# Patient Record
Sex: Male | Born: 1950 | Race: White | Hispanic: No | Marital: Married | Smoking: Never smoker
Health system: Southern US, Community
[De-identification: ages and names within clinical notes are randomized; demographics above are authoritative.]

## PROBLEM LIST (undated history)

## (undated) DIAGNOSIS — F329 Major depressive disorder, single episode, unspecified: Secondary | ICD-10-CM

## (undated) DIAGNOSIS — J449 Chronic obstructive pulmonary disease, unspecified: Secondary | ICD-10-CM

## (undated) DIAGNOSIS — I255 Ischemic cardiomyopathy: Secondary | ICD-10-CM

## (undated) DIAGNOSIS — R739 Hyperglycemia, unspecified: Secondary | ICD-10-CM

## (undated) DIAGNOSIS — I219 Acute myocardial infarction, unspecified: Secondary | ICD-10-CM

## (undated) DIAGNOSIS — T8859XA Other complications of anesthesia, initial encounter: Secondary | ICD-10-CM

## (undated) DIAGNOSIS — T4145XA Adverse effect of unspecified anesthetic, initial encounter: Secondary | ICD-10-CM

## (undated) DIAGNOSIS — E669 Obesity, unspecified: Secondary | ICD-10-CM

## (undated) DIAGNOSIS — I209 Angina pectoris, unspecified: Secondary | ICD-10-CM

## (undated) DIAGNOSIS — M1990 Unspecified osteoarthritis, unspecified site: Secondary | ICD-10-CM

## (undated) DIAGNOSIS — I4510 Unspecified right bundle-branch block: Secondary | ICD-10-CM

## (undated) DIAGNOSIS — F32A Depression, unspecified: Secondary | ICD-10-CM

## (undated) DIAGNOSIS — Z8679 Personal history of other diseases of the circulatory system: Secondary | ICD-10-CM

## (undated) DIAGNOSIS — R001 Bradycardia, unspecified: Secondary | ICD-10-CM

## (undated) DIAGNOSIS — G4733 Obstructive sleep apnea (adult) (pediatric): Secondary | ICD-10-CM

## (undated) DIAGNOSIS — Z7902 Long term (current) use of antithrombotics/antiplatelets: Secondary | ICD-10-CM

## (undated) DIAGNOSIS — I10 Essential (primary) hypertension: Secondary | ICD-10-CM

## (undated) DIAGNOSIS — G4730 Sleep apnea, unspecified: Secondary | ICD-10-CM

## (undated) DIAGNOSIS — D649 Anemia, unspecified: Secondary | ICD-10-CM

## (undated) DIAGNOSIS — I2510 Atherosclerotic heart disease of native coronary artery without angina pectoris: Secondary | ICD-10-CM

## (undated) DIAGNOSIS — C4491 Basal cell carcinoma of skin, unspecified: Secondary | ICD-10-CM

## (undated) DIAGNOSIS — E876 Hypokalemia: Secondary | ICD-10-CM

## (undated) DIAGNOSIS — Z87442 Personal history of urinary calculi: Secondary | ICD-10-CM

## (undated) DIAGNOSIS — E785 Hyperlipidemia, unspecified: Secondary | ICD-10-CM

## (undated) DIAGNOSIS — I509 Heart failure, unspecified: Secondary | ICD-10-CM

## (undated) DIAGNOSIS — H409 Unspecified glaucoma: Secondary | ICD-10-CM

## (undated) DIAGNOSIS — N400 Enlarged prostate without lower urinary tract symptoms: Secondary | ICD-10-CM

## (undated) HISTORY — DX: Major depressive disorder, single episode, unspecified: F32.9

## (undated) HISTORY — PX: CORONARY ANGIOPLASTY: SHX604

## (undated) HISTORY — DX: Depression, unspecified: F32.A

## (undated) HISTORY — DX: Basal cell carcinoma of skin, unspecified: C44.91

## (undated) HISTORY — PX: JOINT REPLACEMENT: SHX530

## (undated) HISTORY — PX: LAPAROSCOPIC GASTRIC RESTRICTIVE DUODENAL PROCEDURE (DUODENAL SWITCH): SHX6667

## (undated) HISTORY — DX: Sleep apnea, unspecified: G47.30

## (undated) HISTORY — PX: CARDIAC CATHETERIZATION: SHX172

## (undated) HISTORY — DX: Unspecified glaucoma: H40.9

## (undated) HISTORY — PX: OTHER SURGICAL HISTORY: SHX169

## (undated) HISTORY — DX: Essential (primary) hypertension: I10

## (undated) HISTORY — DX: Acute myocardial infarction, unspecified: I21.9

---
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
## 2006-07-09 DIAGNOSIS — I10 Essential (primary) hypertension: Secondary | ICD-10-CM | POA: Insufficient documentation

## 2006-07-09 DIAGNOSIS — R0902 Hypoxemia: Secondary | ICD-10-CM | POA: Insufficient documentation

## 2008-11-17 ENCOUNTER — Ambulatory Visit: Payer: Self-pay | Admitting: Specialist

## 2008-12-02 ENCOUNTER — Ambulatory Visit: Payer: Self-pay | Admitting: Specialist

## 2010-07-25 ENCOUNTER — Ambulatory Visit: Payer: Self-pay | Admitting: Family Medicine

## 2012-02-04 HISTORY — PX: CORONARY STENT PLACEMENT: SHX1402

## 2012-02-26 ENCOUNTER — Emergency Department: Payer: Self-pay | Admitting: Emergency Medicine

## 2012-02-26 DIAGNOSIS — I272 Pulmonary hypertension, unspecified: Secondary | ICD-10-CM

## 2012-02-26 DIAGNOSIS — I213 ST elevation (STEMI) myocardial infarction of unspecified site: Secondary | ICD-10-CM

## 2012-02-26 DIAGNOSIS — I10 Essential (primary) hypertension: Secondary | ICD-10-CM | POA: Insufficient documentation

## 2012-02-26 DIAGNOSIS — J449 Chronic obstructive pulmonary disease, unspecified: Secondary | ICD-10-CM | POA: Insufficient documentation

## 2012-02-26 DIAGNOSIS — Z8679 Personal history of other diseases of the circulatory system: Secondary | ICD-10-CM

## 2012-02-26 DIAGNOSIS — I2510 Atherosclerotic heart disease of native coronary artery without angina pectoris: Secondary | ICD-10-CM | POA: Insufficient documentation

## 2012-02-26 HISTORY — DX: ST elevation (STEMI) myocardial infarction of unspecified site: I21.3

## 2012-02-26 HISTORY — DX: Personal history of other diseases of the circulatory system: Z86.79

## 2012-02-26 HISTORY — DX: Pulmonary hypertension, unspecified: I27.20

## 2012-02-26 LAB — COMPREHENSIVE METABOLIC PANEL
Albumin: 3.5 g/dL (ref 3.4–5.0)
Anion Gap: 8 (ref 7–16)
BUN: 19 mg/dL — ABNORMAL HIGH (ref 7–18)
Bilirubin,Total: 0.3 mg/dL (ref 0.2–1.0)
Calcium, Total: 8.9 mg/dL (ref 8.5–10.1)
Chloride: 94 mmol/L — ABNORMAL LOW (ref 98–107)
Co2: 37 mmol/L — ABNORMAL HIGH (ref 21–32)
Creatinine: 0.68 mg/dL (ref 0.60–1.30)
EGFR (African American): >60
EGFR (Non-African Amer.): >60
Glucose: 139 mg/dL — ABNORMAL HIGH (ref 65–99)
Osmolality: 282 (ref 275–301)
Potassium: 2.8 mmol/L — ABNORMAL LOW (ref 3.5–5.1)
SGPT (ALT): 27 U/L (ref 12–78)
Sodium: 139 mmol/L (ref 136–145)
Total Protein: 8.3 g/dL — ABNORMAL HIGH (ref 6.4–8.2)

## 2012-02-26 LAB — TROPONIN I: Troponin-I: <0.02 ng/mL

## 2012-02-26 LAB — CBC
HCT: 44.9 % (ref 40.0–52.0)
HGB: 14.7 g/dL (ref 13.0–18.0)
MCH: 28.1 pg (ref 26.0–34.0)
MCHC: 32.7 g/dL (ref 32.0–36.0)
MCV: 86 fL (ref 80–100)
RDW: 14 % (ref 11.5–14.5)
WBC: 11.7 10*3/uL — ABNORMAL HIGH (ref 3.8–10.6)

## 2012-03-14 DIAGNOSIS — I4901 Ventricular fibrillation: Secondary | ICD-10-CM | POA: Insufficient documentation

## 2012-03-14 DIAGNOSIS — M1990 Unspecified osteoarthritis, unspecified site: Secondary | ICD-10-CM | POA: Insufficient documentation

## 2012-03-14 DIAGNOSIS — H409 Unspecified glaucoma: Secondary | ICD-10-CM | POA: Insufficient documentation

## 2012-04-04 ENCOUNTER — Ambulatory Visit: Payer: Self-pay

## 2012-06-04 ENCOUNTER — Emergency Department: Payer: Self-pay | Admitting: Emergency Medicine

## 2012-06-04 LAB — COMPREHENSIVE METABOLIC PANEL
Alkaline Phosphatase: 69 U/L (ref 50–136)
Anion Gap: 4 — ABNORMAL LOW (ref 7–16)
BUN: 16 mg/dL (ref 7–18)
Bilirubin,Total: 0.3 mg/dL (ref 0.2–1.0)
Calcium, Total: 8.5 mg/dL (ref 8.5–10.1)
Chloride: 105 mmol/L (ref 98–107)
Creatinine: 0.62 mg/dL (ref 0.60–1.30)
EGFR (African American): >60
Osmolality: 281 (ref 275–301)
Potassium: 4 mmol/L (ref 3.5–5.1)
SGPT (ALT): 20 U/L (ref 12–78)
Sodium: 140 mmol/L (ref 136–145)
Total Protein: 7 g/dL (ref 6.4–8.2)

## 2012-06-04 LAB — CBC
MCH: 28.8 pg (ref 26.0–34.0)
MCHC: 33.1 g/dL (ref 32.0–36.0)
Platelet: 206 10*3/uL (ref 150–440)
RBC: 4.52 10*6/uL (ref 4.40–5.90)
RDW: 14.4 % (ref 11.5–14.5)
WBC: 7.8 10*3/uL (ref 3.8–10.6)

## 2012-06-04 LAB — URINALYSIS, COMPLETE
Glucose,UR: NEGATIVE mg/dL (ref 0–75)
Leukocyte Esterase: NEGATIVE
Nitrite: NEGATIVE
Ph: 5 (ref 4.5–8.0)
Protein: 30
RBC,UR: 1 /HPF (ref 0–5)
WBC UR: 2 /HPF (ref 0–5)

## 2012-07-31 ENCOUNTER — Other Ambulatory Visit: Payer: Self-pay | Admitting: Pain Medicine

## 2012-07-31 ENCOUNTER — Ambulatory Visit: Payer: Self-pay | Admitting: Pain Medicine

## 2012-07-31 LAB — BASIC METABOLIC PANEL
Anion Gap: 5 — ABNORMAL LOW (ref 7–16)
Calcium, Total: 8.8 mg/dL (ref 8.5–10.1)
Creatinine: 0.46 mg/dL — ABNORMAL LOW (ref 0.60–1.30)
EGFR (Non-African Amer.): >60
Osmolality: 274 (ref 275–301)
Sodium: 136 mmol/L (ref 136–145)

## 2012-07-31 LAB — SEDIMENTATION RATE: Erythrocyte Sed Rate: 15 mm/hr (ref 0–20)

## 2012-08-15 ENCOUNTER — Ambulatory Visit: Payer: Self-pay | Admitting: Pain Medicine

## 2012-09-02 ENCOUNTER — Ambulatory Visit: Payer: Self-pay | Admitting: Pain Medicine

## 2012-11-13 ENCOUNTER — Ambulatory Visit: Payer: Self-pay | Admitting: Internal Medicine

## 2013-01-21 ENCOUNTER — Ambulatory Visit: Payer: Self-pay | Admitting: Specialist

## 2013-01-21 LAB — CBC WITH DIFFERENTIAL/PLATELET
Basophil #: 0.1 x10 3/mm 3
Basophil %: 0.9 %
Eosinophil #: 0.2 x10 3/mm 3
Eosinophil %: 1.9 %
HCT: 41.3 %
HGB: 14.1 g/dL
Lymphocyte %: 20.9 %
Lymphs Abs: 2 x10 3/mm 3
MCH: 28.9 pg
MCHC: 34 g/dL
MCV: 85 fL
Monocyte #: 0.7 "x10 3/mm "
Monocyte %: 7.1 %
Neutrophil #: 6.7 x10 3/mm 3 — ABNORMAL HIGH
Neutrophil %: 69.2 %
Platelet: 254 x10 3/mm 3
RBC: 4.87 x10 6/mm 3
RDW: 14.4 %
WBC: 9.7 x10 3/mm 3

## 2013-01-21 LAB — PROTIME-INR: INR: 1

## 2013-01-21 LAB — COMPREHENSIVE METABOLIC PANEL
BUN: 9 mg/dL (ref 7–18)
Bilirubin,Total: 0.4 mg/dL (ref 0.2–1.0)
Chloride: 101 mmol/L (ref 98–107)
Creatinine: 0.49 mg/dL — ABNORMAL LOW (ref 0.60–1.30)
Glucose: 98 mg/dL (ref 65–99)
Potassium: 3.9 mmol/L (ref 3.5–5.1)

## 2013-01-21 LAB — IRON AND TIBC
Iron Bind.Cap.(Total): 314 ug/dL (ref 250–450)
Unbound Iron-Bind.Cap.: 255 ug/dL

## 2013-01-21 LAB — PHOSPHORUS: Phosphorus: 3.5 mg/dL

## 2013-01-21 LAB — TSH: Thyroid Stimulating Horm: 2.16 u[IU]/mL

## 2013-01-21 LAB — HEMOGLOBIN A1C: Hemoglobin A1C: 5.7 %

## 2013-01-21 LAB — FOLATE: Folic Acid: 7.8 ng/mL

## 2013-01-21 LAB — AMYLASE: Amylase: 35 U/L (ref 25–115)

## 2013-01-21 LAB — BILIRUBIN, DIRECT: Bilirubin, Direct: 0.1 mg/dL (ref 0.00–0.20)

## 2013-01-21 LAB — APTT: Activated PTT: 28.5 secs (ref 23.6–35.9)

## 2013-02-06 ENCOUNTER — Ambulatory Visit: Payer: Self-pay | Admitting: Specialist

## 2013-03-05 ENCOUNTER — Ambulatory Visit: Payer: Self-pay | Admitting: Specialist

## 2013-08-21 HISTORY — PX: LAPAROSCOPIC GASTRIC SLEEVE RESECTION: SHX5895

## 2013-09-10 ENCOUNTER — Ambulatory Visit: Payer: Self-pay | Admitting: Specialist

## 2013-10-03 ENCOUNTER — Ambulatory Visit: Payer: Self-pay | Admitting: Specialist

## 2013-11-25 ENCOUNTER — Ambulatory Visit: Payer: Self-pay | Admitting: Gastroenterology

## 2014-07-08 DIAGNOSIS — E669 Obesity, unspecified: Secondary | ICD-10-CM | POA: Insufficient documentation

## 2014-07-08 DIAGNOSIS — Z9884 Bariatric surgery status: Secondary | ICD-10-CM | POA: Insufficient documentation

## 2014-08-31 ENCOUNTER — Encounter
Admit: 2014-08-31 | Disposition: A | Discharge status: Home / Self Care | Payer: Self-pay | Attending: Family Medicine | Admitting: Family Medicine

## 2014-09-04 ENCOUNTER — Encounter
Admit: 2014-09-04 | Disposition: A | Discharge status: Home / Self Care | Payer: Self-pay | Attending: Family Medicine | Admitting: Family Medicine

## 2014-11-30 DIAGNOSIS — I252 Old myocardial infarction: Secondary | ICD-10-CM | POA: Insufficient documentation

## 2014-11-30 DIAGNOSIS — M546 Pain in thoracic spine: Secondary | ICD-10-CM | POA: Insufficient documentation

## 2014-11-30 DIAGNOSIS — Z8659 Personal history of other mental and behavioral disorders: Secondary | ICD-10-CM | POA: Insufficient documentation

## 2014-11-30 DIAGNOSIS — J449 Chronic obstructive pulmonary disease, unspecified: Secondary | ICD-10-CM | POA: Insufficient documentation

## 2014-11-30 DIAGNOSIS — E668 Other obesity: Secondary | ICD-10-CM | POA: Insufficient documentation

## 2014-11-30 DIAGNOSIS — M179 Osteoarthritis of knee, unspecified: Secondary | ICD-10-CM | POA: Insufficient documentation

## 2014-11-30 DIAGNOSIS — M545 Low back pain, unspecified: Secondary | ICD-10-CM | POA: Insufficient documentation

## 2014-11-30 DIAGNOSIS — E669 Obesity, unspecified: Secondary | ICD-10-CM | POA: Insufficient documentation

## 2014-11-30 DIAGNOSIS — E785 Hyperlipidemia, unspecified: Secondary | ICD-10-CM | POA: Insufficient documentation

## 2014-11-30 DIAGNOSIS — J42 Unspecified chronic bronchitis: Secondary | ICD-10-CM | POA: Insufficient documentation

## 2014-11-30 DIAGNOSIS — M1710 Unilateral primary osteoarthritis, unspecified knee: Secondary | ICD-10-CM | POA: Insufficient documentation

## 2014-11-30 DIAGNOSIS — B356 Tinea cruris: Secondary | ICD-10-CM | POA: Insufficient documentation

## 2014-12-01 ENCOUNTER — Encounter: Payer: Self-pay | Admitting: Family Medicine

## 2014-12-01 ENCOUNTER — Ambulatory Visit (INDEPENDENT_AMBULATORY_CARE_PROVIDER_SITE_OTHER): Payer: Medicare Other | Admitting: Family Medicine

## 2014-12-01 VITALS — BP 178/74 | HR 64 | Temp 98.2°F | Resp 16 | Ht 67.0 in | Wt 310.0 lb

## 2014-12-01 DIAGNOSIS — I2510 Atherosclerotic heart disease of native coronary artery without angina pectoris: Secondary | ICD-10-CM | POA: Diagnosis not present

## 2014-12-01 DIAGNOSIS — J449 Chronic obstructive pulmonary disease, unspecified: Secondary | ICD-10-CM | POA: Diagnosis not present

## 2014-12-01 DIAGNOSIS — I252 Old myocardial infarction: Secondary | ICD-10-CM

## 2014-12-01 DIAGNOSIS — G4733 Obstructive sleep apnea (adult) (pediatric): Secondary | ICD-10-CM

## 2014-12-01 DIAGNOSIS — I4901 Ventricular fibrillation: Secondary | ICD-10-CM | POA: Diagnosis not present

## 2014-12-01 DIAGNOSIS — M1711 Unilateral primary osteoarthritis, right knee: Secondary | ICD-10-CM

## 2014-12-01 DIAGNOSIS — Z9989 Dependence on other enabling machines and devices: Secondary | ICD-10-CM

## 2014-12-01 DIAGNOSIS — Z9884 Bariatric surgery status: Secondary | ICD-10-CM | POA: Diagnosis not present

## 2014-12-01 NOTE — Progress Notes (Signed)
Subjective:    Patient ID: William Armstrong, male    DOB: 09-Jun-1950, 64 y.o.   MRN: [id number]  HPI Pt is here for a face to face for mobility examination and paper work to filled out regarding his wheel chair. He needs a new wheel chair because the one he has is 66 years old and is too big for him since he has lost 150lbs. Unable to walk very much due to severe osteoarthritis caused by long term severe obesity. Unadvisable to have orthopedic surgery due to COPD and severe obesity with hypoxia. Breathing is improved with weight loss. Has a history of MI September 2013 with stents by cardiologist (Dr. Laurance Flatten in [place]). Recent follow up with cardiologist showed improvement and he discontinued the Metoprolol. Ambulation further hindered by redundant loose skin from significant weight loss. Patient Active Problem List   Diagnosis Date Noted  . Chronic bronchitis 11/30/2014  . COPD, moderate 11/30/2014  . H/O: depression 11/30/2014  . Old myocardial infarction 11/30/2014  . HLD (hyperlipidemia) 11/30/2014  . Low back pain 11/30/2014  . Extreme obesity 11/30/2014  . Arthritis of knee, degenerative 11/30/2014  . Back pain, thoracic 11/30/2014  . Dermatophytosis of groin 11/30/2014  . Adiposity 07/08/2014  . Bariatric surgery status 07/08/2014  . Glaucoma 03/14/2012  . Arthritis, degenerative 03/14/2012  . Ventricular fibrillation 03/14/2012  . Arteriosclerosis of coronary artery 02/26/2012  . CAFL (chronic airflow limitation) 02/26/2012  . BP (high blood pressure) 02/26/2012  . Essential (primary) hypertension 07/09/2006  . Open-angle glaucoma 07/09/2006  . Hypoxemia 07/09/2006  . Obstructive apnea 07/09/2006   Past Surgical History  Procedure Laterality Date  . Tonsillectomy and adenoidectomy  1960  . Laparoscopic gastric sleeve resection  08/21/2013  . Coronary stent placement  02/2012   History  Substance Use Topics  . Smoking status: Never Smoker   . Smokeless tobacco: Not on file   . Alcohol Use: 0.0 oz/week    0 Standard drinks or equivalent per week     Comment: OCCASIONALLY   Family History  Problem Relation Age of Onset  . Heart attack Mother   . Brain cancer Father   . Heart attack Sister   . Congenital heart disease Sister   . Leukemia Paternal Grandmother   . COPD Brother   . Heart disease Brother    Current Outpatient Prescriptions on File Prior to Visit  Medication Sig Dispense Refill  . Aspirin 81 MG EC tablet 1 tablet daily.    . bimatoprost (LUMIGAN) 0.01 % SOLN Apply to eye.    . Cholecalciferol (VITAMIN D) 2000 UNITS tablet Take 1 tablet by mouth daily.    . clotrimazole-betamethasone (LOTRISONE) cream 1 application 2 (two) times daily.    . dorzolamide (TRUSOPT) 2 % ophthalmic solution Apply 1 drop to eye 3 (three) times daily.    . rosuvastatin (CRESTOR) 10 MG tablet Take 1 tablet by mouth daily.    . sertraline (ZOLOFT) 100 MG tablet Take 1 tablet by mouth daily.     No current facility-administered medications on file prior to visit.   Allergies  Allergen Reactions  . Atorvastatin Other (See Comments)  . Pregabalin Other (See Comments)   Review of Systems  Constitutional: Negative.        Morbid obesity and poor activity level.  HENT: Negative.   Eyes: Negative.        Continues eye drops for glaucoma and well controlled.  Respiratory: Positive for shortness of breath. Negative for cough and  wheezing.        Occurs with any attempt to walk and controlled hypoxia with oxygen at 2LPM during the day and BiPAP with oxygen bleed in at 2LPM nightly.  Cardiovascular: Negative.   Gastrointestinal: Negative.   Genitourinary: Negative.   Musculoskeletal: Positive for arthralgias.       Persistent right knee pain due to severe degenerative disease from severe morbid obesity.  Neurological: Negative.   Hematological: Negative.   Psychiatric/Behavioral: Negative.       BP 178/74 mmHg  Pulse 64  Temp(Src) 98.2 F (36.8 C) (Oral)  Resp  16  Wt 310 lb (140.615 kg) Body mass index is 48.54 kg/(m^2).  Objective:   Physical Exam  Constitutional:  Severe morbid obesity.  HENT:  Head: Normocephalic and atraumatic.  Right Ear: External ear normal.  Left Ear: External ear normal.  Nose: Nose normal.  Mouth/Throat: Oropharynx is clear and moist.  Eyes: Conjunctivae and EOM are normal. Pupils are equal, round, and reactive to light.  Neck: Normal range of motion. Neck supple.  Cardiovascular: Normal rate, regular rhythm and normal heart sounds.   Pulmonary/Chest: Breath sounds normal.  On oxygen at 2 LPM by nasal cannula day and night  Abdominal: Soft. Bowel sounds are normal.  Morbidly obese  Musculoskeletal: He exhibits tenderness.  Left knee has some tenderness with crepitus. Difficult to fully evaluate due to obesity. Decreased full flexion. Sharper and more intense pain with weight bearing.  Neurological: He is alert.  Skin: Skin is warm and dry.  Psychiatric: He has a normal mood and affect. His behavior is normal. Thought content normal.      Assessment & Plan:  1. Primary osteoarthritis of right knee Unable to walk much more than trips to the bathroom and getting into his vehicle. Continues to use wheelchair for all ambulation but difficult due to weight loss making chair too big now. Needs repair or replacement. Will send report and forms to [employer]. Completed Disability [employer] application form in March 2016. Mobility related activity limitations include inability to walk through the house to go to the bathroom without motorized chair. Morbid obesity makes it impossible to use a manual wheelchair. Cannot use scooter in the home and has had a motorized chair for years. He is mentally and physically able and motivated to use powerized wheelchair.  2. COPD, moderate Diagnosed in 2003 with acute hypoxia and pulse oximetry 80%. Still on 2 LPM during the day and 2 LPM bleed-in through BiPAP at night. Pulse  oximetry 97% with oxygen initially - dropped to 95% at rest without oxygen - could only walk a very short distance due to obesity and pulse elevation to 171 with pulse oximetry drop to 91% without oxygen - rebounded back to 94% after walking and oxygen. Recommend continuation of oxygen at 2 LPM and BiPAP with 2 LPM bleed-in at night. Recheck in 3 months.  3. OSA on CPAP Energy level improved with BiPAP with 2 LPM oxygen bleed-in each night. Not having a great deal of daytime sleepiness now.  4. Old myocardial infarction Had STEMI 02-26-12. Had 2 stents placed in the RCA foe 99% occlusion. No recent chest pain with weight loss and oxygen use.  5. Ventricular fibrillation Occurred  02-26-12 at [facility] during cardiac cath for stents. Required defibrillation 3 times and was treated with Amiodarone during the hospitalization only. No recurrences and followed by Dr. Laurance Flatten (cardiologist).  6. Arteriosclerosis of coronary artery No recurrence of angina. Dyspnea due to severe obesity  and COPD with chronic hypoxia. History of CHF that has improved with weight loss and treatment of OSA. Follow up with cardiologist (Dr. Laurance Flatten) routinely as planned.  7. Bariatric surgery status Had gastric sleeve surgery 08-21-13. Maximum weight was 460 lbs at time of MI on 02-26-12. Has lost 140 lbs and goal is <250 lbs. Orthopedist will not operate on the left knee (which severely limits mobility) until weight loss goal is achieved.

## 2014-12-04 ENCOUNTER — Telehealth: Payer: Self-pay | Admitting: Family Medicine

## 2014-12-10 NOTE — Telephone Encounter (Signed)
Pt's wife Lorre Nick called back about the forms for the power wheelchair. Thanks TNP

## 2014-12-10 NOTE — Telephone Encounter (Signed)
Pt's wife Lorre Nick would like a call back because she would like an update on the forms they need filled out to get a new power wheelchair. Lorre Nick wanted to know if they were ready or when they might be ready. Thanks TNP

## 2014-12-11 ENCOUNTER — Telehealth: Payer: Self-pay | Admitting: Family Medicine

## 2014-12-11 NOTE — Telephone Encounter (Signed)
Left patient a voicemail informing him that the forms have been faxed back to [employer].

## 2014-12-11 NOTE — Telephone Encounter (Signed)
Pt's wife Lorre Nick called because she was advised that we had faxed the forms for the wheelchair to [employer] Fax# [phone] but she spoke with that office and was advised that they didn't receive the fax. Chrys Racer found the forms that were faxed on 12/10/14 and is going to fax them again. Lorre Nick was advised and request that she be able to pick up a copy so she can take them to [employer] just in case they do not get the fax. Thanks TNP Thanks TNP

## 2014-12-11 NOTE — Telephone Encounter (Signed)
Advise patient (or wife) that forms and therapist report was faxed to [employer] regarding power chair on 12-10-14.

## 2014-12-15 ENCOUNTER — Telehealth: Payer: Self-pay | Admitting: Family Medicine

## 2014-12-15 NOTE — Telephone Encounter (Signed)
Contacted Brooke with [employer] and she stated that she received the paper work for patient's wheel chair but one sheet was missing a signature. Jerene Pitch will fax that page back to be signed by Simona Huh.

## 2014-12-15 NOTE — Telephone Encounter (Signed)
Brooke with [employer] is requesting a call back to discuss info about pt power wheel chair.  BR#[phone]/FT

## 2014-12-20 ENCOUNTER — Encounter: Payer: Self-pay | Admitting: Family Medicine

## 2014-12-22 ENCOUNTER — Telehealth: Payer: Self-pay | Admitting: Family Medicine

## 2014-12-22 NOTE — Telephone Encounter (Signed)
Brooke calling in regards to the order for his power wheel chair from [employer].  Could some one please call her aback at [phone].   Thanks, Con Memos

## 2014-12-24 ENCOUNTER — Encounter: Payer: Self-pay | Admitting: Family Medicine

## 2014-12-24 ENCOUNTER — Ambulatory Visit (INDEPENDENT_AMBULATORY_CARE_PROVIDER_SITE_OTHER): Payer: Medicare Other | Admitting: Family Medicine

## 2014-12-24 VITALS — BP 136/70 | HR 108 | Temp 98.0°F | Resp 16 | Ht 67.5 in | Wt 305.0 lb

## 2014-12-24 DIAGNOSIS — I2510 Atherosclerotic heart disease of native coronary artery without angina pectoris: Secondary | ICD-10-CM

## 2014-12-24 DIAGNOSIS — J449 Chronic obstructive pulmonary disease, unspecified: Secondary | ICD-10-CM | POA: Diagnosis not present

## 2014-12-24 DIAGNOSIS — G4733 Obstructive sleep apnea (adult) (pediatric): Secondary | ICD-10-CM | POA: Diagnosis not present

## 2014-12-24 NOTE — Progress Notes (Addendum)
Subjective:    Patient ID: William Armstrong, male    DOB: Jul 07, 1950, 64 y.o.   MRN: [id number] Chief Complaint  Patient presents with  . Follow-up    Oxigen , currently on 2 - 3 L of O2    HPI  This 64 year old male with extreme obesity, COPD with history of OSA and CAD with episode of V.fib during stent placement is concerned about last pulse oximetry reading being so good he may not qualify to continue oxygen therapy 2-3 LPM with bleed-in on CPAP. Wants to recheck oxygen levels with and with out oxygen supplement at rest and with walking. Very difficult to walk with his extreme obesity and severe osteoarthritis in the right knee. Physical activities have been hindered for years and requiring motorized wheel chair usage. Endurance is very poor.  No past medical history on file. Patient Active Problem List   Diagnosis Date Noted  . Chronic bronchitis 11/30/2014  . COPD, moderate 11/30/2014  . H/O: depression 11/30/2014  . Old myocardial infarction 11/30/2014  . HLD (hyperlipidemia) 11/30/2014  . Low back pain 11/30/2014  . Extreme obesity 11/30/2014  . Arthritis of knee, degenerative 11/30/2014  . Back pain, thoracic 11/30/2014  . Dermatophytosis of groin 11/30/2014  . Adiposity 07/08/2014  . Bariatric surgery status 07/08/2014  . Glaucoma 03/14/2012  . Arthritis, degenerative 03/14/2012  . Ventricular fibrillation 03/14/2012  . Arteriosclerosis of coronary artery 02/26/2012  . CAFL (chronic airflow limitation) 02/26/2012  . BP (high blood pressure) 02/26/2012  . Essential (primary) hypertension 07/09/2006  . Open-angle glaucoma 07/09/2006  . Hypoxemia 07/09/2006  . Obstructive apnea 07/09/2006   Past Surgical History  Procedure Laterality Date  . Tonsillectomy and adenoidectomy  1960  . Laparoscopic gastric sleeve resection  08/21/2013  . Coronary stent placement  02/2012   History  Substance Use Topics  . Smoking status: Never Smoker   . Smokeless tobacco: Not on file   . Alcohol Use: 0.0 oz/week    0 Standard drinks or equivalent per week     Comment: OCCASIONALLY   Family History  Problem Relation Age of Onset  . Heart attack Mother   . Brain cancer Father   . Heart attack Sister   . Congenital heart disease Sister   . Leukemia Paternal Grandmother   . COPD Brother   . Heart disease Brother    Current Outpatient Prescriptions on File Prior to Visit  Medication Sig Dispense Refill  . Aspirin 81 MG EC tablet 1 tablet daily.    . bimatoprost (LUMIGAN) 0.01 % SOLN Apply to eye.    . Cholecalciferol (VITAMIN D) 2000 UNITS tablet Take 1 tablet by mouth daily.    . clotrimazole-betamethasone (LOTRISONE) cream 1 application 2 (two) times daily.    . dorzolamide (TRUSOPT) 2 % ophthalmic solution Apply 1 drop to eye 3 (three) times daily.    . rosuvastatin (CRESTOR) 10 MG tablet Take 1 tablet by mouth daily.    . sertraline (ZOLOFT) 100 MG tablet Take 1 tablet by mouth daily.     No current facility-administered medications on file prior to visit.   Allergies  Allergen Reactions  . Atorvastatin Other (See Comments)  . Pregabalin Other (See Comments)    Review of Systems  Constitutional: Negative.   HENT: Negative.   Eyes: Negative.   Respiratory: Positive for shortness of breath.   Cardiovascular: Negative.   Gastrointestinal: Negative.   Musculoskeletal: Positive for arthralgias.       Severe pain  in the right knee with trying to bear weight and flexing.     BP 136/70 mmHg  Pulse 66  Temp(Src) 98 F (36.7 C) (Oral)  Resp 16  Ht 5' 7.5" (1.715 m)  Wt 305 lb (138.347 kg)  BMI 47.04 kg/m2  SpO2 95%  Objective:   Physical Exam  Constitutional: He is oriented to person, place, and time. He appears well-developed and well-nourished. No distress.  HENT:  Head: Normocephalic and atraumatic.  Right Ear: Hearing normal.  Left Ear: Hearing normal.  Nose: Nose normal.  Eyes: Conjunctivae and lids are normal. Right eye exhibits no  discharge. Left eye exhibits no discharge. No scleral icterus.  Pulmonary/Chest: Effort normal. No respiratory distress.  No distress using oxygen by nasal cannula continuously.  Neurological: He is alert and oriented to person, place, and time.  Skin: Skin is intact. No lesion and no rash noted.  Psychiatric: He has a normal mood and affect. His speech is normal and behavior is normal. Thought content normal.      Assessment & Plan:  1. COPD, moderate Dyspnea extreme with exertion. Resting on  2 LPM by nasal cannula pulse oximetry 95% with pulse at 50-60 BPM. Walking very short distance without oxygen pulse oximetry drops to 88% with heart rate elevating to 100 BPM. Walking with oxygen at 2LPM pulse oximetry 95% with pulse 99 BPM. At rest pulse oximetry will return to 97% with pulse 53 BPM on 2LPM of oxygen. Recommend he continue present oxygen therapy and proceed with further weight loss.  2. Obstructive apnea States he was changed from BiPAP with O2 bleed-in to CPAP with O2 bleed-in at night. Still using oxygen by nasal cannula 2-3 LPM 24 hours a day. Extreme shortness of breath when he tries to walk without oxygen. Suspect due to extreme obesity, COPD and poor endurance since he has CAD and osteoarthritis in the left knee limiting his physical abilities further. Recommend he continue the oxygen for hypoxia during sleep and with exertion.   ADDENDUM:  Resting pulse oximetry on 02-16-15 without oxygen was 94%. As stated above pulse oximetry was 95 % on 2 LPM oxygen by nasal cannula with pulse rate of 50-60 BPM at rest. Walking wiithout oxygen, it dropped to 88% with pulse of 100 BPM. Walking with oxygen at 2 LPM, it was 95% with pulse of 99 BPM.

## 2014-12-25 NOTE — Telephone Encounter (Signed)
Is this something that was discussed with you already? Wanted to make sure before calling. Thank you-aa

## 2014-12-25 NOTE — Telephone Encounter (Signed)
LMTCB on voicemail from [employer] in regards to what they need.-aa

## 2014-12-28 ENCOUNTER — Other Ambulatory Visit: Payer: Self-pay | Admitting: Family Medicine

## 2014-12-28 MED ORDER — SERTRALINE HCL 100 MG PO TABS: 100.0000 mg | ORAL_TABLET | Freq: Every day | ORAL | Status: DC

## 2014-12-28 NOTE — Telephone Encounter (Signed)
See below-aa 

## 2014-12-28 NOTE — Telephone Encounter (Signed)
Talked to [facility] with [employer]. William Armstrong mentioned that they need a portion of the Therapist evaluation to be signed and date for the same date as the rest of the paper work that was filled and signed for his power wheel chair. Awaiting for paperwork to be faxed to the [facility].

## 2014-12-29 NOTE — Telephone Encounter (Signed)
Dennis receive paperwork to sign today. Will fax back to [employer] once he signs the paperwork.

## 2015-01-01 ENCOUNTER — Telehealth: Payer: Self-pay

## 2015-01-01 NOTE — Telephone Encounter (Signed)
Brook from [employer] called regarding William Armstrong paperwork for the power wheel chair, per [place] paper work have not been receive. Brook's phone number [phone] Fax# [phone] Crissie Reese the information to re-fax sign forms.  Thanks,   -Triva Hueber

## 2015-01-06 DIAGNOSIS — J9611 Chronic respiratory failure with hypoxia: Secondary | ICD-10-CM | POA: Insufficient documentation

## 2015-02-15 ENCOUNTER — Telehealth: Payer: Self-pay | Admitting: Family Medicine

## 2015-02-15 NOTE — Telephone Encounter (Signed)
Pt's wife called back stating the [employer] called her and that [employer] have received the forms they were looking for.CC

## 2015-02-15 NOTE — Telephone Encounter (Signed)
Returned call to patient. Received a letter from [employer] that they need patient's resting O2 without oxygen on. Patient will come by the office on Tuesday to get a pulse oximetry.

## 2015-02-15 NOTE — Telephone Encounter (Signed)
Pt states he is returning call.  CB#[phone]/MW

## 2015-02-15 NOTE — Telephone Encounter (Signed)
Pt's wife called wanting to know the update on the forms for a power wheel chair from [employer]. CB# [phone]. CC

## 2015-02-22 ENCOUNTER — Telehealth: Payer: Self-pay | Admitting: Family Medicine

## 2015-02-22 NOTE — Telephone Encounter (Signed)
Pt wife called to see if the form has been completed and ready to pick up.  CB#[phone]/MW

## 2015-02-22 NOTE — Telephone Encounter (Deleted)
Pt's wife Lorre Nick) calling wanting a up date on the [facility] forms for pt's oxygen. Wife states this needs to be done ASAP for his oxygen.  CC

## 2015-02-22 NOTE — Telephone Encounter (Signed)
Pt's wife Lorre Nick calling to see if the [facility] care form is completed for pt's oxygen. Wife states it needs to be done ASAP for pt to continue to get his oxygen.

## 2015-02-22 NOTE — Telephone Encounter (Signed)
Patient's wife Lorre Nick advised form has been faxed back to [facility].

## 2015-03-08 ENCOUNTER — Ambulatory Visit (INDEPENDENT_AMBULATORY_CARE_PROVIDER_SITE_OTHER): Payer: Medicare Other | Admitting: Family Medicine

## 2015-03-08 ENCOUNTER — Other Ambulatory Visit: Payer: Self-pay

## 2015-03-08 ENCOUNTER — Encounter: Payer: Self-pay | Admitting: Family Medicine

## 2015-03-08 VITALS — BP 138/72 | HR 52 | Temp 98.6°F | Resp 16

## 2015-03-08 DIAGNOSIS — G4733 Obstructive sleep apnea (adult) (pediatric): Secondary | ICD-10-CM | POA: Diagnosis not present

## 2015-03-08 DIAGNOSIS — J449 Chronic obstructive pulmonary disease, unspecified: Secondary | ICD-10-CM

## 2015-03-08 DIAGNOSIS — M1711 Unilateral primary osteoarthritis, right knee: Secondary | ICD-10-CM | POA: Diagnosis not present

## 2015-03-08 DIAGNOSIS — Z23 Encounter for immunization: Secondary | ICD-10-CM

## 2015-03-08 DIAGNOSIS — I2510 Atherosclerotic heart disease of native coronary artery without angina pectoris: Secondary | ICD-10-CM | POA: Diagnosis not present

## 2015-03-08 NOTE — Progress Notes (Signed)
Patient ID: William Armstrong, male   DOB: 1950/11/02, 64 y.o.   MRN: [id number]    Subjective:  HPI  Patient is requesting documentation to recertify for in home oxygen. Feeling well. Has lost 150 lbs since gastric sleeve with resection. Breathing well without congestion or wheeze. Will go without oxygen at home occasionally. Has started weaning off during the day and continues to use with CPAP each night. Still having to use power chair because of arthritic knee pain limiting ambulation.  Prior to Admission medications   Medication Sig Start Date End Date Taking? Authorizing Provider  Aspirin 81 MG EC tablet 1 tablet daily. 07/09/06  Yes Historical Provider, MD  bimatoprost (LUMIGAN) 0.01 % SOLN Apply to eye.   Yes Historical Provider, MD  Cholecalciferol (VITAMIN D) 2000 UNITS tablet Take 1 tablet by mouth daily.   Yes Historical Provider, MD  clotrimazole-betamethasone (LOTRISONE) cream 1 application 2 (two) times daily. 03/31/14  Yes Historical Provider, MD  dorzolamide (TRUSOPT) 2 % ophthalmic solution Apply 1 drop to eye 3 (three) times daily.   Yes Historical Provider, MD  rosuvastatin (CRESTOR) 10 MG tablet Take 1 tablet by mouth daily.   Yes Historical Provider, MD  sertraline (ZOLOFT) 100 MG tablet Take 1 tablet (100 mg total) by mouth daily. 12/28/14  Yes Dennis E Chrismon, PA  sildenafil (REVATIO) 20 MG tablet 5 tabs PO QD PRN for ED 01/08/15  Yes Historical Provider, MD    Patient Active Problem List   Diagnosis Date Noted  . Chronic respiratory failure with hypoxia ([place]) 01/06/2015  . Chronic bronchitis ([place]) 11/30/2014  . COPD, moderate ([place]) 11/30/2014  . H/O: depression 11/30/2014  . Old myocardial infarction 11/30/2014  . HLD (hyperlipidemia) 11/30/2014  . Low back pain 11/30/2014  . Extreme obesity ([place]) 11/30/2014  . Arthritis of knee, degenerative 11/30/2014  . Back pain, thoracic 11/30/2014  . Dermatophytosis of groin 11/30/2014  . Adiposity 07/08/2014  . Bariatric  surgery status 07/08/2014  . Morbid obesity ([place]) 08/21/2013  . Glaucoma 03/14/2012  . Arthritis, degenerative 03/14/2012  . Ventricular fibrillation ([place]) 03/14/2012  . Arteriosclerosis of coronary artery 02/26/2012  . CAFL (chronic airflow limitation) ([place]) 02/26/2012  . BP (high blood pressure) 02/26/2012  . Chronic obstructive pulmonary disease ([place]) 02/26/2012  . Essential (primary) hypertension 07/09/2006  . Open-angle glaucoma 07/09/2006  . Hypoxemia 07/09/2006  . Obstructive apnea 07/09/2006    Past Surgical History  Procedure Laterality Date  . Tonsillectomy and adenoidectomy  1960  . Laparoscopic gastric sleeve resection  08/21/2013  . Coronary stent placement  02/2012   Family History  Problem Relation Age of Onset  . Heart attack Mother   . Brain cancer Father   . Heart attack Sister   . Congenital heart disease Sister   . Leukemia Paternal Grandmother   . COPD Brother   . Heart disease Brother      Social History   Social History  . Marital Status: Married    Spouse Name: N/A  . Number of Children: N/A  . Years of Education: N/A   Occupational History  . Not on file.   Social History Main Topics  . Smoking status: Never Smoker   . Smokeless tobacco: Not on file  . Alcohol Use: 0.0 oz/week    0 Standard drinks or equivalent per week     Comment: OCCASIONALLY  . Drug Use: No  . Sexual Activity: Not on file   Other Topics Concern  . Not on file  Social History Narrative    Allergies  Allergen Reactions  . Atorvastatin Other (See Comments)  . Pregabalin Other (See Comments)    Review of Systems  Constitutional: Negative.   HENT: Negative.   Eyes: Negative.   Respiratory: Negative.        Less shortness of breath since losing 150 lbs after bariatric surgery. Less need for oxygen at home now.  Cardiovascular: Negative.   Gastrointestinal: Negative.   Genitourinary: Negative.   Musculoskeletal: Positive for joint pain.       Right knee    Skin: Negative.   Neurological: Negative.   Endo/Heme/Allergies: Negative.   Psychiatric/Behavioral: Negative.      Objective:  BP 138/72 mmHg  Pulse 52  Temp(Src) 98.6 F (37 C) (Oral)  Resp 16  SpO2 96%  Physical Exam  Constitutional: He is oriented to person, place, and time and well-developed, well-nourished, and in no distress.  With morbid obesity.  HENT:  Head: Normocephalic and atraumatic.  Eyes: Conjunctivae are normal.  Cardiovascular: Normal rate and regular rhythm.   Pulmonary/Chest: Effort normal and breath sounds normal.  Musculoskeletal: He exhibits tenderness.  Pain in right knee with any weight bearing. Trying to lose enough weight to have knee surgery. Some crepitus with test of ROM.  Neurological: He is alert and oriented to person, place, and time.  Psychiatric: Memory, affect and judgment normal.    Lab Results  Component Value Date   WBC 9.7 01/21/2013   HGB 14.1 01/21/2013   HCT 41.3 01/21/2013   PLT 254 01/21/2013   GLUCOSE 98 01/21/2013   INR 1.0 01/21/2013   HGBA1C 5.7 01/21/2013    CMP     Component Value Date/Time   NA 135* 01/21/2013 0925   K 3.9 01/21/2013 0925   CL 101 01/21/2013 0925   CO2 33* 01/21/2013 0925   GLUCOSE 98 01/21/2013 0925   BUN 9 01/21/2013 0925   CREATININE 0.49* 01/21/2013 0925   CALCIUM 9.1 01/21/2013 0925   PROT 7.6 01/21/2013 0925   ALBUMIN 3.5 01/21/2013 0925   AST 17 01/21/2013 0925   ALT 20 01/21/2013 0925   ALKPHOS 75 01/21/2013 0925   BILITOT 0.4 01/21/2013 0925   GFRNONAA >60 01/21/2013 0925   GFRAA >60 01/21/2013 0925    Assessment and Plan :  1. COPD, moderate (HCC) Improved breathing since losing 150 lbs after bariatric surgery. Has reached a plateau and surgeon is considering a duodenal switch procedure soon. Pulse oximetry 94% at rest without oxygen, 97% at rest with oxygen, 91% during short walk without oxygen and 96% during short walk with oxygen. May continue to wean back on oxygen use  during the day to only as needed. Recheck progress in 2 months. 2. Obstructive apnea Still on CPAP at bedtime with oxygen bleed-in. Sleeping well and having a little more energy. Needs to lose more weight. Will work toward CPAP without oxygen in the next 6-8 weeks.  3. Primary osteoarthritis of right knee Still unable to ambulate more than a few feet. Surgeon ready to do joint surgery when he loses more weight.  4. Need for influenza vaccination - Flu vaccine HIGH DOSE PF  5. Morbid obesity due to excess calories Had bariatric surgery in 2013 and has lost 150 lbs. Plateau of loss for a long time now and surgeon is considering additional duodenal switch procedure. Cardiologist will get echocardiogram (Dr. Laurance Flatten in [facility]) to follow up history of MI with ventricular fibrillation March 18, 2015.  Simona Huh Chrismon PAC  [facility] 03/08/2015 10:43 AM

## 2015-03-09 ENCOUNTER — Other Ambulatory Visit: Payer: Self-pay | Admitting: Specialist

## 2015-03-09 DIAGNOSIS — E7800 Pure hypercholesterolemia, unspecified: Secondary | ICD-10-CM

## 2015-04-27 ENCOUNTER — Other Ambulatory Visit: Payer: Self-pay | Admitting: Family Medicine

## 2015-04-27 MED ORDER — AZITHROMYCIN 250 MG PO TABS: ORAL_TABLET | ORAL | Status: DC

## 2015-05-10 ENCOUNTER — Encounter: Payer: Self-pay | Admitting: Family Medicine

## 2015-05-10 ENCOUNTER — Ambulatory Visit (INDEPENDENT_AMBULATORY_CARE_PROVIDER_SITE_OTHER): Payer: Medicare Other | Admitting: Family Medicine

## 2015-05-10 VITALS — BP 158/76 | HR 60 | Temp 98.0°F | Resp 14 | Wt 320.4 lb

## 2015-05-10 DIAGNOSIS — I2510 Atherosclerotic heart disease of native coronary artery without angina pectoris: Secondary | ICD-10-CM

## 2015-05-10 DIAGNOSIS — G4733 Obstructive sleep apnea (adult) (pediatric): Secondary | ICD-10-CM | POA: Diagnosis not present

## 2015-05-10 DIAGNOSIS — J449 Chronic obstructive pulmonary disease, unspecified: Secondary | ICD-10-CM | POA: Diagnosis not present

## 2015-05-10 NOTE — Progress Notes (Signed)
Patient ID: William Armstrong, male   DOB: [birth year], 64 y.o.   MRN: [id number]   . Chief Complaint  Patient presents with  . COPD  . Apnea  . Follow-up    Subjective:  HPI  Breathing better and no longer using the oxygen during the day. Will continue to use the concentrator with CPAP at night. Sleeping well and energy level good. Still having pain in the right knee due to arthritis and rarely walks any. Dr. Rudene Christians planning to replace joint after he loses weight down close to 250. Will see cardiologist today to follow up history of MI. Denies dyspnea or chest pains. Bariatric surgeon has rechecked progress 2 months ago and did follow up labs. Planning a modification surgery to help him lose more weight next year.   Prior to Admission medications   Medication Sig Start Date End Date Taking? Authorizing Provider  Aspirin 81 MG EC tablet 1 tablet daily. 07/09/06  Yes Historical Provider, MD  bimatoprost (LUMIGAN) 0.01 % SOLN Apply to eye.   Yes Historical Provider, MD  Cholecalciferol (VITAMIN D) 2000 UNITS tablet Take 1 tablet by mouth daily.   Yes Historical Provider, MD  clotrimazole-betamethasone (LOTRISONE) cream 1 application 2 (two) times daily. 03/31/14  Yes Historical Provider, MD  dorzolamide (TRUSOPT) 2 % ophthalmic solution Apply 1 drop to eye 3 (three) times daily.   Yes Historical Provider, MD  rosuvastatin (CRESTOR) 10 MG tablet Take 1 tablet by mouth daily.   Yes Historical Provider, MD  sertraline (ZOLOFT) 100 MG tablet Take 1 tablet (100 mg total) by mouth daily. 12/28/14  Yes Dennis E Chrismon, PA  sildenafil (REVATIO) 20 MG tablet 5 tabs PO QD PRN for ED 01/08/15  Yes Historical Provider, MD   Family History  Problem Relation Age of Onset  . Heart attack Mother   . Brain cancer Father   . Heart attack Sister   . Congenital heart disease Sister   . Leukemia Paternal Grandmother   . COPD Brother   . Heart disease Brother    Past Surgical History  Procedure Laterality Date  .  Tonsillectomy and adenoidectomy  1960  . Laparoscopic gastric sleeve resection  08/21/2013  . Coronary stent placement  02/2012    Patient Active Problem List   Diagnosis Date Noted  . Chronic respiratory failure with hypoxia ([place]) 01/06/2015  . Chronic bronchitis ([place]) 11/30/2014  . COPD, moderate ([place]) 11/30/2014  . H/O: depression 11/30/2014  . Old myocardial infarction 11/30/2014  . HLD (hyperlipidemia) 11/30/2014  . Low back pain 11/30/2014  . Extreme obesity ([place]) 11/30/2014  . Arthritis of knee, degenerative 11/30/2014  . Back pain, thoracic 11/30/2014  . Dermatophytosis of groin 11/30/2014  . Adiposity 07/08/2014  . Bariatric surgery status 07/08/2014  . Morbid obesity ([place]) 08/21/2013  . Glaucoma 03/14/2012  . Arthritis, degenerative 03/14/2012  . Ventricular fibrillation ([place]) 03/14/2012  . Arteriosclerosis of coronary artery 02/26/2012  . CAFL (chronic airflow limitation) ([place]) 02/26/2012  . BP (high blood pressure) 02/26/2012  . Chronic obstructive pulmonary disease ([place]) 02/26/2012  . Essential (primary) hypertension 07/09/2006  . Open-angle glaucoma 07/09/2006  . Hypoxemia 07/09/2006  . Obstructive apnea 07/09/2006    History reviewed. No pertinent past medical history.  Social History   Social History  . Marital Status: Married    Spouse Name: N/A  . Number of Children: N/A  . Years of Education: N/A   Occupational History  . Not on file.   Social History Main Topics  .  Smoking status: Never Smoker   . Smokeless tobacco: Not on file  . Alcohol Use: 0.0 oz/week    0 Standard drinks or equivalent per week     Comment: OCCASIONALLY  . Drug Use: No  . Sexual Activity: Not on file   Other Topics Concern  . Not on file   Social History Narrative    Allergies  Allergen Reactions  . Atorvastatin Other (See Comments)  . Pregabalin Other (See Comments)    Review of Systems  Constitutional: Negative.   HENT: Negative.   Eyes: Negative.     Respiratory: Negative.   Cardiovascular: Negative.   Gastrointestinal: Negative.   Genitourinary: Negative.   Musculoskeletal: Negative.   Skin: Negative.   Neurological: Negative.   Endo/Heme/Allergies: Negative.   Psychiatric/Behavioral: Negative.     Immunization History  Administered Date(s) Administered  . Influenza, High Dose Seasonal PF 03/08/2015   Objective:  BP 158/76 mmHg  Pulse 60  Temp(Src) 98 F (36.7 C) (Oral)  Resp 14  Wt 320 lb 6.4 oz (145.332 kg)  SpO2 96% Wt Readings from Last 3 Encounters:  05/10/15 320 lb 6.4 oz (145.332 kg)  12/24/14 305 lb (138.347 kg)  12/01/14 310 lb (140.615 kg)    Physical Exam  Constitutional: He is oriented to person, place, and time and well-developed, well-nourished, and in no distress.  HENT:  Head: Normocephalic.  Eyes: Conjunctivae and EOM are normal.  Neck: Neck supple.  Cardiovascular: Normal rate, regular rhythm and normal heart sounds.   Pulmonary/Chest: Effort normal and breath sounds normal.  Abdominal: Bowel sounds are normal.  Musculoskeletal:  Pain and crepitus with popping in the right knee to try to walk.   Neurological: He is alert and oriented to person, place, and time.    Lab Results  Component Value Date   WBC 9.7 01/21/2013   HGB 14.1 01/21/2013   HCT 41.3 01/21/2013   PLT 254 01/21/2013   GLUCOSE 98 01/21/2013   INR 1.0 01/21/2013   HGBA1C 5.7 01/21/2013    CMP     Component Value Date/Time   NA 135* 01/21/2013 0925   K 3.9 01/21/2013 0925   CL 101 01/21/2013 0925   CO2 33* 01/21/2013 0925   GLUCOSE 98 01/21/2013 0925   BUN 9 01/21/2013 0925   CREATININE 0.49* 01/21/2013 0925   CALCIUM 9.1 01/21/2013 0925   PROT 7.6 01/21/2013 0925   ALBUMIN 3.5 01/21/2013 0925   AST 17 01/21/2013 0925   ALT 20 01/21/2013 0925   ALKPHOS 75 01/21/2013 0925   BILITOT 0.4 01/21/2013 0925   GFRNONAA >60 01/21/2013 0925   GFRAA >60 01/21/2013 0925    Assessment and Plan :  1. COPD, moderate  ([place]) Much improved without use of oxygen now. Pulse oximetry 96% today. Can't walk much due to severe osteoarthritis in the right knee. Still working on further weight loss before considering joint replacement. Has lost over 150 lbs and this has helped his breathing immensely. Recheck prn.  2. Obstructive apnea Sleeping better since losing weight. Still using CPAP with oxygen bleed-in from concentrator at night. Recheck prn.   [facility] 05/10/2015 10:40 AM

## 2015-05-17 ENCOUNTER — Other Ambulatory Visit: Payer: Self-pay | Admitting: Specialist

## 2015-05-17 DIAGNOSIS — E7800 Pure hypercholesterolemia, unspecified: Secondary | ICD-10-CM

## 2015-05-18 ENCOUNTER — Ambulatory Visit
Admission: RE | Admit: 2015-05-18 | Discharge: 2015-05-18 | Disposition: A | Discharge status: Home / Self Care | Payer: Medicare Other | Source: Ambulatory Visit | Attending: Specialist | Admitting: Specialist

## 2015-05-18 DIAGNOSIS — E7800 Pure hypercholesterolemia, unspecified: Secondary | ICD-10-CM | POA: Diagnosis not present

## 2015-05-18 DIAGNOSIS — K7689 Other specified diseases of liver: Secondary | ICD-10-CM | POA: Diagnosis not present

## 2015-06-22 ENCOUNTER — Other Ambulatory Visit: Payer: Self-pay | Admitting: Specialist

## 2015-06-22 ENCOUNTER — Ambulatory Visit
Admission: RE | Admit: 2015-06-22 | Discharge: 2015-06-22 | Disposition: A | Discharge status: Home / Self Care | Payer: Medicare Other | Source: Ambulatory Visit | Attending: Specialist | Admitting: Specialist

## 2015-06-22 DIAGNOSIS — Z9884 Bariatric surgery status: Secondary | ICD-10-CM | POA: Insufficient documentation

## 2015-06-22 DIAGNOSIS — R918 Other nonspecific abnormal finding of lung field: Secondary | ICD-10-CM | POA: Insufficient documentation

## 2015-06-23 ENCOUNTER — Encounter: Payer: Self-pay | Admitting: Family Medicine

## 2015-06-24 ENCOUNTER — Other Ambulatory Visit: Payer: Self-pay | Admitting: Specialist

## 2015-06-24 DIAGNOSIS — R918 Other nonspecific abnormal finding of lung field: Secondary | ICD-10-CM

## 2015-07-01 ENCOUNTER — Ambulatory Visit
Admission: RE | Admit: 2015-07-01 | Discharge: 2015-07-01 | Disposition: A | Discharge status: Home / Self Care | Payer: Medicare Other | Source: Ambulatory Visit | Attending: Specialist | Admitting: Specialist

## 2015-07-01 DIAGNOSIS — K449 Diaphragmatic hernia without obstruction or gangrene: Secondary | ICD-10-CM | POA: Insufficient documentation

## 2015-07-01 DIAGNOSIS — R918 Other nonspecific abnormal finding of lung field: Secondary | ICD-10-CM

## 2015-07-01 DIAGNOSIS — I2510 Atherosclerotic heart disease of native coronary artery without angina pectoris: Secondary | ICD-10-CM | POA: Diagnosis not present

## 2015-07-01 DIAGNOSIS — R938 Abnormal findings on diagnostic imaging of other specified body structures: Secondary | ICD-10-CM | POA: Insufficient documentation

## 2015-07-01 DIAGNOSIS — J986 Disorders of diaphragm: Secondary | ICD-10-CM | POA: Diagnosis not present

## 2015-07-01 HISTORY — DX: Essential (primary) hypertension: I10

## 2015-07-01 HISTORY — DX: Heart failure, unspecified: I50.9

## 2015-07-01 LAB — POCT I-STAT CREATININE: CREATININE: 0.5 mg/dL — AB (ref 0.61–1.24)

## 2015-07-01 MED ORDER — IOHEXOL 350 MG/ML SOLN
75.0000 mL | Freq: Once | INTRAVENOUS | Status: AC | PRN
  Administered 2015-07-01: 75 mL via INTRAVENOUS

## 2015-07-28 ENCOUNTER — Telehealth: Payer: Self-pay

## 2015-07-28 NOTE — Telephone Encounter (Signed)
Refill request received from [facility] requesting Sertraline 100 mg.  

## 2015-07-29 MED ORDER — SERTRALINE HCL 100 MG PO TABS: 100.0000 mg | ORAL_TABLET | Freq: Every day | ORAL | Status: DC

## 2015-10-12 ENCOUNTER — Telehealth: Payer: Self-pay | Admitting: Family Medicine

## 2015-10-12 NOTE — Telephone Encounter (Signed)
Please review

## 2015-10-12 NOTE — Telephone Encounter (Signed)
Pt's wife called saying her husband is having surgery in the next two weeks.  A surgery regarding his gastric bypass.  He is taking Asprin prescribed by you.  They need a letter from you saying when he needs to discontinue and start back the Asprin.  Pt's contact number is [phone].     Thanks, [employer]

## 2015-10-12 NOTE — Telephone Encounter (Signed)
Has been taking ASA since he had coronary stent surgery Sept. 2013. Wife will contact surgeon (Dr. Gaynelle Arabian) to get them to send [place] information about what the need in a letter and a fax number to send it.

## 2016-01-25 ENCOUNTER — Other Ambulatory Visit: Payer: Self-pay

## 2016-01-25 MED ORDER — SERTRALINE HCL 100 MG PO TABS: 100.0000 mg | ORAL_TABLET | Freq: Every day | ORAL | 0 refills | Status: DC

## 2016-01-25 NOTE — Telephone Encounter (Signed)
Refill request received from [facility] requesting Sertraline 100 mg.

## 2016-03-23 ENCOUNTER — Other Ambulatory Visit: Payer: Self-pay

## 2016-03-23 MED ORDER — SERTRALINE HCL 100 MG PO TABS: 100.0000 mg | ORAL_TABLET | Freq: Every day | ORAL | 3 refills | Status: DC

## 2016-03-23 NOTE — Telephone Encounter (Signed)
Refill request received from [facility] requesting Sertraline 100 mg.

## 2016-08-21 ENCOUNTER — Other Ambulatory Visit: Payer: Self-pay | Admitting: Family Medicine

## 2016-08-21 ENCOUNTER — Telehealth: Payer: Self-pay | Admitting: Family Medicine

## 2016-08-21 DIAGNOSIS — Z8659 Personal history of other mental and behavioral disorders: Secondary | ICD-10-CM

## 2016-08-21 MED ORDER — SERTRALINE HCL 100 MG PO TABS: 100.0000 mg | ORAL_TABLET | Freq: Every day | ORAL | 3 refills | Status: DC

## 2016-08-21 NOTE — Telephone Encounter (Signed)
See note from 10-12-15.

## 2016-08-21 NOTE — Telephone Encounter (Signed)
Refill of Sertraline sent to his pharmacy. Due for for follow up appointment and medicare annual wellness screening with health nurse advisor in the office.

## 2016-08-21 NOTE — Telephone Encounter (Signed)
Estill Bamberg with [facility] is requesting a refill on pt's sertraline (ZOLOFT) 100 MG tablet Last Rx: 03/23/16 with 3 refills Last OV: 05/10/15 Please advise. Thanks TNP

## 2016-08-21 NOTE — Telephone Encounter (Signed)
Spoke with pt and he stated he has his CPE while he was out of town. Pt is scheduled for AWV with [facility] on 09/05/16 @ 1 pm and F/U with Simona Huh @ 130 pm. Thanks TNP

## 2016-09-04 ENCOUNTER — Ambulatory Visit (INDEPENDENT_AMBULATORY_CARE_PROVIDER_SITE_OTHER): Payer: Medicare Other | Admitting: Family Medicine

## 2016-09-04 ENCOUNTER — Other Ambulatory Visit: Payer: Self-pay | Admitting: Family Medicine

## 2016-09-04 ENCOUNTER — Ambulatory Visit
Admission: RE | Admit: 2016-09-04 | Discharge: 2016-09-04 | Disposition: A | Discharge status: Home / Self Care | Payer: Medicare Other | Source: Ambulatory Visit | Attending: Bariatrics | Admitting: Bariatrics

## 2016-09-04 ENCOUNTER — Encounter: Payer: Self-pay | Admitting: Family Medicine

## 2016-09-04 ENCOUNTER — Telehealth: Payer: Self-pay | Admitting: Family Medicine

## 2016-09-04 ENCOUNTER — Other Ambulatory Visit: Payer: Self-pay | Admitting: Bariatrics

## 2016-09-04 VITALS — BP 152/70 | HR 68 | Temp 97.7°F | Resp 16 | Wt 257.0 lb

## 2016-09-04 DIAGNOSIS — N281 Cyst of kidney, acquired: Secondary | ICD-10-CM | POA: Insufficient documentation

## 2016-09-04 DIAGNOSIS — K5730 Diverticulosis of large intestine without perforation or abscess without bleeding: Secondary | ICD-10-CM | POA: Diagnosis not present

## 2016-09-04 DIAGNOSIS — I708 Atherosclerosis of other arteries: Secondary | ICD-10-CM | POA: Insufficient documentation

## 2016-09-04 DIAGNOSIS — R300 Dysuria: Secondary | ICD-10-CM | POA: Diagnosis not present

## 2016-09-04 DIAGNOSIS — T8189XA Other complications of procedures, not elsewhere classified, initial encounter: Secondary | ICD-10-CM | POA: Insufficient documentation

## 2016-09-04 DIAGNOSIS — L7634 Postprocedural seroma of skin and subcutaneous tissue following other procedure: Secondary | ICD-10-CM

## 2016-09-04 DIAGNOSIS — Z87442 Personal history of urinary calculi: Secondary | ICD-10-CM | POA: Diagnosis not present

## 2016-09-04 DIAGNOSIS — Z9889 Other specified postprocedural states: Secondary | ICD-10-CM | POA: Insufficient documentation

## 2016-09-04 DIAGNOSIS — N200 Calculus of kidney: Secondary | ICD-10-CM | POA: Diagnosis present

## 2016-09-04 DIAGNOSIS — I700 Atherosclerosis of aorta: Secondary | ICD-10-CM | POA: Diagnosis not present

## 2016-09-04 DIAGNOSIS — R109 Unspecified abdominal pain: Secondary | ICD-10-CM

## 2016-09-04 DIAGNOSIS — Z9884 Bariatric surgery status: Secondary | ICD-10-CM | POA: Diagnosis not present

## 2016-09-04 DIAGNOSIS — N132 Hydronephrosis with renal and ureteral calculous obstruction: Secondary | ICD-10-CM | POA: Diagnosis not present

## 2016-09-04 LAB — POCT URINALYSIS DIPSTICK
BILIRUBIN UA: NEGATIVE
Glucose, UA: NEGATIVE
Ketones, UA: NEGATIVE
Leukocytes, UA: NEGATIVE
NITRITE UA: NEGATIVE
PH UA: 6 (ref 5.0–8.0)
Spec Grav, UA: 1.025 (ref 1.030–1.035)
Urobilinogen, UA: 0.2 (ref ?–2.0)

## 2016-09-04 MED ORDER — PHENAZOPYRIDINE HCL 200 MG PO TABS: 200.0000 mg | ORAL_TABLET | Freq: Three times a day (TID) | ORAL | 0 refills | Status: DC | PRN

## 2016-09-04 MED ORDER — CIPROFLOXACIN HCL 500 MG PO TABS: 500.0000 mg | ORAL_TABLET | Freq: Two times a day (BID) | ORAL | 0 refills | Status: DC

## 2016-09-04 NOTE — Telephone Encounter (Signed)
fyi and there is another message about this-aa

## 2016-09-04 NOTE — Telephone Encounter (Signed)
Crystal with [facility] called stating that Dr. Duke Salvia had already ordered CT and results showed a kidney stone on pt. Crystal can be reached at [phone] which is a call center (Crystal leaves at 4:00 pm today) when they answer at the call back center just ask them to send a message back asking Crystal to call you. Thanks CC

## 2016-09-04 NOTE — Telephone Encounter (Signed)
Fyi and there is another message about this-aa

## 2016-09-04 NOTE — Telephone Encounter (Signed)
Pt's wife Lorre Nick called requesting appointment for ultrasound be cancelled.She states that CT was ordered by surgeon and a kidney stone was found.I will cancel ultrasound.Just let me know if you want this rescheduled.Call back # is [phone]

## 2016-09-04 NOTE — Telephone Encounter (Signed)
Cancel abdominal ultrasound.

## 2016-09-04 NOTE — Telephone Encounter (Signed)
Reviewed CT scan report.With 5 mm stone in the UVJ on the right causing hydroureteronephrosis, he needs urology referral. Stones in the right kidney may be too large to pass without assistance. (Tried to contact patient, but no answer).

## 2016-09-04 NOTE — Progress Notes (Signed)
Patient: William Armstrong Male    DOB: Mar 07, 1951   66 y.o.   MRN: [id number] Visit Date: 09/04/2016  Today's Provider: Vernie Murders, PA   Chief Complaint  Patient presents with  . Urinary Tract Infection   Subjective:    Urinary Tract Infection   This is a new problem. The current episode started yesterday. The problem occurs every urination. The problem has been unchanged. The quality of the pain is described as burning. The pain is at a severity of 7/10. Maximum temperature: temperature was 99.5 the last 2 days. There is no history of pyelonephritis. Associated symptoms include flank pain, frequency, hesitancy and urgency. Pertinent negatives include no chills, discharge, hematuria, nausea, sweats or vomiting. Associated symptoms comments: Abdominal pain, brown urine 5 days ago. He has tried nothing for the symptoms. His past medical history is significant for catheterization (on 08/10/2016 for surgery) and kidney stones (35 years ago).   Patient Active Problem List   Diagnosis Date Noted  . Chronic respiratory failure with hypoxia ([place]) 01/06/2015  . Chronic bronchitis ([place]) 11/30/2014  . COPD, moderate ([place]) 11/30/2014  . H/O: depression 11/30/2014  . Old myocardial infarction 11/30/2014  . HLD (hyperlipidemia) 11/30/2014  . Low back pain 11/30/2014  . Extreme obesity ([place]) 11/30/2014  . Arthritis of knee, degenerative 11/30/2014  . Back pain, thoracic 11/30/2014  . Dermatophytosis of groin 11/30/2014  . Adiposity 07/08/2014  . Bariatric surgery status 07/08/2014  . Morbid obesity ([place]) 08/21/2013  . Glaucoma 03/14/2012  . Arthritis, degenerative 03/14/2012  . Ventricular fibrillation ([place]) 03/14/2012  . Arteriosclerosis of coronary artery 02/26/2012  . CAFL (chronic airflow limitation) ([place]) 02/26/2012  . BP (high blood pressure) 02/26/2012  . Chronic obstructive pulmonary disease ([place]) 02/26/2012  . Essential (primary) hypertension 07/09/2006  . Open-angle  glaucoma 07/09/2006  . Hypoxemia 07/09/2006  . Obstructive apnea 07/09/2006   Past Surgical History:  Procedure Laterality Date  . CORONARY STENT PLACEMENT  02/2012  . LAPAROSCOPIC GASTRIC SLEEVE RESECTION  08/21/2013  . TONSILLECTOMY AND ADENOIDECTOMY  1960   Family History  Problem Relation Age of Onset  . Heart attack Mother   . Brain cancer Father   . Heart attack Sister   . Congenital heart disease Sister   . Leukemia Paternal Grandmother   . COPD Brother   . Heart disease Brother     Allergies  Allergen Reactions  . Atorvastatin Other (See Comments)  . Pregabalin Other (See Comments)    Current Outpatient Prescriptions:  .  Aspirin 81 MG EC tablet, 1 tablet daily., Disp: , Rfl:  .  Cholecalciferol (VITAMIN D) 2000 UNITS tablet, Take 1 tablet by mouth daily., Disp: , Rfl:  .  dorzolamide (TRUSOPT) 2 % ophthalmic solution, Apply 1 drop to eye 3 (three) times daily., Disp: , Rfl:  .  econazole nitrate 1 % cream, Apply 1 application topically daily as needed., Disp: , Rfl:  .  latanoprost (XALATAN) 0.005 % ophthalmic solution, Place 1 drop into both eyes at bedtime., Disp: , Rfl:  .  lisinopril (PRINIVIL,ZESTRIL) 40 MG tablet, Take 1 tablet by mouth daily., Disp: , Rfl:  .  nystatin cream (MYCOSTATIN), Apply 1 application topically 2 (two) times daily., Disp: , Rfl:  .  rosuvastatin (CRESTOR) 10 MG tablet, Take 1 tablet by mouth daily., Disp: , Rfl:  .  sertraline (ZOLOFT) 100 MG tablet, Take 1 tablet (100 mg total) by mouth daily., Disp: 90 tablet, Rfl: 3 .  sildenafil (REVATIO)  20 MG tablet, 5 tabs PO QD PRN for ED, Disp: , Rfl:   Review of Systems  Constitutional: Negative for chills.  Gastrointestinal: Positive for abdominal pain. Negative for nausea and vomiting.  Genitourinary: Positive for flank pain, frequency, hesitancy and urgency. Negative for hematuria.    Social History  Substance Use Topics  . Smoking status: Never Smoker  . Smokeless tobacco: Never Used    . Alcohol use No   Objective:   BP (!) 152/70 (BP Location: Right Arm, Patient Position: Sitting, Cuff Size: Large)   Pulse 68   Temp 97.7 F (36.5 C) (Oral)   Resp 16   Wt 257 lb (116.6 kg)   BMI 39.66 kg/m  Vitals:   09/04/16 0839  BP: (!) 152/70  Pulse: 68  Resp: 16  Temp: 97.7 F (36.5 C)  TempSrc: Oral  Weight: 257 lb (116.6 kg)   Physical Exam  Constitutional: He is oriented to person, place, and time. He appears well-developed and well-nourished. No distress.  HENT:  Head: Normocephalic and atraumatic.  Right Ear: Hearing normal.  Left Ear: Hearing normal.  Nose: Nose normal.  Eyes: Conjunctivae and lids are normal. Right eye exhibits no discharge. Left eye exhibits no discharge. No scleral icterus.  Cardiovascular: Normal rate and regular rhythm.   Pulmonary/Chest: Effort normal and breath sounds normal. No respiratory distress.  Abdominal: Soft. Bowel sounds are normal.  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 Slight soreness and bladder pressure to palpation. Minimal soreness to posterior CVA percussion.  Musculoskeletal: Normal range of motion.  Neurological: He is alert and oriented to person, place, and time.  Skin: Skin is intact. No lesion and no rash noted.  Psychiatric: He has a normal mood and affect. His speech is normal and behavior is normal. Thought content normal.      Assessment & Plan:     1. Dysuria Onset the past 2-3 days worse this morning with some frequency increasing. No gross hematuria but large amount of calcium oxalate crystals and large amount of microscopic hematuria. Will start Cipro and Pyridium and get a abdominal ultrasound with urine C&S. Increase fluid intake and decrease calcium supplement. Recheck pending reports. - POCT urinalysis dipstick - phenazopyridine (PYRIDIUM) 200 MG tablet; Take 1 tablet (200 mg total) by  mouth 3 (three) times daily as needed for pain.  Dispense: 10 tablet; Refill: 0 - ciprofloxacin (CIPRO) 500 MG tablet; Take 1 tablet (500 mg total) by mouth 2 (two) times daily.  Dispense: 20 tablet; Refill: 0 - Urine culture  2. History of kidney stones Last stone was 35 years ago. Pain this time is not as severe.  3. Right flank discomfort Onset over the past 2-3 days. Had a urinary catheter during surgery to remove redundant abdominal skin on 08-10-16. No gross hematuria. Will get abdominal ultrasound to rule out a stone. - US Abdomen Complete     Patient seen and examined by Vernie Murders, PA, and note scribed by Renaldo Fiddler, CMA.  Vernie Murders, PA  [facility]

## 2016-09-05 ENCOUNTER — Ambulatory Visit: Payer: Medicare Other

## 2016-09-05 ENCOUNTER — Ambulatory Visit: Payer: Medicare Other | Admitting: Family Medicine

## 2016-09-05 NOTE — Telephone Encounter (Signed)
Please call patient. I was on the phone yesterday. Thank you-aa

## 2016-09-05 NOTE — Telephone Encounter (Signed)
Patient advised as below. Patient wants to hold off on the Urology referral for now.  Patient states he has an appointment with his surgeon Dr. Duke Salvia tomorrow morning, and wants to see what he says and recommends. Patient will call back if Dr. Duke Salvia recommends a urology referral.

## 2016-09-05 NOTE — Telephone Encounter (Signed)
Agree with this plan.

## 2016-09-06 ENCOUNTER — Ambulatory Visit: Payer: Medicare Other

## 2016-09-06 ENCOUNTER — Telehealth: Payer: Self-pay | Admitting: Family Medicine

## 2016-09-06 DIAGNOSIS — N200 Calculus of kidney: Secondary | ICD-10-CM

## 2016-09-06 LAB — URINE CULTURE: ORGANISM ID, BACTERIA: NO GROWTH

## 2016-09-06 NOTE — Telephone Encounter (Signed)
Order placed. Please schedule. Thanks!

## 2016-09-06 NOTE — Telephone Encounter (Signed)
Pt's wife Lorre Nick stopped by office requesting referral to urologist for kidney stones shown on CT ordered by Dr Duke Salvia.Lorre Nick made aware that you are not in office today

## 2016-09-07 ENCOUNTER — Other Ambulatory Visit: Payer: Self-pay | Admitting: Family Medicine

## 2016-09-07 DIAGNOSIS — R300 Dysuria: Secondary | ICD-10-CM

## 2016-09-07 MED ORDER — PHENAZOPYRIDINE HCL 200 MG PO TABS: 200.0000 mg | ORAL_TABLET | Freq: Three times a day (TID) | ORAL | 0 refills | Status: DC | PRN

## 2016-09-07 NOTE — Telephone Encounter (Signed)
[facility] faxed refill request for the following medications: phenazopyridine (PYRIDIUM) 200 MG tablet Last Rx: 09/04/16 for 10 tablets Last OV: 09/04/16 Please advise. Thanks TNP

## 2016-09-07 NOTE — Telephone Encounter (Signed)
Pt also sent refill request via the [facility] Website  for the same medication phenazopyridine (PYRIDIUM) 200 MG tablet to be sent to [facility]. Please advise. Thanks TNP

## 2016-09-07 NOTE — Telephone Encounter (Signed)
Patient has one pill left.

## 2016-09-11 NOTE — Progress Notes (Signed)
09/12/2016 2:21 PM   [name] 02/19/1951 [id number]  Referring provider: [name], [address] [place], [place] [address]  Chief Complaint  Patient presents with  . New Patient (Initial Visit)    kidney stone referred by Dr. Jeananne Rama office    HPI: Patient is a 66 year old Caucasian male who is referred by Dr.Tyner for nephrolithiasis.  Patient states the onset of the pain was two weeks ago.   He described the pain as a stinging and burning in the penis.  It lasted for several hours.  The pain was located in the penis and did not radiate.    The pain was a 10/10.   Pyridium made the pain better.   Nothing made the pain worse.  He did not have gross hematuria, fevers, chills, nausea or vomiting.  He was seen by his PCP and given an antibiotic.  His urine culture came back negative.    The CT scan was then ordered.    CT Renal stone study performed on 09/04/2016 noted 5 mm right ureteral vesicle junction obstructing stone with moderate-to-marked right hydroureteronephrosis. Bilateral nonobstructing renal calculi and renal cysts are noted. I have independently reviewed the films.    Today, he states he has not had any pain for the last third days.  He has not passed a fragment.  UA today was positive for uric acid crystals.  He has passed some small specks.    He does have a prior history of stones.  He spontaneously passed a stone 40 years ago.      PMH: Past Medical History:  Diagnosis Date  . BCC (basal cell carcinoma of skin)   . CHF (congestive heart failure) ([place])   . Depression   . Glaucoma   . Heart attack   . HTN (hypertension)   . Hypertension   . Kidney stone   . Sleep apnea     Surgical History: Past Surgical History:  Procedure Laterality Date  . CORONARY STENT PLACEMENT  02/2012  . LAPAROSCOPIC GASTRIC RESTRICTIVE DUODENAL PROCEDURE (DUODENAL SWITCH)    . LAPAROSCOPIC GASTRIC SLEEVE RESECTION  08/21/2013  . skin removal surgery     removal of extra skin approx 20 lbs  . TONSILLECTOMY AND ADENOIDECTOMY  1960    Home Medications:  Allergies as of 09/12/2016      Reactions   Atorvastatin Other (See Comments)   Pregabalin Other (See Comments)      Medication List       Accurate as of 09/12/16  2:21 PM. Always use your most recent med list.          Aspirin 81 MG EC tablet 1 tablet daily.   ciprofloxacin 500 MG tablet Commonly known as:  CIPRO Take 1 tablet (500 mg total) by mouth 2 (two) times daily.   CRESTOR 10 MG tablet Generic drug:  rosuvastatin Take 1 tablet by mouth daily.   dorzolamide 2 % ophthalmic solution Commonly known as:  TRUSOPT Apply 1 drop to eye 3 (three) times daily.   econazole nitrate 1 % cream Apply 1 application topically daily as needed.   latanoprost 0.005 % ophthalmic solution Commonly known as:  XALATAN Place 1 drop into both eyes at bedtime.   lisinopril 40 MG tablet Commonly known as:  PRINIVIL,ZESTRIL Take 1 tablet by mouth daily.   nystatin cream Commonly known as:  MYCOSTATIN Apply 1 application topically 2 (two) times daily.   phenazopyridine 200 MG tablet Commonly known  as:  PYRIDIUM Take 1 tablet (200 mg total) by mouth 3 (three) times daily as needed for pain.   sertraline 100 MG tablet Commonly known as:  ZOLOFT Take 1 tablet (100 mg total) by mouth daily.   sildenafil 20 MG tablet Commonly known as:  REVATIO 5 tabs PO QD PRN for ED   Vitamin D 2000 units tablet Take 1 tablet by mouth daily.       Allergies:  Allergies  Allergen Reactions  . Atorvastatin Other (See Comments)  . Pregabalin Other (See Comments)    Family History: Family History  Problem Relation Age of Onset  . Heart attack Mother   . Brain cancer Father   . Heart attack Sister   . Congenital heart disease Sister   . Leukemia Paternal Grandmother   . COPD Brother   . Heart disease Brother   . Prostate cancer Neg Hx   . Kidney cancer Neg Hx   . Bladder Cancer Neg  Hx     Social History:  reports that he has never smoked. He has never used smokeless tobacco. He reports that he does not drink alcohol or use drugs.  ROS: UROLOGY Frequent Urination?: No Hard to postpone urination?: No Burning/pain with urination?: No Get up at night to urinate?: No Leakage of urine?: No Urine stream starts and stops?: No Trouble starting stream?: No Do you have to strain to urinate?: No Blood in urine?: No Urinary tract infection?: No Sexually transmitted disease?: No Injury to kidneys or bladder?: No Painful intercourse?: No Weak stream?: No Erection problems?: Yes Penile pain?: No  Gastrointestinal Nausea?: No Vomiting?: No Indigestion/heartburn?: No Diarrhea?: No Constipation?: No  Constitutional Fever: No Night sweats?: No Weight loss?: No Fatigue?: No  Skin Skin rash/lesions?: No Itching?: No  Eyes Blurred vision?: No Double vision?: No  Ears/Nose/Throat Sore throat?: No Sinus problems?: No  Hematologic/Lymphatic Swollen glands?: No Easy bruising?: No  Cardiovascular Leg swelling?: No Chest pain?: No  Respiratory Cough?: No Shortness of breath?: No  Endocrine Excessive thirst?: No  Musculoskeletal Back pain?: No Joint pain?: Yes  Neurological Headaches?: No Dizziness?: No  Psychologic Depression?: Yes Anxiety?: No  Physical Exam: BP (!) 148/75   Pulse 71   Ht 5' 7" (1.702 m)   Wt 250 lb 8 oz (113.6 kg)   BMI 39.23 kg/m   Constitutional: Well nourished. Alert and oriented, No acute distress. HEENT: [place] AT, moist mucus membranes. Trachea midline, no masses. Cardiovascular: No clubbing, cyanosis, or edema. Respiratory: Normal respiratory effort, no increased work of breathing. GI: Abdomen is soft, non tender, non distended, no abdominal masses. Liver and spleen not palpable.  No hernias appreciated.  Stool sample for occult testing is not indicated.   GU: No CVA tenderness.  No bladder fullness or masses.     Skin: No rashes, bruises or suspicious lesions. Lymph: No cervical or inguinal adenopathy. Neurologic: Grossly intact, no focal deficits, moving all 4 extremities. Psychiatric: Normal mood and affect.  Laboratory Data: Lab Results  Component Value Date   WBC 9.7 01/21/2013   HGB 14.1 01/21/2013   HCT 41.3 01/21/2013   MCV 85 01/21/2013   PLT 254 01/21/2013    Lab Results  Component Value Date   CREATININE 0.50 (L) 07/01/2015    Lab Results  Component Value Date   HGBA1C 5.7 01/21/2013    Lab Results  Component Value Date   TSH 2.16 01/21/2013    Lab Results  Component Value Date   AST 17  01/21/2013   Lab Results  Component Value Date   ALT 20 01/21/2013     Urinalysis 0-5 WBC's.  Uric acid crystals were present.  See EPIC.    Pertinent Imaging: CLINICAL DATA:  67 year old male with right flank pain and 1 day history of hematuria. Subcutaneous skin removal 08/10/2016 secondary to weight loss. Pain over incision site. Seroma. Initial encounter.  EXAM: CT ABDOMEN AND PELVIS WITHOUT CONTRAST  TECHNIQUE: Multidetector CT imaging of the abdomen and pelvis was performed following the standard protocol without IV contrast.  COMPARISON:  None.  FINDINGS: Lower chest: Basilar subsegmental atelectasis/ scarring. Coronary artery calcifications. Heart size top-normal.  Hepatobiliary: Left lobe liver 1.8 cm cyst. Taking into account limitation by non contrast imaging, no worrisome hepatic lesion. No calcified gallstone.  Pancreas: Taking into account limitation by non contrast imaging, no pancreatic mass. Slight haziness pancreatic head region may be related to third spacing of fluid.  Spleen: Taking into account limitation by non contrast imaging, no mass or enlargement.  Adrenals/Urinary Tract: 5 mm right ureteral vesicle junction obstructing stone with moderate-to-marked right hydroureteronephrosis. Four nonobstructing right renal  calculi measuring up to 6.5 mm. Posterior right upper pole 2 cm cyst. Right lateral inferior 1.8 cm cyst.  Small nonobstructing left renal calculi. No left hydronephrosis. Left upper pole 1 cm and 6 mm cyst.  No worrisome adrenal mass.  Decompressed urinary bladder.  Stomach/Bowel: Post gastric bypass surgery. Colonic diverticula. Evaluation of bowel limited by lack of distention, third spacing of fluid and lack of contrast. No obvious primary bowel abnormality.  Vascular/Lymphatic: Atherosclerotic changes aorta are without aneurysm. Atherosclerotic changes iliac arteries with dilated distal common iliac arteries measuring 2.1 cm on the left and 1.9 cm on the right.  Scattered normal size lymph nodes.  Reproductive: Dilated right seminal vesicle with peripheral calcification possibly related to prior inflammation.  Other: Post surgery of the subcutaneous region for removal the excess skin secondary to weight loss with prominent fluid collections bilaterally greater on the right which may represent seromas although infection not excluded in the proper clinical setting.  Third spacing of fluid.  Musculoskeletal: Degenerative changes lower thoracic and lumbar spine.  Marked right hip degenerative changes with subchondral cystic changes, sclerosis and complete loss of joint space.  IMPRESSION: 5 mm right ureteral vesicle junction obstructing stone with moderate-to-marked right hydroureteronephrosis. Bilateral nonobstructing renal calculi and renal cysts are noted.  Post surgery of the subcutaneous region for removal the excess skin secondary to weight loss with prominent fluid collections bilaterally greater on the right which may represent seromas although infection not excluded in the proper clinical setting.  Third spacing of fluid.  Post gastric bypass surgery. Colonic diverticula. Evaluation of bowel limited by lack of distention, third spacing of  fluid and lack of contrast. No obvious primary bowel abnormality.  Aortic atherosclerosis.  Atherosclerotic changes iliac arteries with dilated distal common iliac arteries measuring 2.1 cm on the left and 1.9 cm on the right.  Dilated right seminal vesicle with peripheral calcification possibly related to prior inflammation.  Marked right hip degenerative changes with subchondral cystic changes, sclerosis and complete loss of joint space.   Electronically Signed   By: Genia Del M.D.   On: 09/04/2016 15:23   Assessment & Plan:    Patient will undergo right ureteroscopy the with laser lithotripsy with ureteral stent placement for a 5 mm obstructing right UVJ stone and right renal stones, largest measuring 10 mm in the upper pole.    1.  Right ureteral stone  -  explained to the patient that AUA Guidelines for patients with uncomplicated ureteral stones ?10 mm should be offered observation, and those with distal stones of similar size should be offered MET with ?-blockers  - if after 4 to 6 weeks observation with or without MET is not successful we would pursue URS or ESWL, if the patient/clinician decide to intervene sooner based on a shared decision making approach, the clinicians should offer definitive stone treatment - patient would like definitive therapy with URS - tamsulosin prescription sent to pharmacy and patient given strainer  - URS would be the first lined therapy if stone(s) do not pass, but ESWL is the procedure with the least morbidity and lowest complication rate, but URS has a greater stone-free rate in a single procedure  - skin to stone distance < 15 cm and stone density < 1500 HU  - schedule right ureteroscopy with laser lithotripsy and ureteral stent placement  - explained to the patient how the procedure is performed and the risks involved  - informed patient that they will have a stent placed during the procedure and will remain in place after the  procedure for a short time.   - stent may be removed in the office with a cystoscope or patient may be instructed to remove the stent themselves by the string  - described "stent pain" as feelings of needing to urinate/overactive bladder and a warm, tingling sensation to intense pain in the affected flank  - residual stones within the kidney or ureter may be present after the procedure and may need to have these addressed at a different encounter  - injury to the ureter is the most common intra-operative risk, it may result in an open procedure to correct the defect  - infection and bleeding are also risks  - explained the risks of general anesthesia, such as: MI, CVA, paralysis, coma and/or death.  - we will obtain cardiac clearance prior to procedure  - advised to contact our office or seek treatment in the ED if becomes febrile or pain/ vomiting are difficult control in order to arrange for emergent/urgent intervention   2. Bilateral renal stones  - largest right renal stone measuring 10 mm in the upper pole  - explained to the patient that an attempt may be made to address his right renal stones, but it is possible that all the stone burden could not be removed during one procedure and he will need staged procedures in the future  - offered 24 hour urine work up once acute phase has resolved  3. Right hydronephrosis  - obtain RUS to ensure the hydronephrosis has resolved once stone has passed/removed   4. Microscopic hematuria  - UA today is negative for hematuria  - continue to monitor the patient's UA after the treatment/passage of the stone to ensure the hematuria has resolved  - if hematuria persists, we will pursue a hematuria workup with CT Urogram and cystoscopy if appropriate.  Return for right URS/LL/ureteral stent placement.  These notes generated with voice recognition software. I apologize for typographical errors.  Zara Council, [facility] [address], [place], [place] [address] [phone]

## 2016-09-12 ENCOUNTER — Other Ambulatory Visit: Payer: Self-pay | Admitting: Radiology

## 2016-09-12 ENCOUNTER — Encounter: Payer: Self-pay | Admitting: Urology

## 2016-09-12 ENCOUNTER — Ambulatory Visit (INDEPENDENT_AMBULATORY_CARE_PROVIDER_SITE_OTHER): Payer: Medicare Other | Admitting: Urology

## 2016-09-12 VITALS — BP 148/75 | HR 71 | Ht 67.0 in | Wt 250.5 lb

## 2016-09-12 DIAGNOSIS — N132 Hydronephrosis with renal and ureteral calculous obstruction: Secondary | ICD-10-CM

## 2016-09-12 DIAGNOSIS — N200 Calculus of kidney: Secondary | ICD-10-CM | POA: Diagnosis not present

## 2016-09-12 DIAGNOSIS — N201 Calculus of ureter: Secondary | ICD-10-CM

## 2016-09-12 DIAGNOSIS — R3129 Other microscopic hematuria: Secondary | ICD-10-CM

## 2016-09-12 DIAGNOSIS — N135 Crossing vessel and stricture of ureter without hydronephrosis: Secondary | ICD-10-CM

## 2016-09-12 LAB — URINALYSIS, COMPLETE
BILIRUBIN UA: NEGATIVE
Glucose, UA: NEGATIVE
KETONES UA: NEGATIVE
Leukocytes, UA: NEGATIVE
NITRITE UA: NEGATIVE
PH UA: 5 (ref 5.0–7.5)
Protein, UA: NEGATIVE
RBC UA: NEGATIVE
SPEC GRAV UA: 1.025 (ref 1.005–1.030)
Urobilinogen, Ur: 0.2 mg/dL (ref 0.2–1.0)

## 2016-09-12 LAB — MICROSCOPIC EXAMINATION
Bacteria, UA: NONE SEEN
RBC MICROSCOPIC, UA: NONE SEEN /HPF (ref 0–?)

## 2016-09-12 MED ORDER — TAMSULOSIN HCL 0.4 MG PO CAPS: 0.4000 mg | ORAL_CAPSULE | Freq: Every day | ORAL | 0 refills | Status: DC

## 2016-09-13 ENCOUNTER — Other Ambulatory Visit: Payer: Self-pay | Admitting: Radiology

## 2016-09-13 DIAGNOSIS — N200 Calculus of kidney: Secondary | ICD-10-CM

## 2016-09-13 DIAGNOSIS — N201 Calculus of ureter: Secondary | ICD-10-CM

## 2016-09-13 LAB — BASIC METABOLIC PANEL
BUN/Creatinine Ratio: 35 — ABNORMAL HIGH (ref 10–24)
BUN: 19 mg/dL (ref 8–27)
CO2: 27 mmol/L (ref 18–29)
CREATININE: 0.55 mg/dL — AB (ref 0.76–1.27)
Calcium: 9.2 mg/dL (ref 8.6–10.2)
Chloride: 100 mmol/L (ref 96–106)
GFR calc Af Amer: 124 mL/min/{1.73_m2} (ref >59)
GFR calc non Af Amer: 107 mL/min/{1.73_m2} (ref >59)
GLUCOSE: 97 mg/dL (ref 65–99)
Potassium: 5.2 mmol/L (ref 3.5–5.2)
SODIUM: 143 mmol/L (ref 134–144)

## 2016-09-13 LAB — CBC WITH DIFFERENTIAL/PLATELET
BASOS ABS: 0.1 10*3/uL (ref 0.0–0.2)
Basos: 1 %
EOS (ABSOLUTE): 0.3 10*3/uL (ref 0.0–0.4)
Eos: 3 %
HEMOGLOBIN: 12.2 g/dL — AB (ref 13.0–17.7)
Hematocrit: 37.5 % (ref 37.5–51.0)
IMMATURE GRANS (ABS): 0 10*3/uL (ref 0.0–0.1)
Immature Granulocytes: 0 %
LYMPHS: 23 %
Lymphocytes Absolute: 1.9 10*3/uL (ref 0.7–3.1)
MCH: 29.2 pg (ref 26.6–33.0)
MCHC: 32.5 g/dL (ref 31.5–35.7)
MCV: 90 fL (ref 79–97)
MONOCYTES: 7 %
Monocytes Absolute: 0.6 10*3/uL (ref 0.1–0.9)
NEUTROS PCT: 66 %
Neutrophils Absolute: 5.4 10*3/uL (ref 1.4–7.0)
Platelets: 312 10*3/uL (ref 150–379)
RBC: 4.18 x10E6/uL (ref 4.14–5.80)
RDW: 13.6 % (ref 12.3–15.4)
WBC: 8.2 10*3/uL (ref 3.4–10.8)

## 2016-09-14 ENCOUNTER — Ambulatory Visit
Admission: RE | Admit: 2016-09-14 | Discharge: 2016-09-14 | Disposition: A | Discharge status: Home / Self Care | Payer: Medicare Other | Source: Ambulatory Visit | Attending: Urology | Admitting: Urology

## 2016-09-14 DIAGNOSIS — N281 Cyst of kidney, acquired: Secondary | ICD-10-CM | POA: Insufficient documentation

## 2016-09-14 DIAGNOSIS — N1330 Unspecified hydronephrosis: Secondary | ICD-10-CM | POA: Diagnosis not present

## 2016-09-14 DIAGNOSIS — N200 Calculus of kidney: Secondary | ICD-10-CM | POA: Diagnosis present

## 2016-09-14 DIAGNOSIS — N201 Calculus of ureter: Secondary | ICD-10-CM

## 2016-09-14 LAB — CULTURE, URINE COMPREHENSIVE

## 2016-09-15 ENCOUNTER — Telehealth: Payer: Self-pay

## 2016-09-15 ENCOUNTER — Encounter
Admission: RE | Admit: 2016-09-15 | Discharge: 2016-09-15 | Disposition: A | Discharge status: Home / Self Care | Payer: Medicare Other | Source: Ambulatory Visit | Attending: Urology | Admitting: Urology

## 2016-09-15 DIAGNOSIS — I509 Heart failure, unspecified: Secondary | ICD-10-CM | POA: Diagnosis not present

## 2016-09-15 DIAGNOSIS — R001 Bradycardia, unspecified: Secondary | ICD-10-CM | POA: Diagnosis not present

## 2016-09-15 DIAGNOSIS — I498 Other specified cardiac arrhythmias: Secondary | ICD-10-CM | POA: Diagnosis not present

## 2016-09-15 DIAGNOSIS — J9611 Chronic respiratory failure with hypoxia: Secondary | ICD-10-CM | POA: Insufficient documentation

## 2016-09-15 DIAGNOSIS — I4901 Ventricular fibrillation: Secondary | ICD-10-CM | POA: Diagnosis not present

## 2016-09-15 DIAGNOSIS — Z01810 Encounter for preprocedural cardiovascular examination: Secondary | ICD-10-CM | POA: Insufficient documentation

## 2016-09-15 DIAGNOSIS — M1990 Unspecified osteoarthritis, unspecified site: Secondary | ICD-10-CM | POA: Insufficient documentation

## 2016-09-15 DIAGNOSIS — I110 Hypertensive heart disease with heart failure: Secondary | ICD-10-CM | POA: Insufficient documentation

## 2016-09-15 DIAGNOSIS — I4510 Unspecified right bundle-branch block: Secondary | ICD-10-CM | POA: Insufficient documentation

## 2016-09-15 DIAGNOSIS — G4733 Obstructive sleep apnea (adult) (pediatric): Secondary | ICD-10-CM | POA: Insufficient documentation

## 2016-09-15 DIAGNOSIS — H409 Unspecified glaucoma: Secondary | ICD-10-CM | POA: Insufficient documentation

## 2016-09-15 DIAGNOSIS — J449 Chronic obstructive pulmonary disease, unspecified: Secondary | ICD-10-CM | POA: Diagnosis not present

## 2016-09-15 DIAGNOSIS — Z01812 Encounter for preprocedural laboratory examination: Secondary | ICD-10-CM | POA: Diagnosis not present

## 2016-09-15 DIAGNOSIS — N200 Calculus of kidney: Secondary | ICD-10-CM

## 2016-09-15 DIAGNOSIS — I252 Old myocardial infarction: Secondary | ICD-10-CM | POA: Insufficient documentation

## 2016-09-15 DIAGNOSIS — Z9884 Bariatric surgery status: Secondary | ICD-10-CM | POA: Insufficient documentation

## 2016-09-15 HISTORY — DX: Personal history of urinary calculi: Z87.442

## 2016-09-15 NOTE — Patient Instructions (Signed)
Your procedure is scheduled on: Monday 09/25/16 Report to [facility]. 2ND FLOOR MEDICAL MALL ENTRANCE. To find out your arrival time please call [phone] between 1PM - 3PM on Friday 09/22/16.  Remember: Instructions that are not followed completely may result in serious medical risk, up to and including death, or upon the discretion of your surgeon and anesthesiologist your surgery may need to be rescheduled.    __X__ 1. Do not eat food or drink liquids after midnight. No gum chewing or hard candies.     __X__ 2. No Alcohol for 24 hours before or after surgery.   ____ 3. Bring all medications with you on the day of surgery if instructed.    __X__ 4. Notify your doctor if there is any change in your medical condition     (cold, fever, infections).             ___X__5. No smoking within 24 hours of your surgery.     Do not wear jewelry, make-up, hairpins, clips or nail polish.  Do not wear lotions, powders, or perfumes.   Do not shave 48 hours prior to surgery. Men may shave face and neck.  Do not bring valuables to the hospital.    [facility] is not responsible for any belongings or valuables.               Contacts, dentures or bridgework may not be worn into surgery.  Leave your suitcase in the car. After surgery it may be brought to your room.  For patients admitted to the hospital, discharge time is determined by your                treatment team.   Patients discharged the day of surgery will not be allowed to drive home.   Please read over the following fact sheets that you were given:   Pain Booklet and MRSA Information   __X__ Take these medicines the morning of surgery with A SIP OF WATER:    1. LISINOPRIL  2. ROSUVASTATIN  3. SERTRALINE  4. TAMSULOSIN  5.  6.  ____ Fleet Enema (as directed)   ____ Use CHG Soap as directed  ____ Use inhalers on the day of surgery  ____ Stop metformin 2 days prior to surgery    ____ Take 1/2 of usual insulin dose the night  before surgery and none on the morning of surgery.   __X__ Stop Coumadin/Plavix/aspirin on AS INSTRUCTED  __X__ Stop Anti-inflammatories such as Advil, Aleve, Ibuprofen, Motrin, Naproxen, Naprosyn, Goodies,powder, or aspirin products.  OK to take Tylenol.   __X__ Stop supplements until after surgery.  ZINC   __X__ Bring C-Pap to the hospital.

## 2016-09-15 NOTE — Telephone Encounter (Signed)
LMOM- need to repeat RUS in 21mo. Orders placed.

## 2016-09-15 NOTE — Telephone Encounter (Signed)
LMOM. Notified pt that surgery has been cancelled d/t stone has passed but that [facility] would like for him to have repeat RUS in 1 month.

## 2016-09-15 NOTE — Telephone Encounter (Signed)
-----   Message from Nori Riis, PA-C sent at 09/14/2016  9:25 PM EDT ----- Please let Mr. Freimark no that his right kidney is still a little swollen and we should repeat the renal ultrasound in 1 month to ensure complete resolution of the swelling.

## 2016-09-18 ENCOUNTER — Other Ambulatory Visit: Payer: Medicare Other

## 2016-09-18 ENCOUNTER — Other Ambulatory Visit: Payer: Self-pay | Admitting: Urology

## 2016-09-19 ENCOUNTER — Telehealth: Payer: Self-pay

## 2016-09-19 NOTE — Telephone Encounter (Signed)
Spoke with pt wife in reference to stone that was passed and prevention. Wife voiced understanding.

## 2016-09-19 NOTE — Telephone Encounter (Signed)
-----   Message from Nori Riis, PA-C sent at 09/18/2016  8:24 PM EDT ----- Please let the patient know that he passed a calcium oxalate stone. This is the most common type of stone people form.  For general prevention, he needs to drink 2.5 L of water daily, reduce animal protein intake (meatless Monday) and reduce salt in the diet.

## 2016-09-25 ENCOUNTER — Encounter: Admission: RE | Payer: Self-pay | Source: Ambulatory Visit

## 2016-09-25 ENCOUNTER — Ambulatory Visit: Admission: RE | Admit: 2016-09-25 | Payer: Medicare Other | Source: Ambulatory Visit | Admitting: Urology

## 2016-09-25 SURGERY — URETEROSCOPY, WITH LITHOTRIPSY USING HOLMIUM LASER
Anesthesia: Choice | Laterality: Right

## 2016-10-10 ENCOUNTER — Ambulatory Visit (INDEPENDENT_AMBULATORY_CARE_PROVIDER_SITE_OTHER): Payer: Medicare Other | Admitting: Family Medicine

## 2016-10-10 ENCOUNTER — Ambulatory Visit: Payer: Medicare Other

## 2016-10-10 ENCOUNTER — Encounter: Payer: Self-pay | Admitting: Family Medicine

## 2016-10-10 VITALS — BP 138/58 | HR 60 | Temp 99.1°F | Wt 252.4 lb

## 2016-10-10 VITALS — BP 138/58 | HR 60 | Temp 99.1°F | Ht 67.0 in | Wt 252.4 lb

## 2016-10-10 DIAGNOSIS — Z0000 Encounter for general adult medical examination without abnormal findings: Secondary | ICD-10-CM

## 2016-10-10 DIAGNOSIS — M1711 Unilateral primary osteoarthritis, right knee: Secondary | ICD-10-CM

## 2016-10-10 DIAGNOSIS — E785 Hyperlipidemia, unspecified: Secondary | ICD-10-CM | POA: Diagnosis not present

## 2016-10-10 DIAGNOSIS — Z9884 Bariatric surgery status: Secondary | ICD-10-CM

## 2016-10-10 DIAGNOSIS — I10 Essential (primary) hypertension: Secondary | ICD-10-CM

## 2016-10-10 DIAGNOSIS — H4010X0 Unspecified open-angle glaucoma, stage unspecified: Secondary | ICD-10-CM | POA: Diagnosis not present

## 2016-10-10 NOTE — Addendum Note (Signed)
Addended by: Vernie Murders E on: 10/10/2016 05:34 PM   Modules accepted: Level of Service

## 2016-10-10 NOTE — Progress Notes (Signed)
Patient: William Armstrong, Male    DOB: 01/08/1951, 66 y.o.   MRN: [id number] Visit Date: 10/10/2016  Today's Provider: Vernie Murders, PA   Chief Complaint  Patient presents with  . Annual Exam   Subjective:    Annual physical exam William Armstrong is a 66 y.o. male who presents today for health maintenance and complete physical. He feels well. He reports exercising light walking. He reports he is sleeping well.  -----------------------------------------------------------------   Review of Systems  Constitutional: Negative.   HENT: Negative.   Eyes: Negative.   Respiratory: Negative.   Cardiovascular: Negative.   Gastrointestinal: Negative.   Endocrine: Negative.   Genitourinary: Negative.   Musculoskeletal: Positive for arthralgias.  Skin: Negative.   Allergic/Immunologic: Negative.   Neurological: Negative.   Hematological: Negative.   Psychiatric/Behavioral: Negative.     Social History      He  reports that he has never smoked. He has never used smokeless tobacco. He reports that he does not drink alcohol or use drugs.       Social History   Social History  . Marital status: Married    Spouse name: N/A  . Number of children: N/A  . Years of education: N/A   Social History Main Topics  . Smoking status: Never Smoker  . Smokeless tobacco: Never Used  . Alcohol use No  . Drug use: No  . Sexual activity: Not Asked   Other Topics Concern  . None   Social History Narrative  . None    Past Medical History:  Diagnosis Date  . BCC (basal cell carcinoma of skin)   . CHF (congestive heart failure) ([place])   . Depression   . Glaucoma   . Heart attack ([place])   . History of kidney stones   . HTN (hypertension)   . Hypertension   . Kidney stone   . Sleep apnea      Patient Active Problem List   Diagnosis Date Noted  . Chronic respiratory failure with hypoxia ([place]) 01/06/2015  . Chronic bronchitis ([place]) 11/30/2014  . COPD, moderate ([place]) 11/30/2014    . H/O: depression 11/30/2014  . Old myocardial infarction 11/30/2014  . HLD (hyperlipidemia) 11/30/2014  . Low back pain 11/30/2014  . Extreme obesity 11/30/2014  . Arthritis of knee, degenerative 11/30/2014  . Back pain, thoracic 11/30/2014  . Dermatophytosis of groin 11/30/2014  . Adiposity 07/08/2014  . Bariatric surgery status 07/08/2014  . Morbid obesity ([place]) 08/21/2013  . Glaucoma 03/14/2012  . Arthritis, degenerative 03/14/2012  . Ventricular fibrillation ([place]) 03/14/2012  . Arteriosclerosis of coronary artery 02/26/2012  . CAFL (chronic airflow limitation) ([place]) 02/26/2012  . BP (high blood pressure) 02/26/2012  . Chronic obstructive pulmonary disease ([place]) 02/26/2012  . Essential (primary) hypertension 07/09/2006  . Open-angle glaucoma 07/09/2006  . Hypoxemia 07/09/2006  . Obstructive apnea 07/09/2006    Past Surgical History:  Procedure Laterality Date  . CORONARY STENT PLACEMENT  02/2012  . LAPAROSCOPIC GASTRIC RESTRICTIVE DUODENAL PROCEDURE (DUODENAL SWITCH)    . LAPAROSCOPIC GASTRIC SLEEVE RESECTION  08/21/2013  . skin removal surgery     removal of extra skin approx 20 lbs  . TONSILLECTOMY AND ADENOIDECTOMY  1960    Family History        Family Status  Relation Status  . Mother Deceased at age 51  . Father Deceased at age 26  . Sister Deceased  . Paternal Grandmother Deceased  . Brother Deceased at age  58  . Daughter Alive  . Neg Hx         His family history includes Brain cancer in his father; COPD in his brother; Congenital heart disease in his sister; Heart attack in his mother and sister; Heart disease in his brother; Leukemia in his paternal grandmother.     Allergies  Allergen Reactions  . Atorvastatin Other (See Comments)    Muscle aches.  . Pregabalin Other (See Comments)    soreness     Current Outpatient Prescriptions:  .  Aspirin 81 MG EC tablet, Take 81 mg by mouth daily. , Disp: , Rfl:  .  CALCIUM PO, Take 700 mg by mouth daily.,  Disp: , Rfl:  .  dorzolamide (TRUSOPT) 2 % ophthalmic solution, Place 1 drop into both eyes 3 (three) times daily. , Disp: , Rfl:  .  econazole nitrate 1 % cream, Apply 1 application topically 2 (two) times daily as needed (for rash). , Disp: , Rfl:  .  latanoprost (XALATAN) 0.005 % ophthalmic solution, Place 1 drop into both eyes at bedtime., Disp: , Rfl:  .  lisinopril (PRINIVIL,ZESTRIL) 40 MG tablet, Take 40 mg by mouth daily. , Disp: , Rfl:  .  Multiple Vitamin (MULTIVITAMIN WITH MINERALS) TABS tablet, Take 1 tablet by mouth daily., Disp: , Rfl:  .  neomycin-bacitracin-polymyxin (NEOSPORIN) ointment, Apply 1 application topically 3 (three) times daily as needed for wound care. apply to eye, Disp: , Rfl:  .  nystatin cream (MYCOSTATIN), Apply 1 application topically 2 (two) times daily as needed (for rash). , Disp: , Rfl:  .  rosuvastatin (CRESTOR) 10 MG tablet, Take 10 mg by mouth daily. , Disp: , Rfl:  .  sertraline (ZOLOFT) 100 MG tablet, Take 1 tablet (100 mg total) by mouth daily., Disp: 90 tablet, Rfl: 3 .  sildenafil (REVATIO) 20 MG tablet, Take 100 mg by mouth daily as needed (for erectile dysfunction)., Disp: , Rfl:  .  tamsulosin (FLOMAX) 0.4 MG CAPS capsule, Take 1 capsule (0.4 mg total) by mouth daily., Disp: 30 capsule, Rfl: 0 .  zinc gluconate 50 MG tablet, Take 50 mg by mouth daily., Disp: , Rfl:    Patient Care Team: Chrismon, Vickki Muff, PA as PCP - General (Physician Assistant) Calton Dach, MD as Referring Physician (Optometry) Madelyn Brunner, MD as Consulting Physician (Internal Medicine) Bonner Puna, MD as Referring Physician (Specialist)      Objective:   Vitals: BP (!) 138/58 (BP Location: Right Arm, Patient Position: Sitting, Cuff Size: Large)   Pulse 60   Temp 99.1 F (37.3 C) (Oral)   Wt 252 lb 6.4 oz (114.5 kg)   BMI 39.53 kg/m    Wt Readings from Last 3 Encounters:  10/10/16 252 lb 6.4 oz (114.5 kg)  10/10/16 252 lb 6.4 oz (114.5 kg)    09/15/16 250 lb 8 oz (113.6 kg)    Vitals:   10/10/16 1332  BP: (!) 138/58  Pulse: 60  Temp: 99.1 F (37.3 C)  TempSrc: Oral  Weight: 252 lb 6.4 oz (114.5 kg)     Physical Exam  Constitutional: He is oriented to person, place, and time. He appears well-developed and well-nourished.  HENT:  Head: Normocephalic and atraumatic.  Right Ear: External ear normal.  Left Ear: External ear normal.  Nose: Nose normal.  Mouth/Throat: Oropharynx is clear and moist.  Eyes: Conjunctivae and EOM are normal. Pupils are equal, round, and reactive to light. Right eye exhibits no  discharge.  Neck: Normal range of motion. Neck supple. No tracheal deviation present. No thyromegaly present.  Cardiovascular: Normal rate, regular rhythm, normal heart sounds and intact distal pulses.   No murmur heard. Pulmonary/Chest: Effort normal and breath sounds normal. No respiratory distress. He has no wheezes. He has no rales. He exhibits no tenderness.  Abdominal: Soft. Bowel sounds are normal. He exhibits no distension and no mass. There is no tenderness. There is no rebound and no guarding.  Genitourinary:  Genitourinary Comments: DRE accomplished by urologist and PSA by his internist.   Musculoskeletal: Normal range of motion. He exhibits no edema or tenderness.  Some crepitus of the right knee. No swelling today and good pulses throughout.  Lymphadenopathy:    He has no cervical adenopathy.  Neurological: He is alert and oriented to person, place, and time. He has normal reflexes. No cranial nerve deficit. He exhibits normal muscle tone. Coordination normal.  Skin: Skin is warm and dry. No rash noted. No erythema.  Well healed scar across abdomen to each flank from removal of redundant skin since losing 250 lbs after bariatric surgery.   Psychiatric: He has a normal mood and affect. His behavior is normal. Judgment and thought content normal.   Depression Screen PHQ 2/9 Scores 10/10/2016 10/10/2016  PHQ - 2  Score 0 0  PHQ- 9 Score 0 -    Assessment & Plan:     Routine Health Maintenance and Physical Exam  Exercise Activities and Dietary recommendations Goals    . Exercise 3x per week (30 min per time)          Recommend increasing structured exercise to 3 days a week for 30 minutes.       Immunization History  Administered Date(s) Administered  . Influenza Split 03/22/2012  . Influenza, High Dose Seasonal PF 03/08/2015  . Influenza,inj,Quad PF,36+ Mos 01/21/2013, 03/14/2013  . Influenza-Unspecified 01/21/2013, 03/14/2013, 03/08/2015, 02/11/2016  . Pneumococcal Polysaccharide-23 07/08/2014, 11/05/2015  . Tdap 08/17/2014    Health Maintenance  Topic Date Due  . PNA vac Low Risk Adult (2 of 2 - PCV13) 11/04/2016  . INFLUENZA VACCINE  01/03/2017  . COLONOSCOPY  11/26/2023  . TETANUS/TDAP  08/16/2024  . Hepatitis C Screening  Completed     Discussed health benefits of physical activity, and encouraged him to engage in regular exercise appropriate for his age and condition.    -------------------------------------------------------------------- 1. Essential (primary) hypertension Tolerating Lisinopril 20 mg qd with good BP control. Has had follow up and Medicare Wellness Screening by his internist (Dr. Lisette Grinder) on 07-17-16. Labs done on 07-06-16 showed normal liver function, electrolytes, CBC and kidney function. Continue present medications and follow up as needed.  2. Primary osteoarthritis of right knee Much improved right knee pain since losing weight. Still has to use a cane for support. Follow up with orthopedist as needed.  3. Open-angle glaucoma of left eye, unspecified glaucoma stage, unspecified open-angle glaucoma type Still taking eye drops regularly (Dorzolamide and Latanoprost) and followed by ophthalmologist regularly.  4. Hyperlipidemia, unspecified hyperlipidemia type Still tolerating Crestor 10 mg qd and labs by Dr. Gilford Rile (internist) on 07-06-16 showed HDL  was 31, LDL 37 and triglycerides 98. Continue present dosage and follow up prn.  5. Bariatric surgery status Had gastrectomy sleeve procedure on 08-21-13 and duodenal switch on 11-04-15. States he has lost a total of 250 lbs and feels much better and energetic. No longer using motorized wheelchair often and breathing much better (still uses CPAP with  O2 bleed-in at night). In the process to see if he can stop the CPAP (getting another sleep study).    Vernie Murders, PA  [facility]

## 2016-10-10 NOTE — Progress Notes (Addendum)
Subjective:   William Armstrong is a 66 y.o. male who presents for an Initial Medicare Annual Wellness Visit.  Review of Systems  N/A  Cardiac Risk Factors include: advanced age (>104men, >71 women);dyslipidemia;hypertension;male gender;obesity (BMI >30kg/m2)    Objective:    Today's Vitals   10/10/16 1304 10/10/16 1310  BP: (!) 138/58   Pulse: 60   Temp: 99.1 F (37.3 C)   TempSrc: Oral   Weight: 252 lb 6.4 oz (114.5 kg)   Height: 5\' 7"  (1.702 m)   PainSc: 0-No pain 0-No pain   Body mass index is 39.53 kg/m.  Current Medications (verified) Outpatient Encounter Prescriptions as of 10/10/2016  Medication Sig  . Aspirin 81 MG EC tablet Take 81 mg by mouth daily.   . dorzolamide (TRUSOPT) 2 % ophthalmic solution Place 1 drop into both eyes 3 (three) times daily.   Marland Kitchen econazole nitrate 1 % cream Apply 1 application topically 2 (two) times daily as needed (for rash).   Marland Kitchen latanoprost (XALATAN) 0.005 % ophthalmic solution Place 1 drop into both eyes at bedtime.  Marland Kitchen lisinopril (PRINIVIL,ZESTRIL) 40 MG tablet Take 40 mg by mouth daily.   . Multiple Vitamin (MULTIVITAMIN WITH MINERALS) TABS tablet Take 1 tablet by mouth daily.  Marland Kitchen neomycin-bacitracin-polymyxin (NEOSPORIN) ointment Apply 1 application topically 3 (three) times daily as needed for wound care. apply to eye  . nystatin cream (MYCOSTATIN) Apply 1 application topically 2 (two) times daily as needed (for rash).   . rosuvastatin (CRESTOR) 10 MG tablet Take 10 mg by mouth daily.   . sertraline (ZOLOFT) 100 MG tablet Take 1 tablet (100 mg total) by mouth daily.  . sildenafil (REVATIO) 20 MG tablet Take 100 mg by mouth daily as needed (for erectile dysfunction).  . zinc gluconate 50 MG tablet Take 50 mg by mouth daily.  Marland Kitchen CALCIUM PO Take 700 mg by mouth daily.  . tamsulosin (FLOMAX) 0.4 MG CAPS capsule Take 1 capsule (0.4 mg total) by mouth daily. (Patient not taking: Reported on 10/10/2016)  . [DISCONTINUED] ciprofloxacin (CIPRO) 500  MG tablet Take 1 tablet (500 mg total) by mouth 2 (two) times daily. (Patient not taking: Reported on 09/12/2016)  . [DISCONTINUED] phenazopyridine (PYRIDIUM) 200 MG tablet Take 1 tablet (200 mg total) by mouth 3 (three) times daily as needed for pain. (Patient not taking: Reported on 09/15/2016)   No facility-administered encounter medications on file as of 10/10/2016.     Allergies (verified) Atorvastatin and Pregabalin   History: Past Medical History:  Diagnosis Date  . BCC (basal cell carcinoma of skin)   . CHF (congestive heart failure) ([place])   . Depression   . Glaucoma   . Heart attack ([place])   . History of kidney stones   . HTN (hypertension)   . Hypertension   . Kidney stone   . Sleep apnea    Past Surgical History:  Procedure Laterality Date  . CORONARY STENT PLACEMENT  02/2012  . LAPAROSCOPIC GASTRIC RESTRICTIVE DUODENAL PROCEDURE (DUODENAL SWITCH)    . LAPAROSCOPIC GASTRIC SLEEVE RESECTION  08/21/2013  . skin removal surgery     removal of extra skin approx 20 lbs  . TONSILLECTOMY AND ADENOIDECTOMY  1960   Family History  Problem Relation Age of Onset  . Heart attack Mother   . Brain cancer Father   . Heart attack Sister   . Congenital heart disease Sister   . Leukemia Paternal Grandmother   . COPD Brother   . Heart disease  Brother   . Prostate cancer Neg Hx   . Kidney cancer Neg Hx   . Bladder Cancer Neg Hx    Social History   Occupational History  . Not on file.   Social History Main Topics  . Smoking status: Never Smoker  . Smokeless tobacco: Never Used  . Alcohol use No  . Drug use: No  . Sexual activity: Not on file   Tobacco Counseling Counseling given: Not Answered   Activities of Daily Living In your present state of health, do you have any difficulty performing the following activities: 10/10/2016 09/15/2016  Hearing? N N  Vision? N N  Difficulty concentrating or making decisions? N N  Walking or climbing stairs? Y N  Dressing or bathing? N  N  Doing errands, shopping? N N  Preparing Food and eating ? N -  Using the Toilet? N -  In the past six months, have you accidently leaked urine? N -  Do you have problems with loss of bowel control? N -  Managing your Medications? N -  Managing your Finances? N -  Housekeeping or managing your Housekeeping? N -  Some recent data might be hidden    Immunizations and Health Maintenance Immunization History  Administered Date(s) Administered  . Influenza Split 03/22/2012  . Influenza, High Dose Seasonal PF 03/08/2015  . Influenza,inj,Quad PF,36+ Mos 01/21/2013, 03/14/2013  . Influenza-Unspecified 01/21/2013, 03/14/2013, 03/08/2015, 02/11/2016  . Pneumococcal Polysaccharide-23 07/08/2014, 11/05/2015  . Tdap 08/17/2014   Health Maintenance Due  Topic Date Due  . Hepatitis C Screening  1951/03/30    Patient Care Team: Zunaira Lamy, Vickki Muff, PA as PCP - General (Physician Assistant) Calton Dach, MD as Referring Physician (Optometry) Madelyn Brunner, MD as Consulting Physician (Internal Medicine) Bonner Puna, MD as Referring Physician (Specialist)  Indicate any recent Medical Services you may have received from other than [facility] providers in the past year (date may be approximate).    Assessment:   This is a routine wellness examination for William Armstrong.   Hearing/Vision screen Vision Screening Comments: Pt sees Dr Druscilla Brownie for vision checks three times yearly.   Dietary issues and exercise activities discussed: Current Exercise Habits: Home exercise routine, Type of exercise: walking, Time (Minutes): 15, Frequency (Times/Week): 5, Weekly Exercise (Minutes/Week): 75, Intensity: Mild  Goals    . Exercise 3x per week (30 min per time)          Recommend increasing structured exercise to 3 days a week for 30 minutes.      Depression Screen PHQ 2/9 Scores 10/10/2016 10/10/2016  PHQ - 2 Score 0 0  PHQ- 9 Score 0 -    Fall Risk Fall Risk  10/10/2016  Falls in the past year?  No    Cognitive Function:     6CIT Screen 10/10/2016  What Year? 0 points  What month? 0 points  What time? 0 points  Count back from 20 0 points  Months in reverse 0 points  Repeat phrase 8 points  Total Score 8    Screening Tests Health Maintenance  Topic Date Due  . Hepatitis C Screening  [birth year]  . PNA vac Low Risk Adult (2 of 2 - PCV13) 11/04/2016  . INFLUENZA VACCINE  01/03/2017  . COLONOSCOPY  11/26/2023  . TETANUS/TDAP  08/16/2024        Plan:  I have personally reviewed and addressed the Medicare Annual Wellness questionnaire and have noted the following in the patient's chart:  A.  Medical and social history B. Use of alcohol, tobacco or illicit drugs  C. Current medications and supplements D. Functional ability and status E.  Nutritional status F.  Physical activity G. Advance directives H. List of other physicians I.  Hospitalizations, surgeries, and ER visits in previous 12 months J.  [place] such as hearing and vision if needed, cognitive and depression L. Referrals and appointments - none  In addition, I have reviewed and discussed with patient certain preventive protocols, quality metrics, and best practice recommendations. A written personalized care plan for preventive services as well as general preventive health recommendations were provided to patient.  See attached scanned questionnaire for additional information.   Signed,  Fabio Neighbors, LPN Nurse Health Advisor   MD Recommendations: None.   Reviewed the Nurse Health Advisor's note and was available for consultation. Agree with documentation and plan. After office visit, patient remembered he had a physical with Dr. Jenny Reichmann B. Walker (internist). Review of his records indicate he did the Medicare Wellness Exam in Feb. 2018.

## 2016-10-10 NOTE — Patient Instructions (Signed)
Mr. William Armstrong , Thank you for taking time to come for your Medicare Wellness Visit. I appreciate your ongoing commitment to your health goals. Please review the following plan we discussed and let me know if I can assist you in the future.   Screening recommendations/referrals: Colonoscopy: completed 11/25/13, due 11/2023 Recommended yearly ophthalmology/optometry visit for glaucoma screening and checkup Recommended yearly dental visit for hygiene and checkup  Vaccinations: Influenza vaccine: due 02/2017 Pneumococcal vaccine: completed series Tdap vaccine: completed 08/17/2014 Shingles vaccine: declined    Advanced directives: Please bring a copy of your POA (Power of [place]) and/or Living Will to your next appointment.   Conditions/risks identified: Recommend increasing exercise to 3 days a weeks for 30 minutes.   Next appointment: None, need to schedule 1 year AWV.  Preventive Care 68 Years and Older, Male Preventive care refers to lifestyle choices and visits with your health care provider that can promote health and wellness. What does preventive care include?  A yearly physical exam. This is also called an annual well check.  Dental exams once or twice a year.  Routine eye exams. Ask your health care provider how often you should have your eyes checked.  Personal lifestyle choices, including:  Daily care of your teeth and gums.  Regular physical activity.  Eating a healthy diet.  Avoiding tobacco and drug use.  Limiting alcohol use.  Practicing safe sex.  Taking low doses of aspirin every day.  Taking vitamin and mineral supplements as recommended by your health care provider. What happens during an annual well check? The services and screenings done by your health care provider during your annual well check will depend on your age, overall health, lifestyle risk factors, and family history of disease. Counseling  Your health care provider may ask you questions  about your:  Alcohol use.  Tobacco use.  Drug use.  Emotional well-being.  Home and relationship well-being.  Sexual activity.  Eating habits.  History of falls.  Memory and ability to understand (cognition).  Work and work Statistician. Screening  You may have the following tests or measurements:  Height, weight, and BMI.  Blood pressure.  Lipid and cholesterol levels. These may be checked every 5 years, or more frequently if you are over 64 years old.  Skin check.  Lung cancer screening. You may have this screening every year starting at age 57 if you have a 30-pack-year history of smoking and currently smoke or have quit within the past 15 years.  Fecal occult blood test (FOBT) of the stool. You may have this test every year starting at age 65.  Flexible sigmoidoscopy or colonoscopy. You may have a sigmoidoscopy every 5 years or a colonoscopy every 10 years starting at age 44.  Prostate cancer screening. Recommendations will vary depending on your family history and other risks.  Hepatitis C blood test.  Hepatitis B blood test.  Sexually transmitted disease (STD) testing.  Diabetes screening. This is done by checking your blood sugar (glucose) after you have not eaten for a while (fasting). You may have this done every 1-3 years.  Abdominal aortic aneurysm (AAA) screening. You may need this if you are a current or former smoker.  Osteoporosis. You may be screened starting at age 79 if you are at high risk. Talk with your health care provider about your test results, treatment options, and if necessary, the need for more tests. Vaccines  Your health care provider may recommend certain vaccines, such as:  Influenza vaccine. This  is recommended every year.  Tetanus, diphtheria, and acellular pertussis (Tdap, Td) vaccine. You may need a Td booster every 10 years.  Zoster vaccine. You may need this after age 23.  Pneumococcal 13-valent conjugate (PCV13)  vaccine. One dose is recommended after age 79.  Pneumococcal polysaccharide (PPSV23) vaccine. One dose is recommended after age 48. Talk to your health care provider about which screenings and vaccines you need and how often you need them. This information is not intended to replace advice given to you by your health care provider. Make sure you discuss any questions you have with your health care provider. Document Released: 06/18/2015 Document Revised: 02/09/2016 Document Reviewed: 03/23/2015 Elsevier Interactive Patient Education  2017 [employer] Prevention in the Home Falls can cause injuries. They can happen to people of all ages. There are many things you can do to make your home safe and to help prevent falls. What can I do on the outside of my home?  Regularly fix the edges of walkways and driveways and fix any cracks.  Remove anything that might make you trip as you walk through a door, such as a raised step or threshold.  Trim any bushes or trees on the path to your home.  Use bright outdoor lighting.  Clear any walking paths of anything that might make someone trip, such as rocks or tools.  Regularly check to see if handrails are loose or broken. Make sure that both sides of any steps have handrails.  Any raised decks and porches should have guardrails on the edges.  Have any leaves, snow, or ice cleared regularly.  Use sand or salt on walking paths during winter.  Clean up any spills in your garage right away. This includes oil or grease spills. What can I do in the bathroom?  Use night lights.  Install grab bars by the toilet and in the tub and shower. Do not use towel bars as grab bars.  Use non-skid mats or decals in the tub or shower.  If you need to sit down in the shower, use a plastic, non-slip stool.  Keep the floor dry. Clean up any water that spills on the floor as soon as it happens.  Remove soap buildup in the tub or shower  regularly.  Attach bath mats securely with double-sided non-slip rug tape.  Do not have throw rugs and other things on the floor that can make you trip. What can I do in the bedroom?  Use night lights.  Make sure that you have a light by your bed that is easy to reach.  Do not use any sheets or blankets that are too big for your bed. They should not hang down onto the floor.  Have a firm chair that has side arms. You can use this for support while you get dressed.  Do not have throw rugs and other things on the floor that can make you trip. What can I do in the kitchen?  Clean up any spills right away.  Avoid walking on wet floors.  Keep items that you use a lot in easy-to-reach places.  If you need to reach something above you, use a strong step stool that has a grab bar.  Keep electrical cords out of the way.  Do not use floor polish or wax that makes floors slippery. If you must use wax, use non-skid floor wax.  Do not have throw rugs and other things on the floor that can make you  trip. What can I do with my stairs?  Do not leave any items on the stairs.  Make sure that there are handrails on both sides of the stairs and use them. Fix handrails that are broken or loose. Make sure that handrails are as long as the stairways.  Check any carpeting to make sure that it is firmly attached to the stairs. Fix any carpet that is loose or worn.  Avoid having throw rugs at the top or bottom of the stairs. If you do have throw rugs, attach them to the floor with carpet tape.  Make sure that you have a light switch at the top of the stairs and the bottom of the stairs. If you do not have them, ask someone to add them for you. What else can I do to help prevent falls?  Wear shoes that:  Do not have high heels.  Have rubber bottoms.  Are comfortable and fit you well.  Are closed at the toe. Do not wear sandals.  If you use a stepladder:  Make sure that it is fully  opened. Do not climb a closed stepladder.  Make sure that both sides of the stepladder are locked into place.  Ask someone to hold it for you, if possible.  Clearly mark and make sure that you can see:  Any grab bars or handrails.  First and last steps.  Where the edge of each step is.  Use tools that help you move around (mobility aids) if they are needed. These include:  Canes.  Walkers.  Scooters.  Crutches.  Turn on the lights when you go into a dark area. Replace any light bulbs as soon as they burn out.  Set up your furniture so you have a clear path. Avoid moving your furniture around.  If any of your floors are uneven, fix them.  If there are any pets around you, be aware of where they are.  Review your medicines with your doctor. Some medicines can make you feel dizzy. This can increase your chance of falling. Ask your doctor what other things that you can do to help prevent falls. This information is not intended to replace advice given to you by your health care provider. Make sure you discuss any questions you have with your health care provider. Document Released: 03/18/2009 Document Revised: 10/28/2015 Document Reviewed: 06/26/2014 Elsevier Interactive Patient Education  2017 [employer].

## 2016-10-16 ENCOUNTER — Ambulatory Visit
Admission: RE | Admit: 2016-10-16 | Discharge: 2016-10-16 | Disposition: A | Discharge status: Home / Self Care | Payer: Medicare Other | Source: Ambulatory Visit | Attending: Urology | Admitting: Urology

## 2016-10-16 DIAGNOSIS — N200 Calculus of kidney: Secondary | ICD-10-CM | POA: Diagnosis not present

## 2016-10-16 DIAGNOSIS — Q6102 Congenital multiple renal cysts: Secondary | ICD-10-CM | POA: Insufficient documentation

## 2016-10-16 DIAGNOSIS — N1330 Unspecified hydronephrosis: Secondary | ICD-10-CM | POA: Diagnosis not present

## 2016-10-17 ENCOUNTER — Telehealth: Payer: Self-pay

## 2016-10-17 DIAGNOSIS — N1330 Unspecified hydronephrosis: Secondary | ICD-10-CM

## 2016-10-17 NOTE — Telephone Encounter (Signed)
Spoke with pt wife in reference to RUS results and needing another one in 39mo. Wife also stated that she is about to have back surgery therefore pt will wait to address other stones at this time. Wife voiced understanding of whole conversation.

## 2016-10-17 NOTE — Telephone Encounter (Signed)
-----   Message from Nori Riis, PA-C sent at 10/17/2016  1:26 PM EDT ----- Please let patient know that he still has mild kidney swelling, but this may be what his kidneys look like normally.  I suggest repeating the RUS in 6 months.  Is he wanting to address his other stones at this time?

## 2016-11-18 ENCOUNTER — Inpatient Hospital Stay: Payer: Medicare Other | Admitting: Registered Nurse

## 2016-11-18 ENCOUNTER — Inpatient Hospital Stay: Payer: Medicare Other

## 2016-11-18 ENCOUNTER — Inpatient Hospital Stay
Admission: EM | Admit: 2016-11-18 | Discharge: 2016-11-19 | DRG: 690 | Disposition: A | Discharge status: Home / Self Care | Payer: Medicare Other | Attending: Internal Medicine | Admitting: Internal Medicine

## 2016-11-18 ENCOUNTER — Emergency Department: Payer: Medicare Other

## 2016-11-18 ENCOUNTER — Encounter
Admission: EM | Disposition: A | Discharge status: Home / Self Care | Payer: Self-pay | Source: Home / Self Care | Attending: Internal Medicine

## 2016-11-18 ENCOUNTER — Encounter: Payer: Self-pay | Admitting: Emergency Medicine

## 2016-11-18 DIAGNOSIS — Z8249 Family history of ischemic heart disease and other diseases of the circulatory system: Secondary | ICD-10-CM | POA: Diagnosis not present

## 2016-11-18 DIAGNOSIS — Z7982 Long term (current) use of aspirin: Secondary | ICD-10-CM

## 2016-11-18 DIAGNOSIS — E785 Hyperlipidemia, unspecified: Secondary | ICD-10-CM | POA: Diagnosis present

## 2016-11-18 DIAGNOSIS — Z9884 Bariatric surgery status: Secondary | ICD-10-CM | POA: Diagnosis not present

## 2016-11-18 DIAGNOSIS — R109 Unspecified abdominal pain: Secondary | ICD-10-CM

## 2016-11-18 DIAGNOSIS — N132 Hydronephrosis with renal and ureteral calculous obstruction: Secondary | ICD-10-CM

## 2016-11-18 DIAGNOSIS — I252 Old myocardial infarction: Secondary | ICD-10-CM

## 2016-11-18 DIAGNOSIS — N12 Tubulo-interstitial nephritis, not specified as acute or chronic: Secondary | ICD-10-CM

## 2016-11-18 DIAGNOSIS — Z806 Family history of leukemia: Secondary | ICD-10-CM | POA: Diagnosis not present

## 2016-11-18 DIAGNOSIS — G4730 Sleep apnea, unspecified: Secondary | ICD-10-CM | POA: Diagnosis present

## 2016-11-18 DIAGNOSIS — I5022 Chronic systolic (congestive) heart failure: Secondary | ICD-10-CM | POA: Diagnosis present

## 2016-11-18 DIAGNOSIS — Z85828 Personal history of other malignant neoplasm of skin: Secondary | ICD-10-CM

## 2016-11-18 DIAGNOSIS — R319 Hematuria, unspecified: Secondary | ICD-10-CM | POA: Diagnosis present

## 2016-11-18 DIAGNOSIS — I110 Hypertensive heart disease with heart failure: Secondary | ICD-10-CM | POA: Diagnosis present

## 2016-11-18 DIAGNOSIS — H4010X0 Unspecified open-angle glaucoma, stage unspecified: Secondary | ICD-10-CM | POA: Diagnosis present

## 2016-11-18 DIAGNOSIS — Z808 Family history of malignant neoplasm of other organs or systems: Secondary | ICD-10-CM | POA: Diagnosis not present

## 2016-11-18 DIAGNOSIS — R1032 Left lower quadrant pain: Secondary | ICD-10-CM | POA: Diagnosis present

## 2016-11-18 DIAGNOSIS — N136 Pyonephrosis: Principal | ICD-10-CM | POA: Diagnosis present

## 2016-11-18 DIAGNOSIS — Z87442 Personal history of urinary calculi: Secondary | ICD-10-CM

## 2016-11-18 DIAGNOSIS — Z825 Family history of asthma and other chronic lower respiratory diseases: Secondary | ICD-10-CM | POA: Diagnosis not present

## 2016-11-18 DIAGNOSIS — Z888 Allergy status to other drugs, medicaments and biological substances status: Secondary | ICD-10-CM

## 2016-11-18 DIAGNOSIS — N400 Enlarged prostate without lower urinary tract symptoms: Secondary | ICD-10-CM | POA: Diagnosis present

## 2016-11-18 DIAGNOSIS — J449 Chronic obstructive pulmonary disease, unspecified: Secondary | ICD-10-CM | POA: Diagnosis present

## 2016-11-18 DIAGNOSIS — Z955 Presence of coronary angioplasty implant and graft: Secondary | ICD-10-CM

## 2016-11-18 DIAGNOSIS — IMO0001 Reserved for inherently not codable concepts without codable children: Secondary | ICD-10-CM

## 2016-11-18 DIAGNOSIS — F329 Major depressive disorder, single episode, unspecified: Secondary | ICD-10-CM | POA: Diagnosis present

## 2016-11-18 DIAGNOSIS — H409 Unspecified glaucoma: Secondary | ICD-10-CM | POA: Diagnosis present

## 2016-11-18 DIAGNOSIS — Z0000 Encounter for general adult medical examination without abnormal findings: Secondary | ICD-10-CM

## 2016-11-18 HISTORY — PX: CYSTOSCOPY WITH STENT PLACEMENT: SHX5790

## 2016-11-18 HISTORY — PX: CYSTOSCOPY W/ RETROGRADES: SHX1426

## 2016-11-18 LAB — CBC
HEMATOCRIT: 38.8 % — AB (ref 40.0–52.0)
HEMOGLOBIN: 12.8 g/dL — AB (ref 13.0–18.0)
MCH: 28.3 pg (ref 26.0–34.0)
MCHC: 33 g/dL (ref 32.0–36.0)
MCV: 85.9 fL (ref 80.0–100.0)
Platelets: 214 10*3/uL (ref 150–440)
RBC: 4.51 MIL/uL (ref 4.40–5.90)
RDW: 16.3 % — ABNORMAL HIGH (ref 11.5–14.5)
WBC: 11.1 10*3/uL — ABNORMAL HIGH (ref 3.8–10.6)

## 2016-11-18 LAB — URINALYSIS, COMPLETE (UACMP) WITH MICROSCOPIC
BILIRUBIN URINE: NEGATIVE
Glucose, UA: NEGATIVE mg/dL
KETONES UR: NEGATIVE mg/dL
LEUKOCYTES UA: NEGATIVE
NITRITE: POSITIVE — AB
PROTEIN: 30 mg/dL — AB
SPECIFIC GRAVITY, URINE: 1.018 (ref 1.005–1.030)
pH: 5 (ref 5.0–8.0)

## 2016-11-18 LAB — COMPREHENSIVE METABOLIC PANEL
ALBUMIN: 4 g/dL (ref 3.5–5.0)
ALT: 29 U/L (ref 17–63)
ANION GAP: 6 (ref 5–15)
AST: 36 U/L (ref 15–41)
Alkaline Phosphatase: 75 U/L (ref 38–126)
BUN: 17 mg/dL (ref 6–20)
CO2: 26 mmol/L (ref 22–32)
Calcium: 8.6 mg/dL — ABNORMAL LOW (ref 8.9–10.3)
Chloride: 105 mmol/L (ref 101–111)
Creatinine, Ser: 0.66 mg/dL (ref 0.61–1.24)
GFR calc Af Amer: >60 mL/min (ref >60)
GFR calc non Af Amer: >60 mL/min (ref >60)
GLUCOSE: 138 mg/dL — AB (ref 65–99)
POTASSIUM: 3.9 mmol/L (ref 3.5–5.1)
SODIUM: 137 mmol/L (ref 135–145)
Total Bilirubin: 0.6 mg/dL (ref 0.3–1.2)
Total Protein: 6.7 g/dL (ref 6.5–8.1)

## 2016-11-18 LAB — LACTIC ACID, PLASMA: Lactic Acid, Venous: 1 mmol/L (ref 0.5–1.9)

## 2016-11-18 SURGERY — CYSTOSCOPY, WITH STENT INSERTION
Anesthesia: General | Laterality: Left | Wound class: Clean Contaminated

## 2016-11-18 MED ORDER — CEFAZOLIN SODIUM 1 G IJ SOLR
INTRAMUSCULAR | Status: AC
  Filled 2016-11-18: qty 20

## 2016-11-18 MED ORDER — OXYCODONE HCL 5 MG PO TABS: 5.0000 mg | ORAL_TABLET | Freq: Four times a day (QID) | ORAL | Status: DC | PRN

## 2016-11-18 MED ORDER — FENTANYL CITRATE (PF) 100 MCG/2ML IJ SOLN
INTRAMUSCULAR | Status: DC | PRN
  Administered 2016-11-18: 50 ug via INTRAVENOUS

## 2016-11-18 MED ORDER — SODIUM CHLORIDE 0.9 % IV SOLN: INTRAVENOUS | Status: DC

## 2016-11-18 MED ORDER — LIDOCAINE HCL (PF) 2 % IJ SOLN
INTRAMUSCULAR | Status: AC
  Filled 2016-11-18: qty 2

## 2016-11-18 MED ORDER — SODIUM CHLORIDE 0.9 % IJ SOLN
INTRAMUSCULAR | Status: AC
  Filled 2016-11-18: qty 10

## 2016-11-18 MED ORDER — CEFAZOLIN SODIUM 1 G IJ SOLR
INTRAMUSCULAR | Status: DC | PRN
  Administered 2016-11-18: 2 g via INTRAMUSCULAR

## 2016-11-18 MED ORDER — ONDANSETRON HCL 4 MG/2ML IJ SOLN
INTRAMUSCULAR | Status: AC
  Filled 2016-11-18: qty 2

## 2016-11-18 MED ORDER — ACETAMINOPHEN 650 MG RE SUPP: 650.0000 mg | Freq: Four times a day (QID) | RECTAL | Status: DC | PRN

## 2016-11-18 MED ORDER — PROPOFOL 10 MG/ML IV BOLUS
INTRAVENOUS | Status: DC | PRN
  Administered 2016-11-18: 180 mg via INTRAVENOUS

## 2016-11-18 MED ORDER — ONDANSETRON HCL 4 MG/2ML IJ SOLN
4.0000 mg | Freq: Once | INTRAMUSCULAR | Status: AC
  Administered 2016-11-18: 4 mg via INTRAVENOUS
  Filled 2016-11-18: qty 2

## 2016-11-18 MED ORDER — HYDROMORPHONE BOLUS VIA INFUSION: 0.5000 mg | INTRAVENOUS | Status: DC | PRN

## 2016-11-18 MED ORDER — PHENYLEPHRINE HCL 10 MG/ML IJ SOLN
INTRAMUSCULAR | Status: AC
  Filled 2016-11-18: qty 1

## 2016-11-18 MED ORDER — PHENYLEPHRINE HCL 10 MG/ML IJ SOLN
INTRAMUSCULAR | Status: AC
Start: 2016-11-18 — End: 2016-11-18
  Administered 2016-11-18: 0.1 mg via INTRAVENOUS
  Filled 2016-11-18: qty 1

## 2016-11-18 MED ORDER — FENTANYL CITRATE (PF) 100 MCG/2ML IJ SOLN
INTRAMUSCULAR | Status: AC
Start: 2016-11-18 — End: 2016-11-18
  Filled 2016-11-18: qty 2

## 2016-11-18 MED ORDER — IOTHALAMATE MEGLUMINE 17.2 % UR SOLN
URETHRAL | Status: DC | PRN
Start: 2016-11-18 — End: 2016-11-18
  Administered 2016-11-18: 10 mL via URETHRAL

## 2016-11-18 MED ORDER — ATROPINE SULFATE 0.4 MG/ML IV SOSY
PREFILLED_SYRINGE | INTRAVENOUS | Status: AC
  Filled 2016-11-18: qty 2.5

## 2016-11-18 MED ORDER — DEXTROSE 5 % IV SOLN: 1.0000 g | Freq: Once | INTRAVENOUS | Status: DC

## 2016-11-18 MED ORDER — ONDANSETRON HCL 4 MG PO TABS: 4.0000 mg | ORAL_TABLET | Freq: Four times a day (QID) | ORAL | Status: DC | PRN

## 2016-11-18 MED ORDER — MIDAZOLAM HCL 2 MG/2ML IJ SOLN
INTRAMUSCULAR | Status: DC | PRN
  Administered 2016-11-18: 2 mg via INTRAVENOUS

## 2016-11-18 MED ORDER — LIDOCAINE HCL (CARDIAC) 20 MG/ML IV SOLN
INTRAVENOUS | Status: DC | PRN
  Administered 2016-11-18: 100 mg via INTRAVENOUS

## 2016-11-18 MED ORDER — MORPHINE SULFATE (PF) 4 MG/ML IV SOLN
4.0000 mg | INTRAVENOUS | Status: DC | PRN
  Administered 2016-11-18: 4 mg via INTRAVENOUS
  Filled 2016-11-18: qty 1

## 2016-11-18 MED ORDER — SUCCINYLCHOLINE CHLORIDE 20 MG/ML IJ SOLN
INTRAMUSCULAR | Status: AC
  Filled 2016-11-18: qty 1

## 2016-11-18 MED ORDER — ONDANSETRON HCL 4 MG/2ML IJ SOLN: 4.0000 mg | Freq: Four times a day (QID) | INTRAMUSCULAR | Status: DC | PRN

## 2016-11-18 MED ORDER — FENTANYL CITRATE (PF) 100 MCG/2ML IJ SOLN: 25.0000 ug | INTRAMUSCULAR | Status: DC | PRN

## 2016-11-18 MED ORDER — PIPERACILLIN-TAZOBACTAM 3.375 G IVPB
3.3750 g | Freq: Three times a day (TID) | INTRAVENOUS | Status: DC
  Administered 2016-11-19: 3.375 g via INTRAVENOUS
  Filled 2016-11-18 (×3): qty 50

## 2016-11-18 MED ORDER — ROSUVASTATIN CALCIUM 10 MG PO TABS
10.0000 mg | ORAL_TABLET | Freq: Every evening | ORAL | Status: DC
  Filled 2016-11-18: qty 1

## 2016-11-18 MED ORDER — EPHEDRINE SULFATE 50 MG/ML IJ SOLN
INTRAMUSCULAR | Status: DC | PRN
  Administered 2016-11-18 (×2): 5 mg via INTRAVENOUS
  Administered 2016-11-18: 10 mg via INTRAVENOUS

## 2016-11-18 MED ORDER — ONDANSETRON HCL 4 MG/2ML IJ SOLN
INTRAMUSCULAR | Status: DC | PRN
  Administered 2016-11-18: 5 mg via INTRAVENOUS

## 2016-11-18 MED ORDER — MIDAZOLAM HCL 2 MG/2ML IJ SOLN
INTRAMUSCULAR | Status: AC
  Filled 2016-11-18: qty 2

## 2016-11-18 MED ORDER — LACTATED RINGERS IV SOLN
INTRAVENOUS | Status: DC | PRN
  Administered 2016-11-18: 21:00:00 via INTRAVENOUS

## 2016-11-18 MED ORDER — SODIUM CHLORIDE 0.9 % IV SOLN
INTRAVENOUS | Status: DC
  Administered 2016-11-18: via INTRAVENOUS

## 2016-11-18 MED ORDER — HYDROMORPHONE HCL 1 MG/ML IJ SOLN: 0.5000 mg | INTRAMUSCULAR | Status: DC | PRN

## 2016-11-18 MED ORDER — PROPOFOL 10 MG/ML IV BOLUS
INTRAVENOUS | Status: AC
  Filled 2016-11-18: qty 20

## 2016-11-18 MED ORDER — PHENYLEPHRINE HCL 10 MG/ML IJ SOLN
0.1000 mg | Freq: Once | INTRAMUSCULAR | Status: AC
  Administered 2016-11-18: 0.1 mg via INTRAVENOUS

## 2016-11-18 MED ORDER — ONDANSETRON HCL 4 MG/2ML IJ SOLN
4.0000 mg | Freq: Once | INTRAMUSCULAR | Status: AC
  Administered 2016-11-18: 4 mg via INTRAVENOUS

## 2016-11-18 MED ORDER — PIPERACILLIN-TAZOBACTAM 3.375 G IVPB 30 MIN
3.3750 g | Freq: Once | INTRAVENOUS | Status: AC
Start: 2016-11-18 — End: 2016-11-18
  Administered 2016-11-18: 3.375 g via INTRAVENOUS
  Filled 2016-11-18: qty 50

## 2016-11-18 MED ORDER — SODIUM CHLORIDE 0.9 % IV BOLUS (SEPSIS)
1000.0000 mL | Freq: Once | INTRAVENOUS | Status: AC
  Administered 2016-11-18: 1000 mL via INTRAVENOUS

## 2016-11-18 MED ORDER — ATROPINE SULFATE 0.4 MG/ML IJ SOLN
INTRAMUSCULAR | Status: DC | PRN
  Administered 2016-11-18: 0.2 mg via INTRAVENOUS

## 2016-11-18 MED ORDER — TRAMADOL HCL 50 MG PO TABS: 100.0000 mg | ORAL_TABLET | Freq: Four times a day (QID) | ORAL | Status: DC | PRN

## 2016-11-18 MED ORDER — ONDANSETRON HCL 4 MG/2ML IJ SOLN: 4.0000 mg | Freq: Once | INTRAMUSCULAR | Status: DC | PRN

## 2016-11-18 MED ORDER — GLYCOPYRROLATE 0.2 MG/ML IJ SOLN
INTRAMUSCULAR | Status: DC | PRN
  Administered 2016-11-18: 0.2 mg via INTRAVENOUS

## 2016-11-18 MED ORDER — PHENYLEPHRINE HCL 10 MG/ML IJ SOLN
INTRAMUSCULAR | Status: DC | PRN
  Administered 2016-11-18 (×2): 100 ug via INTRAVENOUS

## 2016-11-18 MED ORDER — ACETAMINOPHEN 10 MG/ML IV SOLN
1000.0000 mg | Freq: Once | INTRAVENOUS | Status: AC
  Administered 2016-11-18: 1000 mg via INTRAVENOUS
  Filled 2016-11-18: qty 100

## 2016-11-18 MED ORDER — ACETAMINOPHEN 325 MG PO TABS: 650.0000 mg | ORAL_TABLET | Freq: Four times a day (QID) | ORAL | Status: DC | PRN

## 2016-11-18 SURGICAL SUPPLY — 17 items
BAG DRAIN CYSTO-URO LG1000N (MISCELLANEOUS) ×3 IMPLANT
CANISTER SUCT LVC 12 LTR MEDI- (MISCELLANEOUS) ×3 IMPLANT
DRAPE XRAY CASSETTE 23X24 (DRAPES) IMPLANT
GOWN STRL REUS W/ TWL LRG LVL4 (GOWN DISPOSABLE) ×1 IMPLANT
GOWN STRL REUS W/ TWL XL LVL3 (GOWN DISPOSABLE) ×1 IMPLANT
GOWN STRL REUS W/TWL LRG LVL4 (GOWN DISPOSABLE) ×2
GOWN STRL REUS W/TWL XL LVL3 (GOWN DISPOSABLE) ×2
KIT RM TURNOVER CYSTO AR (KITS) ×3 IMPLANT
NS IRRIG 500ML POUR BTL (IV SOLUTION) ×3 IMPLANT
PACK CYSTO AR (MISCELLANEOUS) ×3 IMPLANT
SENSORWIRE 0.038 NOT ANGLED (WIRE) ×9
SET CYSTO W/LG BORE CLAMP LF (SET/KITS/TRAYS/PACK) ×3 IMPLANT
SOL .9 NS 3000ML IRR  AL (IV SOLUTION) ×2
SOL .9 NS 3000ML IRR UROMATIC (IV SOLUTION) ×1 IMPLANT
STENT URET 6FRX26 CONTOUR (STENTS) ×3 IMPLANT
WATER STERILE IRR 1000ML POUR (IV SOLUTION) ×3 IMPLANT
WIRE SENSOR 0.038 NOT ANGLED (WIRE) ×3 IMPLANT

## 2016-11-18 NOTE — Anesthesia Postprocedure Evaluation (Signed)
Anesthesia Post Note  Patient: William Armstrong  Procedure(s) Performed: Procedure(s) (LRB): CYSTOSCOPY WITH STENT PLACEMENT (Left) CYSTOSCOPY WITH RETROGRADE PYELOGRAM (Left)  Patient location during evaluation: PACU Anesthesia Type: General Level of consciousness: awake and alert Pain management: pain level controlled Vital Signs Assessment: post-procedure vital signs reviewed and stable Respiratory status: spontaneous breathing and respiratory function stable Cardiovascular status: stable Anesthetic complications: no     Last Vitals:  Vitals:   11/18/16 2000 11/18/16 2133  BP: (!) 107/48 (!) 107/41  Pulse: 61 91  Resp: (!) 21 (!) 24  Temp:  36.3 C    Last Pain:  Vitals:   11/18/16 1640  TempSrc:   PainSc: 3                  Malik Ruffino K

## 2016-11-18 NOTE — Progress Notes (Signed)
Pharmacy Antibiotic Note  William Armstrong is a 66 y.o. male admitted on 11/18/2016 with Hematuria and flank pain.   Pharmacy has been consulted for Zosyn dosing.  Plan: Will start patient on Zosyn 3.375 IV EI every 8 hours.   Height: 5\' 7"  (170.2 cm) Weight: 250 lb (113.4 kg) IBW/kg (Calculated) : 66.1  Temp (24hrs), Avg:98.1 F (36.7 C), Min:98.1 F (36.7 C), Max:98.1 F (36.7 C)   Recent Labs Lab 11/18/16 1441 11/18/16 1523 11/18/16 1749  WBC 11.1*  --   --   CREATININE  --  0.66  --   LATICACIDVEN  --   --  1.0    Estimated Creatinine Clearance: 106.3 mL/min (by C-G formula based on SCr of 0.66 mg/dL).    Allergies  Allergen Reactions  . Atorvastatin Other (See Comments)    Muscle aches.  . Pregabalin Other (See Comments)    soreness    Antimicrobials this admission: 6/16 Zosyn  >>  Dose adjustments this admission:   Microbiology results: 6/16 UCx: sent     Thank you for allowing pharmacy to be a part of this patient's care.  Pernell Dupre, PharmD, BCPS Clinical Pharmacist 11/18/2016 7:38 PM

## 2016-11-18 NOTE — Transfer of Care (Signed)
Immediate Anesthesia Transfer of Care Note  Patient: William Armstrong  Procedure(s) Performed: Procedure(s): CYSTOSCOPY WITH STENT PLACEMENT (Left) CYSTOSCOPY WITH RETROGRADE PYELOGRAM (Left)  Patient Location: PACU  Anesthesia Type:General  Level of Consciousness: awake, alert  and oriented  Airway & Oxygen Therapy: Patient Spontanous Breathing and Patient connected to face mask oxygen  Post-op Assessment: Report given to RN and Post -op Vital signs reviewed and stable  Post vital signs: Reviewed and stable  Last Vitals:  Vitals:   11/18/16 2000 11/18/16 2133  BP: (!) 107/48 (!) 107/41  Pulse: 61 91  Resp: (!) 21 (!) 24  Temp:  36.3 C    Last Pain:  Vitals:   11/18/16 1640  TempSrc:   PainSc: 3          Complications: No apparent anesthesia complications

## 2016-11-18 NOTE — Discharge Instructions (Signed)

## 2016-11-18 NOTE — Anesthesia Post-op Follow-up Note (Cosign Needed)
Anesthesia QCDR form completed.        

## 2016-11-18 NOTE — Brief Op Note (Signed)
11/18/2016  9:17 PM  PATIENT:  William Armstrong  66 y.o. male  PRE-OPERATIVE DIAGNOSIS:  Left distal stone with possible ureteral rupture and infection   POST-OPERATIVE DIAGNOSIS:  Left distal stone with obstruction   PROCEDURE:  Procedure(s): CYSTOSCOPY WITH STENT PLACEMENT (Left) CYSTOSCOPY WITH RETROGRADE PYELOGRAM (Left)  SURGEON:  Surgeon(s) and Role:    Irine Seal, MD - Primary  PHYSICIAN ASSISTANT:   ASSISTANTS: none   ANESTHESIA:   general  EBL:  No intake/output data recorded.  BLOOD ADMINISTERED:none  DRAINS: 6 x 26 left JJ stent   LOCAL MEDICATIONS USED:  NONE  SPECIMEN:  No Specimen  DISPOSITION OF SPECIMEN:  N/A  COUNTS:  YES  TOURNIQUET:  * No tourniquets in log *  DICTATION: .Other Dictation: Dictation Number [id number]  PLAN OF CARE: Admit for overnight observation  PATIENT DISPOSITION:  PACU - hemodynamically stable.   Delay start of Pharmacological VTE agent (>24hrs) due to surgical blood loss or risk of bleeding: not applicable

## 2016-11-18 NOTE — H&P (Signed)
[facility] NAME: William Armstrong    MR#:  [id number]  DATE OF BIRTH:  03-15-1951  DATE OF ADMISSION:  11/18/2016  PRIMARY CARE PHYSICIAN: Chrismon, Vickki Muff, PA   REQUESTING/REFERRING PHYSICIAN: Quentin Cornwall  CHIEF COMPLAINT:  Flank pain and hematuria  HISTORY OF PRESENT ILLNESS:  William Armstrong  is a 66 y.o. male with a known history of COPD, hypertension, hyponatremia, congestive heart failure is presenting to the ED with a chief complaint of left lower abdominal pain radiating to the left flank and  having dark colored urine. Patient noticed bloody urine today. CT abdomen has revealed obstructive uropathy with left ureteral stone 13 mm size ED physician has discussed with on-call urologist is going to see the patient today as soon as possible  PAST MEDICAL HISTORY:   Past Medical History:  Diagnosis Date  . BCC (basal cell carcinoma of skin)   . CHF (congestive heart failure) ([place])   . Depression   . Glaucoma   . Heart attack ([place])   . History of kidney stones   . HTN (hypertension)   . Hypertension   . Kidney stone   . Sleep apnea     PAST SURGICAL HISTOIRY:   Past Surgical History:  Procedure Laterality Date  . CORONARY STENT PLACEMENT  02/2012  . LAPAROSCOPIC GASTRIC RESTRICTIVE DUODENAL PROCEDURE (DUODENAL SWITCH)    . LAPAROSCOPIC GASTRIC SLEEVE RESECTION  08/21/2013  . skin removal surgery     removal of extra skin approx 20 lbs  . TONSILLECTOMY AND ADENOIDECTOMY  1960    SOCIAL HISTORY:   Social History  Substance Use Topics  . Smoking status: Never Smoker  . Smokeless tobacco: Never Used  . Alcohol use No    FAMILY HISTORY:   Family History  Problem Relation Age of Onset  . Heart attack Mother   . Brain cancer Father   . Heart attack Sister   . Congenital heart disease Sister   . Leukemia Paternal Grandmother   . COPD Brother   . Heart disease Brother   . Prostate cancer Neg Hx   . Kidney  cancer Neg Hx   . Bladder Cancer Neg Hx     DRUG ALLERGIES:   Allergies  Allergen Reactions  . Atorvastatin Other (See Comments)    Muscle aches.  . Pregabalin Other (See Comments)    soreness    REVIEW OF SYSTEMS:  CONSTITUTIONAL: No fever, fatigue or weakness.  EYES: No blurred or double vision.  EARS, NOSE, AND THROAT: No tinnitus or ear pain.  RESPIRATORY: No cough, shortness of breath, wheezing or hemoptysis.  CARDIOVASCULAR: No chest pain, orthopnea, edema.  GASTROINTESTINAL: Reporting left lower quadrant abdominal pain and left-sided back pain No nausea, vomiting, diarrhea GENITOURINARY: No dysuria, reporting hematuria.  ENDOCRINE: No polyuria, nocturia,  HEMATOLOGY: No anemia, easy bruising or bleeding SKIN: No rash or lesion. MUSCULOSKELETAL: No joint pain or arthritis.   NEUROLOGIC: No tingling, numbness, weakness.  PSYCHIATRY: No anxiety or depression.   MEDICATIONS AT HOME:   Prior to Admission medications   Medication Sig Start Date End Date Taking? Authorizing Provider  Aspirin 81 MG EC tablet Take 81 mg by mouth daily.  07/09/06  Yes [provider]  CALCIUM PO Take 1 tablet by mouth daily.    Yes [provider]  dorzolamide (TRUSOPT) 2 % ophthalmic solution Place 1 drop into both eyes 3 (three) times daily.    Yes [provider]  econazole nitrate 1 % cream Apply 1 application topically 2 (two) times daily as needed (for rash).    Yes [provider]  latanoprost (XALATAN) 0.005 % ophthalmic solution Place 1 drop into both eyes at bedtime. 05/11/16  Yes [provider]  lisinopril (PRINIVIL,ZESTRIL) 40 MG tablet Take 40 mg by mouth daily.  08/19/16  Yes [provider]  Multiple Vitamin (MULTIVITAMIN WITH MINERALS) TABS tablet Take 1 tablet by mouth daily.   Yes [provider]  neomycin-bacitracin-polymyxin (NEOSPORIN) ointment Apply 1 application topically 3 (three) times daily as needed for wound  care. apply to eye   Yes [provider]  nystatin cream (MYCOSTATIN) Apply 1 application topically 2 (two) times daily as needed (for rash).  04/06/15  Yes [provider]  rosuvastatin (CRESTOR) 10 MG tablet Take 10 mg by mouth daily.    Yes [provider]  sertraline (ZOLOFT) 100 MG tablet Take 1 tablet (100 mg total) by mouth daily. 08/21/16  Yes Chrismon, Vickki Muff, PA  sildenafil (REVATIO) 20 MG tablet Take 100 mg by mouth daily as needed (for erectile dysfunction).   Yes [provider]  tamsulosin (FLOMAX) 0.4 MG CAPS capsule Take 1 capsule (0.4 mg total) by mouth daily. 09/12/16  Yes McGowan, Larene Beach A, PA-C  zinc gluconate 50 MG tablet Take 50 mg by mouth daily.   Yes [provider]      VITAL SIGNS:  Blood pressure (!) 128/44, pulse 60, temperature 98.1 F (36.7 C), temperature source Oral, resp. rate (!) 21, height 5\' 7"  (1.702 m), weight 113.4 kg (250 lb), SpO2 91 %.  PHYSICAL EXAMINATION:  GENERAL:  66 y.o.-year-old patient lying in the bed with no acute distress.  EYES: Pupils equal, round, reactive to light and accommodation. No scleral icterus. Extraocular muscles intact.  HEENT: Head atraumatic, normocephalic. Oropharynx and nasopharynx clear.  NECK:  Supple, no jugular venous distention. No thyroid enlargement, no tenderness.  LUNGS: Normal breath sounds bilaterally, no wheezing, rales,rhonchi or crepitation. No use of accessory muscles of respiration.  CARDIOVASCULAR: S1, S2 normal. No murmurs, rubs, or gallops.  ABDOMEN: Soft, Left lower quadrant is tender, no rebound tenderness nondistended. Bowel sounds present. Left flank is tender  EXTREMITIES: No pedal edema, cyanosis, or clubbing.  NEUROLOGIC: Cranial nerves II through XII are intact. Muscle strength 5/5 in all extremities. Sensation intact. Gait not checked.  PSYCHIATRIC: The patient is alert and oriented x 3.  SKIN: No obvious rash, lesion, or ulcer.   LABORATORY  PANEL:   CBC  Recent Labs Lab 11/18/16 1441  WBC 11.1*  HGB 12.8*  HCT 38.8*  PLT 214   ------------------------------------------------------------------------------------------------------------------  Chemistries   Recent Labs Lab 11/18/16 1523  NA 137  K 3.9  CL 105  CO2 26  GLUCOSE 138*  BUN 17  CREATININE 0.66  CALCIUM 8.6*  AST 36  ALT 29  ALKPHOS 75  BILITOT 0.6   ------------------------------------------------------------------------------------------------------------------  Cardiac Enzymes No results for input(s): TROPONINI in the last 168 hours. ------------------------------------------------------------------------------------------------------------------  RADIOLOGY:  Dg Chest Portable 1 View  Result Date: 11/18/2016 CLINICAL DATA:  Preop, kidney stones history of CHF and coronary disease with stent placement EXAM: PORTABLE CHEST 1 VIEW COMPARISON:  07/01/2015, 06/22/2015 FINDINGS: Stable elevation of right diaphragm. Bibasilar atelectasis or scarring similar to prior radiograph. Heart size within normal limits. Enlarged central pulmonary artery's. Aortic atherosclerosis. No pneumothorax. IMPRESSION: 1. Stable elevation of right diaphragm with bibasilar atelectasis or scar 2. Slightly enlarged appearing hilar vessels raises possibility  of pulmonary artery hypertension. No overt pulmonary edema Electronically Signed   By: Donavan Foil M.D.   On: 11/18/2016 18:31   Ct Renal Stone Study  Result Date: 11/18/2016 CLINICAL DATA:  Left flank pain.  Hematuria. EXAM: CT ABDOMEN AND PELVIS WITHOUT CONTRAST TECHNIQUE: Multidetector CT imaging of the abdomen and pelvis was performed following the standard protocol without IV contrast. COMPARISON:  Ultrasound 10/16/2016 FINDINGS: Lower chest: Advanced calcific atherosclerotic disease of the coronary arteries. Mildly enlarged heart. Hepatobiliary: 2.2 cm left hepatic cyst. A second 1 cm hypoattenuated lesion in the  dome of the liver, too small to be actually characterize by CT. Pancreas: Somewhat atrophic. Spleen: Normal in size without focal abnormality. Adrenals/Urinary Tract: Normal adrenal glands. Bilateral nephrolithiasis, with renal stones in the lower pole of the right kidney measuring 9.6 mm and lower pole of the left kidney measuring 9 mm. No evidence of right hydronephrosis. Moderate left hydronephrosis and perinephric fat stranding. Moderate left hydroureter. Fat stranding along the dilated ureter noted as well. Obstructive 13 mm stone in the distal left ureter. Bulbous dilation of the left ureter just upstream to the level of obstruction with small amount of periureteral free fluid. Stomach/Bowel: No evidence of bowel wall thickening, distention, or inflammatory changes. 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 Vascular/Lymphatic: Aortic atherosclerosis. No enlarged abdominal or pelvic lymph nodes. Reproductive: Prostate is unremarkable. Other: Longitudinal fluid collection along the superficial fascia of the right rectus abdominus measures approximately 14 cm in craniocaudal dimension. Fat stranding in the right flank and right anterior abdominal wall may be postsurgical. Musculoskeletal: Multilevel osteoarthritic changes and posterior facet arthropathy of the lumbosacral spine. IMPRESSION: Left obstructive uropathy caused by 13 mm distal left ureteral stone. Bulbous dilation of the left ureter just upstream to the level of the obstruction with a small amount of periureteral free fluid. These may represent postobstructive inflammatory changes versus micro rupture. Bilateral nephrolithiasis. Longitudinal fluid collection within the right paramedian anterior abdominal wall, measuring 14 cm in craniocaudal dimension. Please correlate to clinical exam findings. 1 cm hypoattenuated lesion within the dome of the liver, incompletely characterized. Calcific atherosclerotic disease of the aorta. Electronically Signed    By: Fidela Salisbury M.D.   On: 11/18/2016 16:17    EKG:   Orders placed or performed during the hospital encounter of 11/18/16  . ED EKG  . ED EKG  . EKG 12-Lead  . EKG 12-Lead  . EKG 12-Lead  . EKG 12-Lead    IMPRESSION AND PLAN:   Jevante Hollibaugh  is a 66 y.o. male with a known history of COPD, hypertension, hyponatremia, congestive heart failure is presenting to the ED with a chief complaint of left lower abdominal pain radiating to the left flank and  having dark colored urine. Patient noticed bloody urine today. CT abdomen has revealed obstructive uropathy with left ureteral stone 13 mm size   #Flank pain with hematuria secondary to obstructive uropathy from left ureteral stone Admit to MedSurg unit Nothing by mouth him IV fluids, IV Zosyn Urine culture and sensitivity ordered in the ED ED physician has discussed with on-call urologist dr.Wrenn, who is going to see the patient as soon as possible today and did discuss with the family regarding the surgical options Pain management as needed monitor hemoglobin and hematocrit  #COPD Currently no exacerbation, provide breathing treatments as needed  #Essential hypertension Resume home medication lisinopril 40 mg by mouth once daily and titrate as needed  #Hyperlipidemia Resume home medication statin -Crestor  All the records  are reviewed and case discussed with ED provider. Management plans discussed with the patient, family and they are in agreement.  CODE STATUS: fc ,Wife healthcare power of attorney  TOTAL TIME TAKING CARE OF THIS PATIENT: 45 minutes.   Note: This dictation was prepared with Dragon dictation along with smaller phrase technology. Any transcriptional errors that result from this process are unintentional.  Nicholes Mango M.D on 11/18/2016 at 7:19 PM  Between 7am to 6pm - Pager - [phone]  After 6pm go to [email or web address] - password [email or web address] [facility]  Office   [phone]  CC: Primary care physician; [name], PA

## 2016-11-18 NOTE — Op Note (Signed)
NAMEMarland Kitchen  William Armstrong, William Armstrong NO.:  [id number]  MEDICAL RECORD NO.:  [id number]  LOCATION:  ED17A                        FACILITY:  [facility]  PHYSICIAN:  Marshall Cork. Jeffie Pollock, M.D.    DATE OF BIRTH:  [birth year]  DATE OF PROCEDURE:  11/18/2016 DATE OF DISCHARGE:                              OPERATIVE REPORT   Patient of Dr. Ulice Bold Mody's.  PROCEDURE PERFORMED: 1. Cystoscopy with left retrograde pyelogram and interpretation. 2. Insertion of left double-J stent.  SURGEON:  Marshall Cork. Jeffie Pollock, M.D.  PREOPERATIVE DIAGNOSIS:  A 13 mm left distal ureteral stone with possible infection, possible ureteral perforation.  POSTOPERATIVE DIAGNOSIS:  Left distal ureteral stone with obstruction. No perforation seen.  SURGEON:  Marshall Cork. Jeffie Pollock, M.D.  ANESTHESIA:  General.  SPECIMEN:  None.  DRAINS:  A 6-French x 26 cm left double-J stent.  BLOOD LOSS:  Minimal.  COMPLICATIONS:  None.  INDICATIONS:  [name] is a 67 year old white male with a history of stones, who presented to the emergency room with gross hematuria and clots and left flank pain.  He had had dark urine for approximately 2 days.  He had a CT scan which revealed a 13 mm left distal ureteral stone with obstruction.  There was some periureteral fluid, which raised the question of a distal ureteral perforation/rupture.  He had bilateral renal calculi.  Additionally, his urine had too numerous count red cells, was nitrite positive, and many bacteria.  It was felt that urgent stenting was indicated because of the possibility of infection and the perforation.  FINDINGS AND PROCEDURE:  He was given 2 g of Ancef in the operating room.  General anesthetic was induced.  He was placed in lithotomy position.  His perineum and genitalia were prepped with Betadine solution, was draped in usual sterile fashion.  Cystoscopy was performed using a 23-French scope and a 30-degree lens. Examination revealed a normal urethra with the  exception of very mild bulbar stricture.  External sphincter was intact.  Prostatic urethra was approximately 3 cm in length with bilobar hyperplasia and a small middle lobe.  Examination of bladder revealed a smooth wall.  There was a very deep orange urine with some tan debris that was aspirated from the bladder.  Bladder was irrigated out until clear.  No mucosal lesions were identified.  The right ureteral orifice was unremarkable.  The left ureteral orifice was oozing a reddish orange fluid.  The left ureteral orifice was cannulated with 5-French open-end catheter and contrast was instilled.  This revealed a filling defect in the distal ureter consistent with a stone.  Proximal to this, there was some tortuosity with proximal dilation.  A guidewire was then passed up the open-end catheter to the kidney.  The tortuosity straightened out with the wire.  The open-end catheter was inserted over the wire to the kidney.  The wire was removed.  There was a brisk hydronephrotic drip.  The wire was then reinserted and the open-end catheter was removed.  A 6- French 26 cm Contour double-J stent was inserted without difficulty to the kidney under fluoroscopic guidance.  The wire was removed leaving good coil in the kidney and  a good coil in the bladder.  With the stent in place, there was brisk efflux of very orange somewhat turbid urine. The bladder was then partially drained.  The scope was removed.  The patient was taken down from lithotomy position.  His anesthetic was reversed.  He was moved to the recovery room in stable condition.  There were no complications.     Marshall Cork. Jeffie Pollock, M.D.     JJW/MEDQ  D:  11/18/2016  T:  11/18/2016  Job:  [id number]

## 2016-11-18 NOTE — ED Notes (Signed)
Patient to the OR.

## 2016-11-18 NOTE — Anesthesia Procedure Notes (Signed)
Procedure Name: LMA Insertion Date/Time: 11/18/2016 8:55 PM Performed by: Hedda Slade Pre-anesthesia Checklist: Patient identified, Patient being monitored, Timeout performed, Emergency Drugs available and Suction available Patient Re-evaluated:Patient Re-evaluated prior to inductionOxygen Delivery Method: Circle system utilized Preoxygenation: Pre-oxygenation with 100% oxygen Intubation Type: IV induction Ventilation: Mask ventilation without difficulty LMA: LMA inserted LMA Size: 5.0 Tube type: Oral Number of attempts: 1 Placement Confirmation: positive ETCO2 and breath sounds checked- equal and bilateral Tube secured with: Tape Dental Injury: Teeth and Oropharynx as per pre-operative assessment

## 2016-11-18 NOTE — Anesthesia Preprocedure Evaluation (Signed)
Anesthesia Evaluation  Patient identified by MRN, date of birth, ID band Patient awake    Reviewed: Allergy & Precautions, NPO status , Patient's Chart, lab work & pertinent test results  Airway Mallampati: II       Dental   Pulmonary sleep apnea and Continuous Positive Airway Pressure Ventilation , COPD,           Cardiovascular hypertension, Pt. on medications + CAD, + Past MI, + Cardiac Stents and +CHF (hx)       Neuro/Psych Depression    GI/Hepatic negative GI ROS, Neg liver ROS,   Endo/Other  negative endocrine ROS  Renal/GU Renal disease (stones)     Musculoskeletal   Abdominal   Peds  Hematology negative hematology ROS (+)   Anesthesia Other Findings   Reproductive/Obstetrics                             Anesthesia Physical Anesthesia Plan  ASA: III and emergent  Anesthesia Plan: General   Post-op Pain Management:    Induction: Intravenous  PONV Risk Score and Plan: 2 and Ondansetron and Dexamethasone  Airway Management Planned: LMA  Additional Equipment:   Intra-op Plan:   Post-operative Plan:   Informed Consent: I have reviewed the patients History and Physical, chart, labs and discussed the procedure including the risks, benefits and alternatives for the proposed anesthesia with the patient or authorized representative who has indicated his/her understanding and acceptance.     Plan Discussed with:   Anesthesia Plan Comments:         Anesthesia Quick Evaluation

## 2016-11-18 NOTE — Consult Note (Signed)
Subjective: CC: left flank pain and dark urine  Hx: I was asked to see Mr. Christiana by Dr. Merlyn Lot.   The patient had the onset of dark urine about 2 days ago.  He had the onset of flank and LLQ  pain today and began to pass clots.  He has had pain medicine and is comfortable now but it was 9/10 prior to the med.  He has had no urgency or frequency.  He has had some nausea today.  He has had no fever or chills  A CT shows a 26mm left distal stone with proximal obstruction and renal stones.   There is a question of periureteral fluid adjacent to the distal ureter.   He had an [place] in May that showed bilateral renal stones and he has been seen by Natasha Mead at [facility].  He passed 2 small stones in May and had stones 35 years ago.  He has had no  GU surgery and he is voiding well.   The UA was nit+ with TNTC RBC and many bacteria.   His lactate is 1.  WBC count is 11.1K.   Cr is normal.  ROS:  Review of Systems  Constitutional: Negative for chills and fever.  Respiratory: Negative for shortness of breath.   Cardiovascular: Negative for chest pain and leg swelling.  Gastrointestinal: Positive for abdominal pain and nausea.  Genitourinary: Positive for flank pain and hematuria.  All other systems reviewed and are negative.   Allergies  Allergen Reactions  . Atorvastatin Other (See Comments)    Muscle aches.  . Pregabalin Other (See Comments)    soreness    Past Medical History:  Diagnosis Date  . BCC (basal cell carcinoma of skin)   . CHF (congestive heart failure) ([place])   . Depression   . Glaucoma   . Heart attack ([place])   . History of kidney stones   . HTN (hypertension)   . Hypertension   . Kidney stone   . Sleep apnea     Past Surgical History:  Procedure Laterality Date  . CORONARY STENT PLACEMENT  02/2012  . LAPAROSCOPIC GASTRIC RESTRICTIVE DUODENAL PROCEDURE (DUODENAL SWITCH)    . LAPAROSCOPIC GASTRIC SLEEVE RESECTION  08/21/2013  . skin removal surgery     removal  of extra skin approx 20 lbs  . TONSILLECTOMY AND ADENOIDECTOMY  1960    Social History   Social History  . Marital status: Married    Spouse name: N/A  . Number of children: N/A  . Years of education: N/A   Occupational History  . Not on file.   Social History Main Topics  . Smoking status: Never Smoker  . Smokeless tobacco: Never Used  . Alcohol use No  . Drug use: No  . Sexual activity: Not on file   Other Topics Concern  . Not on file   Social History Narrative  . No narrative on file    Family History  Problem Relation Age of Onset  . Heart attack Mother   . Brain cancer Father   . Heart attack Sister   . Congenital heart disease Sister   . Leukemia Paternal Grandmother   . COPD Brother   . Heart disease Brother   . Prostate cancer Neg Hx   . Kidney cancer Neg Hx   . Bladder Cancer Neg Hx     Anti-infectives: Anti-infectives    Start     Dose/Rate Route Frequency Ordered Stop   11/18/16 2200  piperacillin-tazobactam (ZOSYN) IVPB 3.375 g     3.375 g 12.5 mL/hr over 240 Minutes Intravenous Every 8 hours 11/18/16 1918     11/18/16 1800  piperacillin-tazobactam (ZOSYN) IVPB 3.375 g     3.375 g 100 mL/hr over 30 Minutes Intravenous  Once 11/18/16 1749 11/18/16 1846   11/18/16 1730  cefTRIAXone (ROCEPHIN) 1 g in dextrose 5 % 50 mL IVPB  Status:  Discontinued     1 g 100 mL/hr over 30 Minutes Intravenous  Once 11/18/16 1723 11/18/16 1749      Current Facility-Administered Medications  Medication Dose Route Frequency Provider Last Rate Last Dose  . 0.9 %  sodium chloride infusion   Intravenous Continuous Merlyn Lot, MD      . morphine 4 MG/ML injection 4 mg  4 mg Intravenous Q3H PRN Merlyn Lot, MD   4 mg at 11/18/16 1610  . piperacillin-tazobactam (ZOSYN) IVPB 3.375 g  3.375 g Intravenous Q8H Gouru, Aruna, MD      . rosuvastatin (CRESTOR) tablet 10 mg  10 mg Oral Daily Gouru, Aruna, MD       Current Outpatient Prescriptions  Medication Sig  Dispense Refill  . Aspirin 81 MG EC tablet Take 81 mg by mouth daily.     Marland Kitchen CALCIUM PO Take 1 tablet by mouth daily.     . dorzolamide (TRUSOPT) 2 % ophthalmic solution Place 1 drop into both eyes 3 (three) times daily.     Marland Kitchen econazole nitrate 1 % cream Apply 1 application topically 2 (two) times daily as needed (for rash).     Marland Kitchen latanoprost (XALATAN) 0.005 % ophthalmic solution Place 1 drop into both eyes at bedtime.    Marland Kitchen lisinopril (PRINIVIL,ZESTRIL) 40 MG tablet Take 40 mg by mouth daily.     . Multiple Vitamin (MULTIVITAMIN WITH MINERALS) TABS tablet Take 1 tablet by mouth daily.    Marland Kitchen neomycin-bacitracin-polymyxin (NEOSPORIN) ointment Apply 1 application topically 3 (three) times daily as needed for wound care. apply to eye    . nystatin cream (MYCOSTATIN) Apply 1 application topically 2 (two) times daily as needed (for rash).     . rosuvastatin (CRESTOR) 10 MG tablet Take 10 mg by mouth daily.     . sertraline (ZOLOFT) 100 MG tablet Take 1 tablet (100 mg total) by mouth daily. 90 tablet 3  . sildenafil (REVATIO) 20 MG tablet Take 100 mg by mouth daily as needed (for erectile dysfunction).    . tamsulosin (FLOMAX) 0.4 MG CAPS capsule Take 1 capsule (0.4 mg total) by mouth daily. 30 capsule 0  . zinc gluconate 50 MG tablet Take 50 mg by mouth daily.     Past medical, surgical, family and social history reviewed.     Objective: Vital signs in last 24 hours: Temp:  [98.1 F (36.7 C)] 98.1 F (36.7 C) (06/16 1435) Pulse Rate:  [55-78] 60 (06/16 1900) Resp:  [16-23] 21 (06/16 1900) BP: (128-156)/(44-69) 128/44 (06/16 1900) SpO2:  [90 %-96 %] 91 % (06/16 1900) Weight:  [113.4 kg (250 lb)] 113.4 kg (250 lb) (06/16 1436)  Intake/Output from previous day: No intake/output data recorded. Intake/Output this shift: No intake/output data recorded.   Physical Exam  Constitutional: He is oriented to person, place, and time and well-developed, well-nourished, and in no distress.  HENT:   Head: Normocephalic and atraumatic.  Neck: Normal range of motion. Neck supple. No thyromegaly present.  Cardiovascular: Normal rate and regular rhythm.   Murmur heard. Pulmonary/Chest: Effort normal and breath  sounds normal. No respiratory distress.  Abdominal: Soft. Bowel sounds are normal. There is no tenderness.  Obese with a transverse paniculectomy scar.   Musculoskeletal: Normal range of motion. He exhibits no edema or tenderness.  Lymphadenopathy:    He has no cervical adenopathy.       Right: No supraclavicular adenopathy present.       Left: No supraclavicular adenopathy present.  Neurological: He is alert and oriented to person, place, and time.  Skin: Skin is warm and dry.  Psychiatric: Mood and affect normal.  Vitals reviewed.   Lab Results:   Recent Labs  11/18/16 1441  WBC 11.1*  HGB 12.8*  HCT 38.8*  PLT 214   BMET  Recent Labs  11/18/16 1523  NA 137  K 3.9  CL 105  CO2 26  GLUCOSE 138*  BUN 17  CREATININE 0.66  CALCIUM 8.6*   PT/INR No results for input(s): LABPROT, INR in the last 72 hours. ABG No results for input(s): PHART, HCO3 in the last 72 hours.  Invalid input(s): PCO2, PO2  Studies/Results: Dg Chest Portable 1 View  Result Date: 11/18/2016 CLINICAL DATA:  Preop, kidney stones history of CHF and coronary disease with stent placement EXAM: PORTABLE CHEST 1 VIEW COMPARISON:  07/01/2015, 06/22/2015 FINDINGS: Stable elevation of right diaphragm. Bibasilar atelectasis or scarring similar to prior radiograph. Heart size within normal limits. Enlarged central pulmonary artery's. Aortic atherosclerosis. No pneumothorax. IMPRESSION: 1. Stable elevation of right diaphragm with bibasilar atelectasis or scar 2. Slightly enlarged appearing hilar vessels raises possibility of pulmonary artery hypertension. No overt pulmonary edema Electronically Signed   By: Donavan Foil M.D.   On: 11/18/2016 18:31   Ct Renal Stone Study  Result Date:  11/18/2016 CLINICAL DATA:  Left flank pain.  Hematuria. EXAM: CT ABDOMEN AND PELVIS WITHOUT CONTRAST TECHNIQUE: Multidetector CT imaging of the abdomen and pelvis was performed following the standard protocol without IV contrast. COMPARISON:  Ultrasound 10/16/2016 FINDINGS: Lower chest: Advanced calcific atherosclerotic disease of the coronary arteries. Mildly enlarged heart. Hepatobiliary: 2.2 cm left hepatic cyst. A second 1 cm hypoattenuated lesion in the dome of the liver, too small to be actually characterize by CT. Pancreas: Somewhat atrophic. Spleen: Normal in size without focal abnormality. Adrenals/Urinary Tract: Normal adrenal glands. Bilateral nephrolithiasis, with renal stones in the lower pole of the right kidney measuring 9.6 mm and lower pole of the left kidney measuring 9 mm. No evidence of right hydronephrosis. Moderate left hydronephrosis and perinephric fat stranding. Moderate left hydroureter. Fat stranding along the dilated ureter noted as well. Obstructive 13 mm stone in the distal left ureter. Bulbous dilation of the left ureter just upstream to the level of obstruction with small amount of periureteral free fluid. Stomach/Bowel: No evidence of bowel wall thickening, distention, or inflammatory changes. 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 Vascular/Lymphatic: Aortic atherosclerosis. No enlarged abdominal or pelvic lymph nodes. Reproductive: Prostate is unremarkable. Other: Longitudinal fluid collection along the superficial fascia of the right rectus abdominus measures approximately 14 cm in craniocaudal dimension. Fat stranding in the right flank and right anterior abdominal wall may be postsurgical. Musculoskeletal: Multilevel osteoarthritic changes and posterior facet arthropathy of the lumbosacral spine. IMPRESSION: Left obstructive uropathy caused by 13 mm distal left ureteral stone. Bulbous dilation of the left ureter just upstream to the level of the obstruction with a small amount  of periureteral free fluid. These may represent postobstructive inflammatory changes versus micro rupture. Bilateral nephrolithiasis. Longitudinal fluid collection within the right paramedian anterior abdominal  wall, measuring 14 cm in craniocaudal dimension. Please correlate to clinical exam findings. 1 cm hypoattenuated lesion within the dome of the liver, incompletely characterized. Calcific atherosclerotic disease of the aorta. Electronically Signed   By: Fidela Salisbury M.D.   On: 11/18/2016 16:17   I have reviewed the CT films and report and labs and discussed the case with Dr. Quentin Cornwall.    Assessment: 37mm Left distal ureteral stone with periureteral fluid and nit+ UA with bacteriuria but no UTI/Sepsis symptoms.    I am going to get him set up for cystoscopy with left ureteral stenting this evening and he will need subsequent URS or ESL.   I reviewed the risks of the procedure including bleeding, infection, ureteral injury, need for a stent, secondary procedures, thrombotic events and anesthetic complications.   He will be admitted to the Hospitalist service for IV Abx.    CC: Dr. Merlyn Lot.    Luian Schumpert J 11/18/2016 [phone]

## 2016-11-18 NOTE — ED Triage Notes (Signed)
Pt presents to the ER with complaints of lf flank pain, history of kidney stones reports in May had an US showed 2 kidney stones about 13 and 11, reports today passing blood cots took Pyridium and tamsulosin prior to arrival

## 2016-11-18 NOTE — ED Provider Notes (Signed)
[facility] Emergency Department Provider Note    First MD Initiated Contact with Patient 11/18/16 1502     (approximate)  I have reviewed the triage vital signs and the nursing notes.   HISTORY  Chief Complaint Flank Pain    HPI William Armstrong is a 66 y.o. male history kidney stones presents with acute onset left flank pain radiating down to his left groin. Says he has a history of kidney stones but has never had pain is bad.   Did feel nauseated but no vomiting. No shortness of breath. No recent fevers but did notice hematuria and passing blood clots.  Has never had surgery or any urological procedures. Really rates the pain as 10 out of 10 in severity. Patient presenting from home.   Past Medical History:  Diagnosis Date  . BCC (basal cell carcinoma of skin)   . CHF (congestive heart failure) ([place])   . Depression   . Glaucoma   . Heart attack ([place])   . History of kidney stones   . HTN (hypertension)   . Hypertension   . Kidney stone   . Sleep apnea    Family History  Problem Relation Age of Onset  . Heart attack Mother   . Brain cancer Father   . Heart attack Sister   . Congenital heart disease Sister   . Leukemia Paternal Grandmother   . COPD Brother   . Heart disease Brother   . Prostate cancer Neg Hx   . Kidney cancer Neg Hx   . Bladder Cancer Neg Hx    Past Surgical History:  Procedure Laterality Date  . CORONARY STENT PLACEMENT  02/2012  . LAPAROSCOPIC GASTRIC RESTRICTIVE DUODENAL PROCEDURE (DUODENAL SWITCH)    . LAPAROSCOPIC GASTRIC SLEEVE RESECTION  08/21/2013  . skin removal surgery     removal of extra skin approx 20 lbs  . TONSILLECTOMY AND ADENOIDECTOMY  1960   Patient Active Problem List   Diagnosis Date Noted  . Chronic respiratory failure with hypoxia ([place]) 01/06/2015  . Chronic bronchitis ([place]) 11/30/2014  . COPD, moderate ([place]) 11/30/2014  . H/O: depression 11/30/2014  . Old myocardial infarction 11/30/2014  .  HLD (hyperlipidemia) 11/30/2014  . Low back pain 11/30/2014  . Extreme obesity 11/30/2014  . Arthritis of knee, degenerative 11/30/2014  . Back pain, thoracic 11/30/2014  . Dermatophytosis of groin 11/30/2014  . Adiposity 07/08/2014  . Bariatric surgery status 07/08/2014  . Morbid obesity ([place]) 08/21/2013  . Glaucoma 03/14/2012  . Arthritis, degenerative 03/14/2012  . Ventricular fibrillation ([place]) 03/14/2012  . Arteriosclerosis of coronary artery 02/26/2012  . CAFL (chronic airflow limitation) ([place]) 02/26/2012  . BP (high blood pressure) 02/26/2012  . Chronic obstructive pulmonary disease ([place]) 02/26/2012  . Essential (primary) hypertension 07/09/2006  . Open-angle glaucoma 07/09/2006  . Hypoxemia 07/09/2006  . Obstructive apnea 07/09/2006      Prior to Admission medications   Medication Sig Start Date End Date Taking? Authorizing Provider  Aspirin 81 MG EC tablet Take 81 mg by mouth daily.  07/09/06   [provider]  CALCIUM PO Take 700 mg by mouth daily.    [provider]  dorzolamide (TRUSOPT) 2 % ophthalmic solution Place 1 drop into both eyes 3 (three) times daily.     [provider]  econazole nitrate 1 % cream Apply 1 application topically 2 (two) times daily as needed (for rash).     [provider]  latanoprost (XALATAN) 0.005 % ophthalmic  solution Place 1 drop into both eyes at bedtime. 05/11/16   [provider]  lisinopril (PRINIVIL,ZESTRIL) 40 MG tablet Take 40 mg by mouth daily.  08/19/16   [provider]  Multiple Vitamin (MULTIVITAMIN WITH MINERALS) TABS tablet Take 1 tablet by mouth daily.    [provider]  neomycin-bacitracin-polymyxin (NEOSPORIN) ointment Apply 1 application topically 3 (three) times daily as needed for wound care. apply to eye    [provider]  nystatin cream (MYCOSTATIN) Apply 1 application topically 2 (two) times daily as needed (for rash).  04/06/15   [provider]  rosuvastatin (CRESTOR) 10 MG tablet Take 10 mg by mouth daily.     [provider]  sertraline (ZOLOFT) 100 MG tablet Take 1 tablet (100 mg total) by mouth daily. 08/21/16   Chrismon, Vickki Muff, PA  sildenafil (REVATIO) 20 MG tablet Take 100 mg by mouth daily as needed (for erectile dysfunction).    [provider]  tamsulosin (FLOMAX) 0.4 MG CAPS capsule Take 1 capsule (0.4 mg total) by mouth daily. 09/12/16   Zara Council A, PA-C  zinc gluconate 50 MG tablet Take 50 mg by mouth daily.    [provider]    Allergies Atorvastatin and Pregabalin    Social History Social History  Substance Use Topics  . Smoking status: Never Smoker  . Smokeless tobacco: Never Used  . Alcohol use No    Review of Systems Patient denies headaches, rhinorrhea, blurry vision, numbness, shortness of breath, chest pain, edema, cough, abdominal pain, nausea, vomiting, diarrhea, dysuria, fevers, rashes or hallucinations unless otherwise stated above in HPI. ____________________________________________   PHYSICAL EXAM:  VITAL SIGNS: Vitals:   11/18/16 1435  BP: (!) 146/54  Pulse: 78  Resp: 20  Temp: 98.1 F (36.7 C)    Constitutional: Alert and oriented.uncomfortable but in no acute distress. Eyes: Conjunctivae are normal.  Head: Atraumatic. Nose: No congestion/rhinnorhea. Mouth/Throat: Mucous membranes are moist.   Neck: No stridor. Painless ROM.  Cardiovascular: Normal rate, regular rhythm. Grossly normal heart sounds.  Good peripheral circulation. Respiratory: Normal respiratory effort.  No retractions. Lungs CTAB. Gastrointestinal: Soft and nontender. No erythema , nontendner fluctuant mass in midline above previous surgical scar No distention. No abdominal bruits. + left CVA tenderness. Genitourinary:  Musculoskeletal: No lower extremity tenderness nor edema.  No joint effusions. Neurologic:  Normal speech and language. No gross focal neurologic  deficits are appreciated. No facial droop Skin:  Skin is warm, dry and intact. No rash noted. Psychiatric: Mood and affect are normal. Speech and behavior are normal.  ____________________________________________   LABS (all labs ordered are listed, but only abnormal results are displayed)  Results for orders placed or performed during the hospital encounter of 11/18/16 (from the past 24 hour(s))  CBC     Status: Abnormal   Collection Time: 11/18/16  2:41 PM  Result Value Ref Range   WBC 11.1 (H) 3.8 - 10.6 K/uL   RBC 4.51 4.40 - 5.90 MIL/uL   Hemoglobin 12.8 (L) 13.0 - 18.0 g/dL   HCT 38.8 (L) 40.0 - 52.0 %   MCV 85.9 80.0 - 100.0 fL   MCH 28.3 26.0 - 34.0 pg   MCHC 33.0 32.0 - 36.0 g/dL   RDW 16.3 (H) 11.5 - 14.5 %   Platelets 214 150 - 440 K/uL  Urinalysis, Complete w Microscopic     Status: Abnormal   Collection Time: 11/18/16  3:23 PM  Result Value Ref Range  Color, Urine AMBER (A) YELLOW   APPearance TURBID (A) CLEAR   Specific Gravity, Urine 1.018 1.005 - 1.030   pH 5.0 5.0 - 8.0   Glucose, UA NEGATIVE NEGATIVE mg/dL   Hgb urine dipstick LARGE (A) NEGATIVE   Bilirubin Urine NEGATIVE NEGATIVE   Ketones, ur NEGATIVE NEGATIVE mg/dL   Protein, ur 30 (A) NEGATIVE mg/dL   Nitrite POSITIVE (A) NEGATIVE   Leukocytes, UA NEGATIVE NEGATIVE   RBC / HPF TOO NUMEROUS TO COUNT 0 - 5 RBC/hpf   WBC, UA 0-5 0 - 5 WBC/hpf   Bacteria, UA MANY (A) NONE SEEN   Squamous Epithelial / LPF 0-5 (A) NONE SEEN  Comprehensive metabolic panel     Status: Abnormal   Collection Time: 11/18/16  3:23 PM  Result Value Ref Range   Sodium 137 135 - 145 mmol/L   Potassium 3.9 3.5 - 5.1 mmol/L   Chloride 105 101 - 111 mmol/L   CO2 26 22 - 32 mmol/L   Glucose, Bld 138 (H) 65 - 99 mg/dL   BUN 17 6 - 20 mg/dL   Creatinine, Ser 0.66 0.61 - 1.24 mg/dL   Calcium 8.6 (L) 8.9 - 10.3 mg/dL   Total Protein 6.7 6.5 - 8.1 g/dL   Albumin 4.0 3.5 - 5.0 g/dL   AST 36 15 - 41 U/L   ALT 29 17 - 63 U/L    Alkaline Phosphatase 75 38 - 126 U/L   Total Bilirubin 0.6 0.3 - 1.2 mg/dL   GFR calc non Af Amer >60 >60 mL/min   GFR calc Af Amer >60 >60 mL/min   Anion gap 6 5 - 15  Lactic acid, plasma     Status: None   Collection Time: 11/18/16  5:49 PM  Result Value Ref Range   Lactic Acid, Venous 1.0 0.5 - 1.9 mmol/L   ____________________________________________  EKG My review and personal interpretation at Time: 17:41   Indication: flank pain  Rate: 70  Rhythm: sinus Axis: normal Other: no STEMI, RBBB, otherwise normal intervals ____________________________________________  RADIOLOGY  I personally reviewed all radiographic images ordered to evaluate for the above acute complaints and reviewed radiology reports and findings.  These findings were personally discussed with the patient.  Please see medical record for radiology report.  ____________________________________________   PROCEDURES  Procedure(s) performed:  Procedures    Critical Care performed: no ____________________________________________   INITIAL IMPRESSION / ASSESSMENT AND PLAN / ED COURSE  Pertinent labs & imaging results that were available during my care of the patient were reviewed by me and considered in my medical decision making (see chart for details).  DDX: stone, pyelo, spasm, strain, abscess  William Armstrong is a 66 y.o. who presents to the ED with Hx of chf, and stones p/w acute left flank pain. No fevers, no systemic symptoms. + urinary symptoms. Denies trauma or injury. Afebrile in ED. Exam as above. Flank TTP, otherwise abdominal exam is benign. No peritoneal signs. Possible kidney stone, cystitis, or pyelonephritis. Clinical picture is not consistent with appendicitis, diverticulitis, pancreatitis, cholecystitis, bowel perforation, aortic dissection, splenic injury or acute abdominal process at this time.  The patient will be placed on continuous pulse oximetry and telemetry for monitoring.  Laboratory  evaluation will be sent to evaluate for the above complaints.     Clinical Course as of Nov 18 1829  Sat Nov 18, 2016  1734 Discussed results of scan with patient. He is a known fluid conduction along the anterior abdominal  wall that has required drainage in the past. Does not seem to be having any acute pain from that region at this time. Spoke with urology regarding the patient's presentation and my concern for pyelonephritis with obstruction. Dr. Jeffie Pollock has currently agreed to evaluate patient and agrees with admission to hospital for IV antibiotics.  Have discussed with the patient and available family all diagnostics and treatments performed thus far and all questions were answered to the best of my ability. The patient demonstrates understanding and agreement with plan.   [PR]    Clinical Course User Index [PR] Merlyn Lot, MD     ____________________________________________   FINAL CLINICAL IMPRESSION(S) / ED DIAGNOSES  Final diagnoses:  Ureteral stone with hydronephrosis  Flank pain, acute  Pyelonephritis      NEW MEDICATIONS STARTED DURING THIS VISIT:  New Prescriptions   No medications on file     Note:  This document was prepared using Dragon voice recognition software and may include unintentional dictation errors.    Merlyn Lot, MD 11/18/16 [phone]

## 2016-11-19 DIAGNOSIS — N12 Tubulo-interstitial nephritis, not specified as acute or chronic: Secondary | ICD-10-CM

## 2016-11-19 DIAGNOSIS — N132 Hydronephrosis with renal and ureteral calculous obstruction: Secondary | ICD-10-CM | POA: Diagnosis not present

## 2016-11-19 DIAGNOSIS — N136 Pyonephrosis: Secondary | ICD-10-CM | POA: Diagnosis not present

## 2016-11-19 DIAGNOSIS — R1032 Left lower quadrant pain: Secondary | ICD-10-CM

## 2016-11-19 LAB — CBC
HEMATOCRIT: 33 % — AB (ref 40.0–52.0)
Hemoglobin: 11 g/dL — ABNORMAL LOW (ref 13.0–18.0)
MCH: 28.8 pg (ref 26.0–34.0)
MCHC: 33.4 g/dL (ref 32.0–36.0)
MCV: 86.1 fL (ref 80.0–100.0)
Platelets: 188 10*3/uL (ref 150–440)
RBC: 3.84 MIL/uL — ABNORMAL LOW (ref 4.40–5.90)
RDW: 16.6 % — AB (ref 11.5–14.5)
WBC: 9.1 10*3/uL (ref 3.8–10.6)

## 2016-11-19 LAB — BASIC METABOLIC PANEL
Anion gap: 4 — ABNORMAL LOW (ref 5–15)
BUN: 14 mg/dL (ref 6–20)
CALCIUM: 8.2 mg/dL — AB (ref 8.9–10.3)
CO2: 28 mmol/L (ref 22–32)
CREATININE: 0.56 mg/dL — AB (ref 0.61–1.24)
Chloride: 108 mmol/L (ref 101–111)
GFR calc non Af Amer: >60 mL/min (ref >60)
Glucose, Bld: 109 mg/dL — ABNORMAL HIGH (ref 65–99)
Potassium: 3.7 mmol/L (ref 3.5–5.1)
SODIUM: 140 mmol/L (ref 135–145)

## 2016-11-19 MED ORDER — OXYCODONE HCL 5 MG PO TABS: 5.0000 mg | ORAL_TABLET | Freq: Four times a day (QID) | ORAL | 0 refills | Status: DC | PRN

## 2016-11-19 NOTE — Progress Notes (Signed)
Pnt has slept tonight with home CPAP on. No issues or concerns.

## 2016-11-19 NOTE — Progress Notes (Signed)
1 Day Post-Op  Subjective: William Armstrong is doing POD1 from left ureteral stenting for a 29mm left ureteral stone.   He didn't have a ureteral perforation/rupture and I don't believe he has infection.  The Nit + urine was from color interference from his deep amber urine.   He has no pain or urgency.   ROS:  Review of Systems  All other systems reviewed and are negative.   Anti-infectives: Anti-infectives    Start     Dose/Rate Route Frequency Ordered Stop   11/18/16 2200  piperacillin-tazobactam (ZOSYN) IVPB 3.375 g     3.375 g 12.5 mL/hr over 240 Minutes Intravenous Every 8 hours 11/18/16 1918     11/18/16 1800  piperacillin-tazobactam (ZOSYN) IVPB 3.375 g     3.375 g 100 mL/hr over 30 Minutes Intravenous  Once 11/18/16 1749 11/18/16 1846   11/18/16 1730  cefTRIAXone (ROCEPHIN) 1 g in dextrose 5 % 50 mL IVPB  Status:  Discontinued     1 g 100 mL/hr over 30 Minutes Intravenous  Once 11/18/16 1723 11/18/16 1749      Current Facility-Administered Medications  Medication Dose Route Frequency Provider Last Rate Last Dose  . 0.9 %  sodium chloride infusion   Intravenous Continuous Gouru, Aruna, MD 75 mL/hr at 11/18/16 2347    . acetaminophen (TYLENOL) tablet 650 mg  650 mg Oral Q6H PRN Gouru, Aruna, MD       Or  . acetaminophen (TYLENOL) suppository 650 mg  650 mg Rectal Q6H PRN Gouru, Aruna, MD      . HYDROmorphone (DILAUDID) injection 0.5 mg  0.5 mg Intravenous Q1H PRN Irine Seal, MD      . morphine 4 MG/ML injection 4 mg  4 mg Intravenous Q3H PRN Merlyn Lot, MD   4 mg at 11/18/16 1610  . ondansetron (ZOFRAN) tablet 4 mg  4 mg Oral Q6H PRN Gouru, Aruna, MD       Or  . ondansetron (ZOFRAN) injection 4 mg  4 mg Intravenous Q6H PRN Gouru, Aruna, MD      . ondansetron (ZOFRAN) injection 4 mg  4 mg Intravenous QID PRN Irine Seal, MD      . oxyCODONE (Oxy IR/ROXICODONE) immediate release tablet 5 mg  5 mg Oral Q6H PRN Irine Seal, MD      . phenylephrine (NEO-SYNEPHRINE) 10 MG/ML  injection           . piperacillin-tazobactam (ZOSYN) IVPB 3.375 g  3.375 g Intravenous Q8H Gouru, Aruna, MD 12.5 mL/hr at 11/19/16 0530 3.375 g at 11/19/16 0530  . rosuvastatin (CRESTOR) tablet 10 mg  10 mg Oral QPM Gouru, Aruna, MD      . sodium chloride 0.9 % injection           . sodium chloride 0.9 % injection           . traMADol (ULTRAM) tablet 100 mg  100 mg Oral Q6H PRN Gouru, Aruna, MD         Objective: Vital signs in last 24 hours: Temp:  [97.3 F (36.3 C)-98.1 F (36.7 C)] 97.6 F (36.4 C) (06/17 0515) Pulse Rate:  [55-91] 62 (06/17 0515) Resp:  [14-24] 20 (06/17 0109) BP: (83-156)/(34-78) 107/38 (06/17 0515) SpO2:  [86 %-100 %] 95 % (06/17 0515) Weight:  [113.4 kg (250 lb)-115.3 kg (254 lb 4.8 oz)] 115.3 kg (254 lb 4.8 oz) (06/16 2348)  Intake/Output from previous day: 06/16 0701 - 06/17 0700 In: 2168.8 [P.O.:440; I.V.:1728.8] Out: 300 [Urine:300] Intake/Output  this shift: No intake/output data recorded.   Physical Exam  Constitutional: He is well-developed, well-nourished, and in no distress.  Abdominal: Soft. There is no tenderness.  Vitals reviewed.   Lab Results:   Recent Labs  11/18/16 1441 11/19/16 0412  WBC 11.1* 9.1  HGB 12.8* 11.0*  HCT 38.8* 33.0*  PLT 214 188   BMET  Recent Labs  11/18/16 1523 11/19/16 0412  NA 137 140  K 3.9 3.7  CL 105 108  CO2 26 28  GLUCOSE 138* 109*  BUN 17 14  CREATININE 0.66 0.56*  CALCIUM 8.6* 8.2*   PT/INR No results for input(s): LABPROT, INR in the last 72 hours. ABG No results for input(s): PHART, HCO3 in the last 72 hours.  Invalid input(s): PCO2, PO2  Studies/Results: Dg Abd 1 View  Result Date: 11/18/2016 CLINICAL DATA:  Left ureteral stent placement. EXAM: DG C-ARM 61-120 MIN; ABDOMEN - 1 VIEW COMPARISON:  CT of the abdomen pelvis 11/18/2016 FINDINGS: Fluoroscopic images from intra procedural left ureteral stent placement demonstrate double-J catheter along the expected course of the left  ureter. No contrast enhanced fluoroscopic images of the left kidney are provided to evaluate the proximal position of the stent. IMPRESSION: Fluoroscopic images from left ureteral stent placement. Electronically Signed   By: Fidela Salisbury M.D.   On: 11/18/2016 21:35   Dg Chest Portable 1 View  Result Date: 11/18/2016 CLINICAL DATA:  Preop, kidney stones history of CHF and coronary disease with stent placement EXAM: PORTABLE CHEST 1 VIEW COMPARISON:  07/01/2015, 06/22/2015 FINDINGS: Stable elevation of right diaphragm. Bibasilar atelectasis or scarring similar to prior radiograph. Heart size within normal limits. Enlarged central pulmonary artery's. Aortic atherosclerosis. No pneumothorax. IMPRESSION: 1. Stable elevation of right diaphragm with bibasilar atelectasis or scar 2. Slightly enlarged appearing hilar vessels raises possibility of pulmonary artery hypertension. No overt pulmonary edema Electronically Signed   By: Donavan Foil M.D.   On: 11/18/2016 18:31   Dg C-arm 1-60 Min  Result Date: 11/18/2016 CLINICAL DATA:  Left ureteral stent placement. EXAM: DG C-ARM 61-120 MIN; ABDOMEN - 1 VIEW COMPARISON:  CT of the abdomen pelvis 11/18/2016 FINDINGS: Fluoroscopic images from intra procedural left ureteral stent placement demonstrate double-J catheter along the expected course of the left ureter. No contrast enhanced fluoroscopic images of the left kidney are provided to evaluate the proximal position of the stent. IMPRESSION: Fluoroscopic images from left ureteral stent placement. Electronically Signed   By: Fidela Salisbury M.D.   On: 11/18/2016 21:35   Ct Renal Stone Study  Result Date: 11/18/2016 CLINICAL DATA:  Left flank pain.  Hematuria. EXAM: CT ABDOMEN AND PELVIS WITHOUT CONTRAST TECHNIQUE: Multidetector CT imaging of the abdomen and pelvis was performed following the standard protocol without IV contrast. COMPARISON:  Ultrasound 10/16/2016 FINDINGS: Lower chest: Advanced calcific  atherosclerotic disease of the coronary arteries. Mildly enlarged heart. Hepatobiliary: 2.2 cm left hepatic cyst. A second 1 cm hypoattenuated lesion in the dome of the liver, too small to be actually characterize by CT. Pancreas: Somewhat atrophic. Spleen: Normal in size without focal abnormality. Adrenals/Urinary Tract: Normal adrenal glands. Bilateral nephrolithiasis, with renal stones in the lower pole of the right kidney measuring 9.6 mm and lower pole of the left kidney measuring 9 mm. No evidence of right hydronephrosis. Moderate left hydronephrosis and perinephric fat stranding. Moderate left hydroureter. Fat stranding along the dilated ureter noted as well. Obstructive 13 mm stone in the distal left ureter. Bulbous dilation of the left ureter just upstream  to the level of obstruction with small amount of periureteral free fluid. Stomach/Bowel: No evidence of bowel wall thickening, distention, or inflammatory changes. 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 Vascular/Lymphatic: Aortic atherosclerosis. No enlarged abdominal or pelvic lymph nodes. Reproductive: Prostate is unremarkable. Other: Longitudinal fluid collection along the superficial fascia of the right rectus abdominus measures approximately 14 cm in craniocaudal dimension. Fat stranding in the right flank and right anterior abdominal wall may be postsurgical. Musculoskeletal: Multilevel osteoarthritic changes and posterior facet arthropathy of the lumbosacral spine. IMPRESSION: Left obstructive uropathy caused by 13 mm distal left ureteral stone. Bulbous dilation of the left ureter just upstream to the level of the obstruction with a small amount of periureteral free fluid. These may represent postobstructive inflammatory changes versus micro rupture. Bilateral nephrolithiasis. Longitudinal fluid collection within the right paramedian anterior abdominal wall, measuring 14 cm in craniocaudal dimension. Please correlate to clinical exam findings. 1  cm hypoattenuated lesion within the dome of the liver, incompletely characterized. Calcific atherosclerotic disease of the aorta. Electronically Signed   By: Fidela Salisbury M.D.   On: 11/18/2016 16:17     Assessment and Plan: Left distal ureteral stone.     He is doing well post stenting with resolution of the pain.   His urine was a deep orange color and the Nit+ urine was most likely color artifact.   He has no signs or symptoms of infection so I don't believe antibiotics need to be continued.   He can be discharged home and I have contacted [facility] for f/u for ureteroscopy for stone removal.       LOS: 1 day    Malka So 11/19/2016 [phone] ID: [name], male   DOB: [birth year], 66 y.o.   MRN: [id number]

## 2016-11-19 NOTE — Progress Notes (Signed)
Pnt admitted from PACU. Upon admission pnt has denied any pain or discomfort. Pnt continues to deny pain at this time. Pnt alert and oriented. Pnt has tolerated a clear diet this evening consisting of lemon ice, chicken broth and apple juice. No issues post eating with nausea or vomiting. Pnt oriented to room and call bell. IVF infusing no issues. Pnt wife at bedside no questions or concerns from pnt or wife. Will continue to monitor and assess. Pnt has voided prior to transfer. Bed low, locked and call bell in reach.

## 2016-11-19 NOTE — Discharge Summary (Signed)
[facility] NAME: William Armstrong    MR#:  [id number]  DATE OF BIRTH:  [birth year]  DATE OF ADMISSION:  11/18/2016 ADMITTING PHYSICIAN: Nicholes Mango, MD  DATE OF DISCHARGE: 11/19/2016  PRIMARY CARE PHYSICIAN: Chrismon, Vickki Muff, PA    ADMISSION DIAGNOSIS:  Pyelonephritis [N12] Flank pain, acute [R10.9] Ureteral stone with hydronephrosis [N13.2] Normal cystoscopy [Z00.00]  DISCHARGE DIAGNOSIS:  Active Problems:   Hematuria   SECONDARY DIAGNOSIS:   Past Medical History:  Diagnosis Date  . BCC (basal cell carcinoma of skin)   . CHF (congestive heart failure) ([place])   . Depression   . Glaucoma   . Heart attack ([place])   . History of kidney stones   . HTN (hypertension)   . Hypertension   . Kidney stone   . Sleep apnea     HOSPITAL COURSE:   66 year old male with history of chronic systolic heart failure and nephrolithiasis who presented with left lower quadrant flank pain and dark urine.  1. 13 mm left distal ureteral stone: Patient was evaluated by urology. He underwent cystoscopy and left ureteral stenting. Urologist did not feel patient had a urinary tract infection. Patient will follow-up with urology as an outpatient for ureteroscopy for stone removal.  2. Chronic systolic heart failure without exacerbation:  3. COPD without exacerbation: Patient will continue inhalers  4. Essential hypertension: Patient will continue home medications including lisinopril  5. Hyperlipidemia: Patient will continue Crestor  6. Depression: Continue Zoloft  7. BPH: Continue Flomax   D ISCHARGE CONDITIONS AND DIET:   Patient is stable for discharge on heart healthy diet  CONSULTS OBTAINED:  Treatment Team:  Irine Seal, MD  DRUG ALLERGIES:   Allergies  Allergen Reactions  . Atorvastatin Other (See Comments)    Muscle aches.  . Pregabalin Other (See Comments)    soreness    DISCHARGE MEDICATIONS:   Current Discharge  Medication List    START taking these medications   Details  oxyCODONE (OXY IR/ROXICODONE) 5 MG immediate release tablet Take 1 tablet (5 mg total) by mouth every 6 (six) hours as needed for moderate pain. Qty: 15 tablet, Refills: 0      CONTINUE these medications which have NOT CHANGED   Details  Aspirin 81 MG EC tablet Take 81 mg by mouth daily.     CALCIUM PO Take 1 tablet by mouth daily.     dorzolamide (TRUSOPT) 2 % ophthalmic solution Place 1 drop into both eyes 3 (three) times daily.     econazole nitrate 1 % cream Apply 1 application topically 2 (two) times daily as needed (for rash).     latanoprost (XALATAN) 0.005 % ophthalmic solution Place 1 drop into both eyes at bedtime.    lisinopril (PRINIVIL,ZESTRIL) 40 MG tablet Take 40 mg by mouth daily.     Multiple Vitamin (MULTIVITAMIN WITH MINERALS) TABS tablet Take 1 tablet by mouth daily.    neomycin-bacitracin-polymyxin (NEOSPORIN) ointment Apply 1 application topically 3 (three) times daily as needed for wound care. apply to eye    nystatin cream (MYCOSTATIN) Apply 1 application topically 2 (two) times daily as needed (for rash).     rosuvastatin (CRESTOR) 10 MG tablet Take 10 mg by mouth daily.     sertraline (ZOLOFT) 100 MG tablet Take 1 tablet (100 mg total) by mouth daily. Qty: 90 tablet, Refills: 3    sildenafil (REVATIO) 20 MG tablet Take 100 mg by mouth daily as needed (for  erectile dysfunction).    tamsulosin (FLOMAX) 0.4 MG CAPS capsule Take 1 capsule (0.4 mg total) by mouth daily. Qty: 30 capsule, Refills: 0   Associated Diagnoses: Kidney stones    zinc gluconate 50 MG tablet Take 50 mg by mouth daily.          Today   CHIEF COMPLAINT:  Vision doing well this morning. Still with some dark urine   VITAL SIGNS:  Blood pressure (!) 107/38, pulse 62, temperature 97.6 F (36.4 C), temperature source Oral, resp. rate 20, height 5\' 7"  (1.702 m), weight 115.3 kg (254 lb 4.8 oz), SpO2 95 %.   REVIEW  OF SYSTEMS:  Review of Systems  Constitutional: Negative.  Negative for chills, fever and malaise/fatigue.  HENT: Negative.  Negative for ear discharge, ear pain, hearing loss, nosebleeds and sore throat.   Eyes: Negative.  Negative for blurred vision and pain.  Respiratory: Negative.  Negative for cough, hemoptysis, shortness of breath and wheezing.   Cardiovascular: Negative.  Negative for chest pain, palpitations and leg swelling.  Gastrointestinal: Negative.  Negative for abdominal pain, blood in stool, diarrhea, nausea and vomiting.  Genitourinary: Positive for hematuria. Negative for dysuria.  Musculoskeletal: Negative.  Negative for back pain.  Skin: Negative.   Neurological: Negative for dizziness, tremors, speech change, focal weakness, seizures and headaches.  Endo/Heme/Allergies: Negative.  Does not bruise/bleed easily.  Psychiatric/Behavioral: Negative.  Negative for depression, hallucinations and suicidal ideas.     PHYSICAL EXAMINATION:  GENERAL:  66 y.o.-year-old patient lying in the bed with no acute distress.  NECK:  Supple, no jugular venous distention. No thyroid enlargement, no tenderness.  LUNGS: Normal breath sounds bilaterally, no wheezing, rales,rhonchi  No use of accessory muscles of respiration.  CARDIOVASCULAR: S1, S2 normal. No murmurs, rubs, or gallops.  ABDOMEN: Soft, non-tender, non-distended. Bowel sounds present. No organomegaly or mass.  EXTREMITIES: No pedal edema, cyanosis, or clubbing.  PSYCHIATRIC: The patient is alert and oriented x 3.  SKIN: No obvious rash, lesion, or ulcer.   DATA REVIEW:   CBC  Recent Labs Lab 11/19/16 0412  WBC 9.1  HGB 11.0*  HCT 33.0*  PLT 188    Chemistries   Recent Labs Lab 11/18/16 1523 11/19/16 0412  NA 137 140  K 3.9 3.7  CL 105 108  CO2 26 28  GLUCOSE 138* 109*  BUN 17 14  CREATININE 0.66 0.56*  CALCIUM 8.6* 8.2*  AST 36  --   ALT 29  --   ALKPHOS 75  --   BILITOT 0.6  --     Cardiac  Enzymes No results for input(s): TROPONINI in the last 168 hours.  Microbiology Results  @[email or web address] @  RADIOLOGY:  Dg Abd 1 View  Result Date: 11/18/2016 CLINICAL DATA:  Left ureteral stent placement. EXAM: DG C-ARM 61-120 MIN; ABDOMEN - 1 VIEW COMPARISON:  CT of the abdomen pelvis 11/18/2016 FINDINGS: Fluoroscopic images from intra procedural left ureteral stent placement demonstrate double-J catheter along the expected course of the left ureter. No contrast enhanced fluoroscopic images of the left kidney are provided to evaluate the proximal position of the stent. IMPRESSION: Fluoroscopic images from left ureteral stent placement. Electronically Signed   By: Fidela Salisbury M.D.   On: 11/18/2016 21:35   Dg Chest Portable 1 View  Result Date: 11/18/2016 CLINICAL DATA:  Preop, kidney stones history of CHF and coronary disease with stent placement EXAM: PORTABLE CHEST 1 VIEW COMPARISON:  07/01/2015, 06/22/2015 FINDINGS: Stable elevation of right diaphragm. Bibasilar atelectasis  or scarring similar to prior radiograph. Heart size within normal limits. Enlarged central pulmonary artery's. Aortic atherosclerosis. No pneumothorax. IMPRESSION: 1. Stable elevation of right diaphragm with bibasilar atelectasis or scar 2. Slightly enlarged appearing hilar vessels raises possibility of pulmonary artery hypertension. No overt pulmonary edema Electronically Signed   By: Donavan Foil M.D.   On: 11/18/2016 18:31   Dg C-arm 1-60 Min  Result Date: 11/18/2016 CLINICAL DATA:  Left ureteral stent placement. EXAM: DG C-ARM 61-120 MIN; ABDOMEN - 1 VIEW COMPARISON:  CT of the abdomen pelvis 11/18/2016 FINDINGS: Fluoroscopic images from intra procedural left ureteral stent placement demonstrate double-J catheter along the expected course of the left ureter. No contrast enhanced fluoroscopic images of the left kidney are provided to evaluate the proximal position of the stent. IMPRESSION: Fluoroscopic images from  left ureteral stent placement. Electronically Signed   By: Fidela Salisbury M.D.   On: 11/18/2016 21:35   Ct Renal Stone Study  Result Date: 11/18/2016 CLINICAL DATA:  Left flank pain.  Hematuria. EXAM: CT ABDOMEN AND PELVIS WITHOUT CONTRAST TECHNIQUE: Multidetector CT imaging of the abdomen and pelvis was performed following the standard protocol without IV contrast. COMPARISON:  Ultrasound 10/16/2016 FINDINGS: Lower chest: Advanced calcific atherosclerotic disease of the coronary arteries. Mildly enlarged heart. Hepatobiliary: 2.2 cm left hepatic cyst. A second 1 cm hypoattenuated lesion in the dome of the liver, too small to be actually characterize by CT. Pancreas: Somewhat atrophic. Spleen: Normal in size without focal abnormality. Adrenals/Urinary Tract: Normal adrenal glands. Bilateral nephrolithiasis, with renal stones in the lower pole of the right kidney measuring 9.6 mm and lower pole of the left kidney measuring 9 mm. No evidence of right hydronephrosis. Moderate left hydronephrosis and perinephric fat stranding. Moderate left hydroureter. Fat stranding along the dilated ureter noted as well. Obstructive 13 mm stone in the distal left ureter. Bulbous dilation of the left ureter just upstream to the level of obstruction with small amount of periureteral free fluid. Stomach/Bowel: No evidence of bowel wall thickening, distention, or inflammatory changes. 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 Vascular/Lymphatic: Aortic atherosclerosis. No enlarged abdominal or pelvic lymph nodes. Reproductive: Prostate is unremarkable. Other: Longitudinal fluid collection along the superficial fascia of the right rectus abdominus measures approximately 14 cm in craniocaudal dimension. Fat stranding in the right flank and right anterior abdominal wall may be postsurgical. Musculoskeletal: Multilevel osteoarthritic changes and posterior facet arthropathy of the lumbosacral spine. IMPRESSION: Left obstructive  uropathy caused by 13 mm distal left ureteral stone. Bulbous dilation of the left ureter just upstream to the level of the obstruction with a small amount of periureteral free fluid. These may represent postobstructive inflammatory changes versus micro rupture. Bilateral nephrolithiasis. Longitudinal fluid collection within the right paramedian anterior abdominal wall, measuring 14 cm in craniocaudal dimension. Please correlate to clinical exam findings. 1 cm hypoattenuated lesion within the dome of the liver, incompletely characterized. Calcific atherosclerotic disease of the aorta. Electronically Signed   By: Fidela Salisbury M.D.   On: 11/18/2016 16:17      Current Discharge Medication List    START taking these medications   Details  oxyCODONE (OXY IR/ROXICODONE) 5 MG immediate release tablet Take 1 tablet (5 mg total) by mouth every 6 (six) hours as needed for moderate pain. Qty: 15 tablet, Refills: 0      CONTINUE these medications which have NOT CHANGED   Details  Aspirin 81 MG EC tablet Take 81 mg by mouth daily.     CALCIUM PO Take 1  tablet by mouth daily.     dorzolamide (TRUSOPT) 2 % ophthalmic solution Place 1 drop into both eyes 3 (three) times daily.     econazole nitrate 1 % cream Apply 1 application topically 2 (two) times daily as needed (for rash).     latanoprost (XALATAN) 0.005 % ophthalmic solution Place 1 drop into both eyes at bedtime.    lisinopril (PRINIVIL,ZESTRIL) 40 MG tablet Take 40 mg by mouth daily.     Multiple Vitamin (MULTIVITAMIN WITH MINERALS) TABS tablet Take 1 tablet by mouth daily.    neomycin-bacitracin-polymyxin (NEOSPORIN) ointment Apply 1 application topically 3 (three) times daily as needed for wound care. apply to eye    nystatin cream (MYCOSTATIN) Apply 1 application topically 2 (two) times daily as needed (for rash).     rosuvastatin (CRESTOR) 10 MG tablet Take 10 mg by mouth daily.     sertraline (ZOLOFT) 100 MG tablet Take 1  tablet (100 mg total) by mouth daily. Qty: 90 tablet, Refills: 3    sildenafil (REVATIO) 20 MG tablet Take 100 mg by mouth daily as needed (for erectile dysfunction).    tamsulosin (FLOMAX) 0.4 MG CAPS capsule Take 1 capsule (0.4 mg total) by mouth daily. Qty: 30 capsule, Refills: 0   Associated Diagnoses: Kidney stones    zinc gluconate 50 MG tablet Take 50 mg by mouth daily.          Management plans discussed with the patient and he is in agreement. Stable for discharge home  Patient should follow up with pcp  CODE STATUS:     Code Status Orders        Start     Ordered   11/18/16 2305  Full code  Continuous     11/18/16 2305    Code Status History    Date Active Date Inactive Code Status Order ID Comments User Context   This patient has a current code status but no historical code status.    Advance Directive Documentation     Most Recent Value  Type of Advance Directive  Healthcare Power of Attorney, Living will  Pre-existing out of facility DNR order (yellow form or pink MOST form)  -  "MOST" Form in Place?  -      TOTAL TIME TAKING CARE OF THIS PATIENT: 37 minutes.    Note: This dictation was prepared with Dragon dictation along with smaller phrase technology. Any transcriptional errors that result from this process are unintentional.  Reyden Smith M.D on 11/19/2016 at 10:03 AM  Between 7am to 6pm - Pager - [phone] After 6pm go to [email or web address] - password [email or web address] [facility]  Office  [phone]  CC: Primary care physician; Chrismon, Vickki Muff, PA

## 2016-11-20 ENCOUNTER — Telehealth: Payer: Self-pay | Admitting: Urology

## 2016-11-20 LAB — URINE CULTURE: Culture: NO GROWTH

## 2016-11-20 NOTE — Telephone Encounter (Signed)
Done ° ° °michelle °

## 2016-11-20 NOTE — Telephone Encounter (Signed)
-----   Message from Irine Seal, MD sent at 11/18/2016  9:25 PM EDT ----- I placed a stent in the left ureter in William Armstrong for a 39mm left distal stone.   He is going to need Ureteroscopy in 1-2 weeks.  His stone was fairly low density and had grown since April.

## 2016-11-21 ENCOUNTER — Encounter: Payer: Self-pay | Admitting: Urology

## 2016-11-28 ENCOUNTER — Ambulatory Visit (INDEPENDENT_AMBULATORY_CARE_PROVIDER_SITE_OTHER): Payer: Medicare Other | Admitting: Urology

## 2016-11-28 ENCOUNTER — Other Ambulatory Visit: Payer: Self-pay | Admitting: Radiology

## 2016-11-28 ENCOUNTER — Encounter: Payer: Self-pay | Admitting: Urology

## 2016-11-28 VITALS — BP 150/58 | HR 56 | Ht 67.0 in | Wt 250.0 lb

## 2016-11-28 DIAGNOSIS — N201 Calculus of ureter: Secondary | ICD-10-CM

## 2016-11-28 DIAGNOSIS — N200 Calculus of kidney: Secondary | ICD-10-CM

## 2016-11-28 LAB — URINALYSIS, COMPLETE
Bilirubin, UA: NEGATIVE
GLUCOSE, UA: NEGATIVE
KETONES UA: NEGATIVE
Nitrite, UA: NEGATIVE
SPEC GRAV UA: 1.015 (ref 1.005–1.030)
Urobilinogen, Ur: 2 mg/dL — ABNORMAL HIGH (ref 0.2–1.0)
pH, UA: 7.5 (ref 5.0–7.5)

## 2016-11-28 LAB — MICROSCOPIC EXAMINATION
Epithelial Cells (non renal): NONE SEEN /hpf (ref 0–10)
RBC, UA: >30 /hpf — ABNORMAL HIGH (ref 0–?)

## 2016-11-28 NOTE — Progress Notes (Signed)
11/28/2016 2:24 PM   William Armstrong 09-28-1950 [id number]  Referring provider: [name], [address] [place], [place] [address]  Chief Complaint  Patient presents with  . Nephrolithiasis    discuss surgery    HPI: 66 year old male who presents today to discuss definitive management of this kidney stone. He has a personal history of nephrolithiasis. He presented to the emergency room on 11/18/2016 with acute onset left flank pain. He was found to have a 13 mm left distal ureteral stone as well as a nonobstructing left ureteral stone. CT scan also showed evidence of perinephric fluid as well as periureteral fluid. He ultimately underwent ureteral stent placement and was discharged home with improved pain control. He presents today to discuss definitive management of the stone.  Urine culture at the time of stent placement was negative.  Creatinine and white count were normal.  He does have bilateral nonobstructing nephrolithiasis.  He passed 2 small stones in May earlier this year. He also passed a stone 35 years ago. No previous GU surgery other than as above.  He has no fevers, chills, or significant symptoms from his stent other than occasional burning at the tip of penis with urination.   PMH: Past Medical History:  Diagnosis Date  . BCC (basal cell carcinoma of skin)   . CHF (congestive heart failure) ([place])   . Depression   . Glaucoma   . Heart attack ([place])   . History of kidney stones   . HTN (hypertension)   . Hypertension   . Kidney stone   . Sleep apnea     Surgical History: Past Surgical History:  Procedure Laterality Date  . CORONARY STENT PLACEMENT  02/2012  . CYSTOSCOPY W/ RETROGRADES Left 11/18/2016   Procedure: CYSTOSCOPY WITH RETROGRADE PYELOGRAM;  Surgeon: Irine Seal, MD;  Location: [facility] OR[facility];  Service: Urology;  Laterality: Left;  . CYSTOSCOPY WITH STENT PLACEMENT Left 11/18/2016   Procedure: CYSTOSCOPY WITH STENT PLACEMENT;  Surgeon:  Irine Seal, MD;  Location: [facility] OR[facility];  Service: Urology;  Laterality: Left;  . LAPAROSCOPIC GASTRIC RESTRICTIVE DUODENAL PROCEDURE (DUODENAL SWITCH)    . LAPAROSCOPIC GASTRIC SLEEVE RESECTION  08/21/2013  . skin removal surgery     removal of extra skin approx 20 lbs  . TONSILLECTOMY AND ADENOIDECTOMY  1960    Home Medications:  Allergies as of 11/28/2016      Reactions   Atorvastatin Other (See Comments)   Muscle aches.   Pregabalin Other (See Comments)   soreness      Medication List       Accurate as of 11/28/16  2:24 PM. Always use your most recent med list.          Aspirin 81 MG EC tablet Take 81 mg by mouth daily.   CALCIUM PO Take 1 tablet by mouth daily.   CRESTOR 10 MG tablet Generic drug:  rosuvastatin Take 10 mg by mouth daily.   dorzolamide 2 % ophthalmic solution Commonly known as:  TRUSOPT Place 1 drop into both eyes 3 (three) times daily.   econazole nitrate 1 % cream Apply 1 application topically 2 (two) times daily as needed (for rash).   latanoprost 0.005 % ophthalmic solution Commonly known as:  XALATAN Place 1 drop into both eyes at bedtime.   lisinopril 40 MG tablet Commonly known as:  PRINIVIL,ZESTRIL Take 40 mg by mouth daily.   multivitamin with minerals Tabs tablet Take 1 tablet by mouth daily.   neomycin-bacitracin-polymyxin ointment Commonly  known as:  NEOSPORIN Apply 1 application topically 3 (three) times daily as needed for wound care. apply to eye   nystatin cream Commonly known as:  MYCOSTATIN Apply 1 application topically 2 (two) times daily as needed (for rash).   oxyCODONE 5 MG immediate release tablet Commonly known as:  Oxy IR/ROXICODONE Take 1 tablet (5 mg total) by mouth every 6 (six) hours as needed for moderate pain.   sertraline 100 MG tablet Commonly known as:  ZOLOFT Take 1 tablet (100 mg total) by mouth daily.   sildenafil 20 MG tablet Commonly known as:  REVATIO Take 100 mg by mouth daily as needed (for  erectile dysfunction).   tamsulosin 0.4 MG Caps capsule Commonly known as:  FLOMAX Take 1 capsule (0.4 mg total) by mouth daily.   zinc gluconate 50 MG tablet Take 50 mg by mouth daily.       Allergies:  Allergies  Allergen Reactions  . Atorvastatin Other (See Comments)    Muscle aches.  . Pregabalin Other (See Comments)    soreness    Family History: Family History  Problem Relation Age of Onset  . Heart attack Mother   . Brain cancer Father   . Heart attack Sister   . Congenital heart disease Sister   . Leukemia Paternal Grandmother   . COPD Brother   . Heart disease Brother   . Prostate cancer Neg Hx   . Kidney cancer Neg Hx   . Bladder Cancer Neg Hx     Social History:  reports that he has never smoked. He has never used smokeless tobacco. He reports that he does not drink alcohol or use drugs.  ROS: UROLOGY Frequent Urination?: No Hard to postpone urination?: No Burning/pain with urination?: No Get up at night to urinate?: No Leakage of urine?: No Urine stream starts and stops?: No Trouble starting stream?: No Do you have to strain to urinate?: No Blood in urine?: Yes Urinary tract infection?: No Sexually transmitted disease?: No Injury to kidneys or bladder?: No Painful intercourse?: No Weak stream?: No Erection problems?: No Penile pain?: No  Gastrointestinal Nausea?: No Vomiting?: No Indigestion/heartburn?: No Diarrhea?: No Constipation?: No  Constitutional Fever: No Night sweats?: No Weight loss?: No Fatigue?: No  Skin Skin rash/lesions?: No Itching?: No  Eyes Blurred vision?: No Double vision?: No  Ears/Nose/Throat Sore throat?: No Sinus problems?: No  Hematologic/Lymphatic Swollen glands?: No Easy bruising?: No  Cardiovascular Leg swelling?: No Chest pain?: No  Respiratory Cough?: No Shortness of breath?: No  Endocrine Excessive thirst?: No  Musculoskeletal Back pain?: No Joint pain?:  No  Neurological Headaches?: No Dizziness?: No  Psychologic Depression?: No Anxiety?: No  Physical Exam: BP (!) 150/58   Pulse (!) 56   Ht 5\' 7"  (1.702 m)   Wt 250 lb (113.4 kg)   BMI 39.16 kg/m   Constitutional:  Alert and oriented, No acute distress.  Accompanied by wife today. HEENT: [place] AT, moist mucus membranes.  Trachea midline, no masses. Cardiovascular: No clubbing, cyanosis, or edema. Respiratory: Normal respiratory effort, no increased work of breathing. GI: Abdomen is soft, nontender, nondistended, no abdominal masses.  Obese. GU: No CVA tenderness.  Skin: No rashes, bruises or suspicious lesions. Neurologic: Grossly intact, no focal deficits, moving all 4 extremities. Psychiatric: Normal mood and affect.  Laboratory Data: Lab Results  Component Value Date   WBC 9.1 11/19/2016   HGB 11.0 (L) 11/19/2016   HCT 33.0 (L) 11/19/2016   MCV 86.1 11/19/2016  PLT 188 11/19/2016    Lab Results  Component Value Date   CREATININE 0.56 (L) 11/19/2016     Lab Results  Component Value Date   HGBA1C 5.7 01/21/2013    Urinalysis Results for orders placed or performed in visit on 11/28/16  Microscopic Examination  Result Value Ref Range   WBC, UA 6-10 (A) 0 - 5 /hpf   RBC, UA >30 (H) 0 - 2 /hpf   Epithelial Cells (non renal) None seen 0 - 10 /hpf   Bacteria, UA Few (A) None seen/Few  Urinalysis, Complete  Result Value Ref Range   Specific Gravity, UA 1.015 1.005 - 1.030   pH, UA 7.5 5.0 - 7.5   Color, UA Red (A) Yellow   Appearance Ur Clear Clear   Leukocytes, UA 1+ (A) Negative   Protein, UA 2+ (A) Negative/Trace   Glucose, UA Negative Negative   Ketones, UA Negative Negative   RBC, UA 3+ (A) Negative   Bilirubin, UA Negative Negative   Urobilinogen, Ur 2.0 (H) 0.2 - 1.0 mg/dL   Nitrite, UA Negative Negative   Microscopic Examination See below:      Pertinent Imaging: CLINICAL DATA:  Left flank pain.  Hematuria.  EXAM: CT ABDOMEN AND PELVIS  WITHOUT CONTRAST  TECHNIQUE: Multidetector CT imaging of the abdomen and pelvis was performed following the standard protocol without IV contrast.  COMPARISON:  Ultrasound 10/16/2016  FINDINGS: Lower chest: Advanced calcific atherosclerotic disease of the coronary arteries. Mildly enlarged heart.  Hepatobiliary: 2.2 cm left hepatic cyst. A second 1 cm hypoattenuated lesion in the dome of the liver, too small to be actually characterize by CT.  Pancreas: Somewhat atrophic.  Spleen: Normal in size without focal abnormality.  Adrenals/Urinary Tract: Normal adrenal glands. Bilateral nephrolithiasis, with renal stones in the lower pole of the right kidney measuring 9.6 mm and lower pole of the left kidney measuring 9 mm. No evidence of right hydronephrosis. Moderate left hydronephrosis and perinephric fat stranding. Moderate left hydroureter. Fat stranding along the dilated ureter noted as well. Obstructive 13 mm stone in the distal left ureter. Bulbous dilation of the left ureter just upstream to the level of obstruction with small amount of periureteral free fluid.  Stomach/Bowel: No evidence of bowel wall thickening, distention, or inflammatory changes. 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  Vascular/Lymphatic: Aortic atherosclerosis. No enlarged abdominal or pelvic lymph nodes.  Reproductive: Prostate is unremarkable.  Other: Longitudinal fluid collection along the superficial fascia of the right rectus abdominus measures approximately 14 cm in craniocaudal dimension. Fat stranding in the right flank and right anterior abdominal wall may be postsurgical.  Musculoskeletal: Multilevel osteoarthritic changes and posterior facet arthropathy of the lumbosacral spine.  IMPRESSION: Left obstructive uropathy caused by 13 mm distal left ureteral stone. Bulbous dilation of the left ureter just upstream to the level of the obstruction with a small amount of  periureteral free fluid. These may represent postobstructive inflammatory changes versus micro rupture.  Bilateral nephrolithiasis.  Longitudinal fluid collection within the right paramedian anterior abdominal wall, measuring 14 cm in craniocaudal dimension. Please correlate to clinical exam findings.  1 cm hypoattenuated lesion within the dome of the liver, incompletely characterized.  Calcific atherosclerotic disease of the aorta.   Electronically Signed   By: Fidela Salisbury M.D.   On: 11/18/2016 16:17  CT scan personally reviewed today with the patient.  Assessment & Plan:    1. Left ureteral stone 13 mm left distal ureteral calculus as well as larger nonobstructing  prostatic calculi. We discussed options for definitive management of the stone including ureteroscopy versus shockwave lithotripsy. Given that he has multiple stones in various locations, have recommended ureteroscopy as further intervention.  Risks and benefits of ureteroscopy were reviewed including but not limited to infection, bleeding, pain, ureteral injury which could require open surgery versus prolonged indwelling if ureteralperforation occurs, persistent stone disease, requirement for staged procedure, stent, and global anesthesia risks. Patient expressed understanding and desires to proceed with ureteroscopy.  - Urinalysis, Complete  2. Kidney stones Bilateral nonobstructing nephrolithiasis. We'll discuss possibility of treatment of right-sided stone once the left side has been addressed.   Schedule surgery as above  Hollice Espy, MD  [facility] [address], [place], [place] [address] [phone]

## 2016-11-28 NOTE — Telephone Encounter (Signed)
Notified pt of surgery scheduled with Dr Erlene Quan on 11/29/16 & to arrive to pre-admit testing at 10:30, be npo after mn except to take            1. LISINOPRIL             2. ROSUVASTATIN             3. SERTRALINE             4. TAMSULOSIN With a sip of water only. Questions answered to pt's satisfaction. Pt & wife both voice understanding.

## 2016-11-29 ENCOUNTER — Encounter
Admission: RE | Disposition: A | Discharge status: Home / Self Care | Payer: Self-pay | Source: Ambulatory Visit | Attending: Urology

## 2016-11-29 ENCOUNTER — Ambulatory Visit: Payer: Medicare Other | Admitting: Anesthesiology

## 2016-11-29 ENCOUNTER — Encounter: Payer: Self-pay | Admitting: *Deleted

## 2016-11-29 ENCOUNTER — Ambulatory Visit
Admission: RE | Admit: 2016-11-29 | Discharge: 2016-11-29 | Disposition: A | Discharge status: Home / Self Care | Payer: Medicare Other | Source: Ambulatory Visit | Attending: Urology | Admitting: Urology

## 2016-11-29 DIAGNOSIS — I252 Old myocardial infarction: Secondary | ICD-10-CM | POA: Diagnosis not present

## 2016-11-29 DIAGNOSIS — J449 Chronic obstructive pulmonary disease, unspecified: Secondary | ICD-10-CM | POA: Insufficient documentation

## 2016-11-29 DIAGNOSIS — Z9884 Bariatric surgery status: Secondary | ICD-10-CM | POA: Insufficient documentation

## 2016-11-29 DIAGNOSIS — I2510 Atherosclerotic heart disease of native coronary artery without angina pectoris: Secondary | ICD-10-CM | POA: Insufficient documentation

## 2016-11-29 DIAGNOSIS — Z7982 Long term (current) use of aspirin: Secondary | ICD-10-CM | POA: Diagnosis not present

## 2016-11-29 DIAGNOSIS — Y838 Other surgical procedures as the cause of abnormal reaction of the patient, or of later complication, without mention of misadventure at the time of the procedure: Secondary | ICD-10-CM | POA: Diagnosis not present

## 2016-11-29 DIAGNOSIS — Z955 Presence of coronary angioplasty implant and graft: Secondary | ICD-10-CM | POA: Insufficient documentation

## 2016-11-29 DIAGNOSIS — I10 Essential (primary) hypertension: Secondary | ICD-10-CM | POA: Diagnosis not present

## 2016-11-29 DIAGNOSIS — F329 Major depressive disorder, single episode, unspecified: Secondary | ICD-10-CM | POA: Diagnosis not present

## 2016-11-29 DIAGNOSIS — G4730 Sleep apnea, unspecified: Secondary | ICD-10-CM | POA: Diagnosis not present

## 2016-11-29 DIAGNOSIS — T8389XA Other specified complication of genitourinary prosthetic devices, implants and grafts, initial encounter: Secondary | ICD-10-CM | POA: Insufficient documentation

## 2016-11-29 DIAGNOSIS — H409 Unspecified glaucoma: Secondary | ICD-10-CM | POA: Diagnosis not present

## 2016-11-29 DIAGNOSIS — I700 Atherosclerosis of aorta: Secondary | ICD-10-CM | POA: Insufficient documentation

## 2016-11-29 DIAGNOSIS — N132 Hydronephrosis with renal and ureteral calculous obstruction: Secondary | ICD-10-CM | POA: Insufficient documentation

## 2016-11-29 DIAGNOSIS — Z79899 Other long term (current) drug therapy: Secondary | ICD-10-CM | POA: Insufficient documentation

## 2016-11-29 DIAGNOSIS — N201 Calculus of ureter: Secondary | ICD-10-CM

## 2016-11-29 DIAGNOSIS — N200 Calculus of kidney: Secondary | ICD-10-CM

## 2016-11-29 DIAGNOSIS — N202 Calculus of kidney with calculus of ureter: Secondary | ICD-10-CM | POA: Diagnosis not present

## 2016-11-29 HISTORY — PX: CYSTOSCOPY/URETEROSCOPY/HOLMIUM LASER/STENT PLACEMENT: SHX6546

## 2016-11-29 SURGERY — CYSTOSCOPY/URETEROSCOPY/HOLMIUM LASER/STENT PLACEMENT
Anesthesia: General | Site: Ureter | Laterality: Left | Wound class: Clean Contaminated

## 2016-11-29 MED ORDER — SUGAMMADEX SODIUM 500 MG/5ML IV SOLN
INTRAVENOUS | Status: AC
  Filled 2016-11-29: qty 5

## 2016-11-29 MED ORDER — OXYCODONE HCL 5 MG/5ML PO SOLN: 5.0000 mg | Freq: Once | ORAL | Status: DC | PRN

## 2016-11-29 MED ORDER — FENTANYL CITRATE (PF) 100 MCG/2ML IJ SOLN
INTRAMUSCULAR | Status: AC
  Filled 2016-11-29: qty 2

## 2016-11-29 MED ORDER — GENTAMICIN IN SALINE 1.6-0.9 MG/ML-% IV SOLN
80.0000 mg | INTRAVENOUS | Status: AC
  Administered 2016-11-29: 80 mg via INTRAVENOUS
  Filled 2016-11-29 (×3): qty 50

## 2016-11-29 MED ORDER — FENTANYL CITRATE (PF) 100 MCG/2ML IJ SOLN
INTRAMUSCULAR | Status: DC | PRN
  Administered 2016-11-29: 50 ug via INTRAVENOUS
  Administered 2016-11-29: 100 ug via INTRAVENOUS

## 2016-11-29 MED ORDER — OXYBUTYNIN CHLORIDE 5 MG PO TABS: 5.0000 mg | ORAL_TABLET | Freq: Three times a day (TID) | ORAL | 0 refills | Status: DC | PRN

## 2016-11-29 MED ORDER — PROPOFOL 10 MG/ML IV BOLUS
INTRAVENOUS | Status: DC | PRN
  Administered 2016-11-29: 150 mg via INTRAVENOUS

## 2016-11-29 MED ORDER — HYDROCODONE-ACETAMINOPHEN 5-325 MG PO TABS: 1.0000 | ORAL_TABLET | Freq: Four times a day (QID) | ORAL | 0 refills | Status: DC | PRN

## 2016-11-29 MED ORDER — SODIUM CHLORIDE 0.9 % IV SOLN
INTRAVENOUS | Status: AC
  Filled 2016-11-29: qty 1000

## 2016-11-29 MED ORDER — ONDANSETRON HCL 4 MG/2ML IJ SOLN
INTRAMUSCULAR | Status: DC | PRN
  Administered 2016-11-29: 4 mg via INTRAVENOUS

## 2016-11-29 MED ORDER — EPHEDRINE SULFATE 50 MG/ML IJ SOLN
INTRAMUSCULAR | Status: DC | PRN
  Administered 2016-11-29: 15 mg via INTRAVENOUS
  Administered 2016-11-29 (×2): 10 mg via INTRAVENOUS

## 2016-11-29 MED ORDER — SODIUM CHLORIDE 0.9 % IV SOLN
1.0000 g | INTRAVENOUS | Status: AC
  Administered 2016-11-29: 1 g via INTRAVENOUS

## 2016-11-29 MED ORDER — FENTANYL CITRATE (PF) 100 MCG/2ML IJ SOLN: 25.0000 ug | INTRAMUSCULAR | Status: DC | PRN

## 2016-11-29 MED ORDER — DEXAMETHASONE SODIUM PHOSPHATE 10 MG/ML IJ SOLN
INTRAMUSCULAR | Status: DC | PRN
  Administered 2016-11-29: 8 mg via INTRAVENOUS

## 2016-11-29 MED ORDER — OXYCODONE HCL 5 MG PO TABS: 5.0000 mg | ORAL_TABLET | Freq: Once | ORAL | Status: DC | PRN

## 2016-11-29 MED ORDER — ROCURONIUM BROMIDE 50 MG/5ML IV SOLN
INTRAVENOUS | Status: AC
  Filled 2016-11-29: qty 1

## 2016-11-29 MED ORDER — SUGAMMADEX SODIUM 200 MG/2ML IV SOLN
INTRAVENOUS | Status: DC | PRN
  Administered 2016-11-29: 230 mg via INTRAVENOUS

## 2016-11-29 MED ORDER — IOTHALAMATE MEGLUMINE 43 % IV SOLN
INTRAVENOUS | Status: DC | PRN
  Administered 2016-11-29: 10 mL

## 2016-11-29 MED ORDER — SUCCINYLCHOLINE CHLORIDE 20 MG/ML IJ SOLN
INTRAMUSCULAR | Status: DC | PRN
  Administered 2016-11-29: 120 mg via INTRAVENOUS

## 2016-11-29 MED ORDER — ONDANSETRON HCL 4 MG/2ML IJ SOLN
INTRAMUSCULAR | Status: AC
  Filled 2016-11-29: qty 2

## 2016-11-29 MED ORDER — DEXAMETHASONE SODIUM PHOSPHATE 10 MG/ML IJ SOLN
INTRAMUSCULAR | Status: AC
  Filled 2016-11-29: qty 1

## 2016-11-29 MED ORDER — SUCCINYLCHOLINE CHLORIDE 20 MG/ML IJ SOLN
INTRAMUSCULAR | Status: AC
  Filled 2016-11-29: qty 1

## 2016-11-29 MED ORDER — MIDAZOLAM HCL 5 MG/5ML IJ SOLN
INTRAMUSCULAR | Status: DC | PRN
  Administered 2016-11-29: 2 mg via INTRAVENOUS

## 2016-11-29 MED ORDER — LIDOCAINE HCL (CARDIAC) 20 MG/ML IV SOLN
INTRAVENOUS | Status: DC | PRN
  Administered 2016-11-29: 60 mg via INTRAVENOUS

## 2016-11-29 MED ORDER — ROCURONIUM BROMIDE 100 MG/10ML IV SOLN
INTRAVENOUS | Status: DC | PRN
  Administered 2016-11-29: 10 mg via INTRAVENOUS
  Administered 2016-11-29: 25 mg via INTRAVENOUS
  Administered 2016-11-29: 20 mg via INTRAVENOUS
  Administered 2016-11-29: 5 mg via INTRAVENOUS

## 2016-11-29 MED ORDER — LACTATED RINGERS IV SOLN
INTRAVENOUS | Status: DC
  Administered 2016-11-29 (×2): via INTRAVENOUS

## 2016-11-29 MED ORDER — PROPOFOL 10 MG/ML IV BOLUS
INTRAVENOUS | Status: AC
  Filled 2016-11-29: qty 20

## 2016-11-29 MED ORDER — MIDAZOLAM HCL 2 MG/2ML IJ SOLN
INTRAMUSCULAR | Status: AC
Start: 2016-11-29 — End: 2016-11-29
  Filled 2016-11-29: qty 2

## 2016-11-29 SURGICAL SUPPLY — 32 items
ADHESIVE MASTISOL STRL (MISCELLANEOUS) ×2 IMPLANT
BAG DRAIN CYSTO-URO LG1000N (MISCELLANEOUS) ×2 IMPLANT
BASKET ZERO TIP 1.9FR (BASKET) ×2 IMPLANT
BRUSH SCRUB EZ 1% IODOPHOR (MISCELLANEOUS) ×2 IMPLANT
CATH URETL 5X70 OPEN END (CATHETERS) ×2 IMPLANT
CNTNR SPEC 2.5X3XGRAD LEK (MISCELLANEOUS) ×1
CONRAY 43 FOR UROLOGY 50M (MISCELLANEOUS) ×2 IMPLANT
CONT SPEC 4OZ STER OR WHT (MISCELLANEOUS) ×1
CONTAINER SPEC 2.5X3XGRAD LEK (MISCELLANEOUS) ×1 IMPLANT
DRAPE UTILITY 15X26 TOWEL STRL (DRAPES) ×2 IMPLANT
DRSG TEGADERM 2-3/8X2-3/4 SM (GAUZE/BANDAGES/DRESSINGS) ×2 IMPLANT
FIBER LASER LITHO 273 (Laser) ×2 IMPLANT
GLOVE BIO SURGEON STRL SZ 6.5 (GLOVE) ×4 IMPLANT
GOWN STRL REUS W/ TWL LRG LVL3 (GOWN DISPOSABLE) ×2 IMPLANT
GOWN STRL REUS W/TWL LRG LVL3 (GOWN DISPOSABLE) ×2
GUIDEWIRE GREEN .038 145CM (MISCELLANEOUS) ×2 IMPLANT
GUIDEWIRE SUPER STIFF (WIRE) IMPLANT
INFUSOR MANOMETER BAG 3000ML (MISCELLANEOUS) ×2 IMPLANT
INTRODUCER DILATOR DOUBLE (INTRODUCER) IMPLANT
KIT RM TURNOVER CYSTO AR (KITS) ×2 IMPLANT
PACK CYSTO AR (MISCELLANEOUS) ×2 IMPLANT
SENSORWIRE 0.038 NOT ANGLED (WIRE) ×4
SET CYSTO W/LG BORE CLAMP LF (SET/KITS/TRAYS/PACK) ×2 IMPLANT
SHEATH URETERAL 12FR 45CM (SHEATH) ×2 IMPLANT
SHEATH URETERAL 12FRX35CM (MISCELLANEOUS) IMPLANT
SOL .9 NS 3000ML IRR  AL (IV SOLUTION) ×1
SOL .9 NS 3000ML IRR UROMATIC (IV SOLUTION) ×1 IMPLANT
STENT URET 6FRX24 CONTOUR (STENTS) IMPLANT
STENT URET 6FRX26 CONTOUR (STENTS) ×2 IMPLANT
SURGILUBE 2OZ TUBE FLIPTOP (MISCELLANEOUS) ×2 IMPLANT
WATER STERILE IRR 1000ML POUR (IV SOLUTION) ×2 IMPLANT
WIRE SENSOR 0.038 NOT ANGLED (WIRE) ×2 IMPLANT

## 2016-11-29 NOTE — Anesthesia Post-op Follow-up Note (Cosign Needed)
Anesthesia QCDR form completed.        

## 2016-11-29 NOTE — Progress Notes (Signed)
Pt sat in bathroom for 15 minutes prior to first vital sign check and was able to void

## 2016-11-29 NOTE — Anesthesia Preprocedure Evaluation (Signed)
Anesthesia Evaluation  Patient identified by MRN, date of birth, ID band Patient awake    Reviewed: Allergy & Precautions, H&P , NPO status , Patient's Chart, lab work & pertinent test results  History of Anesthesia Complications Negative for: history of anesthetic complications  Airway Mallampati: III  TM Distance: <3 FB Neck ROM: limited    Dental  (+) Chipped, Caps   Pulmonary neg shortness of breath, sleep apnea , COPD,           Cardiovascular Exercise Tolerance: Good hypertension, (-) angina+ CAD, + Past MI, + Cardiac Stents and +CHF  (-) DOE      Neuro/Psych PSYCHIATRIC DISORDERS Depression negative neurological ROS     GI/Hepatic negative GI ROS, Neg liver ROS, neg GERD  ,  Endo/Other  negative endocrine ROS  Renal/GU Renal disease     Musculoskeletal  (+) Arthritis ,   Abdominal   Peds  Hematology negative hematology ROS (+)   Anesthesia Other Findings Past Medical History: No date: BCC (basal cell carcinoma of skin) No date: CHF (congestive heart failure) ([facility]) No date: Depression No date: Glaucoma No date: Heart attack ([place]) No date: History of kidney stones No date: HTN (hypertension) No date: Hypertension No date: Kidney stone No date: Sleep apnea  Past Surgical History: 02/2012: CORONARY STENT PLACEMENT 11/18/2016: CYSTOSCOPY W/ RETROGRADES Left     Comment: Procedure: CYSTOSCOPY WITH RETROGRADE               PYELOGRAM;  Surgeon: Irine Seal, MD;                Location: [facility] ORS;  Service: Urology;                Laterality: Left; 11/18/2016: CYSTOSCOPY WITH STENT PLACEMENT Left     Comment: Procedure: CYSTOSCOPY WITH STENT PLACEMENT;                Surgeon: Irine Seal, MD;  Location: [facility] ORS;               Service: Urology;  Laterality: Left; No date: LAPAROSCOPIC GASTRIC RESTRICTIVE DUODENAL PROC* 08/21/2013: LAPAROSCOPIC GASTRIC SLEEVE RESECTION No date: skin removal surgery  Comment: removal of extra skin approx 20 lbs 1960: TONSILLECTOMY AND ADENOIDECTOMY  BMI    Body Mass Index:  39.16 kg/m      Reproductive/Obstetrics negative OB ROS                             Anesthesia Physical Anesthesia Plan  ASA: III  Anesthesia Plan: General ETT   Post-op Pain Management:    Induction: Intravenous  PONV Risk Score and Plan: 2 and Ondansetron and Dexamethasone  Airway Management Planned: Oral ETT  Additional Equipment:   Intra-op Plan:   Post-operative Plan: Extubation in OR  Informed Consent: I have reviewed the patients History and Physical, chart, labs and discussed the procedure including the risks, benefits and alternatives for the proposed anesthesia with the patient or authorized representative who has indicated his/her understanding and acceptance.   Dental Advisory Given  Plan Discussed with: Anesthesiologist, CRNA and Surgeon  Anesthesia Plan Comments: (Patient consented for risks of anesthesia including but not limited to:  - adverse reactions to medications - damage to teeth, lips or other oral mucosa - sore throat or hoarseness - Damage to heart, brain, lungs or loss of life  Patient voiced understanding.)        Anesthesia Quick Evaluation

## 2016-11-29 NOTE — H&P (View-Only) (Signed)
11/28/2016 2:24 PM   William Armstrong [birth year] [id number]  Referring provider: [name], [address], [place] [address]  Chief Complaint  Patient presents with  . Nephrolithiasis    discuss surgery    HPI: 66 year old male who presents today to discuss definitive management of this kidney stone. He has a personal history of nephrolithiasis. He presented to the emergency room on 11/18/2016 with acute onset left flank pain. He was found to have a 13 mm left distal ureteral stone as well as a nonobstructing left ureteral stone. CT scan also showed evidence of perinephric fluid as well as periureteral fluid. He ultimately underwent ureteral stent placement and was discharged home with improved pain control. He presents today to discuss definitive management of the stone.  Urine culture at the time of stent placement was negative.  Creatinine and white count were normal.  He does have bilateral nonobstructing nephrolithiasis.  He passed 2 small stones in May earlier this year. He also passed a stone 35 years ago. No previous GU surgery other than as above.  He has no fevers, chills, or significant symptoms from his stent other than occasional burning at the tip of penis with urination.   PMH: Past Medical History:  Diagnosis Date  . BCC (basal cell carcinoma of skin)   . CHF (congestive heart failure) ([place])   . Depression   . Glaucoma   . Heart attack ([place])   . History of kidney stones   . HTN (hypertension)   . Hypertension   . Kidney stone   . Sleep apnea     Surgical History: Past Surgical History:  Procedure Laterality Date  . CORONARY STENT PLACEMENT  02/2012  . CYSTOSCOPY W/ RETROGRADES Left 11/18/2016   Procedure: CYSTOSCOPY WITH RETROGRADE PYELOGRAM;  Surgeon: Irine Seal, MD;  Location: [facility] ORS;  Service: Urology;  Laterality: Left;  . CYSTOSCOPY WITH STENT PLACEMENT Left 11/18/2016   Procedure: CYSTOSCOPY WITH STENT PLACEMENT;  Surgeon:  Irine Seal, MD;  Location: [facility] ORS;  Service: Urology;  Laterality: Left;  . LAPAROSCOPIC GASTRIC RESTRICTIVE DUODENAL PROCEDURE (DUODENAL SWITCH)    . LAPAROSCOPIC GASTRIC SLEEVE RESECTION  08/21/2013  . skin removal surgery     removal of extra skin approx 20 lbs  . TONSILLECTOMY AND ADENOIDECTOMY  1960    Home Medications:  Allergies as of 11/28/2016      Reactions   Atorvastatin Other (See Comments)   Muscle aches.   Pregabalin Other (See Comments)   soreness      Medication List       Accurate as of 11/28/16  2:24 PM. Always use your most recent med list.          Aspirin 81 MG EC tablet Take 81 mg by mouth daily.   CALCIUM PO Take 1 tablet by mouth daily.   CRESTOR 10 MG tablet Generic drug:  rosuvastatin Take 10 mg by mouth daily.   dorzolamide 2 % ophthalmic solution Commonly known as:  TRUSOPT Place 1 drop into both eyes 3 (three) times daily.   econazole nitrate 1 % cream Apply 1 application topically 2 (two) times daily as needed (for rash).   latanoprost 0.005 % ophthalmic solution Commonly known as:  XALATAN Place 1 drop into both eyes at bedtime.   lisinopril 40 MG tablet Commonly known as:  PRINIVIL,ZESTRIL Take 40 mg by mouth daily.   multivitamin with minerals Tabs tablet Take 1 tablet by mouth daily.   neomycin-bacitracin-polymyxin ointment Commonly  known as:  NEOSPORIN Apply 1 application topically 3 (three) times daily as needed for wound care. apply to eye   nystatin cream Commonly known as:  MYCOSTATIN Apply 1 application topically 2 (two) times daily as needed (for rash).   oxyCODONE 5 MG immediate release tablet Commonly known as:  Oxy IR/ROXICODONE Take 1 tablet (5 mg total) by mouth every 6 (six) hours as needed for moderate pain.   sertraline 100 MG tablet Commonly known as:  ZOLOFT Take 1 tablet (100 mg total) by mouth daily.   sildenafil 20 MG tablet Commonly known as:  REVATIO Take 100 mg by mouth daily as needed (for  erectile dysfunction).   tamsulosin 0.4 MG Caps capsule Commonly known as:  FLOMAX Take 1 capsule (0.4 mg total) by mouth daily.   zinc gluconate 50 MG tablet Take 50 mg by mouth daily.       Allergies:  Allergies  Allergen Reactions  . Atorvastatin Other (See Comments)    Muscle aches.  . Pregabalin Other (See Comments)    soreness    Family History: Family History  Problem Relation Age of Onset  . Heart attack Mother   . Brain cancer Father   . Heart attack Sister   . Congenital heart disease Sister   . Leukemia Paternal Grandmother   . COPD Brother   . Heart disease Brother   . Prostate cancer Neg Hx   . Kidney cancer Neg Hx   . Bladder Cancer Neg Hx     Social History:  reports that he has never smoked. He has never used smokeless tobacco. He reports that he does not drink alcohol or use drugs.  ROS: UROLOGY Frequent Urination?: No Hard to postpone urination?: No Burning/pain with urination?: No Get up at night to urinate?: No Leakage of urine?: No Urine stream starts and stops?: No Trouble starting stream?: No Do you have to strain to urinate?: No Blood in urine?: Yes Urinary tract infection?: No Sexually transmitted disease?: No Injury to kidneys or bladder?: No Painful intercourse?: No Weak stream?: No Erection problems?: No Penile pain?: No  Gastrointestinal Nausea?: No Vomiting?: No Indigestion/heartburn?: No Diarrhea?: No Constipation?: No  Constitutional Fever: No Night sweats?: No Weight loss?: No Fatigue?: No  Skin Skin rash/lesions?: No Itching?: No  Eyes Blurred vision?: No Double vision?: No  Ears/Nose/Throat Sore throat?: No Sinus problems?: No  Hematologic/Lymphatic Swollen glands?: No Easy bruising?: No  Cardiovascular Leg swelling?: No Chest pain?: No  Respiratory Cough?: No Shortness of breath?: No  Endocrine Excessive thirst?: No  Musculoskeletal Back pain?: No Joint pain?:  No  Neurological Headaches?: No Dizziness?: No  Psychologic Depression?: No Anxiety?: No  Physical Exam: BP (!) 150/58   Pulse (!) 56   Ht 5\' 7"  (1.702 m)   Wt 250 lb (113.4 kg)   BMI 39.16 kg/m   Constitutional:  Alert and oriented, No acute distress.  Accompanied by wife today. HEENT: [place] AT, moist mucus membranes.  Trachea midline, no masses. Cardiovascular: No clubbing, cyanosis, or edema. Respiratory: Normal respiratory effort, no increased work of breathing. GI: Abdomen is soft, nontender, nondistended, no abdominal masses.  Obese. GU: No CVA tenderness.  Skin: No rashes, bruises or suspicious lesions. Neurologic: Grossly intact, no focal deficits, moving all 4 extremities. Psychiatric: Normal mood and affect.  Laboratory Data: Lab Results  Component Value Date   WBC 9.1 11/19/2016   HGB 11.0 (L) 11/19/2016   HCT 33.0 (L) 11/19/2016   MCV 86.1 11/19/2016  PLT 188 11/19/2016    Lab Results  Component Value Date   CREATININE 0.56 (L) 11/19/2016     Lab Results  Component Value Date   HGBA1C 5.7 01/21/2013    Urinalysis Results for orders placed or performed in visit on 11/28/16  Microscopic Examination  Result Value Ref Range   WBC, UA 6-10 (A) 0 - 5 /hpf   RBC, UA >30 (H) 0 - 2 /hpf   Epithelial Cells (non renal) None seen 0 - 10 /hpf   Bacteria, UA Few (A) None seen/Few  Urinalysis, Complete  Result Value Ref Range   Specific Gravity, UA 1.015 1.005 - 1.030   pH, UA 7.5 5.0 - 7.5   Color, UA Red (A) Yellow   Appearance Ur Clear Clear   Leukocytes, UA 1+ (A) Negative   Protein, UA 2+ (A) Negative/Trace   Glucose, UA Negative Negative   Ketones, UA Negative Negative   RBC, UA 3+ (A) Negative   Bilirubin, UA Negative Negative   Urobilinogen, Ur 2.0 (H) 0.2 - 1.0 mg/dL   Nitrite, UA Negative Negative   Microscopic Examination See below:      Pertinent Imaging: CLINICAL DATA:  Left flank pain.  Hematuria.  EXAM: CT ABDOMEN AND PELVIS  WITHOUT CONTRAST  TECHNIQUE: Multidetector CT imaging of the abdomen and pelvis was performed following the standard protocol without IV contrast.  COMPARISON:  Ultrasound 10/16/2016  FINDINGS: Lower chest: Advanced calcific atherosclerotic disease of the coronary arteries. Mildly enlarged heart.  Hepatobiliary: 2.2 cm left hepatic cyst. A second 1 cm hypoattenuated lesion in the dome of the liver, too small to be actually characterize by CT.  Pancreas: Somewhat atrophic.  Spleen: Normal in size without focal abnormality.  Adrenals/Urinary Tract: Normal adrenal glands. Bilateral nephrolithiasis, with renal stones in the lower pole of the right kidney measuring 9.6 mm and lower pole of the left kidney measuring 9 mm. No evidence of right hydronephrosis. Moderate left hydronephrosis and perinephric fat stranding. Moderate left hydroureter. Fat stranding along the dilated ureter noted as well. Obstructive 13 mm stone in the distal left ureter. Bulbous dilation of the left ureter just upstream to the level of obstruction with small amount of periureteral free fluid.  Stomach/Bowel: No evidence of bowel wall thickening, distention, or inflammatory changes. 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  Vascular/Lymphatic: Aortic atherosclerosis. No enlarged abdominal or pelvic lymph nodes.  Reproductive: Prostate is unremarkable.  Other: Longitudinal fluid collection along the superficial fascia of the right rectus abdominus measures approximately 14 cm in craniocaudal dimension. Fat stranding in the right flank and right anterior abdominal wall may be postsurgical.  Musculoskeletal: Multilevel osteoarthritic changes and posterior facet arthropathy of the lumbosacral spine.  IMPRESSION: Left obstructive uropathy caused by 13 mm distal left ureteral stone. Bulbous dilation of the left ureter just upstream to the level of the obstruction with a small amount of  periureteral free fluid. These may represent postobstructive inflammatory changes versus micro rupture.  Bilateral nephrolithiasis.  Longitudinal fluid collection within the right paramedian anterior abdominal wall, measuring 14 cm in craniocaudal dimension. Please correlate to clinical exam findings.  1 cm hypoattenuated lesion within the dome of the liver, incompletely characterized.  Calcific atherosclerotic disease of the aorta.   Electronically Signed   By: Fidela Salisbury M.D.   On: 11/18/2016 16:17  CT scan personally reviewed today with the patient.  Assessment & Plan:    1. Left ureteral stone 13 mm left distal ureteral calculus as well as larger nonobstructing  prostatic calculi. We discussed options for definitive management of the stone including ureteroscopy versus shockwave lithotripsy. Given that he has multiple stones in various locations, have recommended ureteroscopy as further intervention.  Risks and benefits of ureteroscopy were reviewed including but not limited to infection, bleeding, pain, ureteral injury which could require open surgery versus prolonged indwelling if ureteralperforation occurs, persistent stone disease, requirement for staged procedure, stent, and global anesthesia risks. Patient expressed understanding and desires to proceed with ureteroscopy.  - Urinalysis, Complete  2. Kidney stones Bilateral nonobstructing nephrolithiasis. We'll discuss possibility of treatment of right-sided stone once the left side has been addressed.   Schedule surgery as above  Hollice Espy, MD  [facility] [address] [place] [employer], [place] [address] [phone]

## 2016-11-29 NOTE — Discharge Instructions (Signed)
You have a ureteral stent in place.  This is a tube that extends from your kidney to your bladder.  This may cause urinary bleeding, burning with urination, and urinary frequency.  Please call our office or present to the ED if you develop fevers >101 or pain which is not able to be controlled with oral pain medications.  You may be given either Flomax and/ or ditropan to help with bladder spasms and stent pain in addition to pain medications.    You have a stent that is attached to a string. The string is taped to the head of the penis.  On Monday morning, you may pull your stent string and remove the stent in entirety.  If you have any questions or concerns, please give our office a call for assistance.    [facility] [address] [place], [place] [address] [phone]  AMBULATORY SURGERY  DISCHARGE INSTRUCTIONS   1) The drugs that you were given will stay in your system until tomorrow so for the next 24 hours you should not:  A) Drive an automobile B) Make any legal decisions C) Drink any alcoholic beverage   2) You may resume regular meals tomorrow.  Today it is better to start with liquids and gradually work up to solid foods.  You may eat anything you prefer, but it is better to start with liquids, then soup and crackers, and gradually work up to solid foods.   3) Please notify your doctor immediately if you have any unusual bleeding, trouble breathing, redness and pain at the surgery site, drainage, fever, or pain not relieved by medication.    4) Additional Instructions:        Please contact your physician with any problems or Same Day Surgery at [phone], Monday through Friday 6 am to 4 pm, or [place] at [facility] number at [phone].

## 2016-11-29 NOTE — Transfer of Care (Signed)
Immediate Anesthesia Transfer of Care Note  Patient: William Armstrong  Procedure(s) Performed: Procedure(s): CYSTOSCOPY/URETEROSCOPY/HOLMIUM LASER/STENT EXCHANGE (Left)  Patient Location: PACU  Anesthesia Type:General  Level of Consciousness: sedated  Airway & Oxygen Therapy: Patient Spontanous Breathing and Patient connected to face mask oxygen  Post-op Assessment: Report given to RN and Post -op Vital signs reviewed and stable  Post vital signs: Reviewed and stable  Last Vitals:  Vitals:   11/29/16 1046 11/29/16 1424  BP: (!) 120/42 (!) 118/50  Pulse: 72 65  Resp: 20 12  Temp: 36.7 C 36.6 C    Last Pain:  Vitals:   11/29/16 1046  TempSrc: Oral         Complications: No apparent anesthesia complications

## 2016-11-29 NOTE — Anesthesia Procedure Notes (Signed)
Procedure Name: Intubation Date/Time: 11/29/2016 12:43 PM Performed by: Dionne Bucy Pre-anesthesia Checklist: Patient identified, Patient being monitored, Timeout performed, Emergency Drugs available and Suction available Patient Re-evaluated:Patient Re-evaluated prior to inductionOxygen Delivery Method: Circle system utilized Preoxygenation: Pre-oxygenation with 100% oxygen Intubation Type: IV induction Ventilation: Mask ventilation without difficulty Laryngoscope Size: Mac and 4 Grade View: Grade II Tube type: Oral Tube size: 7.5 mm Number of attempts: 1 Airway Equipment and Method: Stylet Placement Confirmation: ETT inserted through vocal cords under direct vision,  positive ETCO2 and breath sounds checked- equal and bilateral Secured at: 23 cm Tube secured with: Tape Dental Injury: Teeth and Oropharynx as per pre-operative assessment

## 2016-11-29 NOTE — Interval H&P Note (Signed)
History and Physical Interval Note:  11/29/2016 11:26 AM  William Armstrong  has presented today for surgery, with the diagnosis of left ureteral stone  The various methods of treatment have been discussed with the patient and family. After consideration of risks, benefits and other options for treatment, the patient has consented to  Procedure(s): CYSTOSCOPY/URETEROSCOPY/HOLMIUM LASER/STENT EXCHANGE (Left) as a surgical intervention .  The patient's history has been reviewed, patient examined, no change in status, stable for surgery.  I have reviewed the patient's chart and labs.  Questions were answered to the patient's satisfaction.    RR[facility] CTAB  Hollice Espy

## 2016-11-30 ENCOUNTER — Encounter: Payer: Self-pay | Admitting: Urology

## 2016-11-30 NOTE — Anesthesia Postprocedure Evaluation (Signed)
Anesthesia Post Note  Patient: BRANNEN KOPPEN  Procedure(s) Performed: Procedure(s) (LRB): CYSTOSCOPY/URETEROSCOPY/HOLMIUM LASER/STENT EXCHANGE (Left)  Patient location during evaluation: PACU Anesthesia Type: General Level of consciousness: awake and alert Pain management: pain level controlled Vital Signs Assessment: post-procedure vital signs reviewed and stable Respiratory status: spontaneous breathing, nonlabored ventilation, respiratory function stable and patient connected to nasal cannula oxygen Cardiovascular status: blood pressure returned to baseline and stable Postop Assessment: no signs of nausea or vomiting Anesthetic complications: no     Last Vitals:  Vitals:   11/29/16 1531 11/29/16 1550  BP: (!) 149/64 138/62  Pulse: 63 (!) 54  Resp:    Temp: 36.3 C     Last Pain:  Vitals:   11/30/16 0838  TempSrc:   PainSc: 3                  Precious Haws Beulah Matusek

## 2016-11-30 NOTE — Op Note (Signed)
Date of procedure: 11/29/16  Preoperative diagnosis:  1. Left ureteral stone 2. Left nonobstructing nephrolithiasis  Postoperative diagnosis:  1. Same as above   Procedure: 1. Left ureteroscopy 2. Laser lithotripsy 3. Basket extraction of Stone fragment 4. Left retrograde pyelogram 5. Left ureteral stent exchange  Surgeon: Hollice Espy, MD  Anesthesia: General  Complications: None  Intraoperative findings: Multiple left ureteral calculi including a dominant stone in the left distal ureter treated. Stent and fairly encrusted. Smaller nonobstructing calculi also treated.  EBL: Minimal  Specimens: Stone fragment  Drains: 6 x 26 French double-J ureteral stent with string left in place (left sided)  Indication: William Armstrong is a 66 y.o. patient with large obstructing left 13 mm ureteral calculus as well as nonobstructing calculi who underwent ureteral stent. He returns today for definitive management of his stone..  After reviewing the management options for treatment, he elected to proceed with the above surgical procedure(s). We have discussed the potential benefits and risks of the procedure, side effects of the proposed treatment, the likelihood of the patient achieving the goals of the procedure, and any potential problems that might occur during the procedure or recuperation. Informed consent has been obtained.  Description of procedure:  The patient was taken to the operating room and general anesthesia was induced.  The patient was placed in the dorsal lithotomy position, prepped and draped in the usual sterile fashion, and preoperative antibiotics were administered. A preoperative time-out was performed.   A 21 French cystoscope was advanced per urethra into the bladder. Attention was turned to the left ureteral orifice from which a ureteral stent was seen emanating. Of note, the stent did have significant encrustation along the distal coil. This was grasped and brought to  level of the urethral meatus. The stent was then cannulated using a sensor wire with initial some difficulty due to stent encrustation but ultimately successful to pass the stent up to level of the renal pelvis. The wire was then left in place the stent was removed. The wire was snapped in place as a safety wire. A 4.5 French semirigid ureteroscope was then brought in and advanced up to level of the distal ureter where a large dominant stone was encountered. A 270  laser fiber was then brought in and using the settings of 0.8 J and 10 Hz, this was fragmented into very small pieces. Each of these pieces were then extracted via 1.9 [place] to plus nitinol basket. A number of small fragments did a reflux up the ureter towards the proximal ureter. Along the length of the ureter, there was a lot of calcific stone burden and debris presumably related to calcification of his indwelling stent. All of this was removed as well using basket. Also length, was able to pass the scope all the way up to level of the proximal ureter and all ureteral stone debris was removed. A retrograde pyelogram was performed at this point in time to outline the upper tract collecting system. There was no extravasation noted, mild hydronephrosis appreciated and no ureteral perforation. A Super Stiff wire was then advanced through the scope to used as a working wire. An 8 French dual-lumen ureteroscope was then advanced over this wire into the renal pelvis. Some none shifting stone burden was identified within the renal pelvis, mid pole, and lower pole calyces. These are fragmented using testing settings of the laser fiber of 0.2 J and 40 Hz. To the amount of stone debris, the decision was made place an  access sheath. The Super Stiff wire was replaced and the 12/14 [place] Cook access sheath was advanced easily over the wire under fluoroscopic guidance to level of the proximal ureter. The inner cannula was removed and the scope was replaced back to  level the renal pelvis. This point time, extensive basketing was performed to remove the majority of any significant stone debris. Final retrograde pyelogram was performed again revealing no extravasation of contrast outlining the collecting system. This was then uses a roadmap to ensure that each and every calyx was visualized and no significant stone debris was residual. The scope was then backed down the length of the ureter removing the access sheath along the way. A few small stone fragments were extracted via basket until the ureter was cleared. No ureteral injury was appreciated and no significant ureteral trauma or edema. Finally, the safety wire was backloaded over a rigid cystoscope. A 6 x 26 French double-J ureteral stent was then advanced over the wire up to level of the renal pelvis. Wire was partially drawn until full coil was noted within the renal pelvis to the wire was then fully withdrawn and full coil was noted within the bladder. She was left affixed to the distal coil of the stent. An drained. The patient was then cleaned and dried, repositioned supine position, reversed from anesthesia. Prior to doing so, the stent string was affixed to the patient's glans using muscle Tegaderm.    Plan: Patient will remove his own stent on Monday. He'll follow-up in 4 weeks with a renal ultrasound prior.  Hollice Espy, M.D.

## 2016-12-06 ENCOUNTER — Encounter: Payer: Self-pay | Admitting: Urology

## 2016-12-08 LAB — STONE ANALYSIS
CA OXALATE, DIHYDRATE: 5 %
Ca Oxalate,Monohydr.: 90 %
Ca phos cry stone ql IR: 5 %
Stone Weight KSTONE: 13.9 mg

## 2016-12-12 ENCOUNTER — Ambulatory Visit: Payer: Medicare Other | Attending: Neurology

## 2016-12-12 DIAGNOSIS — Z9884 Bariatric surgery status: Secondary | ICD-10-CM | POA: Insufficient documentation

## 2016-12-12 DIAGNOSIS — G4733 Obstructive sleep apnea (adult) (pediatric): Secondary | ICD-10-CM | POA: Insufficient documentation

## 2016-12-21 ENCOUNTER — Encounter: Payer: Self-pay | Admitting: Urology

## 2016-12-22 ENCOUNTER — Emergency Department
Admission: EM | Admit: 2016-12-22 | Discharge: 2016-12-22 | Disposition: A | Discharge status: Home / Self Care | Payer: Medicare Other | Attending: Emergency Medicine | Admitting: Emergency Medicine

## 2016-12-22 ENCOUNTER — Encounter: Payer: Self-pay | Admitting: *Deleted

## 2016-12-22 ENCOUNTER — Emergency Department: Payer: Medicare Other

## 2016-12-22 DIAGNOSIS — Z7982 Long term (current) use of aspirin: Secondary | ICD-10-CM | POA: Diagnosis not present

## 2016-12-22 DIAGNOSIS — I10 Essential (primary) hypertension: Secondary | ICD-10-CM | POA: Insufficient documentation

## 2016-12-22 DIAGNOSIS — J449 Chronic obstructive pulmonary disease, unspecified: Secondary | ICD-10-CM | POA: Diagnosis not present

## 2016-12-22 DIAGNOSIS — I509 Heart failure, unspecified: Secondary | ICD-10-CM | POA: Diagnosis not present

## 2016-12-22 DIAGNOSIS — M79604 Pain in right leg: Secondary | ICD-10-CM | POA: Diagnosis present

## 2016-12-22 DIAGNOSIS — I110 Hypertensive heart disease with heart failure: Secondary | ICD-10-CM | POA: Diagnosis not present

## 2016-12-22 DIAGNOSIS — Z79899 Other long term (current) drug therapy: Secondary | ICD-10-CM | POA: Insufficient documentation

## 2016-12-22 DIAGNOSIS — M25561 Pain in right knee: Secondary | ICD-10-CM

## 2016-12-22 MED ORDER — KETOROLAC TROMETHAMINE 30 MG/ML IJ SOLN
30.0000 mg | Freq: Once | INTRAMUSCULAR | Status: AC
  Administered 2016-12-22: 30 mg via INTRAMUSCULAR
  Filled 2016-12-22: qty 1

## 2016-12-22 MED ORDER — KETOROLAC TROMETHAMINE 10 MG PO TABS: 10.0000 mg | ORAL_TABLET | Freq: Four times a day (QID) | ORAL | 0 refills | Status: AC | PRN

## 2016-12-22 NOTE — ED Notes (Signed)
ED Provider at bedside. 

## 2016-12-22 NOTE — Discharge Instructions (Signed)
Return to the emergency department if symptoms worsen.

## 2016-12-22 NOTE — ED Triage Notes (Signed)
Pt states hx of arthritis in his right knee, states on Wednesday he had the first injections in his knee of gelsyn-3 of his 3 series, states since the injection he has intense right leg pain, uses cane at baseline, awake and alert

## 2016-12-22 NOTE — ED Provider Notes (Signed)
[facility] Emergency Department Provider Note   ____________________________________________   I have reviewed the triage vital signs and the nursing notes.   HISTORY  Chief Complaint Leg Pain    HPI William Armstrong is a 66 y.o. male present's emergency department with right knee pain that began 3 days ago several hours after receiving initial hyal[facility]onic injection at a [facility]. Patient reports he history of right knee osteoarthritis and was starting a course of these injections as an option before total knee and replacement patient reported pain with injection that worsened as the day went on progressing to severe pain with weightbearing and knee flexion and extension. Patient denies noting increased swelling, redness, streaking of the skin, locking of the joint. Patient reported calling the patient information number at [facility] with no one returning their call he decided to come to the emergency department for evaluation. Patient denies fall or traumatic injury since receiving the injection. Patient denies fever, chills, headache, vision changes, chest pain, chest tightness, shortness of breath, abdominal pain, nausea and vomiting.  Past Medical History:  Diagnosis Date  . BCC (basal cell carcinoma of skin)   . CHF (congestive heart failure) ([place])   . Depression   . Glaucoma   . Heart attack ([place])   . History of kidney stones   . HTN (hypertension)   . Hypertension   . Kidney stone   . Sleep apnea     Patient Active Problem List   Diagnosis Date Noted  . Hematuria 11/18/2016  . Chronic respiratory failure with hypoxia ([place]) 01/06/2015  . Chronic bronchitis ([place]) 11/30/2014  . COPD, moderate ([place]) 11/30/2014  . H/O: depression 11/30/2014  . Old myocardial infarction 11/30/2014  . HLD (hyperlipidemia) 11/30/2014  . Low back pain 11/30/2014  . Extreme obesity 11/30/2014  . Arthritis of knee, degenerative 11/30/2014  . Back pain,  thoracic 11/30/2014  . Dermatophytosis of groin 11/30/2014  . Adiposity 07/08/2014  . Bariatric surgery status 07/08/2014  . Morbid obesity ([place]) 08/21/2013  . Glaucoma 03/14/2012  . Arthritis, degenerative 03/14/2012  . Ventricular fibrillation ([place]) 03/14/2012  . Arteriosclerosis of coronary artery 02/26/2012  . CAFL (chronic airflow limitation) ([place]) 02/26/2012  . BP (high blood pressure) 02/26/2012  . Chronic obstructive pulmonary disease ([place]) 02/26/2012  . Essential (primary) hypertension 07/09/2006  . Open-angle glaucoma 07/09/2006  . Hypoxemia 07/09/2006  . Obstructive apnea 07/09/2006    Past Surgical History:  Procedure Laterality Date  . CORONARY STENT PLACEMENT  02/2012  . CYSTOSCOPY W/ RETROGRADES Left 11/18/2016   Procedure: CYSTOSCOPY WITH RETROGRADE PYELOGRAM;  Surgeon: Irine Seal, MD;  Location: [facility] ORS;  Service: Urology;  Laterality: Left;  . CYSTOSCOPY WITH STENT PLACEMENT Left 11/18/2016   Procedure: CYSTOSCOPY WITH STENT PLACEMENT;  Surgeon: Irine Seal, MD;  Location: [facility] ORS;  Service: Urology;  Laterality: Left;  . CYSTOSCOPY/URETEROSCOPY/HOLMIUM LASER/STENT PLACEMENT Left 11/29/2016   Procedure: CYSTOSCOPY/URETEROSCOPY/HOLMIUM LASER/STENT EXCHANGE;  Surgeon: Hollice Espy, MD;  Location: [facility] ORS;  Service: Urology;  Laterality: Left;  . LAPAROSCOPIC GASTRIC RESTRICTIVE DUODENAL PROCEDURE (DUODENAL SWITCH)    . LAPAROSCOPIC GASTRIC SLEEVE RESECTION  08/21/2013  . skin removal surgery     removal of extra skin approx 20 lbs  . TONSILLECTOMY AND ADENOIDECTOMY  1960    Prior to Admission medications   Medication Sig Start Date End Date Taking? Authorizing Provider  Aspirin 81 MG EC tablet Take 81 mg by mouth daily.  07/09/06   [provider]  CALCIUM PO Take 1  tablet by mouth daily.     [provider]  dorzolamide (TRUSOPT) 2 % ophthalmic solution Place 1 drop into both eyes 3 (three) times daily.     [provider]  econazole  nitrate 1 % cream Apply 1 application topically 2 (two) times daily as needed (for rash).     [provider]  HYDROcodone-acetaminophen (NORCO/VICODIN) 5-325 MG tablet Take 1-2 tablets by mouth every 6 (six) hours as needed for moderate pain. 11/29/16   Hollice Espy, MD  ketorolac (TORADOL) 10 MG tablet Take 1 tablet (10 mg total) by mouth every 6 (six) hours as needed. 12/22/16 12/27/16  Sheridan Hew M, PA-C  latanoprost (XALATAN) 0.005 % ophthalmic solution Place 1 drop into both eyes at bedtime. 05/11/16   [provider]  lisinopril (PRINIVIL,ZESTRIL) 40 MG tablet Take 40 mg by mouth daily.  08/19/16   [provider]  Multiple Vitamin (MULTIVITAMIN WITH MINERALS) TABS tablet Take 1 tablet by mouth daily.    [provider]  neomycin-bacitracin-polymyxin (NEOSPORIN) ointment Apply 1 application topically 3 (three) times daily as needed for wound care. apply to eye    [provider]  nystatin cream (MYCOSTATIN) Apply 1 application topically 2 (two) times daily as needed (for rash).  04/06/15   [provider]  oxybutynin (DITROPAN) 5 MG tablet Take 1 tablet (5 mg total) by mouth every 8 (eight) hours as needed for bladder spasms. 11/29/16   Hollice Espy, MD  oxyCODONE (OXY IR/ROXICODONE) 5 MG immediate release tablet Take 1 tablet (5 mg total) by mouth every 6 (six) hours as needed for moderate pain. Patient not taking: Reported on 11/28/2016 11/19/16   Bettey Costa, MD  rosuvastatin (CRESTOR) 10 MG tablet Take 10 mg by mouth daily.     [provider]  sertraline (ZOLOFT) 100 MG tablet Take 1 tablet (100 mg total) by mouth daily. 08/21/16   Chrismon, Vickki Muff, PA  sildenafil (REVATIO) 20 MG tablet Take 100 mg by mouth daily as needed (for erectile dysfunction).    [provider]  tamsulosin (FLOMAX) 0.4 MG CAPS capsule Take 1 capsule (0.4 mg total) by mouth daily. 09/12/16   Zara Council A, PA-C  zinc gluconate 50 MG tablet  Take 50 mg by mouth daily.    [provider]    Allergies Atorvastatin and Pregabalin  Family History  Problem Relation Age of Onset  . Heart attack Mother   . Brain cancer Father   . Heart attack Sister   . Congenital heart disease Sister   . Leukemia Paternal Grandmother   . COPD Brother   . Heart disease Brother   . Prostate cancer Neg Hx   . Kidney cancer Neg Hx   . Bladder Cancer Neg Hx     Social History Social History  Substance Use Topics  . Smoking status: Never Smoker  . Smokeless tobacco: Never Used  . Alcohol use No    Review of Systems Constitutional: Negative for fever/chills Eyes: No visual changes. ENT:  Negative for sore throat and for difficulty swallowing Cardiovascular: Denies chest pain. Respiratory: Denies cough. Denies shortness of breath. Gastrointestinal: No abdominal pain.  No nausea, vomiting, diarrhea. Musculoskeletal: Right knee pain.  Skin: Negative for rash. Neurological: Negative for headaches.  Negative focal weakness or numbness. Negative for loss of consciousness. Able to ambulate with severe pain. ____________________________________________   PHYSICAL EXAM:  VITAL SIGNS: ED Triage Vitals  Enc Vitals Group     BP 12/22/16 1450 Marland Kitchen)  161/66     Pulse Rate 12/22/16 1450 (!) 52     Resp 12/22/16 1450 18     Temp 12/22/16 1450 98.4 F (36.9 C)     Temp Source 12/22/16 1450 Oral     SpO2 12/22/16 1450 97 %     Weight 12/22/16 1451 245 lb (111.1 kg)     Height 12/22/16 1451 5' 7.5" (1.715 m)     Head Circumference --      Peak Flow --      Pain Score 12/22/16 1454 10     Pain Loc --      Pain Edu? --      Excl. in [place]? --     Constitutional: Alert and oriented. Well appearing and in no acute distress.  Head: Normocephalic and atraumatic. Eyes: Conjunctivae are normal. PERRL. N Cardiovascular: Normal rate, regular rhythm. Normal distal pulses. Respiratory: Normal respiratory effort. No wheezes/rales/rhonchi. Lungs  CTAB Musculoskeletal: Right knee pain: Increases with flexion and extension and weight-bearing. No swelling or erythema noted. Small, pinpoint red dot noted anterior lateral joint line, likely injection site.  Point tenderness along the anterior lateral joint line. Otherwise, nontender with normal range of motion in all extremities. Neurologic: Normal speech and language.  Skin:  Skin is warm, dry and intact. No rash noted. Psychiatric: Mood and affect are normal.  ____________________________________________   LABS (all labs ordered are listed, but only abnormal results are displayed)  Labs Reviewed - No data to display ____________________________________________  EKG  none ____________________________________________  RADIOLOGY  DG right knee 4 view FINDINGS: Diffuse osteopenia. Mild degenerative change medial and patellofemoral compartments. No acute bony or joint abnormality identified. No evidence of fracture or dislocation. Peripheral vascular calcification.  IMPRESSION: 1. No acute bony abnormality. Diffuse osteopenia. Degenerative change medial and patellofemoral compartments.  2. Peripheral vascular disease . ____________________________________________   PROCEDURES  Procedure(s) performed: no    Critical Care performed: no ____________________________________________   INITIAL IMPRESSION / ASSESSMENT AND PLAN / ED COURSE  Pertinent labs & imaging results that were available during my care of the patient were reviewed by me and considered in my medical decision making (see chart for details).  Patient presented with atraumatic right knee pain 3 days. Patient history, physical exam findings and imaging are reassuring of no acute fracture, infection or strain/sprain injury pattern. Patient's knee pain symptoms are likely related to pain and inflammatory response secondary to recent Gelsyn injection he received 3 days ago. Patient responded well to Toradol 30 mg  IM given during course of care in the emergency department. Patient will be given a course of Toradol for continued pain management. Patient will be following up with the clinic he received the injection from as well as following up with his orthopedic provider on Monday for continued care. Patient was advised to return the emergency department for symptoms that change or worsen. Patient informed of clinical course, understand medical decision-making process, and agree with plan.       ____________________________________________   FINAL CLINICAL IMPRESSION(S) / ED DIAGNOSES  Final diagnoses:  Acute pain of right knee       NEW MEDICATIONS STARTED DURING THIS VISIT:  New Prescriptions   KETOROLAC (TORADOL) 10 MG TABLET    Take 1 tablet (10 mg total) by mouth every 6 (six) hours as needed.     Note:  This document was prepared using Dragon voice recognition software and may include unintentional dictation errors.    Hezekiah Veltre, Laroy Apple, PA-C 12/22/16  1734    Earleen Newport, MD 12/22/16 [phone]

## 2016-12-22 NOTE — ED Notes (Signed)
NAD noted at time of D/C. Pt denies questions or concerns. Pt taken to the lobby via wheelchair at this time. PA aware of patient's vitals, pt states that those vitals are normal for him due to stents.

## 2016-12-26 ENCOUNTER — Ambulatory Visit
Admission: RE | Admit: 2016-12-26 | Discharge: 2016-12-26 | Disposition: A | Discharge status: Home / Self Care | Payer: Medicare Other | Source: Ambulatory Visit | Attending: Urology | Admitting: Urology

## 2016-12-26 DIAGNOSIS — N281 Cyst of kidney, acquired: Secondary | ICD-10-CM | POA: Insufficient documentation

## 2016-12-26 DIAGNOSIS — N201 Calculus of ureter: Secondary | ICD-10-CM

## 2016-12-26 DIAGNOSIS — N200 Calculus of kidney: Secondary | ICD-10-CM | POA: Diagnosis not present

## 2016-12-27 NOTE — Progress Notes (Signed)
12/28/2016 2:02 PM   William Armstrong 29-May-1951 [id number]  Referring provider: [name], [place],  [id number]  Chief Complaint  Patient presents with  . Results    RUS    HPI: 66 yo WM who is s/p left URS who presents today for a one month follow up.  Patient underwent  left ureteroscopy, laser lithotripsy, basket extraction of stone fragment, left retrograde pyelogram and left ureteral stent exchange for a 13 mm left distal ureteral stone and smaller renal stone.    Since the procedure, he has not had any issues.  He pulled his own stent.  He denies gross hematuria, dysuria and suprapubic pain.  He has not had fevers, chills,nausea or vomiting.     RUS performed on 12/26/2016 noted no hydronephrosis, bilateral cysts and bilateral nephrolithiasis.     PMH: Past Medical History:  Diagnosis Date  . BCC (basal cell carcinoma of skin)   . CHF (congestive heart failure) ([place])   . Depression   . Glaucoma   . Heart attack ([place])   . History of kidney stones   . HTN (hypertension)   . Hypertension   . Kidney stone   . Sleep apnea     Surgical History: Past Surgical History:  Procedure Laterality Date  . CORONARY STENT PLACEMENT  02/2012  . CYSTOSCOPY W/ RETROGRADES Left 11/18/2016   Procedure: CYSTOSCOPY WITH RETROGRADE PYELOGRAM;  Surgeon: Irine Seal, MD;  Location: [facility] ORS;  Service: Urology;  Laterality: Left;  . CYSTOSCOPY WITH STENT PLACEMENT Left 11/18/2016   Procedure: CYSTOSCOPY WITH STENT PLACEMENT;  Surgeon: Irine Seal, MD;  Location: [facility] ORS;  Service: Urology;  Laterality: Left;  . CYSTOSCOPY/URETEROSCOPY/HOLMIUM LASER/STENT PLACEMENT Left 11/29/2016   Procedure: CYSTOSCOPY/URETEROSCOPY/HOLMIUM LASER/STENT EXCHANGE;  Surgeon: Hollice Espy, MD;  Location: [facility] ORS;  Service: Urology;  Laterality: Left;  . LAPAROSCOPIC GASTRIC RESTRICTIVE DUODENAL PROCEDURE (DUODENAL SWITCH)    . LAPAROSCOPIC GASTRIC SLEEVE RESECTION   08/21/2013  . skin removal surgery     removal of extra skin approx 20 lbs  . TONSILLECTOMY AND ADENOIDECTOMY  1960    Home Medications:  Allergies as of 12/28/2016      Reactions   Atorvastatin Other (See Comments)   Muscle aches.   Pregabalin Other (See Comments)   soreness      Medication List       Accurate as of 12/28/16  2:02 PM. Always use your most recent med list.          Aspirin 81 MG EC tablet Take 81 mg by mouth daily.   CALCIUM PO Take 1 tablet by mouth daily.   CRESTOR 10 MG tablet Generic drug:  rosuvastatin Take 10 mg by mouth daily.   dorzolamide 2 % ophthalmic solution Commonly known as:  TRUSOPT Place 1 drop into both eyes 3 (three) times daily.   econazole nitrate 1 % cream Apply 1 application topically 2 (two) times daily as needed (for rash).   HYDROcodone-acetaminophen 5-325 MG tablet Commonly known as:  NORCO/VICODIN Take 1-2 tablets by mouth every 6 (six) hours as needed for moderate pain.   latanoprost 0.005 % ophthalmic solution Commonly known as:  XALATAN Place 1 drop into both eyes at bedtime.   lisinopril 40 MG tablet Commonly known as:  PRINIVIL,ZESTRIL Take 40 mg by mouth daily.   multivitamin with minerals Tabs tablet Take 1 tablet by mouth daily.   neomycin-bacitracin-polymyxin ointment Commonly known as:  NEOSPORIN Apply 1 application topically 3 (three)  times daily as needed for wound care. apply to eye   nystatin cream Commonly known as:  MYCOSTATIN Apply 1 application topically 2 (two) times daily as needed (for rash).   oxybutynin 5 MG tablet Commonly known as:  DITROPAN Take 1 tablet (5 mg total) by mouth every 8 (eight) hours as needed for bladder spasms.   oxyCODONE 5 MG immediate release tablet Commonly known as:  Oxy IR/ROXICODONE Take 1 tablet (5 mg total) by mouth every 6 (six) hours as needed for moderate pain.   sertraline 100 MG tablet Commonly known as:  ZOLOFT Take 1 tablet (100 mg total) by  mouth daily.   sildenafil 20 MG tablet Commonly known as:  REVATIO Take 100 mg by mouth daily as needed (for erectile dysfunction).   tamsulosin 0.4 MG Caps capsule Commonly known as:  FLOMAX Take 1 capsule (0.4 mg total) by mouth daily.   traMADol 50 MG tablet Commonly known as:  ULTRAM Take by mouth.   zinc gluconate 50 MG tablet Take 50 mg by mouth daily.       Allergies:  Allergies  Allergen Reactions  . Atorvastatin Other (See Comments)    Muscle aches.  . Pregabalin Other (See Comments)    soreness    Family History: Family History  Problem Relation Age of Onset  . Heart attack Mother   . Brain cancer Father   . Heart attack Sister   . Congenital heart disease Sister   . Leukemia Paternal Grandmother   . COPD Brother   . Heart disease Brother   . Prostate cancer Neg Hx   . Kidney cancer Neg Hx   . Bladder Cancer Neg Hx     Social History:  reports that he has never smoked. He has never used smokeless tobacco. He reports that he does not drink alcohol or use drugs.  ROS: UROLOGY Frequent Urination?: No Hard to postpone urination?: No Burning/pain with urination?: No Get up at night to urinate?: No Leakage of urine?: No Urine stream starts and stops?: No Trouble starting stream?: No Do you have to strain to urinate?: No Blood in urine?: No Urinary tract infection?: No Sexually transmitted disease?: No Injury to kidneys or bladder?: No Painful intercourse?: No Weak stream?: No Erection problems?: No Penile pain?: No  Gastrointestinal Nausea?: No Vomiting?: No Indigestion/heartburn?: No Diarrhea?: No Constipation?: No  Constitutional Fever: No Night sweats?: No Weight loss?: Yes Fatigue?: No  Skin Skin rash/lesions?: No Itching?: No  Eyes Blurred vision?: No Double vision?: No  Ears/Nose/Throat Sore throat?: No Sinus problems?: No  Hematologic/Lymphatic Swollen glands?: No Easy bruising?: No  Cardiovascular Leg  swelling?: No Chest pain?: No  Respiratory Cough?: No Shortness of breath?: No  Endocrine Excessive thirst?: No  Musculoskeletal Back pain?: No Joint pain?: Yes  Neurological Headaches?: No Dizziness?: No  Psychologic Depression?: Yes Anxiety?: No  Physical Exam: BP (!) 162/85   Pulse 60   Ht 5' 7.5" (1.715 m)   Wt 245 lb (111.1 kg)   BMI 37.81 kg/m   Constitutional:  Alert and oriented, No acute distress.  Accompanied by wife today. HEENT: [place] AT, moist mucus membranes.  Trachea midline, no masses. Cardiovascular: No clubbing, cyanosis, or edema. Respiratory: Normal respiratory effort, no increased work of breathing. GI: Abdomen is soft, nontender, nondistended, no abdominal masses.  Obese. GU: No CVA tenderness.  Skin: No rashes, bruises or suspicious lesions. Neurologic: Grossly intact, no focal deficits, moving all 4 extremities. Psychiatric: Normal mood and affect.  Laboratory  Data: Lab Results  Component Value Date   WBC 9.1 11/19/2016   HGB 11.0 (L) 11/19/2016   HCT 33.0 (L) 11/19/2016   MCV 86.1 11/19/2016   PLT 188 11/19/2016    Lab Results  Component Value Date   CREATININE 0.56 (L) 11/19/2016     Results for orders placed or performed during the hospital encounter of 11/29/16  Stone analysis  Result Value Ref Range   Color Brown    Size Comment mm   Stone Weight KSTONE 13.9 mg   Nidus No Nidus visualized    Ca Oxalate,Dihydrate 5 %   Ca Oxalate,Monohydr. 90 %   Ca phos cry stone ql IR 5 %   Composition Comment    Photo Comment    Comment: Comment    PLEASE NOTE: Comment    Disclaimer - Kidney Stone Analysis: Comment    I have reviewed the labs  Pertinent Imaging: CLINICAL DATA:  History of lithotripsy for left-sided ureteral stone on November 29, 2016  EXAM: RENAL / URINARY TRACT ULTRASOUND COMPLETE  COMPARISON:  CT scan of the abdomen and pelvis of November 18, 2016.  FINDINGS: Right Kidney:  Length: 14 cm. Multiple kidney  stones are observed with the largest lying in the midpole measuring 10 mm in diameter. There is an upper pole cyst measuring 3.1 x 2.4 x 1.7 cm. There is a lower pole cyst measuring 2.3 x 1.5 x 2 cm. The renal cortical echotexture remains lower than that of the liver.  Left Kidney:  Length: 13.4 cm. There is a lower pole stone measuring 9 mm in greatest dimension. There is an upper pole cyst measuring 1.2 x 1.1 x 1.1 cm. There is no hydronephrosis. The renal cortical echotexture is similar to that on the right.  Bladder:  The urinary bladder is only partially distended.  IMPRESSION: There is no hydronephrosis. There bilateral kidney stones and simple appearing cysts.   Electronically Signed   By: David  Martinique M.D.   On: 12/26/2016 11:33  RUS personally reviewed today with the patient.  Assessment & Plan:    1. Left ureteral stone  - s/p left URS  - no hydronephrosis seen on RUS   2. Kidney stones  - patient would like to wait until after his hip surgery to address the right stones  - RTC in late September  - Advised to contact our office or seek treatment in the ED if becomes febrile or pain/ vomiting are difficult control in order to arrange for emergent/urgent intervention Zara Council, PA-C  [facility] [address] [place], [place] [address] [phone]

## 2016-12-28 ENCOUNTER — Ambulatory Visit (INDEPENDENT_AMBULATORY_CARE_PROVIDER_SITE_OTHER): Payer: Medicare Other | Admitting: Urology

## 2016-12-28 ENCOUNTER — Encounter: Payer: Self-pay | Admitting: Urology

## 2016-12-28 VITALS — BP 162/85 | HR 60 | Ht 67.5 in | Wt 245.0 lb

## 2016-12-28 DIAGNOSIS — N201 Calculus of ureter: Secondary | ICD-10-CM

## 2016-12-28 DIAGNOSIS — N200 Calculus of kidney: Secondary | ICD-10-CM

## 2017-01-10 ENCOUNTER — Encounter
Admission: RE | Admit: 2017-01-10 | Discharge: 2017-01-10 | Disposition: A | Discharge status: Home / Self Care | Payer: Medicare Other | Source: Ambulatory Visit | Attending: Orthopedic Surgery | Admitting: Orthopedic Surgery

## 2017-01-10 DIAGNOSIS — R0902 Hypoxemia: Secondary | ICD-10-CM | POA: Insufficient documentation

## 2017-01-10 DIAGNOSIS — J449 Chronic obstructive pulmonary disease, unspecified: Secondary | ICD-10-CM | POA: Insufficient documentation

## 2017-01-10 DIAGNOSIS — Z9884 Bariatric surgery status: Secondary | ICD-10-CM | POA: Insufficient documentation

## 2017-01-10 DIAGNOSIS — I4901 Ventricular fibrillation: Secondary | ICD-10-CM | POA: Insufficient documentation

## 2017-01-10 DIAGNOSIS — Z01812 Encounter for preprocedural laboratory examination: Secondary | ICD-10-CM | POA: Diagnosis present

## 2017-01-10 DIAGNOSIS — I2510 Atherosclerotic heart disease of native coronary artery without angina pectoris: Secondary | ICD-10-CM | POA: Insufficient documentation

## 2017-01-10 DIAGNOSIS — M545 Low back pain: Secondary | ICD-10-CM | POA: Diagnosis not present

## 2017-01-10 DIAGNOSIS — I10 Essential (primary) hypertension: Secondary | ICD-10-CM | POA: Insufficient documentation

## 2017-01-10 DIAGNOSIS — B356 Tinea cruris: Secondary | ICD-10-CM | POA: Insufficient documentation

## 2017-01-10 DIAGNOSIS — E668 Other obesity: Secondary | ICD-10-CM | POA: Insufficient documentation

## 2017-01-10 DIAGNOSIS — J42 Unspecified chronic bronchitis: Secondary | ICD-10-CM | POA: Diagnosis not present

## 2017-01-10 DIAGNOSIS — M546 Pain in thoracic spine: Secondary | ICD-10-CM | POA: Diagnosis not present

## 2017-01-10 DIAGNOSIS — H409 Unspecified glaucoma: Secondary | ICD-10-CM | POA: Insufficient documentation

## 2017-01-10 DIAGNOSIS — E785 Hyperlipidemia, unspecified: Secondary | ICD-10-CM | POA: Insufficient documentation

## 2017-01-10 DIAGNOSIS — M1990 Unspecified osteoarthritis, unspecified site: Secondary | ICD-10-CM | POA: Diagnosis not present

## 2017-01-10 DIAGNOSIS — Z8659 Personal history of other mental and behavioral disorders: Secondary | ICD-10-CM | POA: Diagnosis not present

## 2017-01-10 HISTORY — DX: Other complications of anesthesia, initial encounter: T88.59XA

## 2017-01-10 HISTORY — DX: Obesity, unspecified: E66.9

## 2017-01-10 HISTORY — DX: Atherosclerotic heart disease of native coronary artery without angina pectoris: I25.10

## 2017-01-10 HISTORY — DX: Hyperglycemia, unspecified: R73.9

## 2017-01-10 HISTORY — DX: Chronic obstructive pulmonary disease, unspecified: J44.9

## 2017-01-10 HISTORY — DX: Hypokalemia: E87.6

## 2017-01-10 HISTORY — DX: Adverse effect of unspecified anesthetic, initial encounter: T41.45XA

## 2017-01-10 HISTORY — DX: Anemia, unspecified: D64.9

## 2017-01-10 HISTORY — DX: Unspecified osteoarthritis, unspecified site: M19.90

## 2017-01-10 LAB — BASIC METABOLIC PANEL
ANION GAP: 9 (ref 5–15)
BUN: 17 mg/dL (ref 6–20)
CALCIUM: 9 mg/dL (ref 8.9–10.3)
CO2: 27 mmol/L (ref 22–32)
Chloride: 105 mmol/L (ref 101–111)
Creatinine, Ser: 0.44 mg/dL — ABNORMAL LOW (ref 0.61–1.24)
Glucose, Bld: 83 mg/dL (ref 65–99)
POTASSIUM: 3.4 mmol/L — AB (ref 3.5–5.1)
Sodium: 141 mmol/L (ref 135–145)

## 2017-01-10 LAB — PROTIME-INR
INR: 1.02
Prothrombin Time: 13.4 seconds (ref 11.4–15.2)

## 2017-01-10 LAB — URINALYSIS, ROUTINE W REFLEX MICROSCOPIC
Bacteria, UA: NONE SEEN
Bilirubin Urine: NEGATIVE
GLUCOSE, UA: NEGATIVE mg/dL
Ketones, ur: NEGATIVE mg/dL
LEUKOCYTES UA: NEGATIVE
Nitrite: NEGATIVE
PH: 5 (ref 5.0–8.0)
Protein, ur: NEGATIVE mg/dL
SPECIFIC GRAVITY, URINE: 1.018 (ref 1.005–1.030)
SQUAMOUS EPITHELIAL / LPF: NONE SEEN

## 2017-01-10 LAB — CBC
HEMATOCRIT: 36.1 % — AB (ref 40.0–52.0)
HEMOGLOBIN: 12 g/dL — AB (ref 13.0–18.0)
MCH: 28.5 pg (ref 26.0–34.0)
MCHC: 33.3 g/dL (ref 32.0–36.0)
MCV: 85.6 fL (ref 80.0–100.0)
Platelets: 282 10*3/uL (ref 150–440)
RBC: 4.22 MIL/uL — ABNORMAL LOW (ref 4.40–5.90)
RDW: 14.5 % (ref 11.5–14.5)
WBC: 8.2 10*3/uL (ref 3.8–10.6)

## 2017-01-10 LAB — SURGICAL PCR SCREEN
MRSA, PCR: NEGATIVE
STAPHYLOCOCCUS AUREUS: NEGATIVE

## 2017-01-10 LAB — APTT: APTT: 31 s (ref 24–36)

## 2017-01-10 LAB — SEDIMENTATION RATE: Sed Rate: 26 mm/hr — ABNORMAL HIGH (ref 0–20)

## 2017-01-10 NOTE — Patient Instructions (Signed)
  Your procedure is scheduled [id number] August 16 , 2018. Report to Same Day Surgery. To find out your arrival time please call [phone] between 1PM - 3PM on Wednesday January 17, 2017.  Remember: Instructions that are not followed completely may result in serious medical risk, up to and including death, or upon the discretion of your surgeon and anesthesiologist your surgery may need to be rescheduled.    _x___ 1. Do not eat food or drink liquids after midnight. No gum chewing or hard candies.     ____ 2. No Alcohol for 24 hours before or after surgery.   ____ 3. Bring all medications with you on the day of surgery if instructed.    __x__ 4. Notify your doctor if there is any change in your medical condition     (cold, fever, infections).    _____ 5. No smoking 24 hours prior to surgery.     Do not wear jewelry, make-up, hairpins, clips or nail polish.  Do not wear lotions, powders, or perfumes.   Do not shave 48 hours prior to surgery. Men may shave face and neck.  Do not bring valuables to the hospital.    [facility] is not responsible for any belongings or valuables.               Contacts, dentures or bridgework may not be worn into surgery.  Leave your suitcase in the car. After surgery it may be brought to your room.  For patients admitted to the hospital, discharge time is determined by your treatment team.   Patients discharged the day of surgery will not be allowed to drive home.    Please read over the following fact sheets that you were given:   [facility] Preparing for Surgery  _x___ Take these medicines the morning of surgery with A SIP OF WATER:    1. lisinopril (PRINIVIL,ZESTRIL)  2. rosuvastatin (CRESTOR)  3. sertraline (ZOLOFT)    ____ Fleet Enema (as directed)   _x___ Use CHG Soap as directed on instruction sheet  ____ Use inhalers on the day of surgery and bring to hospital day of surgery  ____ Stop metformin 2 days prior to surgery    ____  Take 1/2 of usual insulin dose the night before surgery and none on the morning of          surgery.   ____ Stop aspirin 7 days prior to surgery.  _x___ Stop Anti-inflammatories such as Advil, Aleve, Ibuprofen, Motrin, Naproxen, Naprosyn, Goodies powders or aspirin  products. OK to take Tylenol or oxycodone or tramadol.   _x___ Stop supplements: zinc gluconate  until after surgery.    ____ Bring C-Pap to the hospital.

## 2017-01-11 LAB — URINE CULTURE: CULTURE: NO GROWTH

## 2017-01-11 LAB — TYPE AND SCREEN
ABO/RH(D): O POS
ANTIBODY SCREEN: NEGATIVE

## 2017-01-17 MED ORDER — CEFAZOLIN SODIUM-DEXTROSE 2-4 GM/100ML-% IV SOLN
2.0000 g | Freq: Once | INTRAVENOUS | Status: AC
  Administered 2017-01-18: 2 g via INTRAVENOUS

## 2017-01-18 ENCOUNTER — Inpatient Hospital Stay: Payer: Medicare Other | Admitting: Certified Registered Nurse Anesthetist

## 2017-01-18 ENCOUNTER — Encounter: Payer: Self-pay | Admitting: *Deleted

## 2017-01-18 ENCOUNTER — Inpatient Hospital Stay
Admission: RE | Admit: 2017-01-18 | Discharge: 2017-01-20 | DRG: 470 | Disposition: A | Discharge status: Home / Self Care | Payer: Medicare Other | Source: Ambulatory Visit | Attending: Orthopedic Surgery | Admitting: Orthopedic Surgery

## 2017-01-18 ENCOUNTER — Inpatient Hospital Stay: Payer: Medicare Other

## 2017-01-18 ENCOUNTER — Encounter
Admission: RE | Disposition: A | Discharge status: Home / Self Care | Payer: Self-pay | Source: Ambulatory Visit | Attending: Orthopedic Surgery

## 2017-01-18 DIAGNOSIS — I509 Heart failure, unspecified: Secondary | ICD-10-CM | POA: Diagnosis present

## 2017-01-18 DIAGNOSIS — I2510 Atherosclerotic heart disease of native coronary artery without angina pectoris: Secondary | ICD-10-CM | POA: Diagnosis present

## 2017-01-18 DIAGNOSIS — I110 Hypertensive heart disease with heart failure: Secondary | ICD-10-CM | POA: Diagnosis present

## 2017-01-18 DIAGNOSIS — H409 Unspecified glaucoma: Secondary | ICD-10-CM | POA: Diagnosis present

## 2017-01-18 DIAGNOSIS — Z419 Encounter for procedure for purposes other than remedying health state, unspecified: Secondary | ICD-10-CM

## 2017-01-18 DIAGNOSIS — Z9884 Bariatric surgery status: Secondary | ICD-10-CM

## 2017-01-18 DIAGNOSIS — J449 Chronic obstructive pulmonary disease, unspecified: Secondary | ICD-10-CM | POA: Diagnosis present

## 2017-01-18 DIAGNOSIS — Z6837 Body mass index (BMI) 37.0-37.9, adult: Secondary | ICD-10-CM

## 2017-01-18 DIAGNOSIS — I252 Old myocardial infarction: Secondary | ICD-10-CM

## 2017-01-18 DIAGNOSIS — G4733 Obstructive sleep apnea (adult) (pediatric): Secondary | ICD-10-CM | POA: Diagnosis present

## 2017-01-18 DIAGNOSIS — G8918 Other acute postprocedural pain: Secondary | ICD-10-CM

## 2017-01-18 DIAGNOSIS — D62 Acute posthemorrhagic anemia: Secondary | ICD-10-CM | POA: Diagnosis not present

## 2017-01-18 DIAGNOSIS — Z85828 Personal history of other malignant neoplasm of skin: Secondary | ICD-10-CM | POA: Diagnosis not present

## 2017-01-18 DIAGNOSIS — M1611 Unilateral primary osteoarthritis, right hip: Secondary | ICD-10-CM | POA: Diagnosis present

## 2017-01-18 HISTORY — PX: TOTAL HIP ARTHROPLASTY: SHX124

## 2017-01-18 LAB — CBC
HCT: 34.5 % — ABNORMAL LOW (ref 40.0–52.0)
HEMOGLOBIN: 11.2 g/dL — AB (ref 13.0–18.0)
MCH: 27.9 pg (ref 26.0–34.0)
MCHC: 32.6 g/dL (ref 32.0–36.0)
MCV: 85.5 fL (ref 80.0–100.0)
Platelets: 209 10*3/uL (ref 150–440)
RBC: 4.04 MIL/uL — AB (ref 4.40–5.90)
RDW: 14.6 % — ABNORMAL HIGH (ref 11.5–14.5)
WBC: 13.3 10*3/uL — ABNORMAL HIGH (ref 3.8–10.6)

## 2017-01-18 LAB — CREATININE, SERUM: CREATININE: 0.45 mg/dL — AB (ref 0.61–1.24)

## 2017-01-18 LAB — ABO/RH: ABO/RH(D): O POS

## 2017-01-18 SURGERY — ARTHROPLASTY, HIP, TOTAL, ANTERIOR APPROACH
Anesthesia: Spinal | Laterality: Right | Wound class: Clean

## 2017-01-18 MED ORDER — PROPOFOL 10 MG/ML IV BOLUS
INTRAVENOUS | Status: DC | PRN
  Administered 2017-01-18: 30 mg via INTRAVENOUS

## 2017-01-18 MED ORDER — ADULT MULTIVITAMIN W/MINERALS CH
1.0000 | ORAL_TABLET | Freq: Every day | ORAL | Status: DC
  Administered 2017-01-19 – 2017-01-20 (×2): 1 via ORAL
  Filled 2017-01-18 (×2): qty 1

## 2017-01-18 MED ORDER — ACETAMINOPHEN 10 MG/ML IV SOLN
INTRAVENOUS | Status: AC
  Filled 2017-01-18: qty 100

## 2017-01-18 MED ORDER — BISACODYL 10 MG RE SUPP: 10.0000 mg | Freq: Every day | RECTAL | Status: DC | PRN

## 2017-01-18 MED ORDER — LACTATED RINGERS IV SOLN
INTRAVENOUS | Status: DC
  Administered 2017-01-18 (×2): via INTRAVENOUS

## 2017-01-18 MED ORDER — ACETAMINOPHEN 325 MG PO TABS: 650.0000 mg | ORAL_TABLET | Freq: Four times a day (QID) | ORAL | Status: DC | PRN

## 2017-01-18 MED ORDER — ONDANSETRON HCL 4 MG PO TABS: 4.0000 mg | ORAL_TABLET | Freq: Four times a day (QID) | ORAL | Status: DC | PRN

## 2017-01-18 MED ORDER — FENTANYL CITRATE (PF) 100 MCG/2ML IJ SOLN
INTRAMUSCULAR | Status: DC | PRN
  Administered 2017-01-18: 50 ug via INTRAVENOUS

## 2017-01-18 MED ORDER — ACETAMINOPHEN 10 MG/ML IV SOLN
INTRAVENOUS | Status: DC | PRN
  Administered 2017-01-18: 1000 mg via INTRAVENOUS

## 2017-01-18 MED ORDER — LISINOPRIL 20 MG PO TABS
40.0000 mg | ORAL_TABLET | Freq: Every day | ORAL | Status: DC
  Administered 2017-01-19 – 2017-01-20 (×2): 40 mg via ORAL
  Filled 2017-01-18 (×2): qty 2

## 2017-01-18 MED ORDER — BUPIVACAINE HCL (PF) 0.5 % IJ SOLN
INTRAMUSCULAR | Status: DC | PRN
  Administered 2017-01-18: 3 mL

## 2017-01-18 MED ORDER — DEXTROSE 5 % IV SOLN
500.0000 mg | Freq: Four times a day (QID) | INTRAVENOUS | Status: DC | PRN
  Filled 2017-01-18: qty 5

## 2017-01-18 MED ORDER — NEOMYCIN-POLYMYXIN B GU 40-200000 IR SOLN
Status: DC | PRN
  Administered 2017-01-18: 4 mL

## 2017-01-18 MED ORDER — OXYCODONE HCL 5 MG PO TABS
5.0000 mg | ORAL_TABLET | ORAL | Status: DC | PRN
  Administered 2017-01-18 – 2017-01-20 (×10): 10 mg via ORAL
  Filled 2017-01-18 (×10): qty 2

## 2017-01-18 MED ORDER — DORZOLAMIDE HCL 2 % OP SOLN
1.0000 [drp] | Freq: Three times a day (TID) | OPHTHALMIC | Status: DC
  Administered 2017-01-18 – 2017-01-20 (×4): 1 [drp] via OPHTHALMIC
  Filled 2017-01-18: qty 10

## 2017-01-18 MED ORDER — ROSUVASTATIN CALCIUM 10 MG PO TABS
10.0000 mg | ORAL_TABLET | ORAL | Status: DC
  Administered 2017-01-20: 10 mg via ORAL
  Filled 2017-01-18: qty 1

## 2017-01-18 MED ORDER — FAMOTIDINE 20 MG PO TABS
20.0000 mg | ORAL_TABLET | Freq: Once | ORAL | Status: AC
  Administered 2017-01-18: 20 mg via ORAL

## 2017-01-18 MED ORDER — TRANEXAMIC ACID 1000 MG/10ML IV SOLN
INTRAVENOUS | Status: AC | PRN
  Administered 2017-01-18: 1000 mg via INTRAVENOUS

## 2017-01-18 MED ORDER — EPHEDRINE SULFATE 50 MG/ML IJ SOLN
INTRAMUSCULAR | Status: DC | PRN
  Administered 2017-01-18 (×2): 5 mg via INTRAVENOUS

## 2017-01-18 MED ORDER — DIPHENHYDRAMINE HCL 12.5 MG/5ML PO ELIX: 12.5000 mg | ORAL_SOLUTION | ORAL | Status: DC | PRN

## 2017-01-18 MED ORDER — BUPIVACAINE HCL (PF) 0.5 % IJ SOLN
INTRAMUSCULAR | Status: AC
  Filled 2017-01-18: qty 10

## 2017-01-18 MED ORDER — MIDAZOLAM HCL 2 MG/2ML IJ SOLN
INTRAMUSCULAR | Status: AC
  Filled 2017-01-18: qty 2

## 2017-01-18 MED ORDER — DOCUSATE SODIUM 100 MG PO CAPS
100.0000 mg | ORAL_CAPSULE | Freq: Two times a day (BID) | ORAL | Status: DC
Start: 2017-01-18 — End: 2017-01-20
  Administered 2017-01-18 – 2017-01-20 (×4): 100 mg via ORAL
  Filled 2017-01-18 (×4): qty 1

## 2017-01-18 MED ORDER — ASPIRIN EC 81 MG PO TBEC
81.0000 mg | DELAYED_RELEASE_TABLET | Freq: Every day | ORAL | Status: DC
  Administered 2017-01-19 – 2017-01-20 (×2): 81 mg via ORAL
  Filled 2017-01-18 (×2): qty 1

## 2017-01-18 MED ORDER — MIDAZOLAM HCL 5 MG/5ML IJ SOLN
INTRAMUSCULAR | Status: DC | PRN
  Administered 2017-01-18 (×2): 1 mg via INTRAVENOUS

## 2017-01-18 MED ORDER — BUPIVACAINE-EPINEPHRINE 0.25% -1:200000 IJ SOLN
INTRAMUSCULAR | Status: DC | PRN
  Administered 2017-01-18: 30 mL

## 2017-01-18 MED ORDER — CALCIUM CARBONATE-VITAMIN D 500-200 MG-UNIT PO TABS
1.0000 | ORAL_TABLET | Freq: Every day | ORAL | Status: DC
  Administered 2017-01-19 – 2017-01-20 (×2): 1 via ORAL
  Filled 2017-01-18 (×2): qty 1

## 2017-01-18 MED ORDER — MENTHOL 3 MG MT LOZG
1.0000 | LOZENGE | OROMUCOSAL | Status: DC | PRN
  Filled 2017-01-18: qty 9

## 2017-01-18 MED ORDER — PHENOL 1.4 % MT LIQD
1.0000 | OROMUCOSAL | Status: DC | PRN
  Filled 2017-01-18: qty 177

## 2017-01-18 MED ORDER — LATANOPROST 0.005 % OP SOLN
1.0000 [drp] | Freq: Every day | OPHTHALMIC | Status: DC
  Administered 2017-01-18 – 2017-01-19 (×2): 1 [drp] via OPHTHALMIC
  Filled 2017-01-18 (×2): qty 2.5

## 2017-01-18 MED ORDER — ATROPINE SULFATE 0.4 MG/ML IJ SOLN
INTRAMUSCULAR | Status: DC | PRN
  Administered 2017-01-18: .1 mg via INTRAVENOUS

## 2017-01-18 MED ORDER — GLYCOPYRROLATE 0.2 MG/ML IJ SOLN
INTRAMUSCULAR | Status: DC | PRN
  Administered 2017-01-18: 0.2 mg via INTRAVENOUS

## 2017-01-18 MED ORDER — ONDANSETRON HCL 4 MG/2ML IJ SOLN: 4.0000 mg | Freq: Four times a day (QID) | INTRAMUSCULAR | Status: DC | PRN

## 2017-01-18 MED ORDER — PROPOFOL 500 MG/50ML IV EMUL
INTRAVENOUS | Status: DC | PRN
  Administered 2017-01-18: 65 ug/kg/min via INTRAVENOUS

## 2017-01-18 MED ORDER — ENOXAPARIN SODIUM 40 MG/0.4ML ~~LOC~~ SOLN
40.0000 mg | SUBCUTANEOUS | Status: DC
  Administered 2017-01-19 – 2017-01-20 (×2): 40 mg via SUBCUTANEOUS
  Filled 2017-01-18 (×2): qty 0.4

## 2017-01-18 MED ORDER — METHOCARBAMOL 500 MG PO TABS: 500.0000 mg | ORAL_TABLET | Freq: Four times a day (QID) | ORAL | Status: DC | PRN

## 2017-01-18 MED ORDER — FENTANYL CITRATE (PF) 100 MCG/2ML IJ SOLN
INTRAMUSCULAR | Status: AC
  Filled 2017-01-18: qty 2

## 2017-01-18 MED ORDER — PROPOFOL 500 MG/50ML IV EMUL
INTRAVENOUS | Status: AC
  Filled 2017-01-18: qty 50

## 2017-01-18 MED ORDER — SODIUM CHLORIDE 0.9 % IV SOLN
INTRAVENOUS | Status: DC | PRN
  Administered 2017-01-18: 25 ug/min via INTRAVENOUS

## 2017-01-18 MED ORDER — ACETAMINOPHEN 650 MG RE SUPP: 650.0000 mg | Freq: Four times a day (QID) | RECTAL | Status: DC | PRN

## 2017-01-18 MED ORDER — SODIUM CHLORIDE 0.9 % IV SOLN
INTRAVENOUS | Status: DC
  Administered 2017-01-18: 16:00:00 via INTRAVENOUS

## 2017-01-18 MED ORDER — SERTRALINE HCL 50 MG PO TABS
100.0000 mg | ORAL_TABLET | Freq: Every day | ORAL | Status: DC
  Administered 2017-01-19 – 2017-01-20 (×2): 100 mg via ORAL
  Filled 2017-01-18 (×2): qty 2

## 2017-01-18 MED ORDER — NYSTATIN 100000 UNIT/GM EX CREA
1.0000 "application " | TOPICAL_CREAM | Freq: Two times a day (BID) | CUTANEOUS | Status: DC | PRN
  Filled 2017-01-18: qty 15

## 2017-01-18 MED ORDER — LIDOCAINE HCL (CARDIAC) 20 MG/ML IV SOLN
INTRAVENOUS | Status: DC | PRN
  Administered 2017-01-18: 60 mg via INTRAVENOUS

## 2017-01-18 MED ORDER — MAGNESIUM CITRATE PO SOLN
1.0000 | Freq: Once | ORAL | Status: DC | PRN
  Filled 2017-01-18: qty 296

## 2017-01-18 MED ORDER — CEFAZOLIN SODIUM-DEXTROSE 2-4 GM/100ML-% IV SOLN
2.0000 g | Freq: Four times a day (QID) | INTRAVENOUS | Status: AC
  Administered 2017-01-18 – 2017-01-19 (×3): 2 g via INTRAVENOUS
  Filled 2017-01-18 (×3): qty 100

## 2017-01-18 MED ORDER — TAMSULOSIN HCL 0.4 MG PO CAPS
0.4000 mg | ORAL_CAPSULE | Freq: Every day | ORAL | Status: DC
  Administered 2017-01-18 – 2017-01-20 (×3): 0.4 mg via ORAL
  Filled 2017-01-18 (×3): qty 1

## 2017-01-18 MED ORDER — MAGNESIUM HYDROXIDE 400 MG/5ML PO SUSP: 30.0000 mL | Freq: Every day | ORAL | Status: DC | PRN

## 2017-01-18 MED ORDER — METOCLOPRAMIDE HCL 10 MG PO TABS: 5.0000 mg | ORAL_TABLET | Freq: Three times a day (TID) | ORAL | Status: DC | PRN

## 2017-01-18 MED ORDER — VITAMIN D 1000 UNITS PO TABS
2000.0000 [IU] | ORAL_TABLET | Freq: Every day | ORAL | Status: DC
  Administered 2017-01-19 – 2017-01-20 (×2): 2000 [IU] via ORAL
  Filled 2017-01-18 (×2): qty 2

## 2017-01-18 MED ORDER — METOCLOPRAMIDE HCL 5 MG/ML IJ SOLN: 5.0000 mg | Freq: Three times a day (TID) | INTRAMUSCULAR | Status: DC | PRN

## 2017-01-18 MED ORDER — MORPHINE SULFATE (PF) 2 MG/ML IV SOLN: 2.0000 mg | INTRAVENOUS | Status: DC | PRN

## 2017-01-18 MED ORDER — ONDANSETRON HCL 4 MG/2ML IJ SOLN
INTRAMUSCULAR | Status: AC
  Filled 2017-01-18: qty 2

## 2017-01-18 MED ORDER — LIDOCAINE HCL (PF) 2 % IJ SOLN
INTRAMUSCULAR | Status: AC
  Filled 2017-01-18: qty 2

## 2017-01-18 MED ORDER — PHENYLEPHRINE HCL 10 MG/ML IJ SOLN
INTRAMUSCULAR | Status: DC | PRN
  Administered 2017-01-18 (×2): 100 ug via INTRAVENOUS

## 2017-01-18 SURGICAL SUPPLY — 52 items
BLADE SAW SAG 18.5X105 (BLADE) ×3 IMPLANT
BNDG COHESIVE 6X5 TAN STRL LF (GAUZE/BANDAGES/DRESSINGS) ×9 IMPLANT
CANISTER SUCT 1200ML W/VALVE (MISCELLANEOUS) ×3 IMPLANT
CAPT HIP TOTAL HALF DEPUY (Capitated) ×3 IMPLANT
CAPT HIP TOTAL HALF MEDACTA (Capitated) ×2 IMPLANT
CATH FOL LEG HOLDER (MISCELLANEOUS) ×3 IMPLANT
CATH TRAY METER 16FR LF (MISCELLANEOUS) ×3 IMPLANT
CHLORAPREP W/TINT 26ML (MISCELLANEOUS) ×3 IMPLANT
DRAPE C-ARM XRAY 36X54 (DRAPES) ×3 IMPLANT
DRAPE INCISE IOBAN 66X60 STRL (DRAPES) IMPLANT
DRAPE POUCH INSTRU U-SHP 10X18 (DRAPES) ×3 IMPLANT
DRAPE SHEET LG 3/4 BI-LAMINATE (DRAPES) ×9 IMPLANT
DRAPE TABLE BACK 80X90 (DRAPES) ×3 IMPLANT
DRESSING SURGICEL FIBRLLR 1X2 (HEMOSTASIS) ×3 IMPLANT
DRSG OPSITE POSTOP 4X8 (GAUZE/BANDAGES/DRESSINGS) ×6 IMPLANT
DRSG SURGICEL FIBRILLAR 1X2 (HEMOSTASIS) ×9
ELECT BLADE 6.5 EXT (BLADE) ×6 IMPLANT
ELECT REM PT RETURN 9FT ADLT (ELECTROSURGICAL) ×3
ELECTRODE REM PT RTRN 9FT ADLT (ELECTROSURGICAL) ×1 IMPLANT
EVACUATOR 1/8 PVC DRAIN (DRAIN) IMPLANT
GLOVE BIOGEL PI IND STRL 9 (GLOVE) ×1 IMPLANT
GLOVE BIOGEL PI INDICATOR 9 (GLOVE) ×2
GLOVE SURG SYN 9.0  PF PI (GLOVE) ×4
GLOVE SURG SYN 9.0 PF PI (GLOVE) ×2 IMPLANT
GOWN SRG 2XL LVL 4 RGLN SLV (GOWNS) ×1 IMPLANT
GOWN STRL NON-REIN 2XL LVL4 (GOWNS) ×2
GOWN STRL REUS W/ TWL LRG LVL3 (GOWN DISPOSABLE) ×1 IMPLANT
GOWN STRL REUS W/TWL LRG LVL3 (GOWN DISPOSABLE) ×2
HOOD PEEL AWAY FLYTE STAYCOOL (MISCELLANEOUS) ×3 IMPLANT
KIT PREVENA INCISION MGT 13 (CANNISTER) ×3 IMPLANT
MAT BLUE FLOOR 46X72 FLO (MISCELLANEOUS) ×3 IMPLANT
NDL SAFETY 18GX1.5 (NEEDLE) ×3 IMPLANT
NEEDLE SPNL 18GX3.5 QUINCKE PK (NEEDLE) ×3 IMPLANT
NS IRRIG 1000ML POUR BTL (IV SOLUTION) ×3 IMPLANT
PACK HIP COMPR (MISCELLANEOUS) ×3 IMPLANT
PENCIL ELECTRO HAND CTR (MISCELLANEOUS) ×3 IMPLANT
SOL PREP PVP 2OZ (MISCELLANEOUS) ×3
SOLUTION PREP PVP 2OZ (MISCELLANEOUS) ×1 IMPLANT
SPONGE DRAIN TRACH 4X4 STRL 2S (GAUZE/BANDAGES/DRESSINGS) IMPLANT
STAPLER SKIN PROX 35W (STAPLE) ×3 IMPLANT
STRAP SAFETY BODY (MISCELLANEOUS) ×3 IMPLANT
SUT DVC 2 QUILL PDO  T11 36X36 (SUTURE) ×2
SUT DVC 2 QUILL PDO T11 36X36 (SUTURE) ×1 IMPLANT
SUT SILK 0 (SUTURE) ×2
SUT SILK 0 30XBRD TIE 6 (SUTURE) ×1 IMPLANT
SUT V-LOC 90 ABS DVC 3-0 CL (SUTURE) ×3 IMPLANT
SUT VIC AB 1 CT1 36 (SUTURE) ×3 IMPLANT
SYR 20CC LL (SYRINGE) ×3 IMPLANT
SYR 30ML LL (SYRINGE) ×3 IMPLANT
TAPE MICROFOAM 4IN (TAPE) ×3 IMPLANT
TOWEL OR 17X26 4PK STRL BLUE (TOWEL DISPOSABLE) ×3 IMPLANT
WND VAC CANISTER 500ML (MISCELLANEOUS) IMPLANT

## 2017-01-18 NOTE — OR Nursing (Signed)
Lab tech in for abo/rh draw @ [phone]

## 2017-01-18 NOTE — Anesthesia Procedure Notes (Signed)
Spinal  Patient location during procedure: OR Start time: 01/18/2017 10:38 AM End time: 01/18/2017 10:52 AM Staffing Anesthesiologist: Randa Lynn AMY Resident/CRNA: Johnna Acosta Performed: resident/CRNA and anesthesiologist  Preanesthetic Checklist Completed: patient identified, site marked, surgical consent, pre-op evaluation, timeout performed, IV checked, risks and benefits discussed and monitors and equipment checked Spinal Block Patient position: sitting Prep: ChloraPrep Patient monitoring: heart rate, continuous pulse ox, blood pressure and cardiac monitor Approach: midline Location: L4-5 Injection technique: single-shot Needle Needle type: Introducer and Pencil-Tip  Needle gauge: 24 G Needle length: 9 cm Additional Notes Negative paresthesia. Negative blood return. Positive free-flowing CSF. Expiration date of kit checked and confirmed. Patient tolerated procedure well, without complications.

## 2017-01-18 NOTE — Progress Notes (Signed)
Patient is admitted to room 146 from surgery via room bed. Family at bedside. Wound vac, foley, TEDs, foot pumps, and NSL in place from surgery. A&O x4. Oriented to room, call light, TV and bed controls. Started clear liquids. POC started.

## 2017-01-18 NOTE — Op Note (Signed)
01/18/2017  12:46 PM  PATIENT:  William Armstrong  66 y.o. male  PRE-OPERATIVE DIAGNOSIS:  PRIMARY OSTEOARTHRITIS OF RIGHT HIP  POST-OPERATIVE DIAGNOSIS:  PRIMARY OSTEOARTHRITIS OF RIGHT HIP  PROCEDURE:  Procedure(s): TOTAL HIP ARTHROPLASTY ANTERIOR APPROACH (Right)  SURGEON: Laurene Footman, MD  ASSISTANTS: None  ANESTHESIA:   spinal  EBL:  Total I/O In: 1000 [I.V.:1000] Out: 450 [Urine:150; Blood:300]  BLOOD ADMINISTERED:none  DRAINS: none   LOCAL MEDICATIONS USED:  MARCAINE     SPECIMEN:  Source of Specimen:  Right femoral head  DISPOSITION OF SPECIMEN:  PATHOLOGY  COUNTS:  YES  TOURNIQUET:  * No tourniquets in log *  IMPLANTS: Medacta Mpact 56 mm DM cup and liner, Depuy Actis 9 high collared stem with +5 femoral head 28 mm metal  DICTATION: .Dragon Dictation   The patient was brought to the operating room and after spinal anesthesia was obtained patient was placed on the operative table with the ipsilateral foot into the Medacta attachment, contralateral leg on a well-padded table. C-arm was brought in and preop template x-ray taken. After prepping and draping in usual sterile fashion appropriate patient identification and timeout procedures were completed. Anterior approach to the hip was obtained and centered over the greater trochanter and TFL muscle. The subcutaneous tissue was incised hemostasis being achieved by electrocautery. TFL fascia was incised and the muscle retracted laterally deep retractor placed. The lateral femoral circumflex vessels were identified and ligated. The anterior capsule was exposed and a capsulotomy performed. The neck was identified and a femoral neck cut carried out with a saw. The head was removed without difficulty and showed sclerotic femoral head and acetabulum. Reaming was carried out to 54 mm and a 56 mm cup trial gave appropriate tightness to the acetabular component a 56 DM cup was impacted into position. The leg was then externally  rotated and ischiofemoral and pubofemoral releases carried out. The femur was sequentially broached to a size 9 Actis, size 9 with M head trials were placed and the final components chosen. The 9 lateralized stem was inserted along with a +5 metal 28 mm head and 56 DM mm liner. The hip was reduced and was stable the wound was thoroughly irrigated. Fibrillar was placed along the posterior capsule and medial neck cut for postop hemostasis along with topical TXA. The deep fascia was closed using a heavy Quill after infiltration of 30 cc of quarter percent Sensorcaine with epinephrine. 3-0 v-loc subcuticular followed by skin staples and a incisional wound VAC  PLAN OF CARE: Admit to inpatient

## 2017-01-18 NOTE — Transfer of Care (Signed)
Immediate Anesthesia Transfer of Care Note  Patient: William Armstrong  Procedure(s) Performed: Procedure(s): TOTAL HIP ARTHROPLASTY ANTERIOR APPROACH (Right)  Patient Location: PACU  Anesthesia Type:Spinal  Level of Consciousness: awake and alert   Airway & Oxygen Therapy: Patient Spontanous Breathing and Patient connected to face mask oxygen  Post-op Assessment: Report given to RN and Post -op Vital signs reviewed and stable  Post vital signs: Reviewed and stable  Last Vitals:  Vitals:   01/18/17 0737  BP: (!) 178/87  Pulse: 61  Resp: 18  Temp: 36.7 C  SpO2: 97%    Last Pain:  Vitals:   01/18/17 0737  TempSrc: Oral  PainSc: 7          Complications: No apparent anesthesia complications

## 2017-01-18 NOTE — Progress Notes (Signed)
RN assumed care at 1600. Patient alert and oriented. Complaining of moderate pain. RN administered pain medication. Vitals stable at this time. Patient oriented to room and call bell system.   Deri Fuelling, RN

## 2017-01-18 NOTE — Anesthesia Procedure Notes (Signed)
Date/Time: 01/18/2017 10:51 AM Performed by: Johnna Acosta Pre-anesthesia Checklist: Emergency Drugs available, Patient identified, Suction available, Patient being monitored and Timeout performed Patient Re-evaluated:Patient Re-evaluated prior to induction Oxygen Delivery Method: Simple face mask Preoxygenation: Pre-oxygenation with 100% oxygen

## 2017-01-18 NOTE — Anesthesia Post-op Follow-up Note (Signed)
Anesthesia QCDR form completed.        

## 2017-01-18 NOTE — Anesthesia Preprocedure Evaluation (Signed)
Anesthesia Evaluation  Patient identified by MRN, date of birth, ID band Patient awake    Reviewed: Allergy & Precautions, NPO status , Patient's Chart, lab work & pertinent test results  History of Anesthesia Complications Negative for: history of anesthetic complications  Airway Mallampati: II  TM Distance: >3 FB Neck ROM: Full    Dental no notable dental hx.    Pulmonary Sleep apnea: had repeat study after weight loss and no longer needs CPAP. , COPD,    breath sounds clear to auscultation- rhonchi (-) wheezing      Cardiovascular Exercise Tolerance: Good hypertension, + CAD, + Past MI, + Cardiac Stents (2013 RCA) and +CHF (EF 40%)   Rhythm:Regular Rate:Normal - Systolic murmurs and - Diastolic murmurs NM perfusion 03/18/15:  Myocardial perfusion imaging is Abnormal- mild moderate infarction  inferior minimal peri-infarct ischemia at the base..  Summed severity score is normal.  Artifacts noted:see above.  Overall left ventricular systolic function was Abnormal- EF 47% with  regional wall motion abnormalities (see above).  Compared to the prior study from no prior study.  Wall motion correlates with recent echo and know prior IMI and PCI to  RCA  Echo 03/18/15: Mildly concentrically hypertrophied and midly dilated left ventricle with focal hypokinesis of the inferior and inferior lateral walls. Other walls contract normally. EF mildly reduced at 40%. Mild diastolic dysfunction is present. Moderate left atrial enlargement. Thickened aortic valve with trivial stenosis. Mild regurgitation of all 4 heart valves. Mild pulmonary hypertension, with estimated systolic pressure of 89-38BOFB. Mildly dilated ascending aorta.   Neuro/Psych PSYCHIATRIC DISORDERS Depression negative neurological ROS     GI/Hepatic negative GI ROS, Neg liver ROS,   Endo/Other  negative endocrine ROSneg diabetes  Renal/GU negative Renal  ROS     Musculoskeletal  (+) Arthritis ,   Abdominal (+) + obese,   Peds  Hematology  (+) anemia ,   Anesthesia Other Findings Past Medical History: No date: Anemia No date: Arthritis No date: BCC (basal cell carcinoma of skin) No date: CHF (congestive heart failure) (HCC) No date: Complication of anesthesia     Comment:  neck hurt for 2-3 days after general anesthesia No date: COPD (chronic obstructive pulmonary disease) (HCC) No date: Coronary artery disease No date: Depression No date: Glaucoma No date: Heart attack [facility])     Comment:  2013 No date: History of kidney stones No date: HTN (hypertension) No date: Hyperglycemia     Comment:  pt denies No date: Hypertension No date: Hypokalemia No date: Obesity     Comment:  morbid No date: Sleep apnea     Comment:  osa, CPAP has been dc'd after 249 lb weight loss   Reproductive/Obstetrics                             Anesthesia Physical Anesthesia Plan  ASA: III  Anesthesia Plan: Spinal   Post-op Pain Management:    Induction:   PONV Risk Score and Plan: 1 and Ondansetron and Propofol infusion  Airway Management Planned: Natural Airway  Additional Equipment:   Intra-op Plan:   Post-operative Plan:   Informed Consent: I have reviewed the patients History and Physical, chart, labs and discussed the procedure including the risks, benefits and alternatives for the proposed anesthesia with the patient or authorized representative who has indicated his/her understanding and acceptance.   Dental advisory given  Plan Discussed with: Anesthesiologist and CRNA  Anesthesia Plan Comments:  Lab Results  Component Value Date   WBC 8.2 01/10/2017   HGB 12.0 (L) 01/10/2017   HCT 36.1 (L) 01/10/2017   MCV 85.6 01/10/2017   PLT 282 01/10/2017    Anesthesia Quick Evaluation

## 2017-01-18 NOTE — H&P (Signed)
Reviewed paper H+P, will be scanned into chart. No changes noted.  

## 2017-01-19 LAB — CBC
HEMATOCRIT: 31.7 % — AB (ref 40.0–52.0)
HEMOGLOBIN: 10.7 g/dL — AB (ref 13.0–18.0)
MCH: 29 pg (ref 26.0–34.0)
MCHC: 33.7 g/dL (ref 32.0–36.0)
MCV: 86.1 fL (ref 80.0–100.0)
Platelets: 188 10*3/uL (ref 150–440)
RBC: 3.69 MIL/uL — ABNORMAL LOW (ref 4.40–5.90)
RDW: 14.4 % (ref 11.5–14.5)
WBC: 10.1 10*3/uL (ref 3.8–10.6)

## 2017-01-19 LAB — BASIC METABOLIC PANEL
Anion gap: 5 (ref 5–15)
BUN: 11 mg/dL (ref 6–20)
CHLORIDE: 103 mmol/L (ref 101–111)
CO2: 28 mmol/L (ref 22–32)
CREATININE: 0.46 mg/dL — AB (ref 0.61–1.24)
Calcium: 8.2 mg/dL — ABNORMAL LOW (ref 8.9–10.3)
GFR calc non Af Amer: >60 mL/min (ref >60)
Glucose, Bld: 131 mg/dL — ABNORMAL HIGH (ref 65–99)
POTASSIUM: 3.8 mmol/L (ref 3.5–5.1)
Sodium: 136 mmol/L (ref 135–145)

## 2017-01-19 MED ORDER — FE FUMARATE-B12-VIT C-FA-IFC PO CAPS
1.0000 | ORAL_CAPSULE | Freq: Two times a day (BID) | ORAL | Status: DC
  Administered 2017-01-19 – 2017-01-20 (×2): 1 via ORAL
  Filled 2017-01-19 (×4): qty 1

## 2017-01-19 NOTE — Evaluation (Signed)
Physical Therapy Evaluation Patient Details Name: William Armstrong MRN: [id number] DOB: May 27, 1951 Today's Date: 01/19/2017   History of Present Illness  66 y/o male s/p R total hip replacement (anterior approach).    Clinical Impression  Pt showed very good effort with PT exam and overall was able to do all expected POD1 activities.  He was able to participate with 13 minutes of supine exercises apart from the exam and showed good responsiveness with gait training showing increased confidence, speed and cadence with cuing.  Pt with expected post-op pain, but was not limited functionally with this.  Pt eager to work with PT and do what he can to go home.     Follow Up Recommendations Home health PT    Equipment Recommendations       Recommendations for Other Services       Precautions / Restrictions Precautions Precautions: Fall;Anterior Hip Restrictions RLE Weight Bearing: Weight bearing as tolerated      Mobility  Bed Mobility Overal bed mobility: Modified Independent             General bed mobility comments: Pt with heavy use of rails/UEs, but able to scoot and get to sitting EOB w/o direct assist  Transfers Overall transfer level: Modified independent Equipment used: Rolling walker (2 wheeled)             General transfer comment: Pt was able to rise to standing w/o phyiscal assist, cuing for positioning and sequencing  Ambulation/Gait Ambulation/Gait assistance: Min guard Ambulation Distance (Feet): 65 Feet Assistive device: Rolling walker (2 wheeled)       General Gait Details: Pt initially slow, hesitant with ambulation but reports actually feeling better after more prolonged walking and by the end was able to bear weight well though the R LE and was able to keep the walker moving with consistent speed.  Pt fatigued but vitals normal after the effort.   Stairs            Wheelchair Mobility    Modified Rankin (Stroke Patients Only)        Balance Overall balance assessment: Modified Independent                                           Pertinent Vitals/Pain Pain Assessment: 0-10 Pain Score: 5  Pain Location: R thigh/incision area    Home Living Family/patient expects to be discharged to:: Private residence Living Arrangements: Spouse/significant other Available Help at Discharge: Family   Home Access: Ramped entrance     Home Layout: One level Home Equipment: Environmental consultant - 2 wheels;Wheelchair - power;Cane - single point      Prior Function Level of Independence: Independent with assistive device(s)         Comments: Pt would use w/c for longer (out of home) distance, but managed with cane in the home w/o assist     Hand Dominance        Extremity/Trunk Assessment   Upper Extremity Assessment Upper Extremity Assessment: Overall WFL for tasks assessed    Lower Extremity Assessment Lower Extremity Assessment: Overall WFL for tasks assessed (except expected post-op weakness Armstrong R, grossly 3+/5)       Communication   Communication: No difficulties  Cognition Arousal/Alertness: Awake/alert Behavior During Therapy: WFL for tasks assessed/performed Overall Cognitive Status: Within Functional Limits for tasks assessed  General Comments      Exercises Total Joint Exercises Ankle Circles/Pumps: AROM;Strengthening;10 reps Quad Sets: Strengthening;10 reps Gluteal Sets: Strengthening;10 reps Heel Slides: Strengthening;AROM;5 reps Hip ABduction/ADduction: Strengthening;10 reps Straight Leg Raises: AROM;AAROM;5 reps (Pt able to do 2 reps w/o assist) Knee Flexion: Strengthening;10 reps   Assessment/Plan    PT Assessment Patient needs continued PT services  PT Problem List Decreased strength;Decreased range of motion;Decreased activity tolerance;Decreased balance;Decreased mobility;Decreased knowledge of use of DME;Decreased safety  awareness;Decreased knowledge of precautions;Pain       PT Treatment Interventions DME instruction;Gait training;Functional mobility training;Therapeutic exercise;Therapeutic activities;Balance training;Patient/family education    PT Goals (Current goals can be found in the Care Plan section)  Acute Rehab PT Goals Patient Stated Goal: go home PT Goal Formulation: With patient Time For Goal Achievement: 02/02/17 Potential to Achieve Goals: Good    Frequency BID   Barriers to discharge        Co-evaluation               AM-PAC PT "6 Clicks" Daily Activity  Outcome Measure Difficulty turning over in bed (including adjusting bedclothes, sheets and blankets)?: A Little Difficulty moving from lying Armstrong back to sitting Armstrong the side of the bed? : A Little Difficulty sitting down Armstrong and standing up from a chair with arms (e.g., wheelchair, bedside commode, etc,.)?: A Little Help needed moving to and from a bed to chair (including a wheelchair)?: A Little Help needed walking in hospital room?: A Little Help needed climbing 3-5 steps with a railing? : A Lot 6 Click Score: 17    End of Session Equipment Utilized During Treatment: Gait belt Activity Tolerance: Patient tolerated treatment well;Patient limited by fatigue Patient left: with call bell/phone within reach;with nursing/sitter in room;in chair Nurse Communication: Mobility status PT Visit Diagnosis: Muscle weakness (generalized) (M62.81);Difficulty in walking, not elsewhere classified (R26.2)    Time: 5183-4373 PT Time Calculation (min) (ACUTE ONLY): 39 min   Charges:   PT Evaluation $PT Eval Low Complexity: 1 Low PT Treatments $Gait Training: 8-22 mins $Therapeutic Exercise: 8-22 mins   PT G Codes:   PT G-Codes **NOT FOR INPATIENT CLASS** Functional Assessment Tool Used: AM-PAC 6 Clicks Basic Mobility Functional Limitation: Mobility: Walking and moving around Mobility: Walking and Moving Around Current Status  (H7897): At least 40 percent but less than 60 percent impaired, limited or restricted Mobility: Walking and Moving Around Goal Status [phone]): At least 1 percent but less than 20 percent impaired, limited or restricted    Kreg Shropshire, DPT 01/19/2017, 10:10 AM

## 2017-01-19 NOTE — Care Management Note (Signed)
Case Management Note  Patient Details  Name: William Armstrong MRN: [id number] Date of Birth: Aug 30, 1950  Subjective/Objective:   POD # 1 right THA. Met with patient and his wife at bedside. Offered choice of home health agencies and wife [facility]. Referral sent to [facility] for HHPT. He has a walker. Will need a BSC. Ordered from [facility]. Pharmacy: [facility]- [phone]. Called Lovenox 40 mg # 14 no refills.                   Action/Plan: [facility] for [facility]. [facility] for [place].   Expected Discharge Date:                  Expected Discharge Plan:  [place]  In-House Referral:     Discharge planning Services  CM Consult  Post Acute Care Choice:  Durable Medical Equipment, Home Health Choice offered to:  Patient, Spouse  DME Arranged:  Bedside commode DME Agency:  [place]:  PT [place]:  [facility]  Status of Service:  In process, will continue to follow  If discussed at Long Length of Stay Meetings, dates discussed:    Additional Comments:  Jolly Mango, RN 01/19/2017, 11:46 AM

## 2017-01-19 NOTE — Progress Notes (Signed)
   Subjective: 1 Day Post-Op Procedure(s) (LRB): TOTAL HIP ARTHROPLASTY ANTERIOR APPROACH (Right) Patient reports pain as mild.   Patient is well, and has had no acute complaints or problems Denies any CP, SOB, ABD pain. We will start therapy today.  Plan is to go Home after hospital stay.  Objective: Vital signs in last 24 hours: Temp:  [97.3 F (36.3 C)-100.5 F (38.1 C)] 99.5 F (37.5 C) (08/17 0753) Pulse Rate:  [39-100] 92 (08/17 0753) Resp:  [11-21] 18 (08/17 0753) BP: (90-174)/(48-71) 118/50 (08/17 0753) SpO2:  [93 %-100 %] 96 % (08/17 0753)  Intake/Output from previous day: 08/16 0701 - 08/17 0700 In: 3695 [P.O.:420; I.V.:3075; IV Piggyback:200] Out: 6283 [Urine:1350; Blood:300] Intake/Output this shift: No intake/output data recorded.   Recent Labs  01/18/17 1523 01/19/17 0412  HGB 11.2* 10.7*    Recent Labs  01/18/17 1523 01/19/17 0412  WBC 13.3* 10.1  RBC 4.04* 3.69*  HCT 34.5* 31.7*  PLT 209 188    Recent Labs  01/18/17 1523 01/19/17 0412  NA  --  136  K  --  3.8  CL  --  103  CO2  --  28  BUN  --  11  CREATININE 0.45* 0.46*  GLUCOSE  --  131*  CALCIUM  --  8.2*   No results for input(s): LABPT, INR in the last 72 hours.  EXAM General - Patient is Alert, Appropriate and Oriented Extremity - Neurovascular intact Sensation intact distally Intact pulses distally Dorsiflexion/Plantar flexion intact No cellulitis present Compartment soft Dressing - dressing C/D/I and no drainage, wound vac intact Motor Function - intact, moving foot and toes well on exam.   Past Medical History:  Diagnosis Date  . Anemia   . Arthritis   . BCC (basal cell carcinoma of skin)   . CHF (congestive heart failure) ([place])   . Complication of anesthesia    neck hurt for 2-3 days after general anesthesia  . COPD (chronic obstructive pulmonary disease) ([place])   . Coronary artery disease   . Depression   . Glaucoma   . Heart attack ([place])    2013  .  History of kidney stones   . HTN (hypertension)   . Hyperglycemia    pt denies  . Hypertension   . Hypokalemia   . Obesity    morbid  . Sleep apnea    osa, CPAP has been dc'd after 249 lb weight loss    Assessment/Plan:   1 Day Post-Op Procedure(s) (LRB): TOTAL HIP ARTHROPLASTY ANTERIOR APPROACH (Right) Active Problems:   Primary localized osteoarthritis of right hip  Estimated body mass index is 37.59 kg/m as calculated from the following:   Height as of this encounter: 5\' 7"  (1.702 m).   Weight as of this encounter: 240 lb (108.9 kg). Advance diet Up with therapy  Needs BM Acute post op blood loss anemia-start Fe supplement Recheck labs in the am CM to assist with discharge D/C wound vac and apply honeycomb dressing 01/25/17  DVT Prophylaxis - Lovenox, Foot Pumps and TED hose Weight-Bearing as tolerated to right leg   T. Rachelle Hora, PA-C [place] 01/19/2017, 8:04 AM

## 2017-01-19 NOTE — Progress Notes (Signed)
Physical Therapy Treatment Patient Details Name: William Armstrong MRN: [id number] DOB: Jan 05, 1951 Today's Date: 01/19/2017    History of Present Illness 66 y/o male s/p R total hip replacement (anterior approach).      PT Comments    Pt did very well with PT session this afternoon, he was able to do all exercises on R with some resistance, did not have any increased pain with activity and walked >100 ft with consistent cadence.  Pt making good and appropriate gains, eager to go home tomorrow if possible.   Follow Up Recommendations  Home health PT     Equipment Recommendations       Recommendations for Other Services       Precautions / Restrictions Precautions Precautions: Fall;Anterior Hip Precaution Booklet Issued: Yes (comment) Restrictions Weight Bearing Restrictions: Yes RLE Weight Bearing: Weight bearing as tolerated    Mobility  Bed Mobility Overal bed mobility: Independent             General bed mobility comments: Pt able to get up to EOB with minimal rail use, good efficiency and confidence  Transfers Overall transfer level: Independent Equipment used: Rolling walker (2 wheeled)             General transfer comment: Pt remebered hand placement w/o cuing and was able to rise w/o issue  Ambulation/Gait Ambulation/Gait assistance: Modified independent (Device/Increase time) Ambulation Distance (Feet): 110 Feet Assistive device: Rolling walker (2 wheeled)       General Gait Details: Pt with greatly increased confidence, speed and consistency with ambulation. Pt able to maintain equal cadence b/l and constant forward motion with walker and no R LE hesitancy.  Pt's vitals stable t/o the effort, only minimal c/o fatigue post ambulation.    Stairs            Wheelchair Mobility    Modified Rankin (Stroke Patients Only)       Balance Overall balance assessment: Modified Independent                                          Cognition Arousal/Alertness: Awake/alert Behavior During Therapy: WFL for tasks assessed/performed Overall Cognitive Status: Within Functional Limits for tasks assessed                                        Exercises Total Joint Exercises Ankle Circles/Pumps: Strengthening;10 reps Quad Sets: Strengthening;10 reps Gluteal Sets: Strengthening;10 reps Short Arc Quad: Strengthening;10 reps Heel Slides: Strengthening;10 reps Hip ABduction/ADduction: Strengthening;10 reps Straight Leg Raises: AROM;10 reps (Pt able to "easily" do 10 SLRs with minimal warm up) Knee Flexion: Strengthening;10 reps    General Comments        Pertinent Vitals/Pain Pain Score: 3  ("It's really just more sore than anything.")    Home Living                      Prior Function            PT Goals (current goals can now be found in the care plan section) Progress towards PT goals: Progressing toward goals    Frequency    BID      PT Plan Current plan remains appropriate    Co-evaluation  AM-PAC PT "6 Clicks" Daily Activity  Outcome Measure  Difficulty turning over in bed (including adjusting bedclothes, sheets and blankets)?: None Difficulty moving from lying on back to sitting on the side of the bed? : None Difficulty sitting down on and standing up from a chair with arms (e.g., wheelchair, bedside commode, etc,.)?: None Help needed moving to and from a bed to chair (including a wheelchair)?: None Help needed walking in hospital room?: None Help needed climbing 3-5 steps with a railing? : A Little 6 Click Score: 23    End of Session Equipment Utilized During Treatment: Gait belt Activity Tolerance: Patient tolerated treatment well Patient left: with call bell/phone within reach;with chair alarm set Nurse Communication: Mobility status PT Visit Diagnosis: Muscle weakness (generalized) (M62.81);Difficulty in walking, not elsewhere classified  (R26.2)     Time: 2993-7169 PT Time Calculation (min) (ACUTE ONLY): 26 min  Charges:  $Gait Training: 8-22 mins $Therapeutic Exercise: 8-22 mins                    G Codes:       Kreg Shropshire, DPT 01/19/2017, 2:13 PM

## 2017-01-19 NOTE — Care Management Important Message (Signed)
Important Message  Patient Details  Name: William Armstrong MRN: [id number] Date of Birth: [birth year]   Medicare Important Message Given:  Yes    Jolly Mango, RN 01/19/2017, 10:47 AM

## 2017-01-19 NOTE — Anesthesia Postprocedure Evaluation (Signed)
Anesthesia Post Note  Patient: William Armstrong  Procedure(s) Performed: Procedure(s) (LRB): TOTAL HIP ARTHROPLASTY ANTERIOR APPROACH (Right)  Patient location during evaluation: Nursing Unit Anesthesia Type: Spinal Level of consciousness: awake, awake and alert and oriented Pain management: pain level controlled Vital Signs Assessment: post-procedure vital signs reviewed and stable Respiratory status: spontaneous breathing, nonlabored ventilation and respiratory function stable Cardiovascular status: stable Anesthetic complications: no Comments: Site is clean, dry, intact.      Last Vitals:  Vitals:   01/18/17 2304 01/19/17 0448  BP: (!) 145/51 (!) 105/57  Pulse: 75 100  Resp: 18 19  Temp: 36.6 C (!) 38.1 C  SpO2: 96% 93%    Last Pain:  Vitals:   01/19/17 0459  TempSrc:   PainSc: [place]

## 2017-01-19 NOTE — Progress Notes (Signed)
Pt alert and oriented. Pt medicated through the night with good results. Iv infusing without difficulty. Eating and drinking without difficulty. Pt dressing remaining dry and intact. [place] wound vac in place.

## 2017-01-19 NOTE — Clinical Social Work Note (Signed)
CSW received referral for SNF.  Case discussed with case manager and plan is to discharge home with home health.  CSW to sign off please re-consult if social work needs arise.  Dayja Loveridge R. Perez Dirico, MSW, LCSWA [phone]  

## 2017-01-20 LAB — CBC
HCT: 30.1 % — ABNORMAL LOW (ref 40.0–52.0)
Hemoglobin: 10 g/dL — ABNORMAL LOW (ref 13.0–18.0)
MCH: 28.8 pg (ref 26.0–34.0)
MCHC: 33.4 g/dL (ref 32.0–36.0)
MCV: 86.4 fL (ref 80.0–100.0)
PLATELETS: 180 10*3/uL (ref 150–440)
RBC: 3.48 MIL/uL — ABNORMAL LOW (ref 4.40–5.90)
RDW: 14.7 % — AB (ref 11.5–14.5)
WBC: 10.5 10*3/uL (ref 3.8–10.6)

## 2017-01-20 LAB — BASIC METABOLIC PANEL
ANION GAP: 5 (ref 5–15)
BUN: 11 mg/dL (ref 6–20)
CALCIUM: 8.4 mg/dL — AB (ref 8.9–10.3)
CO2: 29 mmol/L (ref 22–32)
Chloride: 104 mmol/L (ref 101–111)
Creatinine, Ser: 0.52 mg/dL — ABNORMAL LOW (ref 0.61–1.24)
GFR calc Af Amer: >60 mL/min (ref >60)
GLUCOSE: 114 mg/dL — AB (ref 65–99)
POTASSIUM: 3.9 mmol/L (ref 3.5–5.1)
SODIUM: 138 mmol/L (ref 135–145)

## 2017-01-20 MED ORDER — ENOXAPARIN SODIUM 40 MG/0.4ML ~~LOC~~ SOLN: 40.0000 mg | SUBCUTANEOUS | 0 refills | Status: DC

## 2017-01-20 MED ORDER — OXYCODONE HCL 5 MG PO TABS: 5.0000 mg | ORAL_TABLET | ORAL | 0 refills | Status: DC | PRN

## 2017-01-20 NOTE — Progress Notes (Signed)
Patient is being discharged to home with [facility]. Discharged and RX instructions given and patient acknowledged understanding. No Rx for pain medicine was given (not in chart). This nurse will call Dr. Sabra Heck who is on call this weekend and speak with him about. Patient said he can't go home without. IV removed. Wife packed belongings. She will take patient home once Rx situation is settled.

## 2017-01-20 NOTE — Discharge Instructions (Signed)
Patient Name Sex DOB SSN  POWELL HALBERT  @GENDER @ 05-06-1951 [id number]    Discharge Planning by Watt Climes.   Author: Watt Climes Service: Orthopedics Author Type: Physician Assistant    Filed: @T @ Note Time: 6:05 AM Status: Signed   Editor: Watt Climes., PA     Expand All Collapse All   ANTERIOR APPROACH TOTAL HIP REPLACEMENT POSTOPERATIVE DIRECTIONS   Hip Rehabilitation, Guidelines Following Surgery  The results of a hip operation are greatly improved after range of motion and muscle strengthening exercises. Follow all safety measures which are given to protect your hip. If any of these exercises cause increased pain or swelling in your joint, decrease the amount until you are comfortable again. Then slowly increase the exercises. Call your caregiver if you have problems or questions.   HOME CARE INSTRUCTIONS   Remove items at home which could result in a fall. This includes throw rugs or furniture in walking pathways.   ICE to the affected hip every three hours for 30 minutes at a time and then as needed for pain and swelling. Continue to use ice on the hip for pain and swelling from surgery. You may notice swelling that will progress down to the foot and ankle. This is normal after surgery. Elevate the leg when you are not up walking on it.   Continue to use the breathing machine which will help keep your temperature down. It is common for your temperature to cycle up and down following surgery, especially at night when you are not up moving around and exerting yourself. The breathing machine keeps your lungs expanded and your temperature down.  Do not place pillow under knee, focus on keeping the knee straight while resting  DIET You may resume your previous home diet once your are discharged from the hospital.  DRESSING / WOUND CARE / SHOWERING Change the surgical dressing as needed If the wound gets wet inside, change the dressing with sterile gauze. Please  use good hand washing techniques before changing the dressing. Do not use any lotions or creams on the incision until instructed by your surgeon.Keep your dressing dry with showering.  Keep your dressing dry with showering. You can keep it covered and pat dry.  STAPLE REMOVAL: Please remove staples two weeks post op and apply benzoin and 1/2 inch steri strips  ACTIVITY Walk with your walker as instructed. Use walker as long as suggested by your caregivers. Avoid periods of inactivity such as sitting longer than an hour when not asleep. This helps prevent blood clots.  You may resume a sexual relationship in one month or when given the OK by your doctor.  You may return to work once you are cleared by your doctor.  Do not drive a car for 6 weeks or until released by you surgeon.  Do not drive while taking narcotics.  WEIGHT BEARING Weight bearing as tolerated with assist device (walker, cane, etc) as directed, use it as long as suggested by your surgeon or therapist, typically at least 4-6 weeks.  POSTOPERATIVE CONSTIPATION PROTOCOL Constipation - defined medically as fewer than three stools per week and severe constipation as less than one stool per week.  One of the most common issues patients have following surgery is constipation. Even if you have a regular bowel pattern at home, your normal regimen is likely to be disrupted due to multiple reasons following surgery. Combination of anesthesia, postoperative narcotics, change in appetite and fluid intake all can affect your bowels.  In order to avoid complications following surgery, here are some recommendations in order to help you during your recovery period.  Colace (docusate) - Pick up an over-the-counter form of Colace or another stool softener and take twice a day as long as you are requiring postoperative pain medications. Take with a full glass of water daily. If you experience loose stools or diarrhea, hold the colace  until you stool forms back up. If your symptoms do not get better within 1 week or if they get worse, check with your doctor.  Dulcolax (bisacodyl) - Pick up over-the-counter and take as directed by the product packaging as needed to assist with the movement of your bowels. Take with a full glass of water. Use this product as needed if not relieved by Colace only.   MiraLax (polyethylene glycol) - Pick up over-the-counter to have on hand. MiraLax is a solution that will increase the amount of water in your bowels to assist with bowel movements. Take as directed and can mix with a glass of water, juice, soda, coffee, or tea. Take if you go more than two days without a movement. Do not use MiraLax more than once per day. Call your doctor if you are still constipated or irregular after using this medication for 7 days in a row.  If you continue to have problems with postoperative constipation, please contact the office for further assistance and recommendations. If you experience "the worst abdominal pain ever" or develop nausea or vomiting, please contact the office immediatly for further recommendations for treatment.  ITCHING If you experience itching with your medications, try taking only a single pain pill, or even half a pain pill at a time. You can also use Benadryl over the counter for itching or also to help with sleep.   TED HOSE STOCKINGS Wear the elastic stockings on both legs for 6 weeks following surgery during the day but you may remove then at night for sleeping.  MEDICATIONS See your medication summary on the After Visit Summary that the nursing staff will review with you prior to discharge. You may have some home medications which will be placed on hold until you complete the course of blood thinner medication. It is important for you to complete the blood thinner medication as prescribed by your surgeon. Continue your approved medications as instructed at time of  discharge.  PRECAUTIONS If you experience chest pain or shortness of breath - call 911 immediately for transfer to the hospital emergency department.  If you develop a fever greater that 101 F, purulent drainage from wound, increased redness or drainage from wound, foul odor from the wound/dressing, or calf pain - CONTACT YOUR SURGEON.    FOLLOW-UP APPOINTMENTS Make sure you keep all of your appointments after your operation with your surgeon and caregivers. You should call the office at the above phone number and make an appointment for approximately two weeks after the date of your surgery or on the date instructed by your surgeon outlined in the "After Visit Summary".  RANGE OF MOTION AND STRENGTHENING EXERCISES  These exercises are designed to help you keep full movement of your hip joint. Follow your caregiver's or physical therapist's instructions. Perform all exercises about fifteen times, three times per day or as directed. Exercise both hips, even if you have had only one joint replacement. These exercises can be done on a training (exercise) mat, on the floor, on a table or on a bed. Use whatever works the best and is  most comfortable for you. Use music or television while you are exercising so that the exercises are a pleasant break in your day. This will make your life better with the exercises acting as a break in routine you can look forward to.   Lying on your back, slowly slide your foot toward your buttocks, raising your knee up off the floor. Then slowly slide your foot back down until your leg is straight again.   Lying on your back spread your legs as far apart as you can without causing discomfort.   Lying on your side, raise your upper leg and foot straight up from the floor as far as is comfortable. Slowly lower the leg and repeat.   Lying on your back, tighten up the muscle in the front of your thigh (quadriceps muscles).  You can do this by keeping your leg straight and trying to raise your heel off the floor. This helps strengthen the largest muscle supporting your knee.   Lying on your back, tighten up the muscles of your buttocks both with the legs straight and with the knee bent at a comfortable angle while keeping your heel on the floor.  IF YOU ARE TRANSFERRED TO A SKILLED REHAB FACILITY If the patient is transferred to a skilled rehab facility following release from the hospital, a list of the current medications will be sent to the facility for the patient to continue. When discharged from the skilled rehab facility, please have the facility set up the patient's [place] prior to being released. Also, the skilled facility will be responsible for providing the patient with their medications at time of release from the facility to include their pain medication, the muscle relaxants, and their blood thinner medication. If the patient is still at the rehab facility at time of the two week follow up appointment, the skilled rehab facility will also need to assist the patient in arranging follow up appointment in our office and any transportation needs.  MAKE SURE YOU:   Understand these instructions.   Get help right away if you are not doing well or get worse.   Pick up stool softner and laxative for home use following surgery while on pain medications. Do not submerge incision under water. Please use good hand washing techniques while changing dressing each day. May shower starting three days after surgery. Please use a clean towel to pat the incision dry following showers. Continue to use ice for pain and swelling after surgery. Do not use any lotions or creams on the incision until instructed by your surgeon.

## 2017-01-20 NOTE — Discharge Summary (Signed)
Physician Discharge Summary  Patient ID: William Armstrong MRN: [id number] DOB/AGE: 12/01/1950 66 y.o.  Admit date: 01/18/2017 Discharge date: 01/20/2017  Admission Diagnoses:  PRIMARY OSTEOARTHRITIS OF RIGHT HIP   Discharge Diagnoses: Patient Active Problem List   Diagnosis Date Noted  . Primary localized osteoarthritis of right hip 01/18/2017  . Hematuria 11/18/2016  . Chronic respiratory failure with hypoxia ([place]) 01/06/2015  . Chronic bronchitis ([place]) 11/30/2014  . COPD, moderate ([place]) 11/30/2014  . H/O: depression 11/30/2014  . Old myocardial infarction 11/30/2014  . HLD (hyperlipidemia) 11/30/2014  . Low back pain 11/30/2014  . Extreme obesity 11/30/2014  . Arthritis of knee, degenerative 11/30/2014  . Back pain, thoracic 11/30/2014  . Dermatophytosis of groin 11/30/2014  . Adiposity 07/08/2014  . Bariatric surgery status 07/08/2014  . Morbid obesity ([place]) 08/21/2013  . Glaucoma 03/14/2012  . Arthritis, degenerative 03/14/2012  . Ventricular fibrillation ([place]) 03/14/2012  . Arteriosclerosis of coronary artery 02/26/2012  . CAFL (chronic airflow limitation) ([place]) 02/26/2012  . BP (high blood pressure) 02/26/2012  . Chronic obstructive pulmonary disease ([place]) 02/26/2012  . Essential (primary) hypertension 07/09/2006  . Open-angle glaucoma 07/09/2006  . Hypoxemia 07/09/2006  . Obstructive apnea 07/09/2006    Past Medical History:  Diagnosis Date  . Anemia   . Arthritis   . BCC (basal cell carcinoma of skin)   . CHF (congestive heart failure) ([place])   . Complication of anesthesia    neck hurt for 2-3 days after general anesthesia  . COPD (chronic obstructive pulmonary disease) ([place])   . Coronary artery disease   . Depression   . Glaucoma   . Heart attack ([place])    2013  . History of kidney stones   . HTN (hypertension)   . Hyperglycemia    pt denies  . Hypertension   . Hypokalemia   . Obesity    morbid  . Sleep apnea    osa, CPAP has been dc'd after 249  lb weight loss     Transfusion: No transfusions on this admission   Consultants (if any):   Discharged Condition: Improved  Hospital Course: William Armstrong is an 66 y.o. male who was admitted 01/18/2017 with a diagnosis of degenerative arthrosis right hip and went to the operating room on 01/18/2017 and underwent the above named procedures.    Surgeries:Procedure(s): TOTAL HIP ARTHROPLASTY ANTERIOR APPROACH on 01/18/2017  PRE-OPERATIVE DIAGNOSIS:  PRIMARY OSTEOARTHRITIS OF RIGHT HIP  POST-OPERATIVE DIAGNOSIS:  PRIMARY OSTEOARTHRITIS OF RIGHT HIP  PROCEDURE:  Procedure(s): TOTAL HIP ARTHROPLASTY ANTERIOR APPROACH (Right)  SURGEON: Laurene Footman, MD  ASSISTANTS: None  ANESTHESIA:   spinal  EBL:  Total I/O In: 1000 [I.V.:1000] Out: 450 [Urine:150; Blood:300]  BLOOD ADMINISTERED:none  DRAINS: none   LOCAL MEDICATIONS USED:  MARCAINE     SPECIMEN:  Source of Specimen:  Right femoral head  DISPOSITION OF SPECIMEN:  PATHOLOGY  COUNTS:  YES  TOURNIQUET:  * No tourniquets in log *  IMPLANTS: Medacta Mpact 56 mm DM cup and liner, Depuy Actis 9 high collared stem with +5 femoral head 28 mm metal  Patient tolerated the surgery well. No complications .Patient was taken to PACU where she was stabilized and then transferred to the orthopedic floor.  Patient started on Lovenox 30 mg q 12 hrs. Foot pumps applied bilaterally at 80 mm hgb. Heels elevated off bed with rolled towels. No evidence of DVT. Calves non tender. Negative [place]. Physical therapy started on day #1 for gait training and transfer with  OT starting on  day #1 for ADL and assisted devices. Patient has done well with therapy. Ambulated greater than 200 feet upon being discharged.  Patient's IV And Foley were discontinued on day #1 with Hemovac being discontinued on day #2. Dressing was changed on day 2 prior to patient being discharged   He was given perioperative antibiotics:  Anti-infectives     Start     Dose/Rate Route Frequency Ordered Stop   01/18/17 1800  ceFAZolin (ANCEF) IVPB 2g/100 mL premix     2 g 200 mL/hr over 30 Minutes Intravenous Every 6 hours 01/18/17 1509 01/19/17 0557   01/17/17 2145  ceFAZolin (ANCEF) IVPB 2g/100 mL premix     2 g 200 mL/hr over 30 Minutes Intravenous  Once 01/17/17 2133 01/18/17 1120    .  He was fitted with AV 1 compression foot pump devices, instructed on heel pumps, early ambulation, and fitted with TED stockings bilaterally for DVT prophylaxis.  He benefited maximally from the hospital stay and there were no complications.    Recent vital signs:  Vitals:   01/19/17 2020 01/20/17 0501  BP: (!) 123/49 (!) 111/43  Pulse: (!) 55 69  Resp: 18 20  Temp: 98 F (36.7 C) 99.9 F (37.7 C)  SpO2: 95% 93%    Recent laboratory studies:  Lab Results  Component Value Date   HGB 10.0 (L) 01/20/2017   HGB 10.7 (L) 01/19/2017   HGB 11.2 (L) 01/18/2017   Lab Results  Component Value Date   WBC 10.5 01/20/2017   PLT 180 01/20/2017   Lab Results  Component Value Date   INR 1.02 01/10/2017   Lab Results  Component Value Date   NA 138 01/20/2017   K 3.9 01/20/2017   CL 104 01/20/2017   CO2 29 01/20/2017   BUN 11 01/20/2017   CREATININE 0.52 (L) 01/20/2017   GLUCOSE 114 (H) 01/20/2017    Discharge Medications:   Allergies as of 01/20/2017      Reactions   Atorvastatin Other (See Comments)   Muscle aches.   Pregabalin Other (See Comments)   Muscle soreness      Medication List    STOP taking these medications   Aspirin 81 MG EC tablet     TAKE these medications   calcium-vitamin D 500-200 MG-UNIT tablet Take 1 tablet by mouth daily.   CRESTOR 10 MG tablet Generic drug:  rosuvastatin Take 10 mg by mouth every other day.   dorzolamide 2 % ophthalmic solution Commonly known as:  TRUSOPT Place 1 drop into both eyes 3 (three) times daily.   econazole nitrate 1 % cream Apply 1 application topically 2 (two) times daily  as needed (for rash).   enoxaparin 40 MG/0.4ML injection Commonly known as:  LOVENOX Inject 0.4 mLs (40 mg total) into the skin daily.   HYDROcodone-acetaminophen 5-325 MG tablet Commonly known as:  NORCO/VICODIN Take 1-2 tablets by mouth every 6 (six) hours as needed for moderate pain. What changed:  reasons to take this   latanoprost 0.005 % ophthalmic solution Commonly known as:  XALATAN Place 1 drop into both eyes at bedtime.   lisinopril 40 MG tablet Commonly known as:  PRINIVIL,ZESTRIL Take 40 mg by mouth daily.   multivitamin with minerals Tabs tablet Take 1 tablet by mouth daily.   neomycin-bacitracin-polymyxin ointment Commonly known as:  NEOSPORIN Apply 1 application topically 3 (three) times daily as needed for wound care.   nystatin cream Commonly known as:  MYCOSTATIN  Apply 1 application topically 2 (two) times daily as needed (for rash).   oxybutynin 5 MG tablet Commonly known as:  DITROPAN Take 1 tablet (5 mg total) by mouth every 8 (eight) hours as needed for bladder spasms.   oxyCODONE 5 MG immediate release tablet Commonly known as:  Oxy IR/ROXICODONE Take 1 tablet (5 mg total) by mouth every 6 (six) hours as needed for moderate pain.   sertraline 100 MG tablet Commonly known as:  ZOLOFT Take 1 tablet (100 mg total) by mouth daily.   sildenafil 20 MG tablet Commonly known as:  REVATIO Take 100 mg by mouth daily as needed (for erectile dysfunction).   tamsulosin 0.4 MG Caps capsule Commonly known as:  FLOMAX Take 1 capsule (0.4 mg total) by mouth daily.   traMADol 50 MG tablet Commonly known as:  ULTRAM Take 50 mg by mouth every 6 (six) hours as needed for moderate pain.   Vitamin D 2000 units Caps Take 2,000 Units by mouth daily.   zinc gluconate 50 MG tablet Take 50 mg by mouth daily.            Durable Medical Equipment        Start     Ordered   01/18/17 1510  DME Walker rolling  Once    Question:  Patient needs a walker to  treat with the following condition  Answer:  Status post total hip replacement, right   01/18/17 1509   01/18/17 1510  DME 3 n 1  Once     01/18/17 1509   01/18/17 1510  DME Bedside commode  Once    Question:  Patient needs a bedside commode to treat with the following condition  Answer:  Status post total hip replacement, right   01/18/17 1509      Diagnostic Studies: US Renal  Result Date: 12/26/2016 CLINICAL DATA:  History of lithotripsy for left-sided ureteral stone on November 29, 2016 EXAM: RENAL / URINARY TRACT ULTRASOUND COMPLETE COMPARISON:  CT scan of the abdomen and pelvis of November 18, 2016. FINDINGS: Right Kidney: Length: 14 cm. Multiple kidney stones are observed with the largest lying in the midpole measuring 10 mm in diameter. There is an upper pole cyst measuring 3.1 x 2.4 x 1.7 cm. There is a lower pole cyst measuring 2.3 x 1.5 x 2 cm. The renal cortical echotexture remains lower than that of the liver. Left Kidney: Length: 13.4 cm. There is a lower pole stone measuring 9 mm in greatest dimension. There is an upper pole cyst measuring 1.2 x 1.1 x 1.1 cm. There is no hydronephrosis. The renal cortical echotexture is similar to that on the right. Bladder: The urinary bladder is only partially distended. IMPRESSION: There is no hydronephrosis. There bilateral kidney stones and simple appearing cysts. Electronically Signed   By: David  Martinique M.D.   On: 12/26/2016 11:33   Dg Knee Complete 4 Views Right  Result Date: 12/22/2016 CLINICAL DATA:  Arthritis.  Knee injections.  Pain. EXAM: RIGHT KNEE - COMPLETE 4+ VIEW COMPARISON:  No recent prior. FINDINGS: Diffuse osteopenia. Mild degenerative change medial and patellofemoral compartments. No acute bony or joint abnormality identified. No evidence of fracture or dislocation. Peripheral vascular calcification. IMPRESSION: 1. No acute bony abnormality. Diffuse osteopenia. Degenerative change medial and patellofemoral compartments. 2. Peripheral  vascular disease . Electronically Signed   By: Marcello Moores  Register   On: 12/22/2016 15:56   Dg Hip Operative Unilat W Or W/o Pelvis Right  Result Date:  01/18/2017 CLINICAL DATA:  Right hip replacement EXAM: OPERATIVE RIGHT HIP WITH PELVIS COMPARISON:  None. FLUOROSCOPY TIME:  Radiation Exposure Index (as provided by the fluoroscopic device): 3.6 mGy If the device does not provide the exposure index: Fluoroscopy Time:  18 seconds Number of Acquired Images:  4 FINDINGS: Right hip replacement is noted in satisfactory position. No acute soft tissue or bony abnormality is seen. IMPRESSION: Status post right hip replacement Electronically Signed   By: Inez Catalina M.D.   On: 01/18/2017 12:40   Dg Hip Unilat W Or W/o Pelvis 2-3 Views Right  Result Date: 01/18/2017 CLINICAL DATA:  Right hip replacement. EXAM: DG HIP (WITH OR WITHOUT PELVIS) 2-3V RIGHT COMPARISON:  11/18/2016 . FINDINGS: Right hip replacement with anatomic alignment. Hardware intact. No acute bony abnormality . IMPRESSION: Right hip replacement with anatomic alignment. Electronically Signed   By: Marcello Moores  Register   On: 01/18/2017 13:20    Disposition: 01-Home or Self Care  Discharge Instructions    Diet - low sodium heart healthy    Complete by:  As directed    Increase activity slowly    Complete by:  As directed          Signed: Khyre Germond R. 01/20/2017, 6:06 AM

## 2017-01-20 NOTE — Progress Notes (Signed)
Physical Therapy Treatment Patient Details Name: William Armstrong MRN: [id number] DOB: 10-23-1950 Today's Date: 01/20/2017    History of Present Illness 66 y/o male s/p R total hip replacement (anterior approach).      PT Comments    Participated in exercises as described below.  Pt able to complete full lap around nursing unit and complete stair training with min guard.  No LOB or buckling noted.  He did require some cues for safety and to decrease speed this session. Reports no further questions for therapy.   Follow Up Recommendations  Home health PT     Equipment Recommendations       Recommendations for Other Services       Precautions / Restrictions Precautions Precautions: Fall;Anterior Hip Restrictions Weight Bearing Restrictions: Yes RLE Weight Bearing: Weight bearing as tolerated    Mobility  Bed Mobility Overal bed mobility: Independent             General bed mobility comments: Pt able to get up to EOB with minimal rail use, good efficiency and confidence  Transfers Overall transfer level: Independent Equipment used: Rolling walker (2 wheeled)             General transfer comment: Did need some verbal cues for hand placements today "I know"  Ambulation/Gait Ambulation/Gait assistance: Min guard Ambulation Distance (Feet): 200 Feet Assistive device: Rolling walker (2 wheeled) Gait Pattern/deviations: Step-through pattern   Gait velocity interpretation: Below normal speed for age/gender General Gait Details: Verbal cues to correct overall gait quailty, speed and walker use.  Pt with quick movements at times with education for safety.   Stairs    Up/down 4 steps with bilateral rails and min guard.  Stated he has a ramp at home which he primarily uses.           Wheelchair Mobility    Modified Rankin (Stroke Patients Only)       Balance Overall balance assessment: Modified Independent                                           Cognition Arousal/Alertness: Awake/alert Behavior During Therapy: WFL for tasks assessed/performed Overall Cognitive Status: Within Functional Limits for tasks assessed                                        Exercises Total Joint Exercises Ankle Circles/Pumps: Strengthening;10 reps Quad Sets: Strengthening;10 reps Gluteal Sets: Strengthening;10 reps Short Arc Quad: Strengthening;10 reps Heel Slides: Strengthening;10 reps Hip ABduction/ADduction: Strengthening;10 reps Straight Leg Raises: AROM;10 reps    General Comments        Pertinent Vitals/Pain Pain Assessment: 0-10 Pain Score: 2  Pain Location: R thigh/incision area Pain Descriptors / Indicators: Sore Pain Intervention(s): Limited activity within patient's tolerance;Monitored during session    Home Living                      Prior Function            PT Goals (current goals can now be found in the care plan section) Progress towards PT goals: Progressing toward goals    Frequency    BID      PT Plan Current plan remains appropriate    Co-evaluation  AM-PAC PT "6 Clicks" Daily Activity  Outcome Measure  Difficulty turning over in bed (including adjusting bedclothes, sheets and blankets)?: None Difficulty moving from lying on back to sitting on the side of the bed? : None Difficulty sitting down on and standing up from a chair with arms (e.g., wheelchair, bedside commode, etc,.)?: None Help needed moving to and from a bed to chair (including a wheelchair)?: None Help needed walking in hospital room?: A Little Help needed climbing 3-5 steps with a railing? : A Little 6 Click Score: 22    End of Session Equipment Utilized During Treatment: Gait belt Activity Tolerance: Patient tolerated treatment well Patient left: with call bell/phone within reach;in bed;with bed alarm set         Time: 0815-0827 PT Time Calculation (min) (ACUTE ONLY): 12  min  Charges:  $Gait Training: 8-22 mins                    G Codes:       Chesley Noon, PTA 01/20/17, 8:35 AM

## 2017-01-20 NOTE — Care Management Note (Signed)
Case Management Note  Patient Details  Name: William Armstrong MRN: [id number] Date of Birth: 30-Nov-1950  Subjective/Objective:  Discussed discharge planning with William Armstrong who reports that he already has a RW and BSC. He chose [facility] to be his home health provider and per call to [name] at [facility], this referral has already been processed by [facility].                  Action/Plan:   Expected Discharge Date:  01/20/17               Expected Discharge Plan:  [place]  In-House Referral:     Discharge planning Services  CM Consult  Post Acute Care Choice:  Durable Medical Equipment, Home Health Choice offered to:  Patient, Spouse  DME Arranged:  Bedside commode DME Agency:  [place]:  PT [place]:  [facility])  Status of Service:  In process, will continue to follow  If discussed at Long Length of Stay Meetings, dates discussed:    Additional Comments:  [name], RN 01/20/2017, 10:10 AM

## 2017-01-20 NOTE — Progress Notes (Signed)
   Subjective: 2 Days Post-Op Procedure(s) (LRB): TOTAL HIP ARTHROPLASTY ANTERIOR APPROACH (Right) Patient reports pain as 4 on 0-10 scale.  Patient appears to be resting very comfortably. No new complaints Patient is well, and has had no acute complaints or problems Patient did very well with therapy yesterday. We'll need to do the lap around the nurse's desk and steps prior to being discharged.  Plan is to go Home after hospital stay. no nausea and no vomiting Patient denies any chest pains or shortness of breath. Objective: Vital signs in last 24 hours: Temp:  [98 F (36.7 C)-99.9 F (37.7 C)] 99.9 F (37.7 C) (08/18 0501) Pulse Rate:  [55-92] 69 (08/18 0501) Resp:  [18-20] 20 (08/18 0501) BP: (110-125)/(43-55) 111/43 (08/18 0501) SpO2:  [93 %-98 %] 93 % (08/18 0501) Wound VAC in place and working. No redness or any signs of infection. Heels are non tender and elevated off the bed using rolled towels Intake/Output from previous day: 08/17 0701 - 08/18 0700 In: 720 [P.O.:720] Out: 400 [Urine:400] Intake/Output this shift: Total I/O In: -  Out: 400 [Urine:400]   Recent Labs  01/18/17 1523 01/19/17 0412 01/20/17 0326  HGB 11.2* 10.7* 10.0*    Recent Labs  01/19/17 0412 01/20/17 0326  WBC 10.1 10.5  RBC 3.69* 3.48*  HCT 31.7* 30.1*  PLT 188 180    Recent Labs  01/19/17 0412 01/20/17 0326  NA 136 138  K 3.8 3.9  CL 103 104  CO2 28 29  BUN 11 11  CREATININE 0.46* 0.52*  GLUCOSE 131* 114*  CALCIUM 8.2* 8.4*   No results for input(s): LABPT, INR in the last 72 hours.  EXAM General - Patient is Alert, Appropriate and Oriented Extremity - Neurologically intact Neurovascular intact Sensation intact distally Intact pulses distally Dorsiflexion/Plantar flexion intact No cellulitis present Compartment soft Dressing - Wound VAC in place Motor Function - intact, moving foot and toes well on exam.    Past Medical History:  Diagnosis Date  . Anemia    . Arthritis   . BCC (basal cell carcinoma of skin)   . CHF (congestive heart failure) ([place])   . Complication of anesthesia    neck hurt for 2-3 days after general anesthesia  . COPD (chronic obstructive pulmonary disease) ([place])   . Coronary artery disease   . Depression   . Glaucoma   . Heart attack ([place])    2013  . History of kidney stones   . HTN (hypertension)   . Hyperglycemia    pt denies  . Hypertension   . Hypokalemia   . Obesity    morbid  . Sleep apnea    osa, CPAP has been dc'd after 249 lb weight loss    Assessment/Plan: 2 Days Post-Op Procedure(s) (LRB): TOTAL HIP ARTHROPLASTY ANTERIOR APPROACH (Right) Active Problems:   Primary localized osteoarthritis of right hip  Estimated body mass index is 37.59 kg/m as calculated from the following:   Height as of this encounter: 5\' 7"  (1.702 m).   Weight as of this encounter: 108.9 kg (240 lb). Up with therapy Discharge home with home health  Labs: Were reviewed DVT Prophylaxis - Lovenox, Foot Pumps and TED hose Weight-Bearing as tolerated to right leg Patient may be discharged to home once she does his lap around the nurse's desk and steps this morning. Wound VAC will remain in place. Discontinued in 1 week  Rylon Poitra R. [place] 01/20/2017, 5:59 AM

## 2017-01-20 NOTE — Progress Notes (Signed)
PT Cancellation Note  Patient Details Name: William Armstrong MRN: [id number] DOB: 1951-04-01   Cancelled Treatment:    Reason Eval/Treat Not Completed: Other (comment)   Pt in bed.  Offered second BID session but pt stated he was awaiting discharge.  Reviewed general safety with pt and wife.  Both stated they were comfortable with discharge plan.   Chesley Noon 01/20/2017, 12:19 PM

## 2017-01-22 ENCOUNTER — Encounter: Payer: Self-pay | Admitting: Orthopedic Surgery

## 2017-01-22 LAB — SURGICAL PATHOLOGY

## 2017-01-30 ENCOUNTER — Emergency Department
Admission: EM | Admit: 2017-01-30 | Discharge: 2017-01-30 | Disposition: A | Discharge status: Home / Self Care | Payer: Medicare Other | Attending: Emergency Medicine | Admitting: Emergency Medicine

## 2017-01-30 ENCOUNTER — Encounter: Payer: Self-pay | Admitting: Emergency Medicine

## 2017-01-30 DIAGNOSIS — I259 Chronic ischemic heart disease, unspecified: Secondary | ICD-10-CM | POA: Diagnosis not present

## 2017-01-30 DIAGNOSIS — R55 Syncope and collapse: Secondary | ICD-10-CM

## 2017-01-30 DIAGNOSIS — I119 Hypertensive heart disease without heart failure: Secondary | ICD-10-CM | POA: Diagnosis not present

## 2017-01-30 DIAGNOSIS — R42 Dizziness and giddiness: Secondary | ICD-10-CM | POA: Insufficient documentation

## 2017-01-30 DIAGNOSIS — R531 Weakness: Secondary | ICD-10-CM | POA: Diagnosis present

## 2017-01-30 LAB — CBC
HEMATOCRIT: 36.9 % — AB (ref 40.0–52.0)
Hemoglobin: 12 g/dL — ABNORMAL LOW (ref 13.0–18.0)
MCH: 27.3 pg (ref 26.0–34.0)
MCHC: 32.5 g/dL (ref 32.0–36.0)
MCV: 84 fL (ref 80.0–100.0)
PLATELETS: 503 10*3/uL — AB (ref 150–440)
RBC: 4.39 MIL/uL — ABNORMAL LOW (ref 4.40–5.90)
RDW: 15 % — AB (ref 11.5–14.5)
WBC: 10 10*3/uL (ref 3.8–10.6)

## 2017-01-30 LAB — BASIC METABOLIC PANEL
Anion gap: 9 (ref 5–15)
BUN: 17 mg/dL (ref 6–20)
CHLORIDE: 103 mmol/L (ref 101–111)
CO2: 25 mmol/L (ref 22–32)
CREATININE: 0.43 mg/dL — AB (ref 0.61–1.24)
Calcium: 9.2 mg/dL (ref 8.9–10.3)
GFR calc Af Amer: >60 mL/min (ref >60)
GFR calc non Af Amer: >60 mL/min (ref >60)
GLUCOSE: 111 mg/dL — AB (ref 65–99)
POTASSIUM: 4.1 mmol/L (ref 3.5–5.1)
SODIUM: 137 mmol/L (ref 135–145)

## 2017-01-30 LAB — TROPONIN I: Troponin I: <0.03 ng/mL

## 2017-01-30 LAB — URINALYSIS, COMPLETE (UACMP) WITH MICROSCOPIC
BACTERIA UA: NONE SEEN
Bilirubin Urine: NEGATIVE
Glucose, UA: NEGATIVE mg/dL
HGB URINE DIPSTICK: NEGATIVE
Ketones, ur: NEGATIVE mg/dL
LEUKOCYTES UA: NEGATIVE
Nitrite: NEGATIVE
PROTEIN: NEGATIVE mg/dL
Specific Gravity, Urine: 1.017 (ref 1.005–1.030)
pH: 5 (ref 5.0–8.0)

## 2017-01-30 LAB — LIPASE, BLOOD: LIPASE: 20 U/L (ref 11–51)

## 2017-01-30 MED ORDER — HYDROCODONE-ACETAMINOPHEN 5-325 MG PO TABS
1.0000 | ORAL_TABLET | Freq: Once | ORAL | Status: AC
  Administered 2017-01-30: 1 via ORAL
  Filled 2017-01-30: qty 1

## 2017-01-30 MED ORDER — SODIUM CHLORIDE 0.9 % IV SOLN
Freq: Once | INTRAVENOUS | Status: AC
  Administered 2017-01-30: 15:00:00 via INTRAVENOUS

## 2017-01-30 NOTE — ED Notes (Signed)
Dr. Jimmye Norman in room to assess patient.  Will continue to monitor.

## 2017-01-30 NOTE — ED Provider Notes (Signed)
[facility] Emergency Department Provider Note       Time seen: ----------------------------------------- 2:38 PM on 01/30/2017 -----------------------------------------     I have reviewed the triage vital signs and the nursing notes.   HISTORY   Chief Complaint Dizziness; Nausea; and Excessive Sweating    HPI William Armstrong is a 66 y.o. male who presents to the ED for weakness and dizziness. Patient reports he had a right hip replacement 12 days ago performed at this facility. Patient reports she's been doing pretty well until last 3 days. He is had onset of dizziness which he describes as weakness and low energy. He does have some nausea and diaphoresis associated with it. He does not describe room spinning sensation. Patient reports she's had poor appetite over the past several days.   Past Medical History:  Diagnosis Date  . Anemia   . Arthritis   . BCC (basal cell carcinoma of skin)   . CHF (congestive heart failure) ([place])   . Complication of anesthesia    neck hurt for 2-3 days after general anesthesia  . COPD (chronic obstructive pulmonary disease) ([place])   . Coronary artery disease   . Depression   . Glaucoma   . Heart attack ([place])    2013  . History of kidney stones   . HTN (hypertension)   . Hyperglycemia    pt denies  . Hypertension   . Hypokalemia   . Obesity    morbid  . Sleep apnea    osa, CPAP has been dc'd after 249 lb weight loss    Patient Active Problem List   Diagnosis Date Noted  . Primary localized osteoarthritis of right hip 01/18/2017  . Hematuria 11/18/2016  . Chronic respiratory failure with hypoxia ([place]) 01/06/2015  . Chronic bronchitis ([place]) 11/30/2014  . COPD, moderate ([place]) 11/30/2014  . H/O: depression 11/30/2014  . Old myocardial infarction 11/30/2014  . HLD (hyperlipidemia) 11/30/2014  . Low back pain 11/30/2014  . Extreme obesity 11/30/2014  . Arthritis of knee, degenerative 11/30/2014  . Back  pain, thoracic 11/30/2014  . Dermatophytosis of groin 11/30/2014  . Adiposity 07/08/2014  . Bariatric surgery status 07/08/2014  . Morbid obesity ([place]) 08/21/2013  . Glaucoma 03/14/2012  . Arthritis, degenerative 03/14/2012  . Ventricular fibrillation ([place]) 03/14/2012  . Arteriosclerosis of coronary artery 02/26/2012  . CAFL (chronic airflow limitation) ([place]) 02/26/2012  . BP (high blood pressure) 02/26/2012  . Chronic obstructive pulmonary disease ([place]) 02/26/2012  . Essential (primary) hypertension 07/09/2006  . Open-angle glaucoma 07/09/2006  . Hypoxemia 07/09/2006  . Obstructive apnea 07/09/2006    Past Surgical History:  Procedure Laterality Date  . CORONARY STENT PLACEMENT  02/2012  . CYSTOSCOPY W/ RETROGRADES Left 11/18/2016   Procedure: CYSTOSCOPY WITH RETROGRADE PYELOGRAM;  Surgeon: Irine Seal, MD;  Location: [facility] ORS;  Service: Urology;  Laterality: Left;  . CYSTOSCOPY WITH STENT PLACEMENT Left 11/18/2016   Procedure: CYSTOSCOPY WITH STENT PLACEMENT;  Surgeon: Irine Seal, MD;  Location: [facility] ORS;  Service: Urology;  Laterality: Left;  . CYSTOSCOPY/URETEROSCOPY/HOLMIUM LASER/STENT PLACEMENT Left 11/29/2016   Procedure: CYSTOSCOPY/URETEROSCOPY/HOLMIUM LASER/STENT EXCHANGE;  Surgeon: Hollice Espy, MD;  Location: [facility] ORS;  Service: Urology;  Laterality: Left;  . LAPAROSCOPIC GASTRIC RESTRICTIVE DUODENAL PROCEDURE (DUODENAL SWITCH)    . LAPAROSCOPIC GASTRIC SLEEVE RESECTION  08/21/2013  . skin removal surgery     removal of extra skin approx 20 lbs  . TONSILLECTOMY AND ADENOIDECTOMY  1960  . TOTAL HIP ARTHROPLASTY Right 01/18/2017  Procedure: TOTAL HIP ARTHROPLASTY ANTERIOR APPROACH;  Surgeon: Hessie Knows, MD;  Location: [facility] ORS;  Service: Orthopedics;  Laterality: Right;    Allergies Atorvastatin and Pregabalin  Social History Social History  Substance Use Topics  . Smoking status: Never Smoker  . Smokeless tobacco: Never Used  . Alcohol use No    Review of  Systems Constitutional: Negative for fever. Cardiovascular: Negative for chest pain. Respiratory: Negative for shortness of breath. Gastrointestinal: Negative for abdominal pain, vomiting and diarrhea. Genitourinary: Negative for dysuria. Musculoskeletal: Negative for back pain. Skin:positive for diaphoresis Neurological: Negative for headaches, positive for dizziness and weakness  All systems negative/normal/unremarkable except as stated in the HPI  ____________________________________________   PHYSICAL EXAM:  VITAL SIGNS: ED Triage Vitals  Enc Vitals Group     BP 01/30/17 1147 129/64     Pulse Rate 01/30/17 1147 (!) 103     Resp --      Temp 01/30/17 1147 98.6 F (37 C)     Temp Source 01/30/17 1147 Oral     SpO2 01/30/17 1147 96 %     Weight 01/30/17 1148 227 lb (103 kg)     Height 01/30/17 1148 5\' 7"  (1.702 m)     Head Circumference --      Peak Flow --      Pain Score 01/30/17 1146 4     Pain Loc --      Pain Edu? --      Excl. in [place]? --     Constitutional: Alert and oriented. Well appearing and in no distress. Eyes: Conjunctivae are normal. Normal extraocular movements. ENT   Head: Normocephalic and atraumatic.   Nose: No congestion/rhinnorhea.   Mouth/Throat: Mucous membranes are moist.   Neck: No stridor. Cardiovascular: Normal rate, regular rhythm. No murmurs, rubs, or gallops. Respiratory: Normal respiratory effort without tachypnea nor retractions. Breath sounds are clear and equal bilaterally. No wheezes/rales/rhonchi. Gastrointestinal: Soft and nontender. Normal bowel sounds Musculoskeletal: Nontender with normal range of motion in extremities. No lower extremity tenderness nor edema. Neurologic:  Normal speech and language. No gross focal neurologic deficits are appreciated.  Skin:  Skin is warm, dry and intact. No rash noted. Psychiatric: Mood and affect are normal. Speech and behavior are normal.   ____________________________________________  EKG: Interpreted by me.sinus tachycardia with rate 102 bpm, normal PR interval, QRS, right bundle branch block, long QT  ____________________________________________  ED COURSE:  Pertinent labs & imaging results that were available during my care of the patient were reviewed by me and considered in my medical decision making (see chart for details). Patient presents for weakness and dizziness, we will assess with labs and imaging as indicated.atient will receive IV fluid bolus   Procedures ____________________________________________   LABS (pertinent positives/negatives)  Labs Reviewed  BASIC METABOLIC PANEL - Abnormal; Notable for the following:       Result Value   Glucose, Bld 111 (*)    Creatinine, Ser 0.43 (*)    All other components within normal limits  CBC - Abnormal; Notable for the following:    RBC 4.39 (*)    Hemoglobin 12.0 (*)    HCT 36.9 (*)    RDW 15.0 (*)    Platelets 503 (*)    All other components within normal limits  URINALYSIS, COMPLETE (UACMP) WITH MICROSCOPIC - Abnormal; Notable for the following:    Color, Urine YELLOW (*)    APPearance HAZY (*)    Squamous Epithelial / LPF 0-5 (*)  All other components within normal limits  TROPONIN I  LIPASE, BLOOD  TROPONIN I  CBG MONITORING, ED   ____________________________________________  FINAL ASSESSMENT AND PLAN  dizziness  Plan: Patient's labs and imaging were dictated above. Patient had presented for dizziness that is likely related to poor oral intake. We did give IV fluids and check orthostatics. He is currently pending a repeat troponin but overall appears stable at this time.   Earleen Newport, MD   Note: This note was generated in part or whole with voice recognition software. Voice recognition is usually quite accurate but there are transcription errors that can and very often do occur. I apologize for any typographical errors that  were not detected and corrected.     Earleen Newport, MD 01/30/17 [phone]

## 2017-01-30 NOTE — ED Provider Notes (Signed)
-----------------------------------------   4:27 PM on 01/30/2017 -----------------------------------------  Repeat troponin is negative. Patient states he feels "100% better" than he did when he arrived, and feels well to go home. Patient has follow-up with his orthopedist in 3 days, and states he will also follow up with his primary care. Return precautions given.   Arta Silence, MD 01/30/17 [phone]

## 2017-01-30 NOTE — ED Notes (Signed)
Patient presents to the ED with weakness and lack of appetite for the past several days and dizziness that began today.  Patient had a recent right hip replacement.  Patient reports pain to right hip.

## 2017-01-30 NOTE — ED Triage Notes (Signed)
Patient had a right hip replacement on 01/18/17 at this facility. "Been doing pretty good until the last couple 3 days".  Onset dizziness, weakness, and nausea with diaphoresis starting at 10 AM.  At this time, he is complaining of nausea and "light headedness" and "I don't feel normal".  Alert and oriented.  Reports that he has no appetite and has not been drinking a lot.  Took Hydrocodone 5 mg. At 10 AM.  Color is pale, skin warm and dry at this time.

## 2017-02-27 NOTE — Progress Notes (Signed)
02/28/2017 10:02 PM   William Armstrong Dec 27, 1950 [id number]  Referring provider: [name], [address] [place], [place] [address]  No chief complaint on file.   HPI: 66 yo WM with a history of bilateral nephrolithiasis who presents today to discuss treatment of his right sided stones.  Background history Patient underwent  left ureteroscopy, laser lithotripsy, basket extraction of stone fragment, left retrograde pyelogram and left ureteral stent exchange for a 13 mm left distal ureteral stone and smaller renal stone.  He pulled his own stent.  RUS performed on 12/26/2016 noted no hydronephrosis, bilateral cysts and bilateral nephrolithiasis.       PMH: Past Medical History:  Diagnosis Date  . Anemia   . Arthritis   . BCC (basal cell carcinoma of skin)   . CHF (congestive heart failure) ([place])   . Complication of anesthesia    neck hurt for 2-3 days after general anesthesia  . COPD (chronic obstructive pulmonary disease) ([place])   . Coronary artery disease   . Depression   . Glaucoma   . Heart attack ([place])    2013  . History of kidney stones   . HTN (hypertension)   . Hyperglycemia    pt denies  . Hypertension   . Hypokalemia   . Obesity    morbid  . Sleep apnea    osa, CPAP has been dc'd after 249 lb weight loss    Surgical History: Past Surgical History:  Procedure Laterality Date  . CORONARY STENT PLACEMENT  02/2012  . CYSTOSCOPY W/ RETROGRADES Left 11/18/2016   Procedure: CYSTOSCOPY WITH RETROGRADE PYELOGRAM;  Surgeon: Irine Seal, MD;  Location: [facility];  Service: Urology;  Laterality: Left;  . CYSTOSCOPY WITH STENT PLACEMENT Left 11/18/2016   Procedure: CYSTOSCOPY WITH STENT PLACEMENT;  Surgeon: Irine Seal, MD;  Location: [facility];  Service: Urology;  Laterality: Left;  . CYSTOSCOPY/URETEROSCOPY/HOLMIUM LASER/STENT PLACEMENT Left 11/29/2016   Procedure: CYSTOSCOPY/URETEROSCOPY/HOLMIUM LASER/STENT EXCHANGE;  Surgeon: Hollice Espy, MD;   Location: [facility];  Service: Urology;  Laterality: Left;  . LAPAROSCOPIC GASTRIC RESTRICTIVE DUODENAL PROCEDURE (DUODENAL SWITCH)    . LAPAROSCOPIC GASTRIC SLEEVE RESECTION  08/21/2013  . skin removal surgery     removal of extra skin approx 20 lbs  . TONSILLECTOMY AND ADENOIDECTOMY  1960  . TOTAL HIP ARTHROPLASTY Right 01/18/2017   Procedure: TOTAL HIP ARTHROPLASTY ANTERIOR APPROACH;  Surgeon: Hessie Knows, MD;  Location: [facility];  Service: Orthopedics;  Laterality: Right;    Home Medications:  Allergies as of 02/28/2017      Reactions   Atorvastatin Other (See Comments)   Muscle aches.   Pregabalin Other (See Comments)   Muscle soreness      Medication List       Accurate as of 02/27/17 10:02 PM. Always use your most recent med list.          calcium-vitamin D 500-200 MG-UNIT tablet Take 1 tablet by mouth daily.   CRESTOR 10 MG tablet Generic drug:  rosuvastatin Take 10 mg by mouth every other day.   dorzolamide 2 % ophthalmic solution Commonly known as:  TRUSOPT Place 1 drop into both eyes 3 (three) times daily.   econazole nitrate 1 % cream Apply 1 application topically 2 (two) times daily as needed (for rash).   enoxaparin 40 MG/0.4ML injection Commonly known as:  LOVENOX Inject 0.4 mLs (40 mg total) into the skin daily.   HYDROcodone-acetaminophen 5-325 MG tablet Commonly known as:  NORCO/VICODIN Take 1-2 tablets  by mouth every 6 (six) hours as needed for moderate pain.   latanoprost 0.005 % ophthalmic solution Commonly known as:  XALATAN Place 1 drop into both eyes at bedtime.   lisinopril 40 MG tablet Commonly known as:  PRINIVIL,ZESTRIL Take 40 mg by mouth daily.   multivitamin with minerals Tabs tablet Take 1 tablet by mouth daily.   neomycin-bacitracin-polymyxin ointment Commonly known as:  NEOSPORIN Apply 1 application topically 3 (three) times daily as needed for wound care.   nystatin cream Commonly known as:  MYCOSTATIN Apply 1  application topically 2 (two) times daily as needed (for rash).   oxybutynin 5 MG tablet Commonly known as:  DITROPAN Take 1 tablet (5 mg total) by mouth every 8 (eight) hours as needed for bladder spasms.   oxyCODONE 5 MG immediate release tablet Commonly known as:  Oxy IR/ROXICODONE Take 1 tablet (5 mg total) by mouth every 6 (six) hours as needed for moderate pain.   oxyCODONE 5 MG immediate release tablet Commonly known as:  Oxy IR/ROXICODONE Take 1-2 tablets (5-10 mg total) by mouth every 3 (three) hours as needed for breakthrough pain.   sertraline 100 MG tablet Commonly known as:  ZOLOFT Take 1 tablet (100 mg total) by mouth daily.   sildenafil 20 MG tablet Commonly known as:  REVATIO Take 100 mg by mouth daily as needed (for erectile dysfunction).   tamsulosin 0.4 MG Caps capsule Commonly known as:  FLOMAX Take 1 capsule (0.4 mg total) by mouth daily.   traMADol 50 MG tablet Commonly known as:  ULTRAM Take 50 mg by mouth every 6 (six) hours as needed for moderate pain.   Vitamin D 2000 units Caps Take 2,000 Units by mouth daily.   zinc gluconate 50 MG tablet Take 50 mg by mouth daily.       Allergies:  Allergies  Allergen Reactions  . Atorvastatin Other (See Comments)    Muscle aches.  . Pregabalin Other (See Comments)    Muscle soreness    Family History: Family History  Problem Relation Age of Onset  . Heart attack Mother   . Brain cancer Father   . Heart attack Sister   . Congenital heart disease Sister   . Leukemia Paternal Grandmother   . COPD Brother   . Heart disease Brother   . Prostate cancer Neg Hx   . Kidney cancer Neg Hx   . Bladder Cancer Neg Hx     Social History:  reports that he has never smoked. He has never used smokeless tobacco. He reports that he does not drink alcohol or use drugs.  ROS:                                        Physical Exam: There were no vitals taken for this visit.    Constitutional:  Alert and oriented, No acute distress.  Accompanied by wife today. HEENT:  AT, moist mucus membranes.  Trachea midline, no masses. Cardiovascular: No clubbing, cyanosis, or edema. Respiratory: Normal respiratory effort, no increased work of breathing. GI: Abdomen is soft, nontender, nondistended, no abdominal masses.  Obese. GU: No CVA tenderness.  Skin: No rashes, bruises or suspicious lesions. Neurologic: Grossly intact, no focal deficits, moving all 4 extremities. Psychiatric: Normal mood and affect.  Laboratory Data: Lab Results  Component Value Date   WBC 10.0 01/30/2017   HGB 12.0 (L) 01/30/2017  HCT 36.9 (L) 01/30/2017   MCV 84.0 01/30/2017   PLT 503 (H) 01/30/2017    Lab Results  Component Value Date   CREATININE 0.43 (L) 01/30/2017     Results for orders placed or performed during the hospital encounter of 96/22/29  Basic metabolic panel  Result Value Ref Range   Sodium 137 135 - 145 mmol/L   Potassium 4.1 3.5 - 5.1 mmol/L   Chloride 103 101 - 111 mmol/L   CO2 25 22 - 32 mmol/L   Glucose, Bld 111 (H) 65 - 99 mg/dL   BUN 17 6 - 20 mg/dL   Creatinine, Ser 0.43 (L) 0.61 - 1.24 mg/dL   Calcium 9.2 8.9 - 10.3 mg/dL   GFR calc non Af Amer >60 >60 mL/min   GFR calc Af Amer >60 >60 mL/min   Anion gap 9 5 - 15  CBC  Result Value Ref Range   WBC 10.0 3.8 - 10.6 K/uL   RBC 4.39 (L) 4.40 - 5.90 MIL/uL   Hemoglobin 12.0 (L) 13.0 - 18.0 g/dL   HCT 36.9 (L) 40.0 - 52.0 %   MCV 84.0 80.0 - 100.0 fL   MCH 27.3 26.0 - 34.0 pg   MCHC 32.5 32.0 - 36.0 g/dL   RDW 15.0 (H) 11.5 - 14.5 %   Platelets 503 (H) 150 - 440 K/uL  Urinalysis, Complete w Microscopic  Result Value Ref Range   Color, Urine YELLOW (A) YELLOW   APPearance HAZY (A) CLEAR   Specific Gravity, Urine 1.017 1.005 - 1.030   pH 5.0 5.0 - 8.0   Glucose, UA NEGATIVE NEGATIVE mg/dL   Hgb urine dipstick NEGATIVE NEGATIVE   Bilirubin Urine NEGATIVE NEGATIVE   Ketones, ur NEGATIVE NEGATIVE  mg/dL   Protein, ur NEGATIVE NEGATIVE mg/dL   Nitrite NEGATIVE NEGATIVE   Leukocytes, UA NEGATIVE NEGATIVE   RBC / HPF 0-5 0 - 5 RBC/hpf   WBC, UA 0-5 0 - 5 WBC/hpf   Bacteria, UA NONE SEEN NONE SEEN   Squamous Epithelial / LPF 0-5 (A) NONE SEEN   Mucus PRESENT    Ca Oxalate Crys, UA PRESENT   Troponin I  Result Value Ref Range   Troponin I <0.03 <0.03 ng/mL  Lipase, blood  Result Value Ref Range   Lipase 20 11 - 51 U/L  Troponin I  Result Value Ref Range   Troponin I <0.03 <0.03 ng/mL   I have reviewed the labs  Pertinent Imaging: CLINICAL DATA:  History of lithotripsy for left-sided ureteral stone on November 29, 2016  EXAM: RENAL / URINARY TRACT ULTRASOUND COMPLETE  COMPARISON:  CT scan of the abdomen and pelvis of November 18, 2016.  FINDINGS: Right Kidney:  Length: 14 cm. Multiple kidney stones are observed with the largest lying in the midpole measuring 10 mm in diameter. There is an upper pole cyst measuring 3.1 x 2.4 x 1.7 cm. There is a lower pole cyst measuring 2.3 x 1.5 x 2 cm. The renal cortical echotexture remains lower than that of the liver.  Left Kidney:  Length: 13.4 cm. There is a lower pole stone measuring 9 mm in greatest dimension. There is an upper pole cyst measuring 1.2 x 1.1 x 1.1 cm. There is no hydronephrosis. The renal cortical echotexture is similar to that on the right.  Bladder:  The urinary bladder is only partially distended.  IMPRESSION: There is no hydronephrosis. There bilateral kidney stones and simple appearing cysts.   Electronically Signed  By: David  Martinique M.D.   On: 12/26/2016 11:33   Assessment & Plan:    1. Left ureteral stone  - s/p left URS  - no hydronephrosis seen on RUS   2. Kidney stones  - patient would like to wait until after his hip surgery to address the right stones  - RTC in late September  - Advised to contact our office or seek treatment in the ED if becomes febrile or pain/  vomiting are difficult control in order to arrange for emergent/urgent intervention Zara Council, PA-C  [facility] [address], [place], [place] [address] [phone]

## 2017-02-28 ENCOUNTER — Ambulatory Visit (INDEPENDENT_AMBULATORY_CARE_PROVIDER_SITE_OTHER): Payer: Medicare Other | Admitting: Urology

## 2017-02-28 ENCOUNTER — Other Ambulatory Visit: Payer: Self-pay | Admitting: Radiology

## 2017-02-28 ENCOUNTER — Encounter: Payer: Self-pay | Admitting: Urology

## 2017-02-28 VITALS — BP 163/72 | HR 56 | Ht 67.0 in | Wt 228.9 lb

## 2017-02-28 DIAGNOSIS — N200 Calculus of kidney: Secondary | ICD-10-CM

## 2017-02-28 LAB — URINALYSIS, COMPLETE
BILIRUBIN UA: NEGATIVE
GLUCOSE, UA: NEGATIVE
Ketones, UA: NEGATIVE
Leukocytes, UA: NEGATIVE
NITRITE UA: NEGATIVE
PH UA: 5 (ref 5.0–7.5)
Protein, UA: NEGATIVE
RBC, UA: NEGATIVE
Specific Gravity, UA: 1.015 (ref 1.005–1.030)
UUROB: 0.2 mg/dL (ref 0.2–1.0)

## 2017-02-28 NOTE — Progress Notes (Signed)
02/28/2017 2:15 PM   William Armstrong 04/02/1951 [id number]  Referring provider: [name], [address] [place], [place] [address]  Chief Complaint  Patient presents with  . Nephrolithiasis    HPI: 66 yo WM with a history of bilateral nephrolithiasis who presents today to discuss treatment of his right sided stones.  Background history Patient underwent  left ureteroscopy, laser lithotripsy, basket extraction of stone fragment, left retrograde pyelogram and left ureteral stent exchange for a 13 mm left distal ureteral stone and smaller renal stone.  He pulled his own stent.  RUS performed on 12/26/2016 noted no hydronephrosis, bilateral cysts and bilateral nephrolithiasis.    Patient is now 6 weeks out from hip replacement surgery and would like his right stone addressed at this time.  He is not having any flank pain or gross hematuria.  He has not passed any fragments.  He's not had any fevers, chills, nausea or vomiting.    CT Renal stone study performed on 11/2016 noted renal stones in the lower pole of the right kidney measuring 9.6 mm   His UA today was unremarkable.     PMH: Past Medical History:  Diagnosis Date  . Anemia   . Arthritis   . BCC (basal cell carcinoma of skin)   . CHF (congestive heart failure) ([place])   . Complication of anesthesia    neck hurt for 2-3 days after general anesthesia  . COPD (chronic obstructive pulmonary disease) ([place])   . Coronary artery disease   . Depression   . Glaucoma   . Heart attack ([place])    2013  . History of kidney stones   . HTN (hypertension)   . Hyperglycemia    pt denies  . Hypertension   . Hypokalemia   . Obesity    morbid  . Sleep apnea    osa, CPAP has been dc'd after 249 lb weight loss    Surgical History: Past Surgical History:  Procedure Laterality Date  . CORONARY STENT PLACEMENT  02/2012  . CYSTOSCOPY W/ RETROGRADES Left 11/18/2016   Procedure: CYSTOSCOPY WITH RETROGRADE PYELOGRAM;   Surgeon: Irine Seal, MD;  Location: [facility];  Service: Urology;  Laterality: Left;  . CYSTOSCOPY WITH STENT PLACEMENT Left 11/18/2016   Procedure: CYSTOSCOPY WITH STENT PLACEMENT;  Surgeon: Irine Seal, MD;  Location: [facility];  Service: Urology;  Laterality: Left;  . CYSTOSCOPY/URETEROSCOPY/HOLMIUM LASER/STENT PLACEMENT Left 11/29/2016   Procedure: CYSTOSCOPY/URETEROSCOPY/HOLMIUM LASER/STENT EXCHANGE;  Surgeon: Hollice Espy, MD;  Location: [facility];  Service: Urology;  Laterality: Left;  . LAPAROSCOPIC GASTRIC RESTRICTIVE DUODENAL PROCEDURE (DUODENAL SWITCH)    . LAPAROSCOPIC GASTRIC SLEEVE RESECTION  08/21/2013  . skin removal surgery     removal of extra skin approx 20 lbs  . TONSILLECTOMY AND ADENOIDECTOMY  1960  . TOTAL HIP ARTHROPLASTY Right 01/18/2017   Procedure: TOTAL HIP ARTHROPLASTY ANTERIOR APPROACH;  Surgeon: Hessie Knows, MD;  Location: [facility];  Service: Orthopedics;  Laterality: Right;    Home Medications:  Allergies as of 02/28/2017      Reactions   Atorvastatin Other (See Comments)   Muscle aches.   Pregabalin Other (See Comments)   Muscle soreness      Medication List       Accurate as of 02/28/17  2:15 PM. Always use your most recent med list.          calcium-vitamin D 500-200 MG-UNIT tablet Take 1 tablet by mouth daily.   CRESTOR 10 MG tablet Generic drug:  rosuvastatin Take 10 mg by mouth every other day.   dorzolamide 2 % ophthalmic solution Commonly known as:  TRUSOPT Place 1 drop into both eyes 3 (three) times daily.   econazole nitrate 1 % cream Apply 1 application topically 2 (two) times daily as needed (for rash).   enoxaparin 40 MG/0.4ML injection Commonly known as:  LOVENOX Inject 0.4 mLs (40 mg total) into the skin daily.   HYDROcodone-acetaminophen 5-325 MG tablet Commonly known as:  NORCO/VICODIN Take 1-2 tablets by mouth every 6 (six) hours as needed for moderate pain.   latanoprost 0.005 % ophthalmic solution Commonly known  as:  XALATAN Place 1 drop into both eyes at bedtime.   lisinopril 40 MG tablet Commonly known as:  PRINIVIL,ZESTRIL Take 40 mg by mouth daily.   multivitamin with minerals Tabs tablet Take 1 tablet by mouth daily.   neomycin-bacitracin-polymyxin ointment Commonly known as:  NEOSPORIN Apply 1 application topically 3 (three) times daily as needed for wound care.   nystatin cream Commonly known as:  MYCOSTATIN Apply 1 application topically 2 (two) times daily as needed (for rash).   oxybutynin 5 MG tablet Commonly known as:  DITROPAN Take 1 tablet (5 mg total) by mouth every 8 (eight) hours as needed for bladder spasms.   oxyCODONE 5 MG immediate release tablet Commonly known as:  Oxy IR/ROXICODONE Take 1 tablet (5 mg total) by mouth every 6 (six) hours as needed for moderate pain.   oxyCODONE 5 MG immediate release tablet Commonly known as:  Oxy IR/ROXICODONE Take 1-2 tablets (5-10 mg total) by mouth every 3 (three) hours as needed for breakthrough pain.   sertraline 100 MG tablet Commonly known as:  ZOLOFT Take 1 tablet (100 mg total) by mouth daily.   sildenafil 20 MG tablet Commonly known as:  REVATIO Take 100 mg by mouth daily as needed (for erectile dysfunction).   tamsulosin 0.4 MG Caps capsule Commonly known as:  FLOMAX Take 1 capsule (0.4 mg total) by mouth daily.   traMADol 50 MG tablet Commonly known as:  ULTRAM Take 50 mg by mouth every 6 (six) hours as needed for moderate pain.   Vitamin D 2000 units Caps Take 2,000 Units by mouth daily.   zinc gluconate 50 MG tablet Take 50 mg by mouth daily.            Discharge Care Instructions        Start     Ordered   02/28/17 0000  Urinalysis, Complete     02/28/17 1329   02/28/17 0000  CULTURE, URINE COMPREHENSIVE     02/28/17 1329   02/28/17 7062  Basic metabolic panel     37/62/83 1329   02/28/17 0000  CBC with Differential/Platelet     02/28/17 1329      Allergies:  Allergies  Allergen  Reactions  . Atorvastatin Other (See Comments)    Muscle aches.  . Pregabalin Other (See Comments)    Muscle soreness    Family History: Family History  Problem Relation Age of Onset  . Heart attack Mother   . Brain cancer Father   . Heart attack Sister   . Congenital heart disease Sister   . Leukemia Paternal Grandmother   . COPD Brother   . Heart disease Brother   . Prostate cancer Neg Hx   . Kidney cancer Neg Hx   . Bladder Cancer Neg Hx     Social History:  reports that he has never smoked. He has  never used smokeless tobacco. He reports that he does not drink alcohol or use drugs.  ROS: UROLOGY Frequent Urination?: No Hard to postpone urination?: No Burning/pain with urination?: No Get up at night to urinate?: No Leakage of urine?: No Urine stream starts and stops?: No Trouble starting stream?: No Do you have to strain to urinate?: No Blood in urine?: No Urinary tract infection?: No Sexually transmitted disease?: No Injury to kidneys or bladder?: No Painful intercourse?: No Weak stream?: No Erection problems?: No Penile pain?: No  Gastrointestinal Nausea?: No Vomiting?: No Indigestion/heartburn?: No Diarrhea?: No Constipation?: No  Constitutional Fever: No Night sweats?: No Weight loss?: No Fatigue?: No  Skin Skin rash/lesions?: No Itching?: No  Eyes Blurred vision?: No Double vision?: No  Ears/Nose/Throat Sore throat?: No Sinus problems?: No  Hematologic/Lymphatic Swollen glands?: No Easy bruising?: No  Cardiovascular Leg swelling?: No Chest pain?: No  Respiratory Cough?: No Shortness of breath?: No  Endocrine Excessive thirst?: No  Musculoskeletal Back pain?: No Joint pain?: No  Neurological Headaches?: No Dizziness?: No  Psychologic Depression?: Yes Anxiety?: No  Physical Exam: BP (!) 163/72 (BP Location: Right Arm, Patient Position: Sitting, Cuff Size: Normal)   Pulse (!) 56   Ht 5\' 7"  (1.702 m)   Wt 228 lb  14.4 oz (103.8 kg)   BMI 35.85 kg/m   Constitutional:  Alert and oriented, No acute distress.  Accompanied by wife today. HEENT: [place] AT, moist mucus membranes.  Trachea midline, no masses. Cardiovascular: No clubbing, cyanosis, or edema. Respiratory: Normal respiratory effort, no increased work of breathing. GI: Abdomen is soft, nontender, nondistended, no abdominal masses.  Obese. GU: No CVA tenderness.  Skin: No rashes, bruises or suspicious lesions. Neurologic: Grossly intact, no focal deficits, moving all 4 extremities. Psychiatric: Normal mood and affect.  Laboratory Data: Lab Results  Component Value Date   WBC 10.0 01/30/2017   HGB 12.0 (L) 01/30/2017   HCT 36.9 (L) 01/30/2017   MCV 84.0 01/30/2017   PLT 503 (H) 01/30/2017    Lab Results  Component Value Date   CREATININE 0.43 (L) 01/30/2017     Results for orders placed or performed during the hospital encounter of 16/10/96  Basic metabolic panel  Result Value Ref Range   Sodium 137 135 - 145 mmol/L   Potassium 4.1 3.5 - 5.1 mmol/L   Chloride 103 101 - 111 mmol/L   CO2 25 22 - 32 mmol/L   Glucose, Bld 111 (H) 65 - 99 mg/dL   BUN 17 6 - 20 mg/dL   Creatinine, Ser 0.43 (L) 0.61 - 1.24 mg/dL   Calcium 9.2 8.9 - 10.3 mg/dL   GFR calc non Af Amer >60 >60 mL/min   GFR calc Af Amer >60 >60 mL/min   Anion gap 9 5 - 15  CBC  Result Value Ref Range   WBC 10.0 3.8 - 10.6 K/uL   RBC 4.39 (L) 4.40 - 5.90 MIL/uL   Hemoglobin 12.0 (L) 13.0 - 18.0 g/dL   HCT 36.9 (L) 40.0 - 52.0 %   MCV 84.0 80.0 - 100.0 fL   MCH 27.3 26.0 - 34.0 pg   MCHC 32.5 32.0 - 36.0 g/dL   RDW 15.0 (H) 11.5 - 14.5 %   Platelets 503 (H) 150 - 440 K/uL  Urinalysis, Complete w Microscopic  Result Value Ref Range   Color, Urine YELLOW (A) YELLOW   APPearance HAZY (A) CLEAR   Specific Gravity, Urine 1.017 1.005 - 1.030   pH 5.0 5.0 -  8.0   Glucose, UA NEGATIVE NEGATIVE mg/dL   Hgb urine dipstick NEGATIVE NEGATIVE   Bilirubin Urine NEGATIVE  NEGATIVE   Ketones, ur NEGATIVE NEGATIVE mg/dL   Protein, ur NEGATIVE NEGATIVE mg/dL   Nitrite NEGATIVE NEGATIVE   Leukocytes, UA NEGATIVE NEGATIVE   RBC / HPF 0-5 0 - 5 RBC/hpf   WBC, UA 0-5 0 - 5 WBC/hpf   Bacteria, UA NONE SEEN NONE SEEN   Squamous Epithelial / LPF 0-5 (A) NONE SEEN   Mucus PRESENT    Ca Oxalate Crys, UA PRESENT   Troponin I  Result Value Ref Range   Troponin I <0.03 <0.03 ng/mL  Lipase, blood  Result Value Ref Range   Lipase 20 11 - 51 U/L  Troponin I  Result Value Ref Range   Troponin I <0.03 <0.03 ng/mL   Urinalysis Unremarkable.  See EPIC.    I have reviewed the labs  Pertinent Imaging: CLINICAL DATA:  Left flank pain.  Hematuria.  EXAM: CT ABDOMEN AND PELVIS WITHOUT CONTRAST  TECHNIQUE: Multidetector CT imaging of the abdomen and pelvis was performed following the standard protocol without IV contrast.  COMPARISON:  Ultrasound 10/16/2016  FINDINGS: Lower chest: Advanced calcific atherosclerotic disease of the coronary arteries. Mildly enlarged heart.  Hepatobiliary: 2.2 cm left hepatic cyst. A second 1 cm hypoattenuated lesion in the dome of the liver, too small to be actually characterize by CT.  Pancreas: Somewhat atrophic.  Spleen: Normal in size without focal abnormality.  Adrenals/Urinary Tract: Normal adrenal glands. Bilateral nephrolithiasis, with renal stones in the lower pole of the right kidney measuring 9.6 mm and lower pole of the left kidney measuring 9 mm. No evidence of right hydronephrosis. Moderate left hydronephrosis and perinephric fat stranding. Moderate left hydroureter. Fat stranding along the dilated ureter noted as well. Obstructive 13 mm stone in the distal left ureter. Bulbous dilation of the left ureter just upstream to the level of obstruction with small amount of periureteral free fluid.  Stomach/Bowel: No evidence of bowel wall thickening, distention, or inflammatory changes. Postsurgical  changes from gastric bypass.  Vascular/Lymphatic: Aortic atherosclerosis. No enlarged abdominal or pelvic lymph nodes.  Reproductive: Prostate is unremarkable.  Other: Longitudinal fluid collection along the superficial fascia of the right rectus abdominus measures approximately 14 cm in craniocaudal dimension. Fat stranding in the right flank and right anterior abdominal wall may be postsurgical.  Musculoskeletal: Multilevel osteoarthritic changes and posterior facet arthropathy of the lumbosacral spine.  IMPRESSION: Left obstructive uropathy caused by 13 mm distal left ureteral stone. Bulbous dilation of the left ureter just upstream to the level of the obstruction with a small amount of periureteral free fluid. These may represent postobstructive inflammatory changes versus micro rupture.  Bilateral nephrolithiasis.  Longitudinal fluid collection within the right paramedian anterior abdominal wall, measuring 14 cm in craniocaudal dimension. Please correlate to clinical exam findings.  1 cm hypoattenuated lesion within the dome of the liver, incompletely characterized.  Calcific atherosclerotic disease of the aorta.   Electronically Signed   By: Fidela Salisbury M.D.   On: 11/18/2016 16:17  CLINICAL DATA:  History of lithotripsy for left-sided ureteral stone on November 29, 2016  EXAM: RENAL / URINARY TRACT ULTRASOUND COMPLETE  COMPARISON:  CT scan of the abdomen and pelvis of November 18, 2016.  FINDINGS: Right Kidney:  Length: 14 cm. Multiple kidney stones are observed with the largest lying in the midpole measuring 10 mm in diameter. There is an upper pole cyst measuring 3.1 x 2.4  x 1.7 cm. There is a lower pole cyst measuring 2.3 x 1.5 x 2 cm. The renal cortical echotexture remains lower than that of the liver.  Left Kidney:  Length: 13.4 cm. There is a lower pole stone measuring 9 mm in greatest dimension. There is an upper pole cyst  measuring 1.2 x 1.1 x 1.1 cm. There is no hydronephrosis. The renal cortical echotexture is similar to that on the right.  Bladder:  The urinary bladder is only partially distended.  IMPRESSION: There is no hydronephrosis. There bilateral kidney stones and simple appearing cysts.   Electronically Signed   By: David  Martinique M.D.   On: 12/26/2016 11:33   Assessment & Plan:    Patient would like to have right ureteroscopy with laser lithotripsy with ureteral stent placement for definitive treatment of his 9 mm right renal stone.    1. Right renal stone  - schedule right ureteroscopy with laser lithotripsy and ureteral stent placement  - explained to the patient how the procedure is performed and the risks involved  - informed patient that they will have a stent placed during the procedure and will remain in place after the procedure for a short time.   - stent may be removed in the office with a cystoscope or patient may be instructed to remove the stent themselves by the string  - described "stent pain" as feelings of needing to urinate/overactive bladder and a warm, tingling sensation to intense pain in the affected flank  - residual stones within the kidney or ureter may be present after the procedure and may need to have these addressed at a different encounter  - injury to the ureter is the most common intra-operative risk, it may result in an open procedure to correct the defect  - infection and bleeding are also risks  - explained the risks of general anesthesia, such as: MI, CVA, paralysis, coma and/or death.  - advised to contact our office or seek treatment in the ED if becomes febrile or pain/ vomiting are difficult control in order to arrange for emergent/urgent intervention  Zara Council, PA-C  [facility] [address] [place], [place] [address] [phone]

## 2017-03-01 LAB — CBC WITH DIFFERENTIAL/PLATELET
BASOS ABS: 0 10*3/uL (ref 0.0–0.2)
Basos: 0 %
EOS (ABSOLUTE): 0.2 10*3/uL (ref 0.0–0.4)
Eos: 2 %
HEMOGLOBIN: 12.2 g/dL — AB (ref 13.0–17.7)
Hematocrit: 38.3 % (ref 37.5–51.0)
IMMATURE GRANS (ABS): 0 10*3/uL (ref 0.0–0.1)
IMMATURE GRANULOCYTES: 0 %
LYMPHS: 30 %
Lymphocytes Absolute: 2 10*3/uL (ref 0.7–3.1)
MCH: 27.2 pg (ref 26.6–33.0)
MCHC: 31.9 g/dL (ref 31.5–35.7)
MCV: 86 fL (ref 79–97)
MONOCYTES: 8 %
Monocytes Absolute: 0.5 10*3/uL (ref 0.1–0.9)
NEUTROS ABS: 4 10*3/uL (ref 1.4–7.0)
NEUTROS PCT: 60 %
PLATELETS: 237 10*3/uL (ref 150–379)
RBC: 4.48 x10E6/uL (ref 4.14–5.80)
RDW: 15.6 % — ABNORMAL HIGH (ref 12.3–15.4)
WBC: 6.7 10*3/uL (ref 3.4–10.8)

## 2017-03-01 LAB — BASIC METABOLIC PANEL
BUN/Creatinine Ratio: 26 — ABNORMAL HIGH (ref 10–24)
BUN: 14 mg/dL (ref 8–27)
CALCIUM: 8.8 mg/dL (ref 8.6–10.2)
CHLORIDE: 102 mmol/L (ref 96–106)
CO2: 27 mmol/L (ref 20–29)
Creatinine, Ser: 0.54 mg/dL — ABNORMAL LOW (ref 0.76–1.27)
GFR calc non Af Amer: 108 mL/min/{1.73_m2} (ref >59)
GFR, EST AFRICAN AMERICAN: 125 mL/min/{1.73_m2} (ref >59)
Glucose: 87 mg/dL (ref 65–99)
Potassium: 4.4 mmol/L (ref 3.5–5.2)
Sodium: 142 mmol/L (ref 134–144)

## 2017-03-05 ENCOUNTER — Encounter
Admission: RE | Admit: 2017-03-05 | Discharge: 2017-03-05 | Disposition: A | Discharge status: Home / Self Care | Payer: Medicare Other | Source: Ambulatory Visit | Attending: Urology | Admitting: Urology

## 2017-03-05 DIAGNOSIS — Z85828 Personal history of other malignant neoplasm of skin: Secondary | ICD-10-CM | POA: Diagnosis not present

## 2017-03-05 DIAGNOSIS — Z96641 Presence of right artificial hip joint: Secondary | ICD-10-CM | POA: Diagnosis not present

## 2017-03-05 DIAGNOSIS — Z87442 Personal history of urinary calculi: Secondary | ICD-10-CM | POA: Diagnosis not present

## 2017-03-05 DIAGNOSIS — M1990 Unspecified osteoarthritis, unspecified site: Secondary | ICD-10-CM | POA: Diagnosis not present

## 2017-03-05 DIAGNOSIS — N202 Calculus of kidney with calculus of ureter: Secondary | ICD-10-CM | POA: Diagnosis not present

## 2017-03-05 DIAGNOSIS — F329 Major depressive disorder, single episode, unspecified: Secondary | ICD-10-CM | POA: Diagnosis not present

## 2017-03-05 DIAGNOSIS — H409 Unspecified glaucoma: Secondary | ICD-10-CM | POA: Diagnosis not present

## 2017-03-05 DIAGNOSIS — I110 Hypertensive heart disease with heart failure: Secondary | ICD-10-CM | POA: Diagnosis not present

## 2017-03-05 DIAGNOSIS — I088 Other rheumatic multiple valve diseases: Secondary | ICD-10-CM | POA: Diagnosis not present

## 2017-03-05 DIAGNOSIS — I252 Old myocardial infarction: Secondary | ICD-10-CM | POA: Diagnosis not present

## 2017-03-05 DIAGNOSIS — Z955 Presence of coronary angioplasty implant and graft: Secondary | ICD-10-CM | POA: Diagnosis not present

## 2017-03-05 DIAGNOSIS — I509 Heart failure, unspecified: Secondary | ICD-10-CM | POA: Diagnosis not present

## 2017-03-05 DIAGNOSIS — Z6835 Body mass index (BMI) 35.0-35.9, adult: Secondary | ICD-10-CM | POA: Diagnosis not present

## 2017-03-05 DIAGNOSIS — Z79899 Other long term (current) drug therapy: Secondary | ICD-10-CM | POA: Diagnosis not present

## 2017-03-05 DIAGNOSIS — I2510 Atherosclerotic heart disease of native coronary artery without angina pectoris: Secondary | ICD-10-CM | POA: Diagnosis not present

## 2017-03-05 DIAGNOSIS — I272 Pulmonary hypertension, unspecified: Secondary | ICD-10-CM | POA: Diagnosis not present

## 2017-03-05 DIAGNOSIS — G4733 Obstructive sleep apnea (adult) (pediatric): Secondary | ICD-10-CM | POA: Diagnosis not present

## 2017-03-05 DIAGNOSIS — Z7982 Long term (current) use of aspirin: Secondary | ICD-10-CM | POA: Diagnosis not present

## 2017-03-05 DIAGNOSIS — I700 Atherosclerosis of aorta: Secondary | ICD-10-CM | POA: Diagnosis not present

## 2017-03-05 DIAGNOSIS — Z888 Allergy status to other drugs, medicaments and biological substances status: Secondary | ICD-10-CM | POA: Diagnosis not present

## 2017-03-05 DIAGNOSIS — J449 Chronic obstructive pulmonary disease, unspecified: Secondary | ICD-10-CM | POA: Diagnosis not present

## 2017-03-05 NOTE — Patient Instructions (Signed)
  Your procedure is scheduled on: Wed. 03/07/17 Report to Day Surgery. To find out your arrival time please call [phone] between 1PM - 3PM on Tues 03/06/17.  Remember: Instructions that are not followed completely may result in serious medical risk, up to and including death, or upon the discretion of your surgeon and anesthesiologist your surgery may need to be rescheduled.    _X__ 1. Do not eat food after midnight the night before your procedure. No   gum chewing or hard candies. You may drink clear liquids up to 2 hours   before you are scheduled to arrive for your surgery- DO not drink clear   liquids within 2 hours of the start of your surgery.  Clear Liquids include:   water, apple juice without pulp, clear carbohydrate drink such as    Clearfast of Gartorade, Black Coffee or Tea (Do not add anything to   coffee or tea).    _X__ 2. No Alcohol for 24 hours before or after surgery.   _X__ 3. Do Not Smoke or use e-cigarettes For 24 Hours Prior to Your Surgery.  Do not use any chewable tobacco products for at least 6   hours prior to surgery.   ____ 4. Bring all medications with you on the day of surgery if instructed.    ____ 5. Notify your doctor if there is any change in your medical condition     (cold, fever, infections).       Do not wear jewelry, make-up, hairpins, clips or nail polish.  Do not wear lotions, powders, or perfumes. You may wear deodorant.  Do not shave 48 hours prior to surgery. Men may shave face and neck.  Do not bring valuables to the hospital.    [facility] is not responsible for any belongings or valuables.               Contacts, dentures or bridgework may not be worn into surgery.  Leave your suitcase in the car. After surgery it may be brought to your room.  For patients admitted to the hospital, discharge time is determined by your                treatment team.   Patients discharged the day of surgery will not be allowed to drive  home.   Please read over the following fact sheets that you were given:      __x__ Take these medicines the morning of surgery with A SIP OF WATER:    1. sertraline (ZOLOFT) 100 MG tablet  2.   3.   4.  5.  6.  ____ Fleet Enema (as directed)   ____ Use CHG Soap as directed  ____ Use inhalers on the day of surgery  ____ Stop metformin 2 days prior to surgery    ____ Take 1/2 of usual insulin dose the night before surgery and none on the morning of surgery.   _x___ Stop aspirin on 03/01/17  ____ Stop Anti-inflammatories on    ____ Stop supplements until after surgery.    ____ Bring C-Pap to the hospital.

## 2017-03-06 LAB — CULTURE, URINE COMPREHENSIVE

## 2017-03-06 MED ORDER — CEFAZOLIN SODIUM-DEXTROSE 1-4 GM/50ML-% IV SOLN
1.0000 g | INTRAVENOUS | Status: AC
  Administered 2017-03-07: 2 g via INTRAVENOUS

## 2017-03-07 ENCOUNTER — Ambulatory Visit: Payer: Medicare Other | Admitting: Anesthesiology

## 2017-03-07 ENCOUNTER — Telehealth: Payer: Self-pay | Admitting: Urology

## 2017-03-07 ENCOUNTER — Ambulatory Visit
Admission: RE | Admit: 2017-03-07 | Discharge: 2017-03-07 | Disposition: A | Discharge status: Home / Self Care | Payer: Medicare Other | Source: Ambulatory Visit | Attending: Urology | Admitting: Urology

## 2017-03-07 ENCOUNTER — Encounter
Admission: RE | Disposition: A | Discharge status: Home / Self Care | Payer: Self-pay | Source: Ambulatory Visit | Attending: Urology

## 2017-03-07 ENCOUNTER — Encounter: Payer: Self-pay | Admitting: *Deleted

## 2017-03-07 DIAGNOSIS — Z888 Allergy status to other drugs, medicaments and biological substances status: Secondary | ICD-10-CM | POA: Insufficient documentation

## 2017-03-07 DIAGNOSIS — I272 Pulmonary hypertension, unspecified: Secondary | ICD-10-CM | POA: Insufficient documentation

## 2017-03-07 DIAGNOSIS — Z87442 Personal history of urinary calculi: Secondary | ICD-10-CM | POA: Insufficient documentation

## 2017-03-07 DIAGNOSIS — I252 Old myocardial infarction: Secondary | ICD-10-CM | POA: Insufficient documentation

## 2017-03-07 DIAGNOSIS — Z96641 Presence of right artificial hip joint: Secondary | ICD-10-CM | POA: Insufficient documentation

## 2017-03-07 DIAGNOSIS — J449 Chronic obstructive pulmonary disease, unspecified: Secondary | ICD-10-CM | POA: Insufficient documentation

## 2017-03-07 DIAGNOSIS — I088 Other rheumatic multiple valve diseases: Secondary | ICD-10-CM | POA: Insufficient documentation

## 2017-03-07 DIAGNOSIS — M1990 Unspecified osteoarthritis, unspecified site: Secondary | ICD-10-CM | POA: Insufficient documentation

## 2017-03-07 DIAGNOSIS — I509 Heart failure, unspecified: Secondary | ICD-10-CM | POA: Insufficient documentation

## 2017-03-07 DIAGNOSIS — G4733 Obstructive sleep apnea (adult) (pediatric): Secondary | ICD-10-CM | POA: Insufficient documentation

## 2017-03-07 DIAGNOSIS — N200 Calculus of kidney: Secondary | ICD-10-CM

## 2017-03-07 DIAGNOSIS — N202 Calculus of kidney with calculus of ureter: Secondary | ICD-10-CM | POA: Diagnosis not present

## 2017-03-07 DIAGNOSIS — I700 Atherosclerosis of aorta: Secondary | ICD-10-CM | POA: Insufficient documentation

## 2017-03-07 DIAGNOSIS — Z79899 Other long term (current) drug therapy: Secondary | ICD-10-CM | POA: Insufficient documentation

## 2017-03-07 DIAGNOSIS — F329 Major depressive disorder, single episode, unspecified: Secondary | ICD-10-CM | POA: Insufficient documentation

## 2017-03-07 DIAGNOSIS — Z6835 Body mass index (BMI) 35.0-35.9, adult: Secondary | ICD-10-CM | POA: Insufficient documentation

## 2017-03-07 DIAGNOSIS — I2510 Atherosclerotic heart disease of native coronary artery without angina pectoris: Secondary | ICD-10-CM | POA: Insufficient documentation

## 2017-03-07 DIAGNOSIS — I110 Hypertensive heart disease with heart failure: Secondary | ICD-10-CM | POA: Insufficient documentation

## 2017-03-07 DIAGNOSIS — Z7982 Long term (current) use of aspirin: Secondary | ICD-10-CM | POA: Insufficient documentation

## 2017-03-07 DIAGNOSIS — H409 Unspecified glaucoma: Secondary | ICD-10-CM | POA: Insufficient documentation

## 2017-03-07 DIAGNOSIS — Z955 Presence of coronary angioplasty implant and graft: Secondary | ICD-10-CM | POA: Insufficient documentation

## 2017-03-07 DIAGNOSIS — Z85828 Personal history of other malignant neoplasm of skin: Secondary | ICD-10-CM | POA: Insufficient documentation

## 2017-03-07 HISTORY — PX: STONE EXTRACTION WITH BASKET: SHX5318

## 2017-03-07 HISTORY — PX: CYSTOSCOPY/URETEROSCOPY/HOLMIUM LASER/STENT PLACEMENT: SHX6546

## 2017-03-07 SURGERY — CYSTOSCOPY/URETEROSCOPY/HOLMIUM LASER/STENT PLACEMENT
Anesthesia: General | Site: Ureter | Laterality: Right | Wound class: Clean Contaminated

## 2017-03-07 MED ORDER — EPHEDRINE SULFATE 50 MG/ML IJ SOLN
INTRAMUSCULAR | Status: DC | PRN
  Administered 2017-03-07 (×4): 10 mg via INTRAVENOUS

## 2017-03-07 MED ORDER — SUGAMMADEX SODIUM 500 MG/5ML IV SOLN
INTRAVENOUS | Status: AC
  Filled 2017-03-07: qty 5

## 2017-03-07 MED ORDER — IOTHALAMATE MEGLUMINE 43 % IV SOLN
INTRAVENOUS | Status: DC | PRN
  Administered 2017-03-07: 15 mL via URETHRAL

## 2017-03-07 MED ORDER — MEPERIDINE HCL 50 MG/ML IJ SOLN: 6.2500 mg | INTRAMUSCULAR | Status: DC | PRN

## 2017-03-07 MED ORDER — PROPOFOL 10 MG/ML IV BOLUS
INTRAVENOUS | Status: AC
  Filled 2017-03-07: qty 20

## 2017-03-07 MED ORDER — OXYCODONE HCL 5 MG/5ML PO SOLN: 5.0000 mg | Freq: Once | ORAL | Status: DC | PRN

## 2017-03-07 MED ORDER — OXYCODONE-ACETAMINOPHEN 5-325 MG PO TABS: 1.0000 | ORAL_TABLET | ORAL | 0 refills | Status: DC | PRN

## 2017-03-07 MED ORDER — OXYCODONE HCL 5 MG PO TABS: 5.0000 mg | ORAL_TABLET | Freq: Once | ORAL | Status: DC | PRN

## 2017-03-07 MED ORDER — GLYCOPYRROLATE 0.2 MG/ML IJ SOLN
INTRAMUSCULAR | Status: DC | PRN
  Administered 2017-03-07: 0.2 mg via INTRAVENOUS

## 2017-03-07 MED ORDER — ONDANSETRON HCL 4 MG/2ML IJ SOLN
INTRAMUSCULAR | Status: DC | PRN
  Administered 2017-03-07: 4 mg via INTRAVENOUS

## 2017-03-07 MED ORDER — FAMOTIDINE 20 MG PO TABS
ORAL_TABLET | ORAL | Status: AC
  Administered 2017-03-07: 20 mg via ORAL
  Filled 2017-03-07: qty 1

## 2017-03-07 MED ORDER — GLYCOPYRROLATE 0.2 MG/ML IJ SOLN
INTRAMUSCULAR | Status: AC
  Filled 2017-03-07: qty 1

## 2017-03-07 MED ORDER — FENTANYL CITRATE (PF) 100 MCG/2ML IJ SOLN
INTRAMUSCULAR | Status: AC
  Filled 2017-03-07: qty 2

## 2017-03-07 MED ORDER — LACTATED RINGERS IV SOLN
INTRAVENOUS | Status: DC
  Administered 2017-03-07: 10:00:00 via INTRAVENOUS

## 2017-03-07 MED ORDER — MIDAZOLAM HCL 2 MG/2ML IJ SOLN
INTRAMUSCULAR | Status: DC | PRN
  Administered 2017-03-07: 2 mg via INTRAVENOUS

## 2017-03-07 MED ORDER — CEFAZOLIN SODIUM 1 G IJ SOLR
INTRAMUSCULAR | Status: AC
  Filled 2017-03-07: qty 10

## 2017-03-07 MED ORDER — OXYBUTYNIN CHLORIDE 5 MG PO TABS: 5.0000 mg | ORAL_TABLET | Freq: Three times a day (TID) | ORAL | 0 refills | Status: DC | PRN

## 2017-03-07 MED ORDER — TAMSULOSIN HCL 0.4 MG PO CAPS: 0.4000 mg | ORAL_CAPSULE | Freq: Every day | ORAL | 0 refills | Status: DC

## 2017-03-07 MED ORDER — PROMETHAZINE HCL 25 MG/ML IJ SOLN: 6.2500 mg | INTRAMUSCULAR | Status: DC | PRN

## 2017-03-07 MED ORDER — FENTANYL CITRATE (PF) 100 MCG/2ML IJ SOLN
INTRAMUSCULAR | Status: DC | PRN
  Administered 2017-03-07: 100 ug via INTRAVENOUS

## 2017-03-07 MED ORDER — DEXAMETHASONE SODIUM PHOSPHATE 10 MG/ML IJ SOLN
INTRAMUSCULAR | Status: AC
  Filled 2017-03-07: qty 1

## 2017-03-07 MED ORDER — FAMOTIDINE 20 MG PO TABS
20.0000 mg | ORAL_TABLET | Freq: Once | ORAL | Status: AC
  Administered 2017-03-07: 20 mg via ORAL

## 2017-03-07 MED ORDER — ROCURONIUM BROMIDE 50 MG/5ML IV SOLN
INTRAVENOUS | Status: AC
  Filled 2017-03-07: qty 1

## 2017-03-07 MED ORDER — DEXAMETHASONE SODIUM PHOSPHATE 10 MG/ML IJ SOLN
INTRAMUSCULAR | Status: DC | PRN
  Administered 2017-03-07: 10 mg via INTRAVENOUS

## 2017-03-07 MED ORDER — SUGAMMADEX SODIUM 500 MG/5ML IV SOLN
INTRAVENOUS | Status: DC | PRN
  Administered 2017-03-07: 500 mg via INTRAVENOUS

## 2017-03-07 MED ORDER — FENTANYL CITRATE (PF) 100 MCG/2ML IJ SOLN: 25.0000 ug | INTRAMUSCULAR | Status: DC | PRN

## 2017-03-07 MED ORDER — PROPOFOL 10 MG/ML IV BOLUS
INTRAVENOUS | Status: DC | PRN
  Administered 2017-03-07: 120 mg via INTRAVENOUS

## 2017-03-07 MED ORDER — MIDAZOLAM HCL 2 MG/2ML IJ SOLN
INTRAMUSCULAR | Status: AC
  Filled 2017-03-07: qty 2

## 2017-03-07 MED ORDER — ROCURONIUM BROMIDE 100 MG/10ML IV SOLN
INTRAVENOUS | Status: DC | PRN
  Administered 2017-03-07: 20 mg via INTRAVENOUS
  Administered 2017-03-07: 10 mg via INTRAVENOUS
  Administered 2017-03-07: 40 mg via INTRAVENOUS

## 2017-03-07 MED ORDER — ONDANSETRON HCL 4 MG/2ML IJ SOLN
INTRAMUSCULAR | Status: AC
  Filled 2017-03-07: qty 2

## 2017-03-07 MED ORDER — LIDOCAINE HCL (CARDIAC) 20 MG/ML IV SOLN
INTRAVENOUS | Status: DC | PRN
  Administered 2017-03-07: 100 mg via INTRAVENOUS

## 2017-03-07 MED ORDER — LIDOCAINE HCL (PF) 2 % IJ SOLN
INTRAMUSCULAR | Status: AC
  Filled 2017-03-07: qty 10

## 2017-03-07 MED ORDER — CEFAZOLIN SODIUM-DEXTROSE 1-4 GM/50ML-% IV SOLN
INTRAVENOUS | Status: AC
  Filled 2017-03-07: qty 50

## 2017-03-07 SURGICAL SUPPLY — 32 items
ADHESIVE MASTISOL STRL (MISCELLANEOUS) ×3 IMPLANT
BAG DRAIN CYSTO-URO LG1000N (MISCELLANEOUS) ×3 IMPLANT
BASKET ZERO TIP 1.9FR (BASKET) ×3 IMPLANT
BRUSH SCRUB EZ 1% IODOPHOR (MISCELLANEOUS) ×3 IMPLANT
CATH URETL 5X70 OPEN END (CATHETERS) ×3 IMPLANT
CNTNR SPEC 2.5X3XGRAD LEK (MISCELLANEOUS) ×1
CONRAY 43 FOR UROLOGY 50M (MISCELLANEOUS) ×3 IMPLANT
CONT SPEC 4OZ STER OR WHT (MISCELLANEOUS) ×2
CONTAINER SPEC 2.5X3XGRAD LEK (MISCELLANEOUS) ×1 IMPLANT
DRAPE UTILITY 15X26 TOWEL STRL (DRAPES) ×3 IMPLANT
DRSG TEGADERM 4X4.75 (GAUZE/BANDAGES/DRESSINGS) ×3 IMPLANT
FIBER LASER LITHO 273 (Laser) ×3 IMPLANT
GLOVE BIO SURGEON STRL SZ 6.5 (GLOVE) ×4 IMPLANT
GLOVE BIO SURGEONS STRL SZ 6.5 (GLOVE) ×2
GOWN STRL REUS W/ TWL LRG LVL3 (GOWN DISPOSABLE) ×2 IMPLANT
GOWN STRL REUS W/TWL LRG LVL3 (GOWN DISPOSABLE) ×4
GUIDEWIRE GREEN .038 145CM (MISCELLANEOUS) ×3 IMPLANT
INFUSOR MANOMETER BAG 3000ML (MISCELLANEOUS) ×3 IMPLANT
INTRODUCER DILATOR DOUBLE (INTRODUCER) ×3 IMPLANT
KIT RM TURNOVER CYSTO AR (KITS) ×3 IMPLANT
PACK CYSTO AR (MISCELLANEOUS) ×3 IMPLANT
SENSORWIRE 0.038 NOT ANGLED (WIRE) ×3
SET CYSTO W/LG BORE CLAMP LF (SET/KITS/TRAYS/PACK) ×3 IMPLANT
SHEATH URETERAL 12FR 45CM (SHEATH) ×3 IMPLANT
SHEATH URETERAL 12FRX35CM (MISCELLANEOUS) IMPLANT
SOL .9 NS 3000ML IRR  AL (IV SOLUTION) ×2
SOL .9 NS 3000ML IRR UROMATIC (IV SOLUTION) ×1 IMPLANT
STENT URET 6FRX24 CONTOUR (STENTS) IMPLANT
STENT URET 6FRX26 CONTOUR (STENTS) ×3 IMPLANT
SURGILUBE 2OZ TUBE FLIPTOP (MISCELLANEOUS) ×3 IMPLANT
WATER STERILE IRR 1000ML POUR (IV SOLUTION) ×3 IMPLANT
WIRE SENSOR 0.038 NOT ANGLED (WIRE) ×1 IMPLANT

## 2017-03-07 NOTE — Interval H&P Note (Signed)
History and Physical Interval Note:  03/07/2017 10:45 AM  [name]  has presented today for surgery, with the diagnosis of Right renal stone  The various methods of treatment have been discussed with the patient and family. After consideration of risks, benefits and other options for treatment, the patient has consented to  Procedure(s): CYSTOSCOPY/URETEROSCOPY/HOLMIUM LASER/STENT PLACEMENT (Right) as a surgical intervention .  The patient's history has been reviewed, patient examined, no change in status, stable for surgery.  I have reviewed the patient's chart and labs.  Questions were answered to the patient's satisfaction.    RRR CTAB  William Armstrong

## 2017-03-07 NOTE — Op Note (Signed)
Date of procedure: 03/07/17  Preoperative diagnosis:  1.  Right kidney stone  Postoperative diagnosis:  1. Right kidney stone   Procedure: 1. Right ureteroscopy with laser lithotripsy 2. Right ureteral stent placement 3. Right retrograde pyelogram 4. Right extraction of Stone fragment via basket  Surgeon: Hollice Espy, MD  Anesthesia: General  Complications: None  Intraoperative findings: Large right approximately 1 cm stone treated along with a few smaller nonobstructing calculi in the right kidney. uncomplicated.  EBL: Minimal  Specimens: None  Drains: 6 x 26 French double-J ureteral stent on right, string left in place  Indication: William Armstrong is a 66 y.o. patient with nonobstructing right renal calculus electing intervention.  After reviewing the management options for treatment, he elected to proceed with the above surgical procedure(s). We have discussed the potential benefits and risks of the procedure, side effects of the proposed treatment, the likelihood of the patient achieving the goals of the procedure, and any potential problems that might occur during the procedure or recuperation. Informed consent has been obtained.  Description of procedure:  The patient was taken to the operating room and general anesthesia was induced.  The patient was placed in the dorsal lithotomy position, prepped and draped in the usual sterile fashion, and preoperative antibiotics were administered. A preoperative time-out was performed.   A 21 French scope was advanced per urethra into the bladder. Attention was turned to the right ureteral orifice which was cannulated using a 5 [place] open-ended ureteral catheter. A gentle retropyelogram was performed on the side showing a decompressed ureter without hydroureteronephrosis and filling the collecting system. A taut imaging, the sternal closure could be easily seen in the right lower pole. This is where his episodes level of the kidney  without difficulty. A dual lumen catheter was used to introduce the Super Stiff wire up to the collecting system. A 12/14 Cook ureteral access sheath, 45 cm was advanced to the proximal ureter without difficulty under fluoroscopic guidance over the Super Stiff wire. The inner cannula was removed. A dual lumen 8 [place] Wolf ureteroscope was then advanced to level of the renal pelvis. Each and every calyx was then inspected. There is a few small spherical nonobstructing stones in the mid pole as well as a much larger at least 1 cm stone in the right lower pole. Due to the flexion required to access the stone, a 1.9 [place] to plus spinal basket was used to mobilize the stone into an upper pole calyx or could be more easily treated. A 270  laser fiber was then brought in and using the settings of 0.2 J and 40 Hz, the stone was fragmented into at least 20-30 smaller pieces. Each of these were then extracted using a basket. This is somewhat time consuming but ultimately successful. Eventually, the entire collecting system was clear. A final larger powder was performed which showed no contrast extravasation filled the collecting system. This is then uses a roadmap to ensure that each every calyx was identified and free of any significant residual stone burden. The scope was then backed down the length the ureter removing the access sheath along the way. There were no ureteral injuries appreciated. The safety wire was backloaded over a rigid cystoscope. A 6 x 26 French double-J ureteral stent was advanced over the wire up to level of the renal pelvis. The wire was partially drawn until full coil was noted within the renal pelvis. Were simply withdrawn and a full course of the bladder. The  string was left affixed to the distal coil of the stent. This was then taped to the patient's glans using Mastisol and Tegaderm. Isn't clear dried, repositioned the supine position, reversed from anesthesia, taken to the PACU in stable  condition.  Plan: He'll remove this stent on Monday. He'll follow-up in 4 weeks with a renal ultrasound prior.  Hollice Espy, M.D.

## 2017-03-07 NOTE — Anesthesia Preprocedure Evaluation (Signed)
Anesthesia Evaluation  Patient identified by MRN, date of birth, ID band Patient awake    Reviewed: Allergy & Precautions, NPO status , Patient's Chart, lab work & pertinent test results  History of Anesthesia Complications Negative for: history of anesthetic complications  Airway Mallampati: II  TM Distance: >3 FB Neck ROM: Full    Dental no notable dental hx.    Pulmonary Sleep apnea: had repeat study after weight loss and no longer needs CPAP. , COPD,    breath sounds clear to auscultation- rhonchi (-) wheezing      Cardiovascular Exercise Tolerance: Good hypertension, + CAD, + Past MI, + Cardiac Stents (2013 RCA) and +CHF (EF 40%)   Rhythm:Regular Rate:Normal - Systolic murmurs and - Diastolic murmurs NM perfusion 03/18/15:  Myocardial perfusion imaging is Abnormal- mild moderate infarction  inferior minimal peri-infarct ischemia at the base..  Summed severity score is normal.  Artifacts noted:see above.  Overall left ventricular systolic function was Abnormal- EF 47% with  regional wall motion abnormalities (see above).  Compared to the prior study from no prior study.  Wall motion correlates with recent echo and know prior IMI and PCI to  RCA  Echo 03/18/15: Mildly concentrically hypertrophied and midly dilated left ventricle with focal hypokinesis of the inferior and inferior lateral walls. Other walls contract normally. EF mildly reduced at 40%. Mild diastolic dysfunction is present. Moderate left atrial enlargement. Thickened aortic valve with trivial stenosis. Mild regurgitation of all 4 heart valves. Mild pulmonary hypertension, with estimated systolic pressure of 01-60FUXN. Mildly dilated ascending aorta.   Neuro/Psych PSYCHIATRIC DISORDERS Depression negative neurological ROS     GI/Hepatic negative GI ROS, Neg liver ROS,   Endo/Other  negative endocrine ROSneg diabetes  Renal/GU negative Renal  ROS     Musculoskeletal  (+) Arthritis ,   Abdominal (+) + obese,   Peds  Hematology  (+) anemia ,   Anesthesia Other Findings Past Medical History: No date: Anemia No date: Arthritis No date: BCC (basal cell carcinoma of skin) No date: CHF (congestive heart failure) (HCC) No date: Complication of anesthesia     Comment:  neck hurt for 2-3 days after general anesthesia No date: COPD (chronic obstructive pulmonary disease) (HCC) No date: Coronary artery disease No date: Depression No date: Glaucoma No date: Heart attack [facility])     Comment:  2013 No date: History of kidney stones No date: HTN (hypertension) No date: Hyperglycemia     Comment:  pt denies No date: Hypertension No date: Hypokalemia No date: Obesity     Comment:  morbid No date: Sleep apnea     Comment:  osa, CPAP has been dc'd after 249 lb weight loss   Reproductive/Obstetrics                             Anesthesia Physical Anesthesia Plan  ASA: III  Anesthesia Plan: General   Post-op Pain Management:    Induction: Intravenous  PONV Risk Score and Plan: 1 and Ondansetron and Dexamethasone  Airway Management Planned: Oral ETT  Additional Equipment:   Intra-op Plan:   Post-operative Plan: Extubation in OR  Informed Consent: I have reviewed the patients History and Physical, chart, labs and discussed the procedure including the risks, benefits and alternatives for the proposed anesthesia with the patient or authorized representative who has indicated his/her understanding and acceptance.   Dental advisory given  Plan Discussed with: CRNA and Anesthesiologist  Anesthesia Plan Comments:  Anesthesia Quick Evaluation  

## 2017-03-07 NOTE — Transfer of Care (Signed)
Immediate Anesthesia Transfer of Care Note  Patient: William Armstrong  Procedure(s) Performed: CYSTOSCOPY/URETEROSCOPY/HOLMIUM LASER/STENT PLACEMENT (Right Ureter) STONE EXTRACTION WITH BASKET (Right Ureter)  Patient Location: PACU  Anesthesia Type:General  Level of Consciousness: awake, alert  and oriented  Airway & Oxygen Therapy: Patient Spontanous Breathing and Patient connected to face mask oxygen  Post-op Assessment: Report given to RN and Post -op Vital signs reviewed and stable  Post vital signs: Reviewed and stable  Last Vitals:  Vitals:   03/07/17 1226 03/07/17 1227  BP:  (!) 156/60  Pulse: 73 70  Resp: 17 17  Temp: 36.4 C 36.4 C  SpO2: 99% 99%    Last Pain:  Vitals:   03/07/17 1227  TempSrc: Temporal         Complications: No apparent anesthesia complications

## 2017-03-07 NOTE — Anesthesia Post-op Follow-up Note (Signed)
Anesthesia QCDR form completed.        

## 2017-03-07 NOTE — Anesthesia Procedure Notes (Signed)
Procedure Name: Intubation Date/Time: 03/07/2017 11:07 AM Performed by: Doreen Salvage Pre-anesthesia Checklist: Patient identified, Patient being monitored, Timeout performed, Emergency Drugs available and Suction available Patient Re-evaluated:Patient Re-evaluated prior to induction Oxygen Delivery Method: Circle system utilized Preoxygenation: Pre-oxygenation with 100% oxygen Induction Type: IV induction Ventilation: Mask ventilation without difficulty Laryngoscope Size: Mac and 3 Grade View: Grade I Tube type: Oral Tube size: 7.5 mm Number of attempts: 1 Airway Equipment and Method: Stylet Placement Confirmation: ETT inserted through vocal cords under direct vision,  positive ETCO2 and breath sounds checked- equal and bilateral Secured at: 23 cm Tube secured with: Tape Dental Injury: Teeth and Oropharynx as per pre-operative assessment

## 2017-03-07 NOTE — Anesthesia Postprocedure Evaluation (Signed)
Anesthesia Post Note  Patient: William Armstrong  Procedure(s) Performed: CYSTOSCOPY/URETEROSCOPY/HOLMIUM LASER/STENT PLACEMENT (Right Ureter) STONE EXTRACTION WITH BASKET (Right Ureter)  Patient location during evaluation: PACU Anesthesia Type: General Level of consciousness: awake and alert and oriented Pain management: pain level controlled Vital Signs Assessment: post-procedure vital signs reviewed and stable Respiratory status: spontaneous breathing, nonlabored ventilation and respiratory function stable Cardiovascular status: blood pressure returned to baseline and stable Postop Assessment: no signs of nausea or vomiting Anesthetic complications: no     Last Vitals:  Vitals:   03/07/17 1241 03/07/17 1257  BP: 125/66 (!) 128/92  Pulse: (!) 50 70  Resp: 18 18  Temp:    SpO2: 93% 93%    Last Pain:  Vitals:   03/07/17 1227  TempSrc: Temporal                 Shenita Trego

## 2017-03-07 NOTE — Telephone Encounter (Signed)
I have scheduled his app and patient has been notified  Sharyn Lull

## 2017-03-07 NOTE — H&P (View-Only) (Signed)
02/28/2017 2:15 PM   William Armstrong 1950/09/07 [id number]  Referring provider: [name], [place] [place], [place] [address]  Chief Complaint  Patient presents with  . Nephrolithiasis    HPI: 66 yo WM with a history of bilateral nephrolithiasis who presents today to discuss treatment of his right sided stones.  Background history Patient underwent  left ureteroscopy, laser lithotripsy, basket extraction of stone fragment, left retrograde pyelogram and left ureteral stent exchange for a 13 mm left distal ureteral stone and smaller renal stone.  He pulled his own stent.  RUS performed on 12/26/2016 noted no hydronephrosis, bilateral cysts and bilateral nephrolithiasis.    Patient is now 6 weeks out from hip replacement surgery and would like his right stone addressed at this time.  He is not having any flank pain or gross hematuria.  He has not passed any fragments.  He's not had any fevers, chills, nausea or vomiting.    CT Renal stone study performed on 11/2016 noted renal stones in the lower pole of the right kidney measuring 9.6 mm   His UA today was unremarkable.     PMH: Past Medical History:  Diagnosis Date  . Anemia   . Arthritis   . BCC (basal cell carcinoma of skin)   . CHF (congestive heart failure) ([place])   . Complication of anesthesia    neck hurt for 2-3 days after general anesthesia  . COPD (chronic obstructive pulmonary disease) ([place])   . Coronary artery disease   . Depression   . Glaucoma   . Heart attack ([place])    2013  . History of kidney stones   . HTN (hypertension)   . Hyperglycemia    pt denies  . Hypertension   . Hypokalemia   . Obesity    morbid  . Sleep apnea    osa, CPAP has been dc'd after 249 lb weight loss    Surgical History: Past Surgical History:  Procedure Laterality Date  . CORONARY STENT PLACEMENT  02/2012  . CYSTOSCOPY W/ RETROGRADES Left 11/18/2016   Procedure: CYSTOSCOPY WITH RETROGRADE PYELOGRAM;   Surgeon: Irine Seal, MD;  Location: [facility] ORS;  Service: Urology;  Laterality: Left;  . CYSTOSCOPY WITH STENT PLACEMENT Left 11/18/2016   Procedure: CYSTOSCOPY WITH STENT PLACEMENT;  Surgeon: Irine Seal, MD;  Location: [facility] ORS;  Service: Urology;  Laterality: Left;  . CYSTOSCOPY/URETEROSCOPY/HOLMIUM LASER/STENT PLACEMENT Left 11/29/2016   Procedure: CYSTOSCOPY/URETEROSCOPY/HOLMIUM LASER/STENT EXCHANGE;  Surgeon: Hollice Espy, MD;  Location: [facility] ORS;  Service: Urology;  Laterality: Left;  . LAPAROSCOPIC GASTRIC RESTRICTIVE DUODENAL PROCEDURE (DUODENAL SWITCH)    . LAPAROSCOPIC GASTRIC SLEEVE RESECTION  08/21/2013  . skin removal surgery     removal of extra skin approx 20 lbs  . TONSILLECTOMY AND ADENOIDECTOMY  1960  . TOTAL HIP ARTHROPLASTY Right 01/18/2017   Procedure: TOTAL HIP ARTHROPLASTY ANTERIOR APPROACH;  Surgeon: Hessie Knows, MD;  Location: [facility] ORS;  Service: Orthopedics;  Laterality: Right;    Home Medications:  Allergies as of 02/28/2017      Reactions   Atorvastatin Other (See Comments)   Muscle aches.   Pregabalin Other (See Comments)   Muscle soreness      Medication List       Accurate as of 02/28/17  2:15 PM. Always use your most recent med list.          calcium-vitamin D 500-200 MG-UNIT tablet Take 1 tablet by mouth daily.   CRESTOR 10 MG tablet Generic drug:  rosuvastatin Take 10 mg by mouth every other day.   dorzolamide 2 % ophthalmic solution Commonly known as:  TRUSOPT Place 1 drop into both eyes 3 (three) times daily.   econazole nitrate 1 % cream Apply 1 application topically 2 (two) times daily as needed (for rash).   enoxaparin 40 MG/0.4ML injection Commonly known as:  LOVENOX Inject 0.4 mLs (40 mg total) into the skin daily.   HYDROcodone-acetaminophen 5-325 MG tablet Commonly known as:  NORCO/VICODIN Take 1-2 tablets by mouth every 6 (six) hours as needed for moderate pain.   latanoprost 0.005 % ophthalmic solution Commonly known  as:  XALATAN Place 1 drop into both eyes at bedtime.   lisinopril 40 MG tablet Commonly known as:  PRINIVIL,ZESTRIL Take 40 mg by mouth daily.   multivitamin with minerals Tabs tablet Take 1 tablet by mouth daily.   neomycin-bacitracin-polymyxin ointment Commonly known as:  NEOSPORIN Apply 1 application topically 3 (three) times daily as needed for wound care.   nystatin cream Commonly known as:  MYCOSTATIN Apply 1 application topically 2 (two) times daily as needed (for rash).   oxybutynin 5 MG tablet Commonly known as:  DITROPAN Take 1 tablet (5 mg total) by mouth every 8 (eight) hours as needed for bladder spasms.   oxyCODONE 5 MG immediate release tablet Commonly known as:  Oxy IR/ROXICODONE Take 1 tablet (5 mg total) by mouth every 6 (six) hours as needed for moderate pain.   oxyCODONE 5 MG immediate release tablet Commonly known as:  Oxy IR/ROXICODONE Take 1-2 tablets (5-10 mg total) by mouth every 3 (three) hours as needed for breakthrough pain.   sertraline 100 MG tablet Commonly known as:  ZOLOFT Take 1 tablet (100 mg total) by mouth daily.   sildenafil 20 MG tablet Commonly known as:  REVATIO Take 100 mg by mouth daily as needed (for erectile dysfunction).   tamsulosin 0.4 MG Caps capsule Commonly known as:  FLOMAX Take 1 capsule (0.4 mg total) by mouth daily.   traMADol 50 MG tablet Commonly known as:  ULTRAM Take 50 mg by mouth every 6 (six) hours as needed for moderate pain.   Vitamin D 2000 units Caps Take 2,000 Units by mouth daily.   zinc gluconate 50 MG tablet Take 50 mg by mouth daily.            Discharge Care Instructions        Start     Ordered   02/28/17 0000  Urinalysis, Complete     02/28/17 1329   02/28/17 0000  CULTURE, URINE COMPREHENSIVE     02/28/17 1329   02/28/17 4696  Basic metabolic panel     29/52/84 1329   02/28/17 0000  CBC with Differential/Platelet     02/28/17 1329      Allergies:  Allergies  Allergen  Reactions  . Atorvastatin Other (See Comments)    Muscle aches.  . Pregabalin Other (See Comments)    Muscle soreness    Family History: Family History  Problem Relation Age of Onset  . Heart attack Mother   . Brain cancer Father   . Heart attack Sister   . Congenital heart disease Sister   . Leukemia Paternal Grandmother   . COPD Brother   . Heart disease Brother   . Prostate cancer Neg Hx   . Kidney cancer Neg Hx   . Bladder Cancer Neg Hx     Social History:  reports that he has never smoked. He has  never used smokeless tobacco. He reports that he does not drink alcohol or use drugs.  ROS: UROLOGY Frequent Urination?: No Hard to postpone urination?: No Burning/pain with urination?: No Get up at night to urinate?: No Leakage of urine?: No Urine stream starts and stops?: No Trouble starting stream?: No Do you have to strain to urinate?: No Blood in urine?: No Urinary tract infection?: No Sexually transmitted disease?: No Injury to kidneys or bladder?: No Painful intercourse?: No Weak stream?: No Erection problems?: No Penile pain?: No  Gastrointestinal Nausea?: No Vomiting?: No Indigestion/heartburn?: No Diarrhea?: No Constipation?: No  Constitutional Fever: No Night sweats?: No Weight loss?: No Fatigue?: No  Skin Skin rash/lesions?: No Itching?: No  Eyes Blurred vision?: No Double vision?: No  Ears/Nose/Throat Sore throat?: No Sinus problems?: No  Hematologic/Lymphatic Swollen glands?: No Easy bruising?: No  Cardiovascular Leg swelling?: No Chest pain?: No  Respiratory Cough?: No Shortness of breath?: No  Endocrine Excessive thirst?: No  Musculoskeletal Back pain?: No Joint pain?: No  Neurological Headaches?: No Dizziness?: No  Psychologic Depression?: Yes Anxiety?: No  Physical Exam: BP (!) 163/72 (BP Location: Right Arm, Patient Position: Sitting, Cuff Size: Normal)   Pulse (!) 56   Ht 5\' 7"  (1.702 m)   Wt 228 lb  14.4 oz (103.8 kg)   BMI 35.85 kg/m   Constitutional:  Alert and oriented, No acute distress.  Accompanied by wife today. HEENT: [place] AT, moist mucus membranes.  Trachea midline, no masses. Cardiovascular: No clubbing, cyanosis, or edema. Respiratory: Normal respiratory effort, no increased work of breathing. GI: Abdomen is soft, nontender, nondistended, no abdominal masses.  Obese. GU: No CVA tenderness.  Skin: No rashes, bruises or suspicious lesions. Neurologic: Grossly intact, no focal deficits, moving all 4 extremities. Psychiatric: Normal mood and affect.  Laboratory Data: Lab Results  Component Value Date   WBC 10.0 01/30/2017   HGB 12.0 (L) 01/30/2017   HCT 36.9 (L) 01/30/2017   MCV 84.0 01/30/2017   PLT 503 (H) 01/30/2017    Lab Results  Component Value Date   CREATININE 0.43 (L) 01/30/2017     Results for orders placed or performed during the hospital encounter of 03/21/50  Basic metabolic panel  Result Value Ref Range   Sodium 137 135 - 145 mmol/L   Potassium 4.1 3.5 - 5.1 mmol/L   Chloride 103 101 - 111 mmol/L   CO2 25 22 - 32 mmol/L   Glucose, Bld 111 (H) 65 - 99 mg/dL   BUN 17 6 - 20 mg/dL   Creatinine, Ser 0.43 (L) 0.61 - 1.24 mg/dL   Calcium 9.2 8.9 - 10.3 mg/dL   GFR calc non Af Amer >60 >60 mL/min   GFR calc Af Amer >60 >60 mL/min   Anion gap 9 5 - 15  CBC  Result Value Ref Range   WBC 10.0 3.8 - 10.6 K/uL   RBC 4.39 (L) 4.40 - 5.90 MIL/uL   Hemoglobin 12.0 (L) 13.0 - 18.0 g/dL   HCT 36.9 (L) 40.0 - 52.0 %   MCV 84.0 80.0 - 100.0 fL   MCH 27.3 26.0 - 34.0 pg   MCHC 32.5 32.0 - 36.0 g/dL   RDW 15.0 (H) 11.5 - 14.5 %   Platelets 503 (H) 150 - 440 K/uL  Urinalysis, Complete w Microscopic  Result Value Ref Range   Color, Urine YELLOW (A) YELLOW   APPearance HAZY (A) CLEAR   Specific Gravity, Urine 1.017 1.005 - 1.030   pH 5.0 5.0 -  8.0   Glucose, UA NEGATIVE NEGATIVE mg/dL   Hgb urine dipstick NEGATIVE NEGATIVE   Bilirubin Urine NEGATIVE  NEGATIVE   Ketones, ur NEGATIVE NEGATIVE mg/dL   Protein, ur NEGATIVE NEGATIVE mg/dL   Nitrite NEGATIVE NEGATIVE   Leukocytes, UA NEGATIVE NEGATIVE   RBC / HPF 0-5 0 - 5 RBC/hpf   WBC, UA 0-5 0 - 5 WBC/hpf   Bacteria, UA NONE SEEN NONE SEEN   Squamous Epithelial / LPF 0-5 (A) NONE SEEN   Mucus PRESENT    Ca Oxalate Crys, UA PRESENT   Troponin I  Result Value Ref Range   Troponin I <0.03 <0.03 ng/mL  Lipase, blood  Result Value Ref Range   Lipase 20 11 - 51 U/L  Troponin I  Result Value Ref Range   Troponin I <0.03 <0.03 ng/mL   Urinalysis Unremarkable.  See EPIC.    I have reviewed the labs  Pertinent Imaging: CLINICAL DATA:  Left flank pain.  Hematuria.  EXAM: CT ABDOMEN AND PELVIS WITHOUT CONTRAST  TECHNIQUE: Multidetector CT imaging of the abdomen and pelvis was performed following the standard protocol without IV contrast.  COMPARISON:  Ultrasound 10/16/2016  FINDINGS: Lower chest: Advanced calcific atherosclerotic disease of the coronary arteries. Mildly enlarged heart.  Hepatobiliary: 2.2 cm left hepatic cyst. A second 1 cm hypoattenuated lesion in the dome of the liver, too small to be actually characterize by CT.  Pancreas: Somewhat atrophic.  Spleen: Normal in size without focal abnormality.  Adrenals/Urinary Tract: Normal adrenal glands. Bilateral nephrolithiasis, with renal stones in the lower pole of the right kidney measuring 9.6 mm and lower pole of the left kidney measuring 9 mm. No evidence of right hydronephrosis. Moderate left hydronephrosis and perinephric fat stranding. Moderate left hydroureter. Fat stranding along the dilated ureter noted as well. Obstructive 13 mm stone in the distal left ureter. Bulbous dilation of the left ureter just upstream to the level of obstruction with small amount of periureteral free fluid.  Stomach/Bowel: No evidence of bowel wall thickening, distention, or inflammatory changes. Postsurgical  changes from gastric bypass.  Vascular/Lymphatic: Aortic atherosclerosis. No enlarged abdominal or pelvic lymph nodes.  Reproductive: Prostate is unremarkable.  Other: Longitudinal fluid collection along the superficial fascia of the right rectus abdominus measures approximately 14 cm in craniocaudal dimension. Fat stranding in the right flank and right anterior abdominal wall may be postsurgical.  Musculoskeletal: Multilevel osteoarthritic changes and posterior facet arthropathy of the lumbosacral spine.  IMPRESSION: Left obstructive uropathy caused by 13 mm distal left ureteral stone. Bulbous dilation of the left ureter just upstream to the level of the obstruction with a small amount of periureteral free fluid. These may represent postobstructive inflammatory changes versus micro rupture.  Bilateral nephrolithiasis.  Longitudinal fluid collection within the right paramedian anterior abdominal wall, measuring 14 cm in craniocaudal dimension. Please correlate to clinical exam findings.  1 cm hypoattenuated lesion within the dome of the liver, incompletely characterized.  Calcific atherosclerotic disease of the aorta.   Electronically Signed   By: Fidela Salisbury M.D.   On: 11/18/2016 16:17  CLINICAL DATA:  History of lithotripsy for left-sided ureteral stone on November 29, 2016  EXAM: RENAL / URINARY TRACT ULTRASOUND COMPLETE  COMPARISON:  CT scan of the abdomen and pelvis of November 18, 2016.  FINDINGS: Right Kidney:  Length: 14 cm. Multiple kidney stones are observed with the largest lying in the midpole measuring 10 mm in diameter. There is an upper pole cyst measuring 3.1 x 2.4  x 1.7 cm. There is a lower pole cyst measuring 2.3 x 1.5 x 2 cm. The renal cortical echotexture remains lower than that of the liver.  Left Kidney:  Length: 13.4 cm. There is a lower pole stone measuring 9 mm in greatest dimension. There is an upper pole cyst  measuring 1.2 x 1.1 x 1.1 cm. There is no hydronephrosis. The renal cortical echotexture is similar to that on the right.  Bladder:  The urinary bladder is only partially distended.  IMPRESSION: There is no hydronephrosis. There bilateral kidney stones and simple appearing cysts.   Electronically Signed   By: David  Martinique M.D.   On: 12/26/2016 11:33   Assessment & Plan:    Patient would like to have right ureteroscopy with laser lithotripsy with ureteral stent placement for definitive treatment of his 9 mm right renal stone.    1. Right renal stone  - schedule right ureteroscopy with laser lithotripsy and ureteral stent placement  - explained to the patient how the procedure is performed and the risks involved  - informed patient that they will have a stent placed during the procedure and will remain in place after the procedure for a short time.   - stent may be removed in the office with a cystoscope or patient may be instructed to remove the stent themselves by the string  - described "stent pain" as feelings of needing to urinate/overactive bladder and a warm, tingling sensation to intense pain in the affected flank  - residual stones within the kidney or ureter may be present after the procedure and may need to have these addressed at a different encounter  - injury to the ureter is the most common intra-operative risk, it may result in an open procedure to correct the defect  - infection and bleeding are also risks  - explained the risks of general anesthesia, such as: MI, CVA, paralysis, coma and/or death.  - advised to contact our office or seek treatment in the ED if becomes febrile or pain/ vomiting are difficult control in order to arrange for emergent/urgent intervention  Zara Council, PA-C  [facility] [address], [place], [place] [address] [phone]

## 2017-03-07 NOTE — Discharge Instructions (Signed)
AMBULATORY SURGERY  DISCHARGE INSTRUCTIONS  1) The drugs that you were given will stay in your system until tomorrow so for the next 24 hours you should not: A) Drive an automobile B) Make any legal decisions C) Drink any alcoholic beverage  2) You may resume regular meals tomorrow.  Today it is better to start with liquids and gradually work up to solid foods. You may eat anything you prefer, but it is better to start with liquids, then soup and crackers, and gradually work up to solid foods.  3) Please notify your doctor immediately if you have any unusual bleeding, trouble breathing, redness and pain at the surgery site, drainage, fever, or pain not relieved by medication.  4) Additional Instructions:  Please contact your physician with any problems or Same Day Surgery at [phone], Monday through Friday 6 am to 4 pm, or [place] at [facility] number at [phone].  You have a ureteral stent in place.  This is a tube that extends from your kidney to your bladder.  This may cause urinary bleeding, burning with urination, and urinary frequency.  Please call our office or present to the ED if you develop fevers >101 or pain which is not able to be controlled with oral pain medications.  You may be given either Flomax and/ or ditropan to help with bladder spasms and stent pain in addition to pain medications.    You may remove your stent on Monday.    [facility] [address] [place], [place] [address] [phone]

## 2017-03-08 ENCOUNTER — Telehealth: Payer: Self-pay

## 2017-03-08 NOTE — Telephone Encounter (Signed)
Pt called c/o frequency and gross hematuria post stent placement. Reinforced with pt the purpose of the stent and therefore frequency is normal. Also reinforced with pt to drink plenty of fluids to help flush the blood. Pt voiced understanding of whole conversation.

## 2017-03-13 ENCOUNTER — Ambulatory Visit
Admission: RE | Admit: 2017-03-13 | Discharge: 2017-03-13 | Disposition: A | Discharge status: Home / Self Care | Payer: Medicare Other | Source: Ambulatory Visit | Attending: Urology | Admitting: Urology

## 2017-03-13 DIAGNOSIS — N200 Calculus of kidney: Secondary | ICD-10-CM | POA: Diagnosis not present

## 2017-03-13 DIAGNOSIS — N281 Cyst of kidney, acquired: Secondary | ICD-10-CM | POA: Insufficient documentation

## 2017-04-04 ENCOUNTER — Ambulatory Visit (INDEPENDENT_AMBULATORY_CARE_PROVIDER_SITE_OTHER): Payer: Medicare Other | Admitting: Urology

## 2017-04-04 ENCOUNTER — Encounter: Payer: Self-pay | Admitting: Urology

## 2017-04-04 VITALS — BP 120/66 | HR 82 | Ht 67.0 in | Wt 225.0 lb

## 2017-04-04 DIAGNOSIS — N200 Calculus of kidney: Secondary | ICD-10-CM

## 2017-04-04 DIAGNOSIS — Z87442 Personal history of urinary calculi: Secondary | ICD-10-CM | POA: Diagnosis not present

## 2017-04-04 NOTE — Progress Notes (Signed)
04/04/2017 1:06 PM   [name] 1951/01/19 [id number]  Referring provider: Madelyn Brunner, MD [facility] [place], [place] [address]  Chief Complaint  Patient presents with  . RUS    4wk    HPI: 66 year old male with bilateral nephrolithiasis who is undergone staged ureteroscopy, initially on the left side for treatment of an obstructing ureteral calculus followed by more recent right ureteroscopy for a 1 cm nonobstructing stone on 03/07/2017.  He pulled his own stent postoperatively.  He follows up today for routine follow-up.  He has been doing very well since surgery.  He has had no complications.  He denies any flank pain or urinary symptoms.  Follow-up renal ultrasound shows no residual hydronephrosis bilaterally.  He does have echogenic material in his right kidney likely representing stone debris.  He has bilateral simple cysts.  Stone analysis shows 90% calcium oxalate monohydrate, 5% calcium oxalate dihydrate, 5% calcium phosphate.  Prior to these 2 surgeries this year, his last stone episode was greater than 40 years ago.  He does have a personal history of gastric bypass surgery.  He also has been taking calcium and vitamin D supplements since his surgery.  PMH: Past Medical History:  Diagnosis Date  . Anemia   . Arthritis   . BCC (basal cell carcinoma of skin)   . CHF (congestive heart failure) ([employer])   . Complication of anesthesia    neck hurt for 2-3 days after general anesthesia  . COPD (chronic obstructive pulmonary disease) ([place])   . Coronary artery disease   . Depression   . Glaucoma   . Heart attack ([place])    2013  . History of kidney stones   . HTN (hypertension)   . Hyperglycemia    pt denies  . Hypertension   . Hypokalemia   . Obesity    morbid  . Sleep apnea    osa, CPAP has been dc'd after 249 lb weight loss    Surgical History: Past Surgical History:  Procedure Laterality Date  . CORONARY STENT  PLACEMENT  02/2012  . CYSTOSCOPY W/ RETROGRADES Left 11/18/2016   Procedure: CYSTOSCOPY WITH RETROGRADE PYELOGRAM;  Surgeon: Irine Seal, MD;  Location: [facility];  Service: Urology;  Laterality: Left;  . CYSTOSCOPY WITH STENT PLACEMENT Left 11/18/2016   Procedure: CYSTOSCOPY WITH STENT PLACEMENT;  Surgeon: Irine Seal, MD;  Location: [facility];  Service: Urology;  Laterality: Left;  . CYSTOSCOPY/URETEROSCOPY/HOLMIUM LASER/STENT PLACEMENT Left 11/29/2016   Procedure: CYSTOSCOPY/URETEROSCOPY/HOLMIUM LASER/STENT EXCHANGE;  Surgeon: Hollice Espy, MD;  Location: [facility];  Service: Urology;  Laterality: Left;  . CYSTOSCOPY/URETEROSCOPY/HOLMIUM LASER/STENT PLACEMENT Right 03/07/2017   Procedure: CYSTOSCOPY/URETEROSCOPY/HOLMIUM LASER/STENT PLACEMENT;  Surgeon: Hollice Espy, MD;  Location: [facility];  Service: Urology;  Laterality: Right;  . JOINT REPLACEMENT     right hip  . LAPAROSCOPIC GASTRIC RESTRICTIVE DUODENAL PROCEDURE (DUODENAL SWITCH)    . LAPAROSCOPIC GASTRIC SLEEVE RESECTION  08/21/2013  . skin removal surgery     removal of extra skin approx 20 lbs  . STONE EXTRACTION WITH BASKET Right 03/07/2017   Procedure: STONE EXTRACTION WITH BASKET;  Surgeon: Hollice Espy, MD;  Location: [facility];  Service: Urology;  Laterality: Right;  . TONSILLECTOMY AND ADENOIDECTOMY  1960  . TOTAL HIP ARTHROPLASTY Right 01/18/2017   Procedure: TOTAL HIP ARTHROPLASTY ANTERIOR APPROACH;  Surgeon: Hessie Knows, MD;  Location: [facility];  Service: Orthopedics;  Laterality: Right;    Home Medications:  Allergies as of 04/04/2017  Reactions   Atorvastatin Other (See Comments)   Muscle aches.   Pregabalin Other (See Comments)   Muscle soreness      Medication List       Accurate as of 04/04/17  1:06 PM. Always use your most recent med list.          aspirin EC 81 MG tablet Take 81 mg by mouth daily.   CRESTOR 10 MG tablet Generic drug:  rosuvastatin Take 10 mg by mouth every other day.     dorzolamide 2 % ophthalmic solution Commonly known as:  TRUSOPT Place 1 drop into both eyes 3 (three) times daily.   econazole nitrate 1 % cream Apply 1 application topically 2 (two) times daily as needed (for rash).   latanoprost 0.005 % ophthalmic solution Commonly known as:  XALATAN Place 1 drop into both eyes at bedtime.   lisinopril 40 MG tablet Commonly known as:  PRINIVIL,ZESTRIL Take 40 mg by mouth daily.   multivitamin with minerals Tabs tablet Take 1 tablet by mouth daily.   nystatin cream Commonly known as:  MYCOSTATIN Apply 1 application topically 2 (two) times daily as needed (for rash).   sertraline 100 MG tablet Commonly known as:  ZOLOFT Take 1 tablet (100 mg total) by mouth daily.   sildenafil 20 MG tablet Commonly known as:  REVATIO Take 100 mg by mouth daily as needed (for erectile dysfunction).   tamsulosin 0.4 MG Caps capsule Commonly known as:  FLOMAX Take 1 capsule (0.4 mg total) by mouth daily.   Vitamin D 2000 units Caps Take 2,000 Units by mouth daily.   zinc gluconate 50 MG tablet Take 50 mg by mouth daily.       Allergies:  Allergies  Allergen Reactions  . Atorvastatin Other (See Comments)    Muscle aches.  . Pregabalin Other (See Comments)    Muscle soreness    Family History: Family History  Problem Relation Age of Onset  . Heart attack Mother   . Brain cancer Father   . Heart attack Sister   . Congenital heart disease Sister   . Leukemia Paternal Grandmother   . COPD Brother   . Heart disease Brother   . Prostate cancer Neg Hx   . Kidney cancer Neg Hx   . Bladder Cancer Neg Hx     Social History:  reports that he has never smoked. He has never used smokeless tobacco. He reports that he does not drink alcohol or use drugs.  ROS: UROLOGY Frequent Urination?: No Hard to postpone urination?: No Burning/pain with urination?: No Get up at night to urinate?: No Leakage of urine?: No Urine stream starts and stops?:  No Trouble starting stream?: No Do you have to strain to urinate?: No Blood in urine?: No Urinary tract infection?: No Sexually transmitted disease?: No Injury to kidneys or bladder?: No Painful intercourse?: No Weak stream?: No Erection problems?: No Penile pain?: No  Gastrointestinal Nausea?: No Vomiting?: No Indigestion/heartburn?: No Diarrhea?: No Constipation?: No  Constitutional Fever: No Night sweats?: No Weight loss?: No Fatigue?: No  Skin Skin rash/lesions?: No Itching?: No  Eyes Blurred vision?: No Double vision?: No  Ears/Nose/Throat Sore throat?: No Sinus problems?: No  Hematologic/Lymphatic Swollen glands?: No Easy bruising?: No  Cardiovascular Leg swelling?: No Chest pain?: No  Respiratory Cough?: No Shortness of breath?: No  Endocrine Excessive thirst?: No  Musculoskeletal Back pain?: No Joint pain?: No  Neurological Headaches?: No Dizziness?: No  Psychologic Depression?: No Anxiety?: No  Physical Exam: BP 120/66   Pulse 82   Ht 5\' 7"  (1.702 m)   Wt 225 lb (102.1 kg)   BMI 35.24 kg/m   Constitutional:  Alert and oriented, No acute distress. HEENT: [place] AT, moist mucus membranes.  Trachea midline, no masses. Cardiovascular: No clubbing, cyanosis, or edema. Respiratory: Normal respiratory effort, no increased work of breathing. GI: Abdomen is soft, nontender, nondistended, no abdominal masses GU: No CVA tenderness.  Skin: No rashes, bruises or suspicious lesions. Neurologic: Grossly intact, no focal deficits, moving all 4 extremities. Psychiatric: Normal mood and affect.  Laboratory Data: Lab Results  Component Value Date   WBC 6.7 02/28/2017   HGB 12.2 (L) 02/28/2017   HCT 38.3 02/28/2017   MCV 86 02/28/2017   PLT 237 02/28/2017    Lab Results  Component Value Date   CREATININE 0.54 (L) 02/28/2017    Lab Results  Component Value Date   HGBA1C 5.7 01/21/2013    Urinalysis Lab Results  Component Value  Date   SPECGRAV 1.015 02/28/2017   PHUR 5.0 02/28/2017   COLORU Yellow 02/28/2017   APPEARANCEUR Clear 02/28/2017   LEUKOCYTESUR Negative 02/28/2017   PROTEINUR Negative 02/28/2017   GLUCOSEU Negative 02/28/2017   KETONESU Negative 02/28/2017   RBCU Negative 02/28/2017   BILIRUBINUR Negative 02/28/2017   UUROB 0.2 02/28/2017   NITRITE Negative 02/28/2017    Lab Results  Component Value Date   LABMICR See below: 11/28/2016   WBCUA 6-10 (A) 11/28/2016   RBCUA >30 (H) 11/28/2016   LABEPIT None seen 11/28/2016   BACTERIA NONE SEEN 01/30/2017    Pertinent Imaging: CLINICAL DATA:  66 y/o male with bilateral nephrolithiasis. Status post Ureteroscopy this month.  EXAM: RENAL / URINARY TRACT ULTRASOUND COMPLETE  COMPARISON:  Renal ultrasound 12/26/2016. CT Abdomen and Pelvis 11/18/2016.  FINDINGS: Right Kidney:  Length: 13.9 cm. Echogenicity within normal limits. No solid mass or hydronephrosis visualized. Multiple echogenic foci compatible with nephrolithiasis. Stable right renal simple cysts (2.4 cm upper pole, 8 mm midpole, 2.3 cm lower pole).  Left Kidney:  Length: 15.1 cm. Echogenicity within normal limits. No solid mass or hydronephrosis visualized. Small left renal upper pole simple cyst 10 mm.  Bladder:  Appears normal for degree of bladder distention. Both ureteral jets are detected with Doppler.  IMPRESSION: 1. No acute renal findings.  Negative urinary bladder. 2. Right nephrolithiasis is evident by ultrasound. Incidental bilateral simple renal cysts.   Electronically Signed   By: Genevie Ann M.D.   On: 03/14/2017 14:12  Renal ultrasound personally reviewed today.  Assessment & Plan:    1. History of kidney stones Status post bilateral ureteroscopy this year Currently asymptomatic without hydronephrosis Stone analysis reviewed with patient Risk factors for stones also discussed with patient, advised to discuss with cessation of calcium  supplementation with bariatric surgeon  Offered [id number] urine metabolic workup, declined but will reserve if he develops further stone episodes We discussed various treatment options including ESWL vs. ureteroscopy, laser lithotripsy, and stent. We discussed the risks and benefits of both including bleeding, infection, damage to surrounding structures, efficacy with need for possible further intervention, and need for temporary ureteral stent. F/u in KUB in 6 months - DG Abd 1 View; Future  2. Right kidney stone Likely residual stone debris from ureteroscopy Recs as above   Return in about 6 months (around 10/02/2017) for KUB.  Hollice Espy, MD  [facility] [address] [place], [place] [address] [phone]

## 2017-04-18 ENCOUNTER — Other Ambulatory Visit: Payer: Self-pay | Admitting: Urology

## 2017-04-20 ENCOUNTER — Encounter: Payer: Self-pay | Admitting: Urology

## 2017-04-23 ENCOUNTER — Encounter: Payer: Self-pay | Admitting: Urology

## 2017-04-25 ENCOUNTER — Ambulatory Visit: Admission: RE | Admit: 2017-04-25 | Payer: Medicare Other | Source: Ambulatory Visit

## 2017-06-13 ENCOUNTER — Other Ambulatory Visit: Payer: Self-pay | Admitting: Family Medicine

## 2017-06-13 NOTE — Telephone Encounter (Signed)
[facility] faxed a refill request for the following medication. Thanks CC  sertraline (ZOLOFT) 100 MG tablet

## 2017-07-18 DIAGNOSIS — N400 Enlarged prostate without lower urinary tract symptoms: Secondary | ICD-10-CM | POA: Insufficient documentation

## 2017-08-07 ENCOUNTER — Other Ambulatory Visit: Payer: Self-pay | Admitting: Family Medicine

## 2017-09-19 ENCOUNTER — Telehealth: Payer: Self-pay

## 2017-09-19 NOTE — Telephone Encounter (Signed)
Called pt to see about scheduling his AWV and CPE after 10/10/17. Wife answered and said pt was unavailable currently. Advised wife of reason for call and she states she will speak with husband and CB to schedule apts.  -MM

## 2017-09-25 NOTE — Telephone Encounter (Signed)
Pt states he already had his wellness visit with Dr Harrel Lemon at [facility] in 07/2017. Unable to schedule AWV or CPE. -MM

## 2017-09-25 NOTE — Telephone Encounter (Signed)
Patient had called office to arrange AWV and CPE I looked at your schedule for the month of May it is says no schedule found. Can you please look and call patient. KW

## 2017-10-01 ENCOUNTER — Ambulatory Visit
Admission: RE | Admit: 2017-10-01 | Discharge: 2017-10-01 | Disposition: A | Discharge status: Home / Self Care | Payer: Medicare Other | Source: Ambulatory Visit | Attending: Urology | Admitting: Urology

## 2017-10-01 DIAGNOSIS — Z87442 Personal history of urinary calculi: Secondary | ICD-10-CM

## 2017-10-01 DIAGNOSIS — N200 Calculus of kidney: Secondary | ICD-10-CM | POA: Insufficient documentation

## 2017-10-02 ENCOUNTER — Ambulatory Visit (INDEPENDENT_AMBULATORY_CARE_PROVIDER_SITE_OTHER): Payer: Medicare Other | Admitting: Urology

## 2017-10-02 ENCOUNTER — Encounter: Payer: Self-pay | Admitting: Urology

## 2017-10-02 VITALS — BP 154/72 | HR 53 | Ht 67.0 in | Wt 235.0 lb

## 2017-10-02 DIAGNOSIS — N200 Calculus of kidney: Secondary | ICD-10-CM | POA: Diagnosis not present

## 2017-10-02 NOTE — Progress Notes (Signed)
10/02/2017 11:16 AM   William Armstrong 02/01/1951 [id number]  Referring provider: Baxter Hire, MD [place], [place] [address]  Chief Complaint  Patient presents with  . Nephrolithiasis    60month w/KUB    HPI: 67 year old male who returns for six-month follow-up of nephrolithiasis.  He underwent staged bilateral ureteroscopy last year for treatment of bilateral ureteral and nonobstructing stones.  Prior to today, his most recent imaging in the form of renal ultrasound showed some small right-sided stones, otherwise was unremarkable.  KUB today shows 2 stones, 7 mm and 2 mm in the left lower pole.  These are apparently new since his last visit.  Over the past 6 months, he is passed 3 or 4 stone spontaneously.  He had no associated pain with this.  He reports that he just happened to see stones in the toilet.  He is been trying to drink plenty water, greater than a half a gallon per day.  Stone analysis shows 90% calcium oxalate monohydrate, 5% calcium oxalate dihydrate, 5% calcium phosphate.  Prior to these 2 surgeries this year, his last stone episode was greater than 40 years ago.  He does have a personal history of gastric sleeve surgery He also has been taking calcium and vitamin D supplements since his surgery.   PMH: Past Medical History:  Diagnosis Date  . Anemia   . Arthritis   . BCC (basal cell carcinoma of skin)   . CHF (congestive heart failure) ([place])   . Complication of anesthesia    neck hurt for 2-3 days after general anesthesia  . COPD (chronic obstructive pulmonary disease) ([place])   . Coronary artery disease   . Depression   . Glaucoma   . Heart attack ([place])    2013  . History of kidney stones   . HTN (hypertension)   . Hyperglycemia    pt denies  . Hypertension   . Hypokalemia   . Obesity    morbid  . Sleep apnea    osa, CPAP has been dc'd after 249 lb weight loss    Surgical History: Past Surgical History:  Procedure  Laterality Date  . CORONARY STENT PLACEMENT  02/2012  . CYSTOSCOPY W/ RETROGRADES Left 11/18/2016   Procedure: CYSTOSCOPY WITH RETROGRADE PYELOGRAM;  Surgeon: Irine Seal, MD;  Location: [facility];  Service: Urology;  Laterality: Left;  . CYSTOSCOPY WITH STENT PLACEMENT Left 11/18/2016   Procedure: CYSTOSCOPY WITH STENT PLACEMENT;  Surgeon: Irine Seal, MD;  Location: [facility];  Service: Urology;  Laterality: Left;  . CYSTOSCOPY/URETEROSCOPY/HOLMIUM LASER/STENT PLACEMENT Left 11/29/2016   Procedure: CYSTOSCOPY/URETEROSCOPY/HOLMIUM LASER/STENT EXCHANGE;  Surgeon: Hollice Espy, MD;  Location: [facility];  Service: Urology;  Laterality: Left;  . CYSTOSCOPY/URETEROSCOPY/HOLMIUM LASER/STENT PLACEMENT Right 03/07/2017   Procedure: CYSTOSCOPY/URETEROSCOPY/HOLMIUM LASER/STENT PLACEMENT;  Surgeon: Hollice Espy, MD;  Location: [facility];  Service: Urology;  Laterality: Right;  . JOINT REPLACEMENT     right hip  . LAPAROSCOPIC GASTRIC RESTRICTIVE DUODENAL PROCEDURE (DUODENAL SWITCH)    . LAPAROSCOPIC GASTRIC SLEEVE RESECTION  08/21/2013  . skin removal surgery     removal of extra skin approx 20 lbs  . STONE EXTRACTION WITH BASKET Right 03/07/2017   Procedure: STONE EXTRACTION WITH BASKET;  Surgeon: Hollice Espy, MD;  Location: [facility];  Service: Urology;  Laterality: Right;  . TONSILLECTOMY AND ADENOIDECTOMY  1960  . TOTAL HIP ARTHROPLASTY Right 01/18/2017   Procedure: TOTAL HIP ARTHROPLASTY ANTERIOR APPROACH;  Surgeon: Hessie Knows, MD;  Location: [facility];  Service: Orthopedics;  Laterality: Right;    Home Medications:  Allergies as of 10/02/2017      Reactions   Atorvastatin Other (See Comments)   Muscle aches.   Pregabalin Other (See Comments)   Muscle soreness      Medication List        Accurate as of 10/02/17 11:16 AM. Always use your most recent med list.          aspirin EC 81 MG tablet Take 81 mg by mouth daily.   CRESTOR 10 MG tablet Generic drug:  rosuvastatin Take 10 mg  by mouth every other day.   dorzolamide 2 % ophthalmic solution Commonly known as:  TRUSOPT Place 1 drop into both eyes 3 (three) times daily.   econazole nitrate 1 % cream Apply 1 application topically 2 (two) times daily as needed (for rash).   latanoprost 0.005 % ophthalmic solution Commonly known as:  XALATAN Place 1 drop into both eyes at bedtime.   lisinopril 40 MG tablet Commonly known as:  PRINIVIL,ZESTRIL Take 40 mg by mouth daily.   multivitamin with minerals Tabs tablet Take 1 tablet by mouth daily.   nystatin cream Commonly known as:  MYCOSTATIN Apply 1 application topically 2 (two) times daily as needed (for rash).   sertraline 100 MG tablet Commonly known as:  ZOLOFT TAKE 1 TABLET BY MOUTH ONCE DAILY   sildenafil 20 MG tablet Commonly known as:  REVATIO Take 100 mg by mouth daily as needed (for erectile dysfunction).   tamsulosin 0.4 MG Caps capsule Commonly known as:  FLOMAX TAKE 1 CAPSULE BY MOUTH ONCE DAILY   Vitamin D 2000 units Caps Take 2,000 Units by mouth daily.   zinc gluconate 50 MG tablet Take 50 mg by mouth daily.       Allergies:  Allergies  Allergen Reactions  . Atorvastatin Other (See Comments)    Muscle aches.  . Pregabalin Other (See Comments)    Muscle soreness    Family History: Family History  Problem Relation Age of Onset  . Heart attack Mother   . Brain cancer Father   . Heart attack Sister   . Congenital heart disease Sister   . Leukemia Paternal Grandmother   . COPD Brother   . Heart disease Brother   . Prostate cancer Neg Hx   . Kidney cancer Neg Hx   . Bladder Cancer Neg Hx     Social History:  reports that he has never smoked. He has never used smokeless tobacco. He reports that he does not drink alcohol or use drugs.  ROS: UROLOGY Frequent Urination?: No Hard to postpone urination?: No Burning/pain with urination?: No Get up at night to urinate?: No Leakage of urine?: No Urine stream starts and  stops?: No Trouble starting stream?: No Do you have to strain to urinate?: No Blood in urine?: No Urinary tract infection?: No Sexually transmitted disease?: No Injury to kidneys or bladder?: No Painful intercourse?: No Weak stream?: No Erection problems?: No Penile pain?: No  Gastrointestinal Nausea?: No Vomiting?: No Indigestion/heartburn?: No Diarrhea?: No Constipation?: No  Constitutional Fever: No Night sweats?: No Weight loss?: No Fatigue?: No  Skin Skin rash/lesions?: No Itching?: No  Eyes Blurred vision?: No Double vision?: No  Ears/Nose/Throat Sore throat?: No Sinus problems?: No  Hematologic/Lymphatic Swollen glands?: No Easy bruising?: No  Cardiovascular Leg swelling?: No Chest pain?: No  Respiratory Cough?: No Shortness of breath?: No  Endocrine Excessive thirst?: No  Musculoskeletal Back pain?: No Joint  pain?: No  Neurological Headaches?: No Dizziness?: No  Psychologic Depression?: No Anxiety?: No  Physical Exam: BP (!) 154/72   Pulse (!) 53   Ht 5\' 7"  (1.702 m)   Wt 235 lb (106.6 kg)   BMI 36.81 kg/m   Constitutional:  Alert and oriented, No acute distress.  Accompanied by wife today. HEENT: [place] AT, moist mucus membranes.  Trachea midline, no masses. Cardiovascular: No clubbing, cyanosis, or edema. Respiratory: Normal respiratory effort, no increased work of breathing.. Skin: No rashes, bruises or suspicious lesions.  Abundant loose skin appreciated. Neurologic: Grossly intact, no focal deficits, moving all 4 extremities. Psychiatric: Normal mood and affect.  Laboratory Data: Lab Results  Component Value Date   WBC 6.7 02/28/2017   HGB 12.2 (L) 02/28/2017   HCT 38.3 02/28/2017   MCV 86 02/28/2017   PLT 237 02/28/2017    Lab Results  Component Value Date   CREATININE 0.54 (L) 02/28/2017     Lab Results  Component Value Date   HGBA1C 5.7 01/21/2013    Urinalysis n/a  Pertinent Imaging: Results for  orders placed during the hospital encounter of 10/01/17  DG Abd 1 View   Narrative CLINICAL DATA:  Follow-up for kidney stones status post laser treatment in October. Currently asymptomatic.  EXAM: ABDOMEN - 1 VIEW  COMPARISON:  CT abdomen pelvis dated November 18, 2016.  FINDINGS: Bowel gas pattern is normal. Bowel anastomotic sutures are again noted in the right abdomen. Calculi in the lower pole of the left kidney are unchanged. No definite right renal calculi. No definite ureteral calculi. Unchanged phleboliths in the pelvis. No acute osseous abnormality. Partially visualized right hip arthroplasty.  IMPRESSION: 1. Unchanged left renal calculi.   Electronically Signed   By: Titus Dubin M.D.   On: 10/01/2017 15:23    KUB personally reviewed today.   2 stones are seen in the left lower pole, 7 mm as well as a 3 mm stone in close proximity.  This was directly compared to a renal ultrasound 6 months ago on 03/2017 showing only right-sided stone burden.   Assessment & Plan:    1. Recurrent nephrolithiasis Recommend [id number] urine metabolic work-up Briefly reviewed stone prevention recommendations Stones likely related to bariatric surgery - DG Abd 1 View; Future  2. Kidney stone on left side New left-sided stone burden over the past 6 months Discussed options for treatment versus observation He is interested in observation at this time We will reassess in 3 months with KUB to assess for growth Return sooner if develops flank pain or hematuria   Return in about 3 months (around 01/01/2018) for review 24 hour urine results, KUB.  Hollice Espy, MD  [facility] [address] [place], [place] [address] [phone]

## 2017-10-09 ENCOUNTER — Other Ambulatory Visit: Payer: Medicare Other

## 2017-10-16 ENCOUNTER — Other Ambulatory Visit: Payer: Self-pay | Admitting: Urology

## 2017-11-03 ENCOUNTER — Other Ambulatory Visit: Payer: Self-pay

## 2017-11-03 ENCOUNTER — Encounter: Payer: Self-pay | Admitting: Emergency Medicine

## 2017-11-03 ENCOUNTER — Emergency Department: Payer: Medicare Other

## 2017-11-03 ENCOUNTER — Emergency Department
Admission: EM | Admit: 2017-11-03 | Discharge: 2017-11-03 | Disposition: A | Discharge status: Home / Self Care | Payer: Medicare Other | Attending: Emergency Medicine | Admitting: Emergency Medicine

## 2017-11-03 DIAGNOSIS — I110 Hypertensive heart disease with heart failure: Secondary | ICD-10-CM | POA: Insufficient documentation

## 2017-11-03 DIAGNOSIS — Z7982 Long term (current) use of aspirin: Secondary | ICD-10-CM | POA: Diagnosis not present

## 2017-11-03 DIAGNOSIS — R109 Unspecified abdominal pain: Secondary | ICD-10-CM | POA: Diagnosis present

## 2017-11-03 DIAGNOSIS — Z79899 Other long term (current) drug therapy: Secondary | ICD-10-CM | POA: Diagnosis not present

## 2017-11-03 DIAGNOSIS — J449 Chronic obstructive pulmonary disease, unspecified: Secondary | ICD-10-CM | POA: Diagnosis not present

## 2017-11-03 DIAGNOSIS — N132 Hydronephrosis with renal and ureteral calculous obstruction: Secondary | ICD-10-CM | POA: Insufficient documentation

## 2017-11-03 DIAGNOSIS — I509 Heart failure, unspecified: Secondary | ICD-10-CM | POA: Diagnosis not present

## 2017-11-03 DIAGNOSIS — Z87442 Personal history of urinary calculi: Secondary | ICD-10-CM | POA: Diagnosis not present

## 2017-11-03 DIAGNOSIS — Z96641 Presence of right artificial hip joint: Secondary | ICD-10-CM | POA: Insufficient documentation

## 2017-11-03 DIAGNOSIS — I2510 Atherosclerotic heart disease of native coronary artery without angina pectoris: Secondary | ICD-10-CM | POA: Insufficient documentation

## 2017-11-03 DIAGNOSIS — I252 Old myocardial infarction: Secondary | ICD-10-CM | POA: Diagnosis not present

## 2017-11-03 DIAGNOSIS — N201 Calculus of ureter: Secondary | ICD-10-CM

## 2017-11-03 LAB — URINALYSIS, COMPLETE (UACMP) WITH MICROSCOPIC
Bilirubin Urine: NEGATIVE
GLUCOSE, UA: NEGATIVE mg/dL
KETONES UR: NEGATIVE mg/dL
LEUKOCYTES UA: NEGATIVE
Nitrite: NEGATIVE
PROTEIN: 30 mg/dL — AB
RBC / HPF: >50 RBC/hpf — ABNORMAL HIGH (ref 0–5)
Specific Gravity, Urine: 1.018 (ref 1.005–1.030)
pH: 5 (ref 5.0–8.0)

## 2017-11-03 LAB — CBC
HCT: 38.5 % — ABNORMAL LOW (ref 40.0–52.0)
HEMOGLOBIN: 12.9 g/dL — AB (ref 13.0–18.0)
MCH: 30.8 pg (ref 26.0–34.0)
MCHC: 33.6 g/dL (ref 32.0–36.0)
MCV: 91.8 fL (ref 80.0–100.0)
Platelets: 207 10*3/uL (ref 150–440)
RBC: 4.2 MIL/uL — ABNORMAL LOW (ref 4.40–5.90)
RDW: 14.1 % (ref 11.5–14.5)
WBC: 8.6 10*3/uL (ref 3.8–10.6)

## 2017-11-03 LAB — COMPREHENSIVE METABOLIC PANEL
ALBUMIN: 4.2 g/dL (ref 3.5–5.0)
ALT: 25 U/L (ref 17–63)
ANION GAP: 8 (ref 5–15)
AST: 38 U/L (ref 15–41)
Alkaline Phosphatase: 85 U/L (ref 38–126)
BUN: 14 mg/dL (ref 6–20)
CHLORIDE: 108 mmol/L (ref 101–111)
CO2: 23 mmol/L (ref 22–32)
Calcium: 8.3 mg/dL — ABNORMAL LOW (ref 8.9–10.3)
Creatinine, Ser: 0.55 mg/dL — ABNORMAL LOW (ref 0.61–1.24)
GFR calc Af Amer: >60 mL/min (ref >60)
GFR calc non Af Amer: >60 mL/min (ref >60)
GLUCOSE: 96 mg/dL (ref 65–99)
Potassium: 3.6 mmol/L (ref 3.5–5.1)
SODIUM: 139 mmol/L (ref 135–145)
Total Bilirubin: 0.8 mg/dL (ref 0.3–1.2)
Total Protein: 6.9 g/dL (ref 6.5–8.1)

## 2017-11-03 MED ORDER — FENTANYL CITRATE (PF) 100 MCG/2ML IJ SOLN
50.0000 ug | INTRAMUSCULAR | Status: DC | PRN
  Administered 2017-11-03: 50 ug via INTRAVENOUS
  Filled 2017-11-03: qty 2

## 2017-11-03 MED ORDER — ONDANSETRON HCL 4 MG/2ML IJ SOLN
4.0000 mg | Freq: Once | INTRAMUSCULAR | Status: AC
  Administered 2017-11-03: 4 mg via INTRAVENOUS
  Filled 2017-11-03: qty 2

## 2017-11-03 MED ORDER — TAMSULOSIN HCL 0.4 MG PO CAPS: 0.4000 mg | ORAL_CAPSULE | Freq: Every day | ORAL | 0 refills | Status: AC

## 2017-11-03 MED ORDER — SODIUM CHLORIDE 0.9 % IV BOLUS
1000.0000 mL | Freq: Once | INTRAVENOUS | Status: AC
  Administered 2017-11-03: 1000 mL via INTRAVENOUS

## 2017-11-03 MED ORDER — OXYCODONE-ACETAMINOPHEN 5-325 MG PO TABS: 1.0000 | ORAL_TABLET | ORAL | 0 refills | Status: DC | PRN

## 2017-11-03 MED ORDER — IBUPROFEN 600 MG PO TABS: 600.0000 mg | ORAL_TABLET | Freq: Four times a day (QID) | ORAL | 0 refills | Status: DC | PRN

## 2017-11-03 MED ORDER — KETOROLAC TROMETHAMINE 30 MG/ML IJ SOLN
30.0000 mg | Freq: Once | INTRAMUSCULAR | Status: AC
  Administered 2017-11-03: 30 mg via INTRAVENOUS
  Filled 2017-11-03: qty 1

## 2017-11-03 NOTE — Discharge Instructions (Signed)
Dr. Erlene Quan to arrange for follow-up within the next week.  Return to the ER for new, worsening, persistent severe pain, fever, vomiting, weakness, or any other new or worsening symptoms that concern you.

## 2017-11-03 NOTE — ED Notes (Signed)
Pt resting comfortably s/p [place]. Approx 366mL remain in NaCl bolus.

## 2017-11-03 NOTE — ED Provider Notes (Signed)
[facility] Emergency Department Provider Note ____________________________________________   First MD Initiated Contact with Patient 11/03/17 1208     (approximate)  I have reviewed the triage vital signs and the nursing notes.   HISTORY  Chief Complaint Flank Pain    HPI William Armstrong is a 67 y.o. male with PMH as noted below including prior history of ureteral stones who presents with left flank pain, acute onset this morning, constant since then, and radiating into his left suprapubic area.  He reports dark urine and some nausea but denies vomiting or fever.  He denies dysuria.  He states the pain is similar to prior ureteral stones.  He states that yesterday he actually passed 2 stones but he knows that he has more.  His last stone was approximately 6 months ago, and he has required lithotripsy in the past.  Past Medical History:  Diagnosis Date  . Anemia   . Arthritis   . BCC (basal cell carcinoma of skin)   . CHF (congestive heart failure) ([employer])   . Complication of anesthesia    neck hurt for 2-3 days after general anesthesia  . COPD (chronic obstructive pulmonary disease) ([place])   . Coronary artery disease   . Depression   . Glaucoma   . Heart attack ([place])    2013  . History of kidney stones   . HTN (hypertension)   . Hyperglycemia    pt denies  . Hypertension   . Hypokalemia   . Obesity    morbid  . Sleep apnea    osa, CPAP has been dc'd after 249 lb weight loss    Patient Active Problem List   Diagnosis Date Noted  . Primary localized osteoarthritis of right hip 01/18/2017  . Hematuria 11/18/2016  . Chronic respiratory failure with hypoxia ([place]) 01/06/2015  . Chronic bronchitis ([place]) 11/30/2014  . COPD, moderate ([place]) 11/30/2014  . H/O: depression 11/30/2014  . Old myocardial infarction 11/30/2014  . HLD (hyperlipidemia) 11/30/2014  . Low back pain 11/30/2014  . Extreme obesity 11/30/2014  . Arthritis of knee, degenerative  11/30/2014  . Back pain, thoracic 11/30/2014  . Dermatophytosis of groin 11/30/2014  . Adiposity 07/08/2014  . Bariatric surgery status 07/08/2014  . Morbid obesity ([place]) 08/21/2013  . Glaucoma 03/14/2012  . Arthritis, degenerative 03/14/2012  . Ventricular fibrillation ([place]) 03/14/2012  . Arteriosclerosis of coronary artery 02/26/2012  . CAFL (chronic airflow limitation) ([place]) 02/26/2012  . BP (high blood pressure) 02/26/2012  . Chronic obstructive pulmonary disease ([place]) 02/26/2012  . Essential (primary) hypertension 07/09/2006  . Open-angle glaucoma 07/09/2006  . Hypoxemia 07/09/2006  . Obstructive apnea 07/09/2006    Past Surgical History:  Procedure Laterality Date  . CORONARY STENT PLACEMENT  02/2012  . CYSTOSCOPY W/ RETROGRADES Left 11/18/2016   Procedure: CYSTOSCOPY WITH RETROGRADE PYELOGRAM;  Surgeon: Irine Seal, MD;  Location: [facility] ORS;  Service: Urology;  Laterality: Left;  . CYSTOSCOPY WITH STENT PLACEMENT Left 11/18/2016   Procedure: CYSTOSCOPY WITH STENT PLACEMENT;  Surgeon: Irine Seal, MD;  Location: [facility] ORS;  Service: Urology;  Laterality: Left;  . CYSTOSCOPY/URETEROSCOPY/HOLMIUM LASER/STENT PLACEMENT Left 11/29/2016   Procedure: CYSTOSCOPY/URETEROSCOPY/HOLMIUM LASER/STENT EXCHANGE;  Surgeon: Hollice Espy, MD;  Location: [facility] ORS;  Service: Urology;  Laterality: Left;  . CYSTOSCOPY/URETEROSCOPY/HOLMIUM LASER/STENT PLACEMENT Right 03/07/2017   Procedure: CYSTOSCOPY/URETEROSCOPY/HOLMIUM LASER/STENT PLACEMENT;  Surgeon: Hollice Espy, MD;  Location: [facility] ORS;  Service: Urology;  Laterality: Right;  . JOINT REPLACEMENT     right hip  .  LAPAROSCOPIC GASTRIC RESTRICTIVE DUODENAL PROCEDURE (DUODENAL SWITCH)    . LAPAROSCOPIC GASTRIC SLEEVE RESECTION  08/21/2013  . skin removal surgery     removal of extra skin approx 20 lbs  . STONE EXTRACTION WITH BASKET Right 03/07/2017   Procedure: STONE EXTRACTION WITH BASKET;  Surgeon: Hollice Espy, MD;  Location: [facility] ORS;   Service: Urology;  Laterality: Right;  . TONSILLECTOMY AND ADENOIDECTOMY  1960  . TOTAL HIP ARTHROPLASTY Right 01/18/2017   Procedure: TOTAL HIP ARTHROPLASTY ANTERIOR APPROACH;  Surgeon: Hessie Knows, MD;  Location: [facility] ORS;  Service: Orthopedics;  Laterality: Right;    Prior to Admission medications   Medication Sig Start Date End Date Taking? Authorizing Provider  aspirin EC 81 MG tablet Take 81 mg by mouth daily.   Yes [provider]  Cholecalciferol (VITAMIN D) 2000 units CAPS Take 2,000 Units by mouth daily.   Yes [provider]  econazole nitrate 1 % cream Apply 1 application topically 2 (two) times daily as needed (for rash).    Yes [provider]  lisinopril (PRINIVIL,ZESTRIL) 40 MG tablet Take 40 mg by mouth daily.  08/19/16  Yes [provider]  Multiple Vitamin (MULTIVITAMIN WITH MINERALS) TABS tablet Take 1 tablet by mouth daily.   Yes [provider]  nystatin cream (MYCOSTATIN) Apply 1 application topically 2 (two) times daily as needed (for rash).  04/06/15  Yes [provider]  rosuvastatin (CRESTOR) 10 MG tablet Take 10 mg by mouth every other day.    Yes [provider]  sertraline (ZOLOFT) 100 MG tablet TAKE 1 TABLET BY MOUTH ONCE DAILY 08/07/17  Yes Chrismon, Vickki Muff, PA  sildenafil (REVATIO) 20 MG tablet Take 100 mg by mouth daily as needed (for erectile dysfunction).   Yes [provider]  ibuprofen (ADVIL,MOTRIN) 600 MG tablet Take 1 tablet (600 mg total) by mouth every 6 (six) hours as needed. 11/03/17   Arta Silence, MD  oxyCODONE-acetaminophen (PERCOCET) 5-325 MG tablet Take 1 tablet by mouth every 4 (four) hours as needed for severe pain. 11/03/17   Arta Silence, MD  tamsulosin (FLOMAX) 0.4 MG CAPS capsule Take 1 capsule (0.4 mg total) by mouth daily after supper for 7 days. 11/03/17 11/10/17  Arta Silence, MD    Allergies Atorvastatin and Pregabalin  Family History  Problem  Relation Age of Onset  . Heart attack Mother   . Brain cancer Father   . Heart attack Sister   . Congenital heart disease Sister   . Leukemia Paternal Grandmother   . COPD Brother   . Heart disease Brother   . Prostate cancer Neg Hx   . Kidney cancer Neg Hx   . Bladder Cancer Neg Hx     Social History Social History   Tobacco Use  . Smoking status: Never Smoker  . Smokeless tobacco: Never Used  Substance Use Topics  . Alcohol use: No    Alcohol/week: 0.0 oz  . Drug use: No    Review of Systems  Constitutional: No fever. Eyes: No redness. ENT: No sore throat. Cardiovascular: Denies chest pain. Respiratory: Denies shortness of breath. Gastrointestinal: Positive for nausea. Genitourinary: Positive for hematuria.  Musculoskeletal: Negative for back pain. Skin: Negative for rash. Neurological: Negative for headache.   ____________________________________________   PHYSICAL EXAM:  VITAL SIGNS: ED Triage Vitals  Enc Vitals Group     BP 11/03/17 0918 (!) 180/65     Pulse Rate 11/03/17 0918 (!) 55     Resp 11/03/17 0918  16     Temp 11/03/17 0918 98.2 F (36.8 C)     Temp Source 11/03/17 0918 Oral     SpO2 11/03/17 0918 97 %     Weight 11/03/17 0920 235 lb (106.6 kg)     Height 11/03/17 0920 5\' 7"  (1.702 m)     Head Circumference --      Peak Flow --      Pain Score 11/03/17 0920 7     Pain Loc --      Pain Edu? --      Excl. in [place]? --     Constitutional: Alert and oriented. Well appearing and in no acute distress. Eyes: Conjunctivae are normal.  Head: Atraumatic. Nose: No congestion/rhinnorhea. Mouth/Throat: Mucous membranes are moist.   Neck: Normal range of motion.  Cardiovascular:Good peripheral circulation. Respiratory: Normal respiratory effort.  Gastrointestinal: Soft and nontender. No distention.  Genitourinary: No CVA tenderness.  Mild left flank tenderness. Musculoskeletal: No lower extremity edema.  Extremities warm and well perfused.    Neurologic:  Normal speech and language. No gross focal neurologic deficits are appreciated.  Skin:  Skin is warm and dry. No rash noted. Psychiatric: Mood and affect are normal. Speech and behavior are normal.  ____________________________________________   LABS (all labs ordered are listed, but only abnormal results are displayed)  Labs Reviewed  COMPREHENSIVE METABOLIC PANEL - Abnormal; Notable for the following components:      Result Value   Creatinine, Ser 0.55 (*)    Calcium 8.3 (*)    All other components within normal limits  CBC - Abnormal; Notable for the following components:   RBC 4.20 (*)    Hemoglobin 12.9 (*)    HCT 38.5 (*)    All other components within normal limits  URINALYSIS, COMPLETE (UACMP) WITH MICROSCOPIC - Abnormal; Notable for the following components:   Color, Urine AMBER (*)    APPearance CLOUDY (*)    Hgb urine dipstick LARGE (*)    Protein, ur 30 (*)    RBC / HPF >50 (*)    Bacteria, UA RARE (*)    All other components within normal limits   ____________________________________________  EKG   ____________________________________________  RADIOLOGY  US renal: Moderate left hydronephrosis  ____________________________________________   PROCEDURES  Procedure(s) performed: No  Procedures  Critical Care performed: No ____________________________________________   INITIAL IMPRESSION / ASSESSMENT AND PLAN / ED COURSE  Pertinent labs & imaging results that were available during my care of the patient were reviewed by me and considered in my medical decision making (see chart for details).  67 year old male with PMH as noted above and prior history of ureteral stones presents with left flank pain acute onset this morning and feeling similar to prior stone pain with no fever or concerning associated symptoms.  On exam, the patient is well-appearing, vital signs are normal except for hypertension, and the remainder of the exam is  unremarkable except for some left flank discomfort.  Overall presentation is most consistent with ureteral stone.  UA shows blood but no other acute findings.  Creatinine is normal.  Although patient has required intervention in the past given that his pain is relatively mild at this time, and since he has had recurrent stones I would like to avoid performing a CT and instead will obtain a renal ultrasound to evaluate for hydronephrosis and confirm the diagnosis.  ----------------------------------------- 2:32 PM on 11/03/2017 -----------------------------------------  Patient is feeling much better after Toradol and fluids.  He would  like to go home.  The ultrasound does confirm hydronephrosis and some intrarenal stones.  Given that his creatinine is normal he is safe for discharge home.  He is tolerating p.o.  He states that he will call his urologist Dr. Erlene Quan on Monday to schedule an appointment.  I gave thorough return precautions, and he expressed understanding.     ____________________________________________   FINAL CLINICAL IMPRESSION(S) / ED DIAGNOSES  Final diagnoses:  Ureteral stone      NEW MEDICATIONS STARTED DURING THIS VISIT:  New Prescriptions   IBUPROFEN (ADVIL,MOTRIN) 600 MG TABLET    Take 1 tablet (600 mg total) by mouth every 6 (six) hours as needed.   OXYCODONE-ACETAMINOPHEN (PERCOCET) 5-325 MG TABLET    Take 1 tablet by mouth every 4 (four) hours as needed for severe pain.   TAMSULOSIN (FLOMAX) 0.4 MG CAPS CAPSULE    Take 1 capsule (0.4 mg total) by mouth daily after supper for 7 days.     Note:  This document was prepared using Dragon voice recognition software and may include unintentional dictation errors.    Arta Silence, MD 11/03/17 1433

## 2017-11-03 NOTE — ED Notes (Signed)
Pt given remote control and pillow, per request.

## 2017-11-03 NOTE — ED Triage Notes (Signed)
Awoke with L flank pain, history of kidney stones.

## 2017-11-03 NOTE — ED Notes (Signed)
ED Provider at bedside. 

## 2017-11-03 NOTE — ED Notes (Addendum)
Pt ambulatory upon discharge; declined wheel chair. Pt and wife verbalized understanding of discharge instructions, follow-up care and prescriptions. VSS. Skin warm and dry. A&O x4.   E-signature pad not working. Pt signed physical copy. Placed in chart.

## 2017-11-08 ENCOUNTER — Ambulatory Visit (INDEPENDENT_AMBULATORY_CARE_PROVIDER_SITE_OTHER): Payer: Medicare Other | Admitting: Urology

## 2017-11-08 ENCOUNTER — Ambulatory Visit
Admission: RE | Admit: 2017-11-08 | Discharge: 2017-11-08 | Disposition: A | Discharge status: Home / Self Care | Payer: Medicare Other | Source: Ambulatory Visit | Attending: Urology | Admitting: Urology

## 2017-11-08 ENCOUNTER — Encounter: Payer: Self-pay | Admitting: Urology

## 2017-11-08 VITALS — BP 171/76 | HR 68 | Ht 67.0 in | Wt 235.0 lb

## 2017-11-08 DIAGNOSIS — N200 Calculus of kidney: Secondary | ICD-10-CM | POA: Diagnosis not present

## 2017-11-08 DIAGNOSIS — R82991 Hypocitraturia: Secondary | ICD-10-CM | POA: Diagnosis not present

## 2017-11-08 DIAGNOSIS — N1330 Unspecified hydronephrosis: Secondary | ICD-10-CM | POA: Diagnosis not present

## 2017-11-08 LAB — MICROSCOPIC EXAMINATION
Bacteria, UA: NONE SEEN
EPITHELIAL CELLS (NON RENAL): NONE SEEN /HPF (ref 0–10)
RBC, UA: NONE SEEN /hpf (ref 0–2)
WBC, UA: NONE SEEN /hpf (ref 0–5)

## 2017-11-08 LAB — URINALYSIS, COMPLETE
BILIRUBIN UA: NEGATIVE
Glucose, UA: NEGATIVE
KETONES UA: NEGATIVE
LEUKOCYTES UA: NEGATIVE
Nitrite, UA: NEGATIVE
PROTEIN UA: NEGATIVE
RBC UA: NEGATIVE
SPEC GRAV UA: 1.025 (ref 1.005–1.030)
Urobilinogen, Ur: 1 mg/dL (ref 0.2–1.0)
pH, UA: 5.5 (ref 5.0–7.5)

## 2017-11-08 NOTE — Progress Notes (Signed)
11/08/2017 6:09 PM   [name] Nov 06, 1950 [id number]  Referring provider: [name], [address] [place], [place] [address]  Chief Complaint  Patient presents with  . Nephrolithiasis    follow up    HPI: 67 year old male with a personal history of recurrent nephrolithiasis who presents today with several days of left lower quadrant pain.  He notes that the pain initially started in the flank and migrated to his left lower quadrant.  He is concerned that he has a stone.  He was seen and evaluated in the emergency room on 11/03/2016 for the same pain.  No cross-sectional imaging was obtained.  He did have a renal ultrasound that showed mild to moderate left-sided hydronephrosis, presumably secondary to an obstructing stone.  No fevers or chills.  No associated nausea or vomiting.  No dysuria or gross hematuria.  Most recent imaging prior to the above renal ultrasound on 10/02/2017 shows 2 stones, 7 mm and 2 mm left-sided stone.     See previous notes for details.  In addition today, he did have an interval 99-BZJI urine metabolic analysis which was reviewed today in detail.  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  See scanned media for details.  PMH: Past Medical History:  Diagnosis Date  . Anemia   . Arthritis   . BCC (basal cell carcinoma of skin)   . CHF (congestive heart failure) ([place])   . Complication of anesthesia    neck hurt for 2-3 days after general anesthesia  . COPD (chronic obstructive pulmonary disease) ([place])   . Coronary artery disease   . Depression   . Glaucoma   . Heart attack ([place])    2013  . History of kidney stones   . HTN (hypertension)   . Hyperglycemia    pt denies  . Hypertension   . Hypokalemia   . Obesity    morbid  . Sleep apnea    osa, CPAP has been dc'd after 249 lb weight loss    Surgical History: Past Surgical History:  Procedure Laterality Date  . CORONARY STENT PLACEMENT  02/2012  . CYSTOSCOPY W/ RETROGRADES  Left 11/18/2016   Procedure: CYSTOSCOPY WITH RETROGRADE PYELOGRAM;  Surgeon: Irine Seal, MD;  Location: [facility];  Service: Urology;  Laterality: Left;  . CYSTOSCOPY WITH STENT PLACEMENT Left 11/18/2016   Procedure: CYSTOSCOPY WITH STENT PLACEMENT;  Surgeon: Irine Seal, MD;  Location: [facility];  Service: Urology;  Laterality: Left;  . CYSTOSCOPY/URETEROSCOPY/HOLMIUM LASER/STENT PLACEMENT Left 11/29/2016   Procedure: CYSTOSCOPY/URETEROSCOPY/HOLMIUM LASER/STENT EXCHANGE;  Surgeon: Hollice Espy, MD;  Location: [facility];  Service: Urology;  Laterality: Left;  . CYSTOSCOPY/URETEROSCOPY/HOLMIUM LASER/STENT PLACEMENT Right 03/07/2017   Procedure: CYSTOSCOPY/URETEROSCOPY/HOLMIUM LASER/STENT PLACEMENT;  Surgeon: Hollice Espy, MD;  Location: [facility];  Service: Urology;  Laterality: Right;  . JOINT REPLACEMENT     right hip  . LAPAROSCOPIC GASTRIC RESTRICTIVE DUODENAL PROCEDURE (DUODENAL SWITCH)    . LAPAROSCOPIC GASTRIC SLEEVE RESECTION  08/21/2013  . skin removal surgery     removal of extra skin approx 20 lbs  . STONE EXTRACTION WITH BASKET Right 03/07/2017   Procedure: STONE EXTRACTION WITH BASKET;  Surgeon: Hollice Espy, MD;  Location: [facility];  Service: Urology;  Laterality: Right;  . TONSILLECTOMY AND ADENOIDECTOMY  1960  . TOTAL HIP ARTHROPLASTY Right 01/18/2017   Procedure: TOTAL HIP ARTHROPLASTY ANTERIOR APPROACH;  Surgeon: Hessie Knows, MD;  Location: [facility];  Service: Orthopedics;  Laterality: Right;    Home Medications:  Allergies as of  11/08/2017      Reactions   Atorvastatin Other (See Comments)   Muscle aches.   Pregabalin Other (See Comments)   Muscle soreness      Medication List       Accurate as of November 08, 2017 11:59 PM. Always use your most recent med list.        aspirin EC 81 MG tablet Take 81 mg by mouth daily.   CRESTOR 10 MG tablet Generic drug:  rosuvastatin Take 10 mg by mouth every other day.   econazole nitrate 1 % cream Apply 1 application  topically 2 (two) times daily as needed (for rash).   ibuprofen 600 MG tablet Commonly known as:  ADVIL,MOTRIN Take 1 tablet (600 mg total) by mouth every 6 (six) hours as needed.   lisinopril 40 MG tablet Commonly known as:  PRINIVIL,ZESTRIL Take 40 mg by mouth daily.   multivitamin with minerals Tabs tablet Take 1 tablet by mouth daily.   nystatin cream Commonly known as:  MYCOSTATIN Apply 1 application topically 2 (two) times daily as needed (for rash).   oxyCODONE-acetaminophen 5-325 MG tablet Commonly known as:  PERCOCET Take 1 tablet by mouth every 4 (four) hours as needed for severe pain.   sertraline 100 MG tablet Commonly known as:  ZOLOFT TAKE 1 TABLET BY MOUTH ONCE DAILY   sildenafil 20 MG tablet Commonly known as:  REVATIO Take 100 mg by mouth daily as needed (for erectile dysfunction).   tamsulosin 0.4 MG Caps capsule Commonly known as:  FLOMAX Take 1 capsule (0.4 mg total) by mouth daily after supper for 7 days.   Vitamin D 50 MCG (2000 UT) Caps Take 2,000 Units by mouth daily.       Allergies:  Allergies  Allergen Reactions  . Atorvastatin Other (See Comments)    Muscle aches.  . Pregabalin Other (See Comments)    Muscle soreness    Family History: Family History  Problem Relation Age of Onset  . Heart attack Mother   . Brain cancer Father   . Heart attack Sister   . Congenital heart disease Sister   . Leukemia Paternal Grandmother   . COPD Brother   . Heart disease Brother   . Prostate cancer Neg Hx   . Kidney cancer Neg Hx   . Bladder Cancer Neg Hx     Social History:  reports that he has never smoked. He has never used smokeless tobacco. He reports that he does not drink alcohol or use drugs.  ROS: UROLOGY Frequent Urination?: No Hard to postpone urination?: No Burning/pain with urination?: No Get up at night to urinate?: No Leakage of urine?: No Urine stream starts and stops?: No Trouble starting stream?: No Do you have to  strain to urinate?: No Blood in urine?: No Urinary tract infection?: No Sexually transmitted disease?: No Injury to kidneys or bladder?: No Painful intercourse?: No Weak stream?: No Erection problems?: No Penile pain?: No  Gastrointestinal Nausea?: No Vomiting?: No Indigestion/heartburn?: No Diarrhea?: No Constipation?: No  Constitutional Fever: No Night sweats?: No Weight loss?: No Fatigue?: No  Skin Skin rash/lesions?: No Itching?: No  Eyes Blurred vision?: No Double vision?: No  Ears/Nose/Throat Sore throat?: No Sinus problems?: No  Hematologic/Lymphatic Swollen glands?: No Easy bruising?: No  Cardiovascular Leg swelling?: No Chest pain?: No  Respiratory Cough?: No Shortness of breath?: No  Endocrine Excessive thirst?: No  Musculoskeletal Back pain?: No Joint pain?: No  Neurological Headaches?: No Dizziness?: No  Psychologic Depression?:  Yes Anxiety?: No  Physical Exam: BP (!) 171/76   Pulse 68   Ht 5\' 7"  (1.702 m)   Wt 235 lb (106.6 kg)   BMI 36.81 kg/m   Constitutional:  Alert and oriented, No acute distress. HEENT: [place] AT, moist mucus membranes.  Trachea midline, no masses. Cardiovascular: No clubbing, cyanosis, or edema. Respiratory: Normal respiratory effort, no increased work of breathing. GI: Abdomen is soft, nontender, nondistended. Skin: No rashes, bruises or suspicious lesions. Neurologic: Grossly intact, no focal deficits, moving all 4 extremities. Psychiatric: Normal mood and affect.  Laboratory Data: Lab Results  Component Value Date   WBC 8.6 11/03/2017   HGB 12.9 (L) 11/03/2017   HCT 38.5 (L) 11/03/2017   MCV 91.8 11/03/2017   PLT 207 11/03/2017    Lab Results  Component Value Date   CREATININE 0.46 (L) 11/22/2017    Lab Results  Component Value Date   HGBA1C 5.7 01/21/2013    Urinalysis   Pertinent Imaging: KUB ordered today, pending   Assessment & Plan:    1. Kidney stone on left side Below -  Urinalysis, Complete - CULTURE, URINE COMPREHENSIVE  2. 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  Hollice Espy, MD  [facility] [address] [place], [place] [address] [phone]

## 2017-11-10 LAB — CULTURE, URINE COMPREHENSIVE

## 2017-11-13 ENCOUNTER — Other Ambulatory Visit: Payer: Self-pay | Admitting: Urology

## 2017-11-22 ENCOUNTER — Other Ambulatory Visit: Payer: Medicare Other

## 2017-11-22 ENCOUNTER — Ambulatory Visit
Admission: RE | Admit: 2017-11-22 | Discharge: 2017-11-22 | Disposition: A | Discharge status: Home / Self Care | Payer: Medicare Other | Source: Ambulatory Visit | Attending: Urology | Admitting: Urology

## 2017-11-22 DIAGNOSIS — N1330 Unspecified hydronephrosis: Secondary | ICD-10-CM | POA: Diagnosis present

## 2017-11-22 DIAGNOSIS — N200 Calculus of kidney: Secondary | ICD-10-CM | POA: Diagnosis not present

## 2017-11-23 LAB — BASIC METABOLIC PANEL
BUN/Creatinine Ratio: 28 — ABNORMAL HIGH (ref 10–24)
BUN: 13 mg/dL (ref 8–27)
CO2: 24 mmol/L (ref 20–29)
Calcium: 8.6 mg/dL (ref 8.6–10.2)
Chloride: 103 mmol/L (ref 96–106)
Creatinine, Ser: 0.46 mg/dL — ABNORMAL LOW (ref 0.76–1.27)
GFR calc Af Amer: 132 mL/min/{1.73_m2} (ref >59)
GFR calc non Af Amer: 114 mL/min/{1.73_m2} (ref >59)
GLUCOSE: 87 mg/dL (ref 65–99)
POTASSIUM: 4.1 mmol/L (ref 3.5–5.2)
SODIUM: 138 mmol/L (ref 134–144)

## 2017-11-28 ENCOUNTER — Telehealth: Payer: Self-pay | Admitting: Urology

## 2017-11-28 MED ORDER — POTASSIUM CITRATE ER 15 MEQ (1620 MG) PO TBCR
1.0000 | EXTENDED_RELEASE_TABLET | Freq: Two times a day (BID) | ORAL | 3 refills | Status: DC
Start: 2017-11-28 — End: 2018-04-26

## 2017-11-28 NOTE — Telephone Encounter (Signed)
Called patient to discuss renal ultrasound results showing 1 cm left UVJ stone.  He reports that he passed a large stone 2 days after the renal ultrasound was complete.  He has not will bring it to his next appointment.  He is otherwise completely asymptomatic at this time.  He is also been taking potassium citrate without difficulty.  He did have his potassium levels checked, potassium 4.1.  He will continue this medication.  Prescription sent to pharmacy.  Hollice Espy, MD

## 2018-01-08 ENCOUNTER — Ambulatory Visit
Admission: RE | Admit: 2018-01-08 | Discharge: 2018-01-08 | Disposition: A | Discharge status: Home / Self Care | Payer: Medicare Other | Source: Ambulatory Visit | Attending: Urology | Admitting: Urology

## 2018-01-08 ENCOUNTER — Other Ambulatory Visit: Payer: Self-pay

## 2018-01-08 DIAGNOSIS — N200 Calculus of kidney: Secondary | ICD-10-CM

## 2018-01-09 ENCOUNTER — Encounter: Payer: Self-pay | Admitting: Urology

## 2018-01-09 ENCOUNTER — Ambulatory Visit (INDEPENDENT_AMBULATORY_CARE_PROVIDER_SITE_OTHER): Payer: Medicare Other | Admitting: Urology

## 2018-01-09 VITALS — BP 168/69 | HR 51 | Ht 67.0 in | Wt 235.0 lb

## 2018-01-09 DIAGNOSIS — N200 Calculus of kidney: Secondary | ICD-10-CM

## 2018-01-09 NOTE — Progress Notes (Signed)
01/09/2018 5:50 PM   William Armstrong 1950/09/12 [id number]  Referring provider: [name], [address], [place] [address]  Chief Complaint  Patient presents with  . Nephrolithiasis    78month    HPI: 67 year old male with recurrent nephrolithiasis who returns today for stone surveillance.  Since his last visit, he passed several stones measuring up to 7 mm a few days after his renal ultrasound showed left-sided hydronephrosis.  Since then, he had no further flank pain, hematuria, or any other significant urinary symptoms.  He brings the stones with him today to the office.  After last visit, he was started on potassium citrate 15 mEq twice daily.  His potassium levels were normal on 11/22/2017 weeks of the medication.  He is been tolerating medication without difficulty.  KUB today shows stable left-sided without ureteral stone.   Stone analysis shows 90% calcium oxalate monohydrate, 5% calcium oxalate dihydrate, 5% calcium phosphate  He did recently undergo 24 urine, see previous note for details.  Risk factors for stone including a personal history of gastric sleeve.  Currently on calcium and vitamin D supplementation.  He reports today that he continues to drink lots of water.  He is been following a stone diet.   PMH: Past Medical History:  Diagnosis Date  . Anemia   . Arthritis   . BCC (basal cell carcinoma of skin)   . CHF (congestive heart failure) ([place])   . Complication of anesthesia    neck hurt for 2-3 days after general anesthesia  . COPD (chronic obstructive pulmonary disease) ([place])   . Coronary artery disease   . Depression   . Glaucoma   . Heart attack ([place])    2013  . History of kidney stones   . HTN (hypertension)   . Hyperglycemia    pt denies  . Hypertension   . Hypokalemia   . Obesity    morbid  . Sleep apnea    osa, CPAP has been dc'd after 249 lb weight loss    Surgical History: Past Surgical History:  Procedure  Laterality Date  . CORONARY STENT PLACEMENT  02/2012  . CYSTOSCOPY W/ RETROGRADES Left 11/18/2016   Procedure: CYSTOSCOPY WITH RETROGRADE PYELOGRAM;  Surgeon: Irine Seal, MD;  Location: [facility];  Service: Urology;  Laterality: Left;  . CYSTOSCOPY WITH STENT PLACEMENT Left 11/18/2016   Procedure: CYSTOSCOPY WITH STENT PLACEMENT;  Surgeon: Irine Seal, MD;  Location: [facility];  Service: Urology;  Laterality: Left;  . CYSTOSCOPY/URETEROSCOPY/HOLMIUM LASER/STENT PLACEMENT Left 11/29/2016   Procedure: CYSTOSCOPY/URETEROSCOPY/HOLMIUM LASER/STENT EXCHANGE;  Surgeon: Hollice Espy, MD;  Location: [facility];  Service: Urology;  Laterality: Left;  . CYSTOSCOPY/URETEROSCOPY/HOLMIUM LASER/STENT PLACEMENT Right 03/07/2017   Procedure: CYSTOSCOPY/URETEROSCOPY/HOLMIUM LASER/STENT PLACEMENT;  Surgeon: Hollice Espy, MD;  Location: [facility];  Service: Urology;  Laterality: Right;  . JOINT REPLACEMENT     right hip  . LAPAROSCOPIC GASTRIC RESTRICTIVE DUODENAL PROCEDURE (DUODENAL SWITCH)    . LAPAROSCOPIC GASTRIC SLEEVE RESECTION  08/21/2013  . skin removal surgery     removal of extra skin approx 20 lbs  . STONE EXTRACTION WITH BASKET Right 03/07/2017   Procedure: STONE EXTRACTION WITH BASKET;  Surgeon: Hollice Espy, MD;  Location: [facility];  Service: Urology;  Laterality: Right;  . TONSILLECTOMY AND ADENOIDECTOMY  1960  . TOTAL HIP ARTHROPLASTY Right 01/18/2017   Procedure: TOTAL HIP ARTHROPLASTY ANTERIOR APPROACH;  Surgeon: Hessie Knows, MD;  Location: [facility];  Service: Orthopedics;  Laterality: Right;    Home  Medications:  Allergies as of 01/09/2018      Reactions   Atorvastatin Other (See Comments)   Muscle aches.   Pregabalin Other (See Comments)   Muscle soreness      Medication List        Accurate as of 01/09/18  5:50 PM. Always use your most recent med list.          aspirin EC 81 MG tablet Take 81 mg by mouth daily.   CRESTOR 10 MG tablet Generic drug:  rosuvastatin Take 10 mg by  mouth every other day.   econazole nitrate 1 % cream Apply 1 application topically 2 (two) times daily as needed (for rash).   ibuprofen 600 MG tablet Commonly known as:  ADVIL,MOTRIN Take 1 tablet (600 mg total) by mouth every 6 (six) hours as needed.   lisinopril 40 MG tablet Commonly known as:  PRINIVIL,ZESTRIL Take 40 mg by mouth daily.   multivitamin with minerals Tabs tablet Take 1 tablet by mouth daily.   nystatin cream Commonly known as:  MYCOSTATIN Apply 1 application topically 2 (two) times daily as needed (for rash).   oxybutynin 5 MG tablet Commonly known as:  DITROPAN TAKE 1 TABLET BY MOUTH EVERY 8 HOURS AS NEEDED FOR BLADDER SPASMS   Potassium Citrate 15 MEQ (1620 MG) Tbcr Commonly known as:  UROCIT-K 15 Take 1 tablet by mouth 2 (two) times daily.   sertraline 100 MG tablet Commonly known as:  ZOLOFT TAKE 1 TABLET BY MOUTH ONCE DAILY   sildenafil 20 MG tablet Commonly known as:  REVATIO Take 100 mg by mouth daily as needed (for erectile dysfunction).   Vitamin D 2000 units Caps Take 2,000 Units by mouth daily.       Allergies:  Allergies  Allergen Reactions  . Atorvastatin Other (See Comments)    Muscle aches.  . Pregabalin Other (See Comments)    Muscle soreness    Family History: Family History  Problem Relation Age of Onset  . Heart attack Mother   . Brain cancer Father   . Heart attack Sister   . Congenital heart disease Sister   . Leukemia Paternal Grandmother   . COPD Brother   . Heart disease Brother   . Prostate cancer Neg Hx   . Kidney cancer Neg Hx   . Bladder Cancer Neg Hx     Social History:  reports that he has never smoked. He has never used smokeless tobacco. He reports that he does not drink alcohol or use drugs.  ROS: UROLOGY Frequent Urination?: No Hard to postpone urination?: No Burning/pain with urination?: No Get up at night to urinate?: No Leakage of urine?: No Urine stream starts and stops?: No Trouble  starting stream?: No Do you have to strain to urinate?: No Blood in urine?: No Urinary tract infection?: No Sexually transmitted disease?: No Injury to kidneys or bladder?: No Painful intercourse?: No Weak stream?: No Erection problems?: No Penile pain?: No  Gastrointestinal Nausea?: No Vomiting?: No Indigestion/heartburn?: No Diarrhea?: No Constipation?: No  Constitutional Fever: No Night sweats?: No Weight loss?: No Fatigue?: No  Skin Skin rash/lesions?: No Itching?: No  Eyes Blurred vision?: No Double vision?: No  Ears/Nose/Throat Sore throat?: No Sinus problems?: No  Hematologic/Lymphatic Swollen glands?: No Easy bruising?: Yes  Cardiovascular Leg swelling?: No Chest pain?: No  Respiratory Cough?: No Shortness of breath?: No  Endocrine Excessive thirst?: No  Musculoskeletal Back pain?: No Joint pain?: No  Neurological Headaches?: No Dizziness?: No  Psychologic Depression?: Yes Anxiety?: No  Physical Exam: BP (!) 168/69   Pulse (!) 51   Ht 5\' 7"  (1.702 m)   Wt 235 lb (106.6 kg)   BMI 36.81 kg/m   Constitutional:  Alert and oriented, No acute distress.  Accompanied by wife today. HEENT:  AT, moist mucus membranes.  Trachea midline, no masses. Cardiovascular: No clubbing, cyanosis, or edema. Respiratory: Normal respiratory effort, no increased work of breathing. Skin: No rashes, bruises or suspicious lesions.  Excess loose skin appreciated. Neurologic: Grossly intact, no focal deficits, moving all 4 extremities. Psychiatric: Normal mood and affect.  Laboratory Data: Lab Results  Component Value Date   WBC 8.6 11/03/2017   HGB 12.9 (L) 11/03/2017   HCT 38.5 (L) 11/03/2017   MCV 91.8 11/03/2017   PLT 207 11/03/2017    Lab Results  Component Value Date   CREATININE 0.46 (L) 11/22/2017     Lab Results  Component Value Date   HGBA1C 5.7 01/21/2013    Urinalysis N/a  Pertinent Imaging: Results for orders placed during  the hospital encounter of 01/08/18  Abdomen 1 view (KUB)   Narrative CLINICAL DATA:  Nephrolithiasis.  EXAM: ABDOMEN - 1 VIEW  COMPARISON:  Radiographs of November 08, 2017.  FINDINGS: The bowel gas pattern is normal. Phleboliths are noted in the pelvis. Stable left nephrolithiasis.  IMPRESSION: Stable left nephrolithiasis. No evidence of bowel obstruction or ileus.   Electronically Signed   By: Marijo Conception, M.D.   On: 01/08/2018 14:00     Results for orders placed during the hospital encounter of 11/22/17  US RENAL   Narrative CLINICAL DATA:  History of kidney stones  EXAM: RENAL / URINARY TRACT ULTRASOUND COMPLETE  COMPARISON:  KUB of 11/08/2017  FINDINGS: Right Kidney:  Length: 14.3 cm. No hydronephrosis is seen. Small left renal cysts are present. The larger is in the upper pole 2.6 cm with a smaller in the lower pole of 2.2 cm. There is a calculus present within the mid upper right kidney of 5 mm in diameter.  Left Kidney:  Length: 13.4 cm. There is mild fullness of the left pelvocaliceal system indicating mild hydronephrosis. There may be small upper pole calculi present which are not easily measured.  Bladder:  The urinary bladder is unremarkable. A small calculus of 5 mm in diameter is noted within the pole. There does appear to be an echogenic focus in the region of the left UV junction posteriorly in the left urinary bladder probably representing either a calculus of 1.0 cm within the junction or within the distal left ureter. The prevoid urine volume is 288 cubic cm with postvoid volume of 39 cubic cm.  IMPRESSION: 1. 1.0 cm calculus appears to be at the left UV junction or within the distal left ureter. Mild hydronephrosis is present on the left. 2. Small renal calculi bilaterally.   Electronically Signed   By: Ivar Drape M.D.   On: 11/22/2017 15:59    No results found for this or any previous visit. No results found for this or any  previous visit. Results for orders placed during the hospital encounter of 11/18/16  CT Renal Stone Study   Narrative CLINICAL DATA:  Left flank pain.  Hematuria.  EXAM: CT ABDOMEN AND PELVIS WITHOUT CONTRAST  TECHNIQUE: Multidetector CT imaging of the abdomen and pelvis was performed following the standard protocol without IV contrast.  COMPARISON:  Ultrasound 10/16/2016  FINDINGS: Lower chest: Advanced calcific atherosclerotic disease of the coronary  arteries. Mildly enlarged heart.  Hepatobiliary: 2.2 cm left hepatic cyst. A second 1 cm hypoattenuated lesion in the dome of the liver, too small to be actually characterize by CT.  Pancreas: Somewhat atrophic.  Spleen: Normal in size without focal abnormality.  Adrenals/Urinary Tract: Normal adrenal glands. Bilateral nephrolithiasis, with renal stones in the lower pole of the right kidney measuring 9.6 mm and lower pole of the left kidney measuring 9 mm. No evidence of right hydronephrosis. Moderate left hydronephrosis and perinephric fat stranding. Moderate left hydroureter. Fat stranding along the dilated ureter noted as well. Obstructive 13 mm stone in the distal left ureter. Bulbous dilation of the left ureter just upstream to the level of obstruction with small amount of periureteral free fluid.  Stomach/Bowel: No evidence of bowel wall thickening, distention, or inflammatory changes. 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  Vascular/Lymphatic: Aortic atherosclerosis. No enlarged abdominal or pelvic lymph nodes.  Reproductive: Prostate is unremarkable.  Other: Longitudinal fluid collection along the superficial fascia of the right rectus abdominus measures approximately 14 cm in craniocaudal dimension. Fat stranding in the right flank and right anterior abdominal wall may be postsurgical.  Musculoskeletal: Multilevel osteoarthritic changes and posterior facet arthropathy of the lumbosacral  spine.  IMPRESSION: Left obstructive uropathy caused by 13 mm distal left ureteral stone. Bulbous dilation of the left ureter just upstream to the level of the obstruction with a small amount of periureteral free fluid. These may represent postobstructive inflammatory changes versus micro rupture.  Bilateral nephrolithiasis.  Longitudinal fluid collection within the right paramedian anterior abdominal wall, measuring 14 cm in craniocaudal dimension. Please correlate to clinical exam findings.  1 cm hypoattenuated lesion within the dome of the liver, incompletely characterized.  Calcific atherosclerotic disease of the aorta.   Electronically Signed   By: Fidela Salisbury M.D.   On: 11/18/2016 16:17    KUB was reviewed today, stones appear to be stable.  In retrospect, KUB from 11/08/2017 does show some calcifications in the left pelvis which are not present today and likely represented the past ureteral stones.  Assessment & Plan:    1. Recurrent nephrolithiasis Interval passage of obstructing left ureteral calculi which he brings with him today-these do correlate with a calcifications in the left distal ureter on previous KUB which is reassuring that all stones been passed He is currently asymptomatic Stable upper tract stones on most recent KUB We reviewed stone diet recommendations again today Continue potassium citrate 15 mEq twice daily, we will recheck labs next visit if they are not done in the interim by his primary care physician KUB in 6 months If he continues to make frequent stones on potassium citrate, will likely repeat 24-hour urine on his medication to see if we need to adjust the dose - DG Abd 1 View; Future   Return in about 6 months (around 07/12/2018) for KUB, UA, BMP.  Or sooner as needed  Hollice Espy, MD  [place] [address] [place], [place] [address] [phone]

## 2018-04-26 ENCOUNTER — Other Ambulatory Visit: Payer: Self-pay | Admitting: Urology

## 2018-06-03 IMAGING — CR DG C-ARM 61-120 MIN
3 series · 3 of 3 positions shown · non-contrast
Comparison: CT of the abdomen pelvis 11/18/2016

CLINICAL DATA: Left ureteral stent placement.

EXAM:
DG C-ARM 61-120 MIN; ABDOMEN - 1 VIEW

[cont. (1 of 3)]
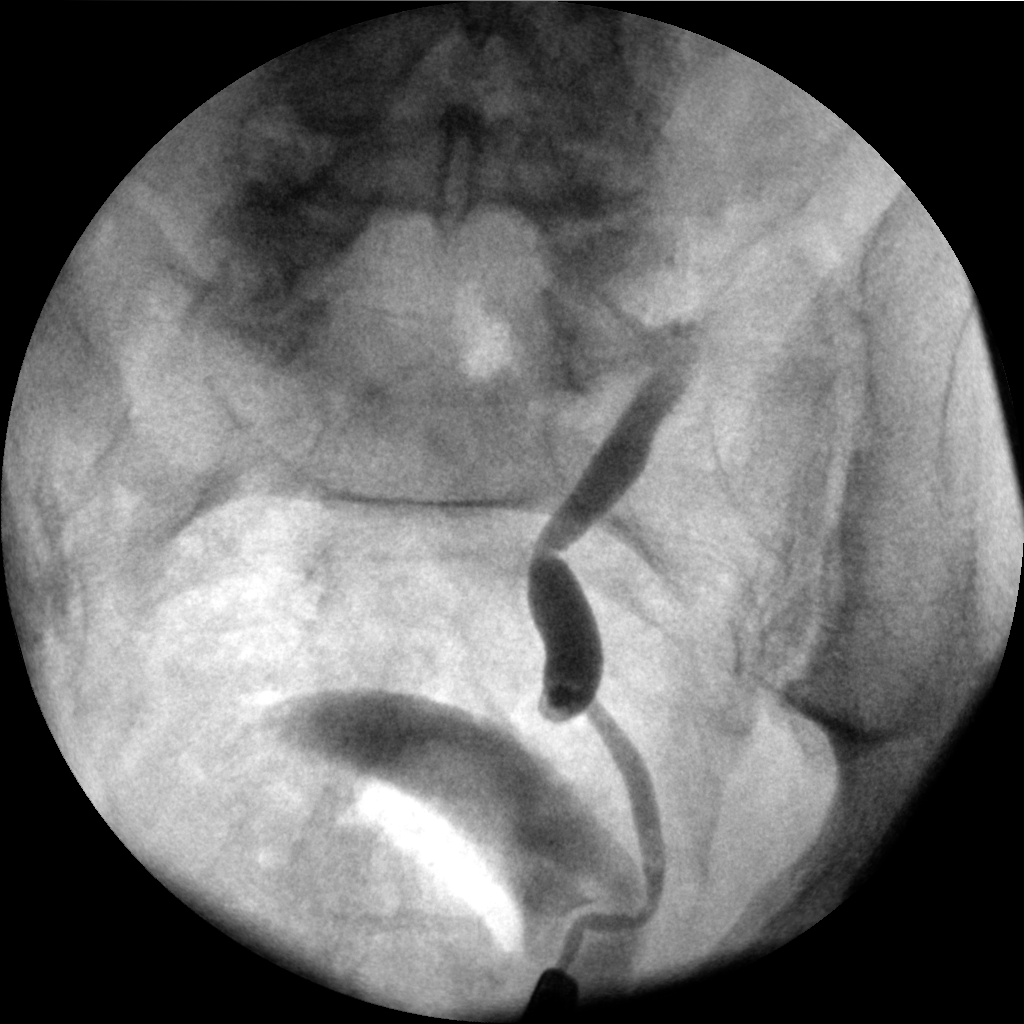

[cont. (2 of 3)]
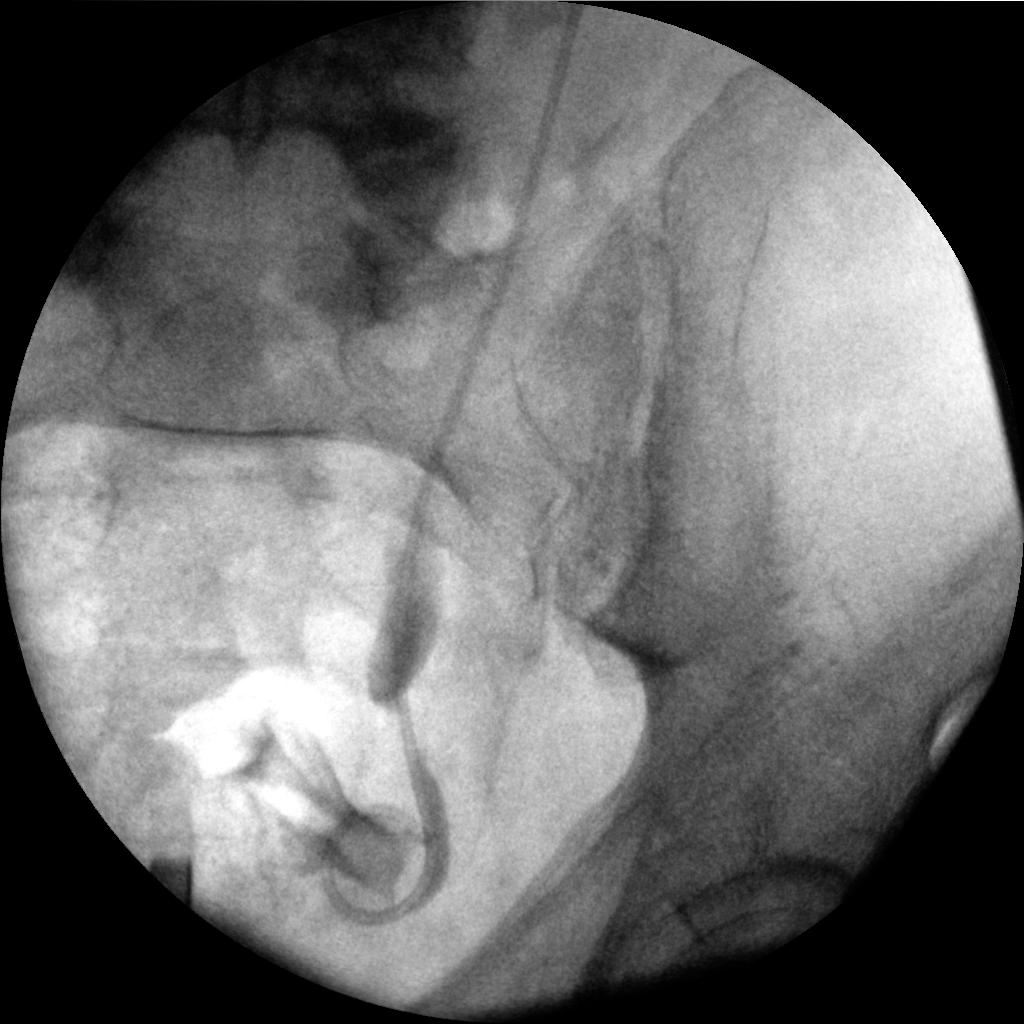

[cont. (3 of 3)]
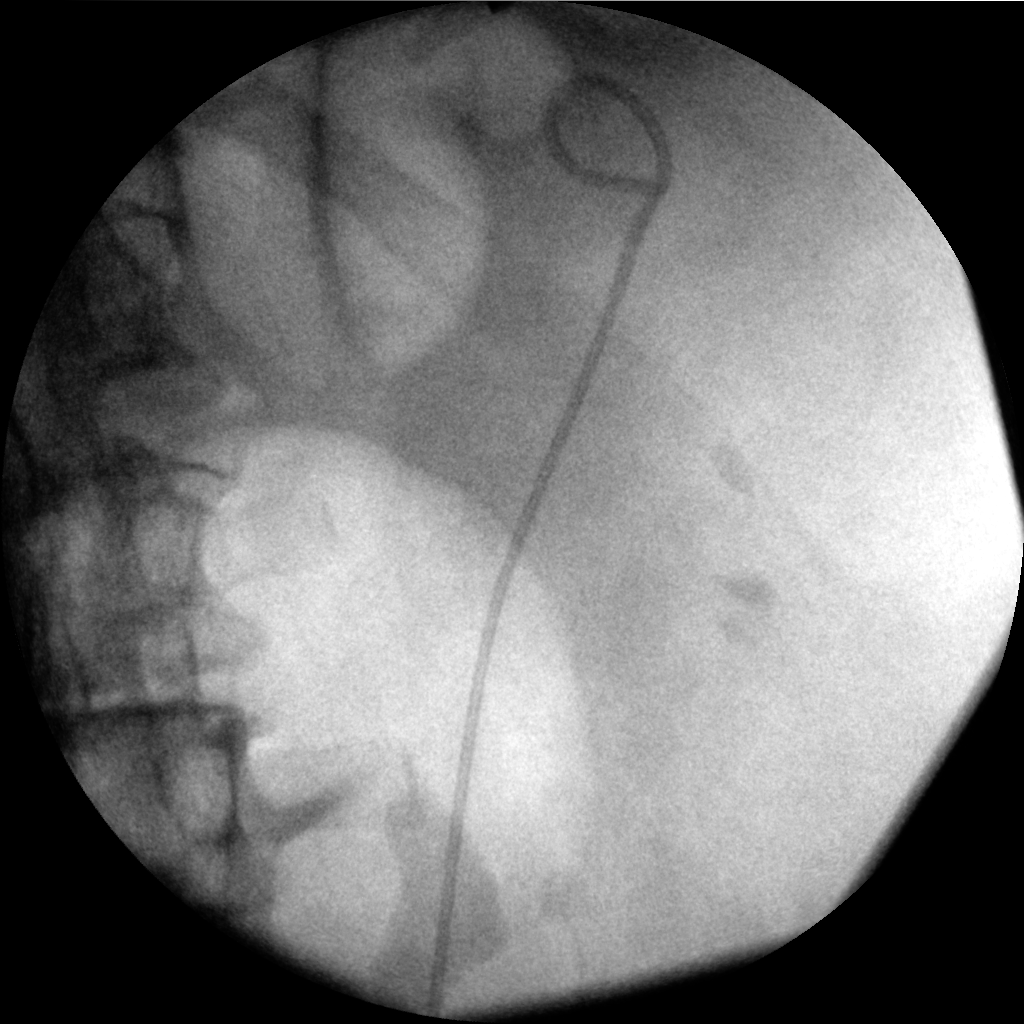

[3 of 3 positions shown; findings below may reference images not displayed]

FINDINGS: Fluoroscopic images from intra procedural left ureteral stent
placement demonstrate double-J catheter along the expected course of
the left ureter. No contrast enhanced fluoroscopic images of the
left kidney are provided to evaluate the proximal position of the
stent.
IMPRESSION: Fluoroscopic images from left ureteral stent placement.

## 2018-06-17 ENCOUNTER — Encounter: Payer: Self-pay | Admitting: Urology

## 2018-06-27 ENCOUNTER — Other Ambulatory Visit: Payer: Self-pay | Admitting: Family Medicine

## 2018-06-28 ENCOUNTER — Other Ambulatory Visit: Payer: Self-pay | Admitting: Family Medicine

## 2018-07-03 ENCOUNTER — Other Ambulatory Visit: Payer: Self-pay | Admitting: Family Medicine

## 2018-07-03 NOTE — Telephone Encounter (Signed)
Pt needing a refill on:  sertraline (ZOLOFT) 100 MG tablet  Please fill at:  [facility], [place] - [place]. [phone] (Phone) [phone] (Fax)   Thanks, [employer]

## 2018-07-04 MED ORDER — SERTRALINE HCL 100 MG PO TABS: 100.0000 mg | ORAL_TABLET | Freq: Every day | ORAL | 0 refills | Status: DC

## 2018-07-04 NOTE — Telephone Encounter (Signed)
Please advise 

## 2018-07-13 NOTE — Progress Notes (Signed)
07/16/2018  9:16 AM   William Armstrong 1950/08/17 [id number]  Referring provider: [name], [place], [place] [address]  Chief Complaint  Patient presents with  . Nephrolithiasis    HPI: William Armstrong is a 68 y.o. male with recurrent nephrolithiasis who returns today for a six month stone surveillance.  UA today shows trace blood, but negative RBCs.  Patient reports that he has passed 6-7 stones since his last visit, including two stones this morning without any pain, 3-4 mm and a 2 mm.  Patient reports some back soreness and a split stream before passing a stone.  KUB on 07/15/2018 showed suspected bilateral nephrolithiasis, with the largest stone a 7 mm one located in the lower pole of the left kidney.  Stone analysis showed 90% calcium oxalate monohydreate, 5% calcium oxalate dihydrate, 5% calcium phosphate.  He has undergone a 24 urine, see 11/08/2017 note for details.  Patient has uretroscopy x 2 in 2018.  Risk factors for stone including a personal history of a gastric sleeve.  Patient has continued to take potassium citrate  15 mEq twice daily, and continues to drink lots of water, about a gallon a day.  Patient says he has been drinking Crystal Light.  Patient tries to avoid eating anything with a lot of acid in it, but does not remember much about a stone diet.  For the most part, he has been making an effort to follow a stone diet.  ED Patient has previously attempted to manage symptoms with sildenafil but is not interested in continuing at this time.   PMH: Past Medical History:  Diagnosis Date  . Anemia   . Arthritis   . BCC (basal cell carcinoma of skin)   . CHF (congestive heart failure) ([place])   . Complication of anesthesia    neck hurt for 2-3 days after general anesthesia  . COPD (chronic obstructive pulmonary disease) ([place])   . Coronary artery disease   . Depression   . Glaucoma   . Heart attack ([place])    2013  . History of  kidney stones   . HTN (hypertension)   . Hyperglycemia    pt denies  . Hypertension   . Hypokalemia   . Obesity    morbid  . Sleep apnea    osa, CPAP has been dc'd after 249 lb weight loss      Surgical History: Past Surgical History:  Procedure Laterality Date  . CORONARY STENT PLACEMENT  02/2012  . CYSTOSCOPY W/ RETROGRADES Left 11/18/2016   Procedure: CYSTOSCOPY WITH RETROGRADE PYELOGRAM;  Surgeon: Irine Seal, MD;  Location: [facility];  Service: Urology;  Laterality: Left;  . CYSTOSCOPY WITH STENT PLACEMENT Left 11/18/2016   Procedure: CYSTOSCOPY WITH STENT PLACEMENT;  Surgeon: Irine Seal, MD;  Location: [facility];  Service: Urology;  Laterality: Left;  . CYSTOSCOPY/URETEROSCOPY/HOLMIUM LASER/STENT PLACEMENT Left 11/29/2016   Procedure: CYSTOSCOPY/URETEROSCOPY/HOLMIUM LASER/STENT EXCHANGE;  Surgeon: Hollice Espy, MD;  Location: [facility];  Service: Urology;  Laterality: Left;  . CYSTOSCOPY/URETEROSCOPY/HOLMIUM LASER/STENT PLACEMENT Right 03/07/2017   Procedure: CYSTOSCOPY/URETEROSCOPY/HOLMIUM LASER/STENT PLACEMENT;  Surgeon: Hollice Espy, MD;  Location: [facility];  Service: Urology;  Laterality: Right;  . JOINT REPLACEMENT     right hip  . LAPAROSCOPIC GASTRIC RESTRICTIVE DUODENAL PROCEDURE (DUODENAL SWITCH)    . LAPAROSCOPIC GASTRIC SLEEVE RESECTION  08/21/2013  . skin removal surgery     removal of extra skin approx 20 lbs  . STONE EXTRACTION WITH BASKET Right 03/07/2017  Procedure: STONE EXTRACTION WITH BASKET;  Surgeon: Hollice Espy, MD;  Location: [facility];  Service: Urology;  Laterality: Right;  . TONSILLECTOMY AND ADENOIDECTOMY  1960  . TOTAL HIP ARTHROPLASTY Right 01/18/2017   Procedure: TOTAL HIP ARTHROPLASTY ANTERIOR APPROACH;  Surgeon: Hessie Knows, MD;  Location: [facility];  Service: Orthopedics;  Laterality: Right;    Home Medications:  Allergies as of 07/16/2018      Reactions   Atorvastatin Other (See Comments)   Muscle aches.   Pregabalin Other (See  Comments)   Muscle soreness      Medication List       Accurate as of July 16, 2018 11:59 PM. Always use your most recent med list.        aspirin EC 81 MG tablet Take 81 mg by mouth daily.   CRESTOR 10 MG tablet Generic drug:  rosuvastatin Take 10 mg by mouth every other day.   econazole nitrate 1 % cream Apply 1 application topically 2 (two) times daily as needed (for rash).   ibuprofen 600 MG tablet Commonly known as:  ADVIL,MOTRIN Take 1 tablet (600 mg total) by mouth every 6 (six) hours as needed.   lisinopril 40 MG tablet Commonly known as:  PRINIVIL,ZESTRIL Take 40 mg by mouth daily.   multivitamin with minerals Tabs tablet Take 1 tablet by mouth daily.   nystatin cream Commonly known as:  MYCOSTATIN Apply 1 application topically 2 (two) times daily as needed (for rash).   Potassium Citrate 15 MEQ (1620 MG) Tbcr Take 1 tablet by mouth 2 (two) times daily.   sertraline 100 MG tablet Commonly known as:  ZOLOFT Take 1 tablet (100 mg total) by mouth daily.   Vitamin D 50 MCG (2000 UT) Caps Take 2,000 Units by mouth daily.       Allergies:  Allergies  Allergen Reactions  . Atorvastatin Other (See Comments)    Muscle aches.  . Pregabalin Other (See Comments)    Muscle soreness    Family History: Family History  Problem Relation Age of Onset  . Heart attack Mother   . Brain cancer Father   . Heart attack Sister   . Congenital heart disease Sister   . Leukemia Paternal Grandmother   . COPD Brother   . Heart disease Brother   . Prostate cancer Neg Hx   . Kidney cancer Neg Hx   . Bladder Cancer Neg Hx     Social History:  reports that he has never smoked. He has never used smokeless tobacco. He reports that he does not drink alcohol or use drugs.  ROS: UROLOGY Frequent Urination?: No Hard to postpone urination?: No Burning/pain with urination?: No Get up at night to urinate?: No Leakage of urine?: No Urine stream starts and stops?:  No Trouble starting stream?: No Do you have to strain to urinate?: No Blood in urine?: No Urinary tract infection?: No Sexually transmitted disease?: No Injury to kidneys or bladder?: No Painful intercourse?: No Weak stream?: No Erection problems?: Yes Penile pain?: No  Gastrointestinal Nausea?: No Vomiting?: No Indigestion/heartburn?: No Diarrhea?: No Constipation?: No  Constitutional Fever: No Night sweats?: No Weight loss?: No Fatigue?: No  Skin Skin rash/lesions?: No Itching?: No  Eyes Blurred vision?: No Double vision?: No  Ears/Nose/Throat Sore throat?: No Sinus problems?: No  Hematologic/Lymphatic Swollen glands?: No Easy bruising?: No  Cardiovascular Leg swelling?: No Chest pain?: No  Respiratory Cough?: No Shortness of breath?: No  Endocrine Excessive thirst?: No  Musculoskeletal Back pain?:  No Joint pain?: Yes  Neurological Headaches?: No Dizziness?: No  Psychologic Depression?: Yes Anxiety?: No  Physical Exam: BP 132/74 (BP Location: Left Arm, Patient Position: Sitting, Cuff Size: Normal)   Pulse (!) 140   Ht 5\' 7"  (1.702 m)   Wt 249 lb 9.6 oz (113.2 kg)   SpO2 92%   BMI 39.09 kg/m   Constitutional:  Well nourished. Alert and oriented, No acute distress. Cardiovascular: No clubbing, cyanosis, or edema. Respiratory: Normal respiratory effort, no increased work of breathing. Skin: No rashes, bruises or suspicious lesions. Neurologic: Grossly intact, no focal deficits, moving all 4 extremities. Psychiatric: Normal mood and affect.  Laboratory Data: Lab Results  Component Value Date   WBC 8.6 11/03/2017   HGB 12.9 (L) 11/03/2017   HCT 38.5 (L) 11/03/2017   MCV 91.8 11/03/2017   PLT 207 11/03/2017   Lab Results  Component Value Date   CREATININE 0.46 (L) 11/22/2017    Potassium  01/16/2018, 4.0  Urinalysis Results for orders placed or performed in visit on 07/16/18  Microscopic Examination  Result Value Ref Range    WBC, UA None seen 0 - 5 /hpf   RBC, UA 0-2 0 - 2 /hpf   Epithelial Cells (non renal) None seen 0 - 10 /hpf   Bacteria, UA None seen None seen/Few  Urinalysis, Complete  Result Value Ref Range   Specific Gravity, UA 1.025 1.005 - 1.030   pH, UA 5.0 5.0 - 7.5   Color, UA Yellow Yellow   Appearance Ur Clear Clear   Leukocytes, UA Negative Negative   Protein, UA Negative Negative/Trace   Glucose, UA Negative Negative   Ketones, UA Negative Negative   RBC, UA Trace (A) Negative   Bilirubin, UA Negative Negative   Urobilinogen, Ur 0.2 0.2 - 1.0 mg/dL   Nitrite, UA Negative Negative   Microscopic Examination See below:    =  Pertinent Imaging:  CLINICAL DATA:  Follow-up renal stones.  EXAM: ABDOMEN - 1 VIEW  COMPARISON:  01/08/2018  FINDINGS: Not obstructive bowel gas pattern. Large amount of formed stool throughout the colon.  Suspected bilateral calculi overlie the renal shadows, with the largest calculus in the lower pole of the left kidney mid measuring 7 mm.  IMPRESSION: Suspected bilateral nephrolithiasis with the largest calculus in the lower pole of the left kidney measuring 7 mm.  Constipation.   Electronically Signed   By: Fidela Salisbury M.D.   On: 07/15/2018 13:52  I personally reviewed the images and agree with the radiologic interpretation.  Although the patient has past several interval stones, his KUB appears essentially unchanged from 6 months ago.  Assessment & Plan:    1. Recurrent nephrolithiasis - KUB today essentially stable, left greater than right, largest is 7 mm left midpole 7 mm is largest -Given that he is able to pass the stones with minimal to no discomfort, will continue conservative management for the time being - Continue surviellance, increase surviellance interval to 1 year KUB - Continue potassium citrate 15 mEq twice daily -Lengthy review today of dietary modification, stone diet and adequate hydration  2.  ED - Patient is not interested in medical intervention  Return in about 1 year (around 07/17/2019) for KUB before.  [facility] [address], [place], [place] [address] [phone]  I, Adele Schilder, am acting as a scribe for Hollice Espy, MD.    I have reviewed the above documentation for accuracy and completeness, and I agree  with the above.   Hollice Espy, MD

## 2018-07-15 ENCOUNTER — Ambulatory Visit
Admission: RE | Admit: 2018-07-15 | Discharge: 2018-07-15 | Disposition: A | Discharge status: Home / Self Care | Payer: Medicare Other | Source: Ambulatory Visit | Attending: Urology | Admitting: Urology

## 2018-07-15 ENCOUNTER — Ambulatory Visit
Admission: RE | Admit: 2018-07-15 | Discharge: 2018-07-15 | Disposition: A | Discharge status: Home / Self Care | Payer: Medicare Other | Attending: Urology | Admitting: Urology

## 2018-07-15 DIAGNOSIS — N200 Calculus of kidney: Secondary | ICD-10-CM | POA: Diagnosis present

## 2018-07-16 ENCOUNTER — Ambulatory Visit (INDEPENDENT_AMBULATORY_CARE_PROVIDER_SITE_OTHER): Payer: Medicare Other | Admitting: Urology

## 2018-07-16 ENCOUNTER — Encounter: Payer: Self-pay | Admitting: Urology

## 2018-07-16 VITALS — BP 132/74 | HR 140 | Ht 67.0 in | Wt 249.6 lb

## 2018-07-16 DIAGNOSIS — N200 Calculus of kidney: Secondary | ICD-10-CM

## 2018-07-16 LAB — URINALYSIS, COMPLETE
BILIRUBIN UA: NEGATIVE
Glucose, UA: NEGATIVE
KETONES UA: NEGATIVE
LEUKOCYTES UA: NEGATIVE
NITRITE UA: NEGATIVE
PH UA: 5 (ref 5.0–7.5)
Protein, UA: NEGATIVE
SPEC GRAV UA: 1.025 (ref 1.005–1.030)
Urobilinogen, Ur: 0.2 mg/dL (ref 0.2–1.0)

## 2018-07-16 LAB — MICROSCOPIC EXAMINATION
BACTERIA UA: NONE SEEN
EPITHELIAL CELLS (NON RENAL): NONE SEEN /HPF (ref 0–10)
WBC, UA: NONE SEEN /hpf (ref 0–5)

## 2018-07-16 MED ORDER — POTASSIUM CITRATE ER 15 MEQ (1620 MG) PO TBCR: 1.0000 | EXTENDED_RELEASE_TABLET | Freq: Two times a day (BID) | ORAL | 11 refills | Status: DC

## 2018-10-02 ENCOUNTER — Other Ambulatory Visit: Payer: Self-pay | Admitting: Family Medicine

## 2018-12-24 ENCOUNTER — Other Ambulatory Visit: Payer: Self-pay | Admitting: Family Medicine

## 2018-12-25 NOTE — Telephone Encounter (Signed)
L.O.V. was 10/10/2016, please advise.

## 2019-03-12 ENCOUNTER — Ambulatory Visit: Payer: Self-pay

## 2019-03-18 ENCOUNTER — Other Ambulatory Visit: Payer: Self-pay | Admitting: Family Medicine

## 2019-03-19 ENCOUNTER — Other Ambulatory Visit: Payer: Self-pay | Admitting: Family Medicine

## 2019-03-24 ENCOUNTER — Other Ambulatory Visit: Payer: Self-pay | Admitting: Family Medicine

## 2019-03-26 ENCOUNTER — Other Ambulatory Visit: Payer: Self-pay | Admitting: Family Medicine

## 2019-03-28 ENCOUNTER — Other Ambulatory Visit: Payer: Self-pay | Admitting: Family Medicine

## 2019-04-24 ENCOUNTER — Other Ambulatory Visit: Payer: Self-pay | Admitting: Family Medicine

## 2019-04-25 ENCOUNTER — Other Ambulatory Visit: Payer: Self-pay | Admitting: Family Medicine

## 2019-04-28 NOTE — Telephone Encounter (Signed)
Per chart patient has not had a recent visit with [facility].  This medication was refilled by Dr. Natale Milch in 12-2018.  Sending to provider for approval/denial.

## 2019-05-24 IMAGING — CR DG ABDOMEN 1V
2 series · 2 of 2 positions shown · non-contrast
Comparison: 10/01/2017

CLINICAL DATA: Recurrent kidney stones.

EXAM:
ABDOMEN - 1 VIEW

[abdomen kub (1 of 2)]
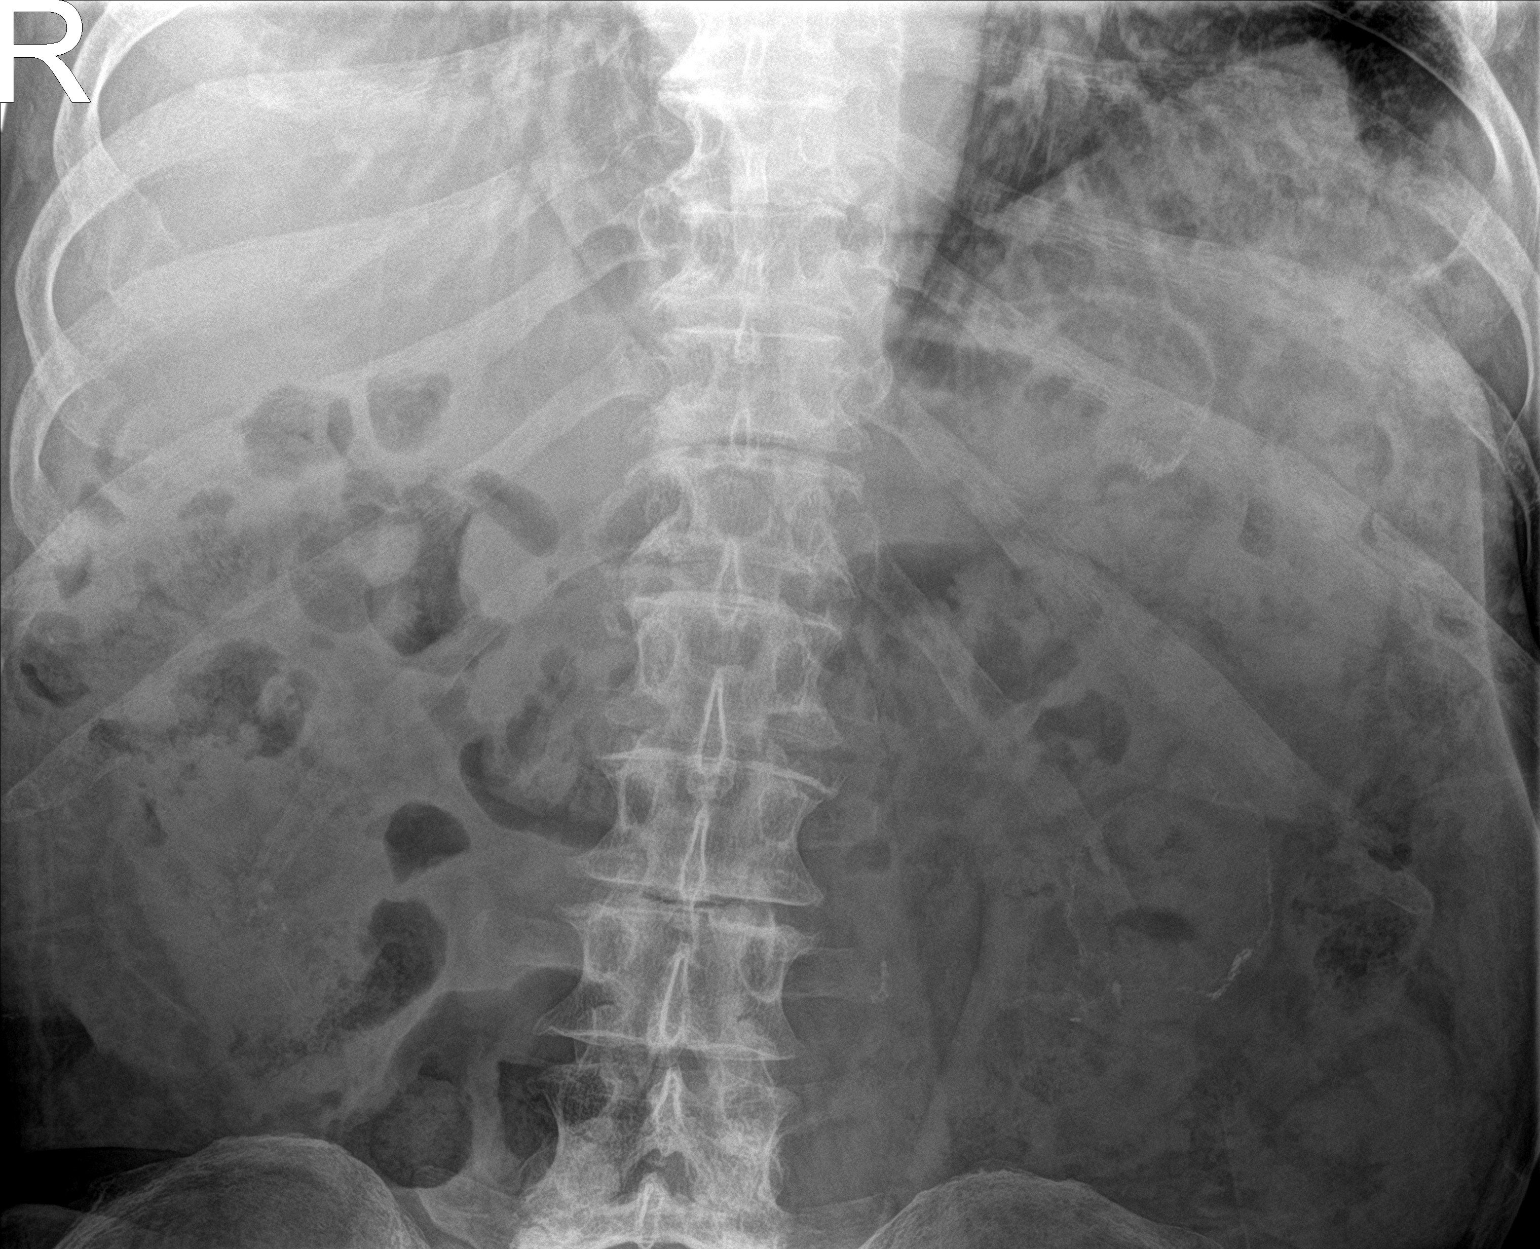

[abdomen kub (2 of 2)]
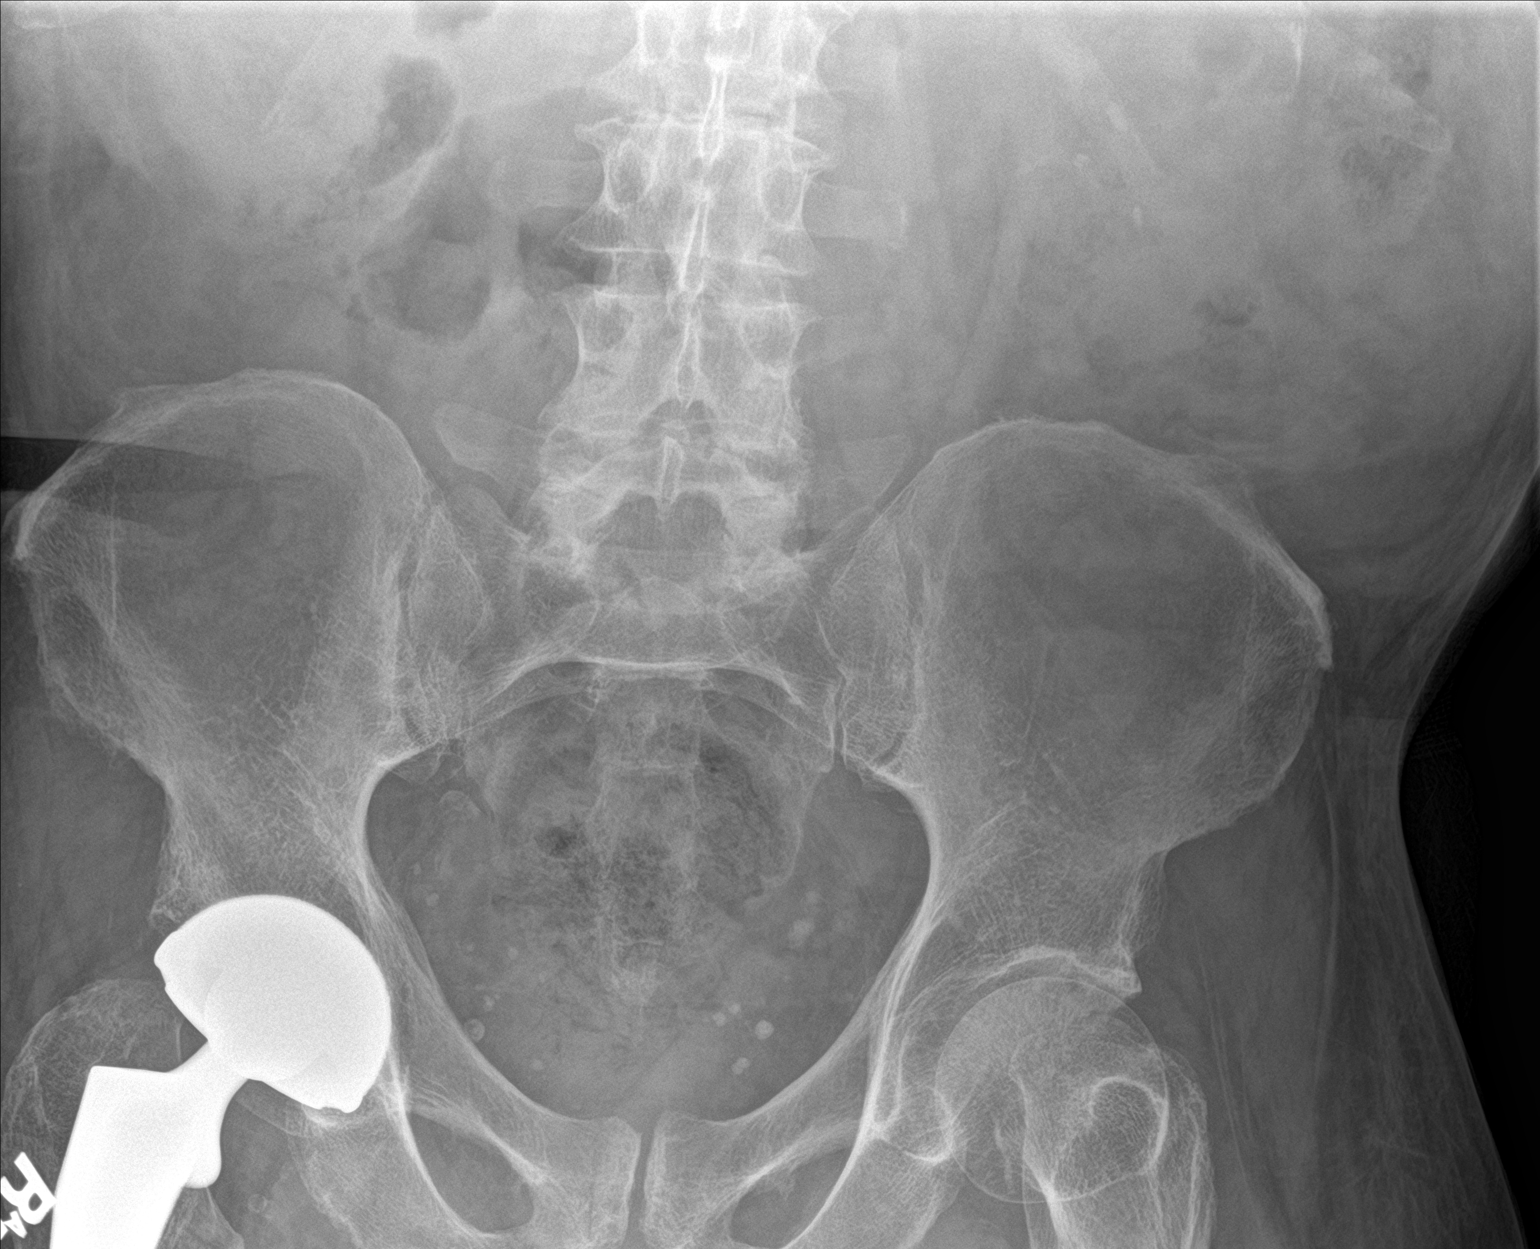

[2 of 2 positions shown; findings below may reference images not displayed]

FINDINGS: Small stones again noted in the left kidney. Suture material is seen
in the left abdomen. The bowel gas pattern is within normal limits.
Patient is status post right hip replacement, incompletely
visualized.
IMPRESSION: Lower pole left renal stones again noted.

## 2019-05-28 ENCOUNTER — Other Ambulatory Visit: Payer: Self-pay | Admitting: Family Medicine

## 2019-05-28 MED ORDER — SERTRALINE HCL 100 MG PO TABS: 100.0000 mg | ORAL_TABLET | Freq: Every day | ORAL | 0 refills | Status: DC

## 2019-05-28 NOTE — Telephone Encounter (Signed)
Advised pt he needs appointment for refills. Pt has made follow up appointment for his meds on 06/12/2019 at 2:20.  However, pt took his last Zoloft today and they need sent in asap to  [facility], [place]. Phone:  [phone]  Fax:  [phone]

## 2019-05-28 NOTE — Telephone Encounter (Signed)
From [facility].  Patient had not been seen since 2018.  He had on the last refill he needed appointment.  Med was refused this morning.   [facility] states he made follow up appointment but, is out of medicine and wants to know if it can be refilled until he is seen

## 2019-05-28 NOTE — Telephone Encounter (Signed)
Requested medication (s) are due for refill today: yes  Requested medication (s) are on the active medication list: yes  Last refill: 04/28/2019  Future visit scheduled: no  Notes to clinic:  no valid encounter within last 6 months Review for refill   Requested Prescriptions  Pending Prescriptions Disp Refills   sertraline (ZOLOFT) 100 MG tablet [Pharmacy Med Name: SERTRALINE HCL 100 MG TAB] 30 tablet     Sig: TAKE 1 TABLET BY MOUTH ONCE DAILY      Psychiatry:  Antidepressants - SSRI Failed - 05/28/2019  9:05 AM      Failed - Completed PHQ-2 or PHQ-9 in the last 360 days.      Failed - Valid encounter within last 6 months    Recent Outpatient Visits           2 years ago Essential (primary) hypertension   [facility], [place], [place]   2 years ago [place], [place], [place]   4 years ago COPD, moderate [facility])   [facility], [place], [place]   4 years ago COPD, moderate [facility])   [facility], [place], [place]   4 years ago COPD, moderate   [facility], [place], [place]       Future Appointments             In 1 month Hollice Espy, MD [place]

## 2019-05-28 NOTE — Telephone Encounter (Signed)
Please advise refill? Patient has appt 06/12/2019.

## 2019-05-28 NOTE — Addendum Note (Signed)
Addended by: Julieta Bellini on: 05/28/2019 02:23 PM   Modules accepted: Orders

## 2019-06-10 ENCOUNTER — Ambulatory Visit: Payer: Medicare Other

## 2019-06-12 ENCOUNTER — Ambulatory Visit: Payer: Medicare Other | Admitting: Family Medicine

## 2019-06-12 NOTE — Progress Notes (Signed)
Patient: William Armstrong Male    DOB: Sep 03, 1950   69 y.o.   MRN: [id number] Visit Date: 06/12/2019  Today's Provider: Trinna Post, PA-C   Chief Complaint  Patient presents with  . Depression   Subjective:    I, Porsha McClurkin,CMA am acting as a Education administrator for [employer].  Virtual Visit via Telephone Note  I connected with William Armstrong on 06/12/19 at  2:00 PM EST by telephone and verified that I am speaking with the correct person using two identifiers.  Location: Patient: Home Provider: Office   I discussed the limitations, risks, security and privacy concerns of performing an evaluation and management service by telephone and the availability of in person appointments. I also discussed with the patient that there may be a patient responsible charge related to this service. The patient expressed understanding and agreed to proceed.  HPI   Depression Patient presents today for depression follow-up. Patient is currently taking Zoloft and reports good compliance with treatment. Patient denies any side effects at the moment.  Patient is receiving blood pressure medicine from cardiologist Dr. Laurance Flatten  Allergies  Allergen Reactions  . Atorvastatin Other (See Comments)    Muscle aches.  . Pregabalin Other (See Comments)    Muscle soreness     Current Outpatient Medications:  .  aspirin EC 81 MG tablet, Take 81 mg by mouth daily., Disp: , Rfl:  .  Cholecalciferol (VITAMIN D) 2000 units CAPS, Take 2,000 Units by mouth daily., Disp: , Rfl:  .  lisinopril (PRINIVIL,ZESTRIL) 40 MG tablet, Take 40 mg by mouth daily. , Disp: , Rfl:  .  Potassium Citrate 15 MEQ (1620 MG) TBCR, Take 1 tablet by mouth 2 (two) times daily., Disp: 60 tablet, Rfl: 11 .  rosuvastatin (CRESTOR) 10 MG tablet, Take 10 mg by mouth every other day. , Disp: , Rfl:  .  sertraline (ZOLOFT) 100 MG tablet, Take 1 tablet (100 mg total) by mouth daily., Disp: 90 tablet, Rfl: 0 .  econazole nitrate 1 %  cream, Apply 1 application topically 2 (two) times daily as needed (for rash). , Disp: , Rfl:  .  ibuprofen (ADVIL,MOTRIN) 600 MG tablet, Take 1 tablet (600 mg total) by mouth every 6 (six) hours as needed., Disp: 30 tablet, Rfl: 0 .  Multiple Vitamin (MULTIVITAMIN WITH MINERALS) TABS tablet, Take 1 tablet by mouth daily., Disp: , Rfl:  .  nystatin cream (MYCOSTATIN), Apply 1 application topically 2 (two) times daily as needed (for rash). , Disp: , Rfl:   Review of Systems  Constitutional: Negative.  Negative for fever.  Respiratory: Negative for shortness of breath.   Cardiovascular: Negative for chest pain, palpitations and leg swelling.  Neurological: Positive for dizziness, light-headedness and headaches.  Psychiatric/Behavioral: Negative for decreased concentration. The patient is nervous/anxious.     Social History   Tobacco Use  . Smoking status: Never Smoker  . Smokeless tobacco: Never Used  Substance Use Topics  . Alcohol use: No    Alcohol/week: 0.0 standard drinks      Objective:   There were no vitals taken for this visit. There were no vitals filed for this visit.There is no height or weight on file to calculate BMI.   Physical Exam   No results found for any visits on 06/13/19.     Assessment & Plan    1. Essential hypertension  Filled by cardiology.   2. H/O: depression  Zoloft 100 mg daily,  has been on this for many years. Will refill x 1 year.   3. Hyperlipidemia, unspecified hyperlipidemia type   I discussed the assessment and treatment plan with the patient. The patient was provided an opportunity to ask questions and all were answered. The patient agreed with the plan and demonstrated an understanding of the instructions.   The patient was advised to call back or seek an in-person evaluation if the symptoms worsen or if the condition fails to improve as anticipated.  I provided 15 minutes of non-face-to-face time during this encounter.      Trinna Post, PA-C  [facility]

## 2019-06-13 ENCOUNTER — Other Ambulatory Visit: Payer: Self-pay

## 2019-06-13 ENCOUNTER — Ambulatory Visit (INDEPENDENT_AMBULATORY_CARE_PROVIDER_SITE_OTHER): Payer: Medicare Other | Admitting: Physician Assistant

## 2019-06-13 DIAGNOSIS — I10 Essential (primary) hypertension: Secondary | ICD-10-CM

## 2019-06-13 DIAGNOSIS — E785 Hyperlipidemia, unspecified: Secondary | ICD-10-CM

## 2019-06-13 DIAGNOSIS — Z8659 Personal history of other mental and behavioral disorders: Secondary | ICD-10-CM | POA: Diagnosis not present

## 2019-06-13 MED ORDER — SERTRALINE HCL 100 MG PO TABS: 100.0000 mg | ORAL_TABLET | Freq: Every day | ORAL | 3 refills | Status: AC

## 2019-07-21 ENCOUNTER — Ambulatory Visit
Admission: RE | Admit: 2019-07-21 | Discharge: 2019-07-21 | Disposition: A | Discharge status: Home / Self Care | Payer: Medicare Other | Attending: Urology | Admitting: Urology

## 2019-07-21 ENCOUNTER — Ambulatory Visit
Admission: RE | Admit: 2019-07-21 | Discharge: 2019-07-21 | Disposition: A | Discharge status: Home / Self Care | Payer: Medicare Other | Source: Ambulatory Visit | Attending: Urology | Admitting: Urology

## 2019-07-21 ENCOUNTER — Other Ambulatory Visit: Payer: Self-pay

## 2019-07-21 DIAGNOSIS — N200 Calculus of kidney: Secondary | ICD-10-CM

## 2019-07-23 ENCOUNTER — Encounter: Payer: Self-pay | Admitting: Urology

## 2019-07-23 ENCOUNTER — Ambulatory Visit (INDEPENDENT_AMBULATORY_CARE_PROVIDER_SITE_OTHER): Payer: Medicare Other | Admitting: Urology

## 2019-07-23 ENCOUNTER — Other Ambulatory Visit: Payer: Self-pay

## 2019-07-23 VITALS — BP 130/72 | HR 58 | Ht 67.0 in | Wt 265.0 lb

## 2019-07-23 DIAGNOSIS — N200 Calculus of kidney: Secondary | ICD-10-CM

## 2019-07-23 NOTE — Progress Notes (Signed)
07/23/2019 3:35 PM   [name] [birth year] [id number]  Referring provider: [name], [place],  [place] [address]  Chief Complaint  Patient presents with  . Nephrolithiasis    1 year follow up    HPI: 69 yo M with recurrent nephrolithiasis who returns today for routine annual f/u.  Over past 12 months, has passed 2 stones, largest 4 mm.  These were with minimal pain.  No current flank pain.    Stone analysis showed 90% calcium oxalate monohydreate, 5% calcium oxalate dihydrate, 5% calcium phosphate.  KUB stable today from previous year  Currently taking KCitrate 15 mgEq twice daily- BMP stable 1/21  Drinking ample amounts of water.  Following stone diet.  This guy can get check   PMH: Past Medical History:  Diagnosis Date  . Anemia   . Arthritis   . BCC (basal cell carcinoma of skin)   . CHF (congestive heart failure) ([place])   . Complication of anesthesia    neck hurt for 2-3 days after general anesthesia  . COPD (chronic obstructive pulmonary disease) ([place])   . Coronary artery disease   . Depression   . Glaucoma   . Heart attack ([place])    2013  . History of kidney stones   . HTN (hypertension)   . Hyperglycemia    pt denies  . Hypertension   . Hypokalemia   . Obesity    morbid  . Sleep apnea    osa, CPAP has been dc'd after 249 lb weight loss    Surgical History: Past Surgical History:  Procedure Laterality Date  . CORONARY STENT PLACEMENT  02/2012  . CYSTOSCOPY W/ RETROGRADES Left 11/18/2016   Procedure: CYSTOSCOPY WITH RETROGRADE PYELOGRAM;  Surgeon: Irine Seal, MD;  Location: [facility];  Service: Urology;  Laterality: Left;  . CYSTOSCOPY WITH STENT PLACEMENT Left 11/18/2016   Procedure: CYSTOSCOPY WITH STENT PLACEMENT;  Surgeon: Irine Seal, MD;  Location: [facility];  Service: Urology;  Laterality: Left;  . CYSTOSCOPY/URETEROSCOPY/HOLMIUM LASER/STENT PLACEMENT Left 11/29/2016   Procedure: CYSTOSCOPY/URETEROSCOPY/HOLMIUM  LASER/STENT EXCHANGE;  Surgeon: Hollice Espy, MD;  Location: [facility];  Service: Urology;  Laterality: Left;  . CYSTOSCOPY/URETEROSCOPY/HOLMIUM LASER/STENT PLACEMENT Right 03/07/2017   Procedure: CYSTOSCOPY/URETEROSCOPY/HOLMIUM LASER/STENT PLACEMENT;  Surgeon: Hollice Espy, MD;  Location: [facility];  Service: Urology;  Laterality: Right;  . JOINT REPLACEMENT     right hip  . LAPAROSCOPIC GASTRIC RESTRICTIVE DUODENAL PROCEDURE (DUODENAL SWITCH)    . LAPAROSCOPIC GASTRIC SLEEVE RESECTION  08/21/2013  . skin removal surgery     removal of extra skin approx 20 lbs  . STONE EXTRACTION WITH BASKET Right 03/07/2017   Procedure: STONE EXTRACTION WITH BASKET;  Surgeon: Hollice Espy, MD;  Location: [facility];  Service: Urology;  Laterality: Right;  . TONSILLECTOMY AND ADENOIDECTOMY  1960  . TOTAL HIP ARTHROPLASTY Right 01/18/2017   Procedure: TOTAL HIP ARTHROPLASTY ANTERIOR APPROACH;  Surgeon: Hessie Knows, MD;  Location: [facility];  Service: Orthopedics;  Laterality: Right;    Home Medications:  Allergies as of 07/23/2019      Reactions   Atorvastatin Other (See Comments)   Muscle aches.   Pregabalin Other (See Comments)   Muscle soreness      Medication List       Accurate as of July 23, 2019  3:35 PM. If you have any questions, ask your nurse or doctor.        STOP taking these medications   spironolactone 25 MG tablet Commonly  known as: ALDACTONE Stopped by: Hollice Espy, MD     TAKE these medications   aspirin EC 81 MG tablet Take 81 mg by mouth daily.   Crestor 10 MG tablet Generic drug: rosuvastatin Take 10 mg by mouth every other day.   lisinopril 40 MG tablet Commonly known as: ZESTRIL Take 40 mg by mouth daily.   Potassium Citrate 15 MEQ (1620 MG) Tbcr Take 1 tablet by mouth 2 (two) times daily.   sertraline 100 MG tablet Commonly known as: ZOLOFT Take 1 tablet (100 mg total) by mouth daily.   Vitamin D 50 MCG (2000 UT) Caps Take 2,000 Units by mouth  daily. What changed: Another medication with the same name was removed. Continue taking this medication, and follow the directions you see here. Changed by: Hollice Espy, MD       Allergies:  Allergies  Allergen Reactions  . Atorvastatin Other (See Comments)    Muscle aches.  . Pregabalin Other (See Comments)    Muscle soreness    Family History: Family History  Problem Relation Age of Onset  . Heart attack Mother   . Brain cancer Father   . Heart attack Sister   . Congenital heart disease Sister   . Leukemia Paternal Grandmother   . COPD Brother   . Heart disease Brother   . Prostate cancer Neg Hx   . Kidney cancer Neg Hx   . Bladder Cancer Neg Hx     Social History:  reports that he has never smoked. He has never used smokeless tobacco. He reports that he does not drink alcohol or use drugs.   Physical Exam: BP 130/72   Pulse (!) 58   Ht 5\' 7"  (1.702 m)   Wt 265 lb (120.2 kg)   BMI 41.50 kg/m   Constitutional:  Alert and oriented, No acute distress. HEENT: [place] AT, moist mucus membranes.  Trachea midline, no masses. Cardiovascular: No clubbing, cyanosis, or edema. Respiratory: Normal respiratory effort, no increased work of breathing. Skin: No rashes, bruises or suspicious lesions. Neurologic: Grossly intact, no focal deficits, moving all 4 extremities. Psychiatric: Normal mood and affect.  Laboratory Data: Lab Results  Component Value Date   WBC 8.6 11/03/2017   HGB 12.9 (L) 11/03/2017   HCT 38.5 (L) 11/03/2017   MCV 91.8 11/03/2017   PLT 207 11/03/2017    Lab Results  Component Value Date   CREATININE 0.46 (L) 11/22/2017    Lab Results  Component Value Date   HGBA1C 5.7 01/21/2013    Imaging: Results for orders placed during the hospital encounter of 07/21/19  Abdomen 1 view (KUB)   Narrative CLINICAL DATA:  Recurrent nephrolithiasis.  EXAM: ABDOMEN - 1 VIEW  COMPARISON:  Radiographs dated 07/15/2018 and 01/08/2018 and CT scan dated  11/18/2016  FINDINGS: There is a 4 mm stone in the lower pole of the left kidney. No definable stones in the right kidney. Numerous phleboliths in the pelvis.  There is an oblong 9 mm calcification overlying the mid left kidney. The CT scan of 12/18/2016 demonstrates that this is a benign bone island in the left twelfth rib.  Bowel gas pattern is normal. Surgical staples in the left upper quadrant previous gastric surgery.  No acute bone abnormality. Right total hip arthroplasty.  IMPRESSION: Single stable small stone in the lower pole of the left kidney.   Electronically Signed   By: Lorriane Shire M.D.   On: 07/21/2019 16:17    Kub personally reviewed today  Assessment & Plan:    1. Recurrent nephrolithiasis Stable left nonobstructing stones  He has been able to pass stone spontaneously without any difficulty.  Following stone diet, tolerating to potassium citrate, will continue this medication- BMP appropriate  Reassess in 1 year or sooner as needed - Abdomen 1 view (KUB); Future   Return in about 1 year (around 07/22/2020) for KUB .  Hollice Espy, MD  [facility] [address] [place], [place] [address] [phone]

## 2019-08-19 ENCOUNTER — Other Ambulatory Visit: Payer: Self-pay | Admitting: Urology

## 2019-08-19 DIAGNOSIS — N200 Calculus of kidney: Secondary | ICD-10-CM

## 2019-11-09 ENCOUNTER — Emergency Department: Payer: Medicare Other

## 2019-11-09 ENCOUNTER — Inpatient Hospital Stay
Admission: EM | Admit: 2019-11-09 | Discharge: 2019-11-11 | DRG: 246 | Disposition: A | Discharge status: Home / Self Care | Payer: Medicare Other | Attending: Internal Medicine | Admitting: Internal Medicine

## 2019-11-09 ENCOUNTER — Other Ambulatory Visit: Payer: Self-pay

## 2019-11-09 DIAGNOSIS — Z825 Family history of asthma and other chronic lower respiratory diseases: Secondary | ICD-10-CM

## 2019-11-09 DIAGNOSIS — R079 Chest pain, unspecified: Secondary | ICD-10-CM | POA: Diagnosis not present

## 2019-11-09 DIAGNOSIS — Z888 Allergy status to other drugs, medicaments and biological substances status: Secondary | ICD-10-CM

## 2019-11-09 DIAGNOSIS — I110 Hypertensive heart disease with heart failure: Secondary | ICD-10-CM | POA: Diagnosis present

## 2019-11-09 DIAGNOSIS — R778 Other specified abnormalities of plasma proteins: Secondary | ICD-10-CM | POA: Diagnosis present

## 2019-11-09 DIAGNOSIS — I10 Essential (primary) hypertension: Secondary | ICD-10-CM | POA: Diagnosis present

## 2019-11-09 DIAGNOSIS — Z9884 Bariatric surgery status: Secondary | ICD-10-CM

## 2019-11-09 DIAGNOSIS — I214 Non-ST elevation (NSTEMI) myocardial infarction: Secondary | ICD-10-CM

## 2019-11-09 DIAGNOSIS — R001 Bradycardia, unspecified: Secondary | ICD-10-CM | POA: Diagnosis present

## 2019-11-09 DIAGNOSIS — F329 Major depressive disorder, single episode, unspecified: Secondary | ICD-10-CM | POA: Diagnosis present

## 2019-11-09 DIAGNOSIS — Z6841 Body Mass Index (BMI) 40.0 and over, adult: Secondary | ICD-10-CM

## 2019-11-09 DIAGNOSIS — Z8249 Family history of ischemic heart disease and other diseases of the circulatory system: Secondary | ICD-10-CM

## 2019-11-09 DIAGNOSIS — E785 Hyperlipidemia, unspecified: Secondary | ICD-10-CM | POA: Diagnosis present

## 2019-11-09 DIAGNOSIS — D649 Anemia, unspecified: Secondary | ICD-10-CM | POA: Diagnosis present

## 2019-11-09 DIAGNOSIS — T82855A Stenosis of coronary artery stent, initial encounter: Principal | ICD-10-CM | POA: Diagnosis present

## 2019-11-09 DIAGNOSIS — J449 Chronic obstructive pulmonary disease, unspecified: Secondary | ICD-10-CM | POA: Diagnosis present

## 2019-11-09 DIAGNOSIS — Z85828 Personal history of other malignant neoplasm of skin: Secondary | ICD-10-CM

## 2019-11-09 DIAGNOSIS — I252 Old myocardial infarction: Secondary | ICD-10-CM

## 2019-11-09 DIAGNOSIS — Z79899 Other long term (current) drug therapy: Secondary | ICD-10-CM

## 2019-11-09 DIAGNOSIS — H409 Unspecified glaucoma: Secondary | ICD-10-CM | POA: Diagnosis present

## 2019-11-09 DIAGNOSIS — I2510 Atherosclerotic heart disease of native coronary artery without angina pectoris: Secondary | ICD-10-CM | POA: Diagnosis present

## 2019-11-09 DIAGNOSIS — G4733 Obstructive sleep apnea (adult) (pediatric): Secondary | ICD-10-CM | POA: Diagnosis present

## 2019-11-09 DIAGNOSIS — Z20822 Contact with and (suspected) exposure to covid-19: Secondary | ICD-10-CM | POA: Diagnosis present

## 2019-11-09 DIAGNOSIS — Y831 Surgical operation with implant of artificial internal device as the cause of abnormal reaction of the patient, or of later complication, without mention of misadventure at the time of the procedure: Secondary | ICD-10-CM | POA: Diagnosis present

## 2019-11-09 DIAGNOSIS — Z96641 Presence of right artificial hip joint: Secondary | ICD-10-CM | POA: Diagnosis present

## 2019-11-09 DIAGNOSIS — Z955 Presence of coronary angioplasty implant and graft: Secondary | ICD-10-CM

## 2019-11-09 DIAGNOSIS — I5022 Chronic systolic (congestive) heart failure: Secondary | ICD-10-CM | POA: Diagnosis present

## 2019-11-09 DIAGNOSIS — Z7982 Long term (current) use of aspirin: Secondary | ICD-10-CM

## 2019-11-09 DIAGNOSIS — I452 Bifascicular block: Secondary | ICD-10-CM | POA: Diagnosis present

## 2019-11-09 HISTORY — DX: Non-ST elevation (NSTEMI) myocardial infarction: I21.4

## 2019-11-09 LAB — CBC
HCT: 40.2 % (ref 39.0–52.0)
Hemoglobin: 13 g/dL (ref 13.0–17.0)
MCH: 29.8 pg (ref 26.0–34.0)
MCHC: 32.3 g/dL (ref 30.0–36.0)
MCV: 92.2 fL (ref 80.0–100.0)
Platelets: 214 10*3/uL (ref 150–400)
RBC: 4.36 MIL/uL (ref 4.22–5.81)
RDW: 13.7 % (ref 11.5–15.5)
WBC: 7.6 10*3/uL (ref 4.0–10.5)
nRBC: 0 % (ref 0.0–0.2)

## 2019-11-09 LAB — TROPONIN I (HIGH SENSITIVITY)
Troponin I (High Sensitivity): 6 ng/L (ref <18)
Troponin I (High Sensitivity): 38 ng/L — ABNORMAL HIGH (ref <18)
Troponin I (High Sensitivity): 1514 ng/L (ref <18)
Troponin I (High Sensitivity): 2938 ng/L (ref <18)

## 2019-11-09 LAB — BASIC METABOLIC PANEL
Anion gap: 9 (ref 5–15)
BUN: 18 mg/dL (ref 8–23)
CO2: 24 mmol/L (ref 22–32)
Calcium: 8.6 mg/dL — ABNORMAL LOW (ref 8.9–10.3)
Chloride: 106 mmol/L (ref 98–111)
Creatinine, Ser: 0.58 mg/dL — ABNORMAL LOW (ref 0.61–1.24)
GFR calc Af Amer: >60 mL/min (ref >60)
GFR calc non Af Amer: >60 mL/min (ref >60)
Glucose, Bld: 126 mg/dL — ABNORMAL HIGH (ref 70–99)
Potassium: 3.8 mmol/L (ref 3.5–5.1)
Sodium: 139 mmol/L (ref 135–145)

## 2019-11-09 LAB — APTT: aPTT: 29 seconds (ref 24–36)

## 2019-11-09 LAB — URINE DRUG SCREEN, QUALITATIVE (ARMC ONLY)
Amphetamines, Ur Screen: NOT DETECTED
Barbiturates, Ur Screen: NOT DETECTED
Benzodiazepine, Ur Scrn: NOT DETECTED
Cannabinoid 50 Ng, Ur ~~LOC~~: NOT DETECTED
Cocaine Metabolite,Ur ~~LOC~~: NOT DETECTED
MDMA (Ecstasy)Ur Screen: NOT DETECTED
Methadone Scn, Ur: NOT DETECTED
Opiate, Ur Screen: NOT DETECTED
Phencyclidine (PCP) Ur S: NOT DETECTED
Tricyclic, Ur Screen: NOT DETECTED

## 2019-11-09 LAB — SARS CORONAVIRUS 2 BY RT PCR (HOSPITAL ORDER, PERFORMED IN ~~LOC~~ HOSPITAL LAB): SARS Coronavirus 2: NEGATIVE

## 2019-11-09 LAB — PROTIME-INR
INR: 1 (ref 0.8–1.2)
Prothrombin Time: 12.9 seconds (ref 11.4–15.2)

## 2019-11-09 LAB — BRAIN NATRIURETIC PEPTIDE: B Natriuretic Peptide: 51.3 pg/mL (ref 0.0–100.0)

## 2019-11-09 MED ORDER — SERTRALINE HCL 50 MG PO TABS
100.0000 mg | ORAL_TABLET | Freq: Every day | ORAL | Status: DC
  Administered 2019-11-11: 100 mg via ORAL
  Filled 2019-11-09: qty 2

## 2019-11-09 MED ORDER — ASPIRIN 81 MG PO CHEW
324.0000 mg | CHEWABLE_TABLET | Freq: Once | ORAL | Status: AC
  Administered 2019-11-09: 324 mg via ORAL
  Filled 2019-11-09: qty 4

## 2019-11-09 MED ORDER — ALBUTEROL SULFATE (2.5 MG/3ML) 0.083% IN NEBU: 2.5000 mg | INHALATION_SOLUTION | RESPIRATORY_TRACT | Status: DC | PRN

## 2019-11-09 MED ORDER — HYDRALAZINE HCL 20 MG/ML IJ SOLN: 5.0000 mg | INTRAMUSCULAR | Status: DC | PRN

## 2019-11-09 MED ORDER — ASPIRIN 81 MG PO CHEW: 324.0000 mg | CHEWABLE_TABLET | Freq: Every day | ORAL | Status: DC

## 2019-11-09 MED ORDER — MORPHINE SULFATE (PF) 2 MG/ML IV SOLN: 2.0000 mg | INTRAVENOUS | Status: DC | PRN

## 2019-11-09 MED ORDER — HEPARIN BOLUS VIA INFUSION
4000.0000 [IU] | Freq: Once | INTRAVENOUS | Status: AC
  Administered 2019-11-09: 4000 [IU] via INTRAVENOUS
  Filled 2019-11-09: qty 4000

## 2019-11-09 MED ORDER — SPIRONOLACTONE 25 MG PO TABS
25.0000 mg | ORAL_TABLET | Freq: Every day | ORAL | Status: DC
  Administered 2019-11-10 – 2019-11-11 (×2): 25 mg via ORAL
  Filled 2019-11-09 (×2): qty 1

## 2019-11-09 MED ORDER — HEPARIN (PORCINE) 25000 UT/250ML-% IV SOLN
1300.0000 [IU]/h | INTRAVENOUS | Status: DC
  Administered 2019-11-09: 1300 [IU]/h via INTRAVENOUS
  Filled 2019-11-09: qty 250

## 2019-11-09 MED ORDER — LISINOPRIL 20 MG PO TABS: 40.0000 mg | ORAL_TABLET | Freq: Every day | ORAL | Status: DC

## 2019-11-09 MED ORDER — NITROGLYCERIN 0.4 MG SL SUBL
0.4000 mg | SUBLINGUAL_TABLET | Freq: Once | SUBLINGUAL | Status: AC
  Administered 2019-11-09: 0.4 mg via SUBLINGUAL
  Filled 2019-11-09: qty 1

## 2019-11-09 MED ORDER — ROSUVASTATIN CALCIUM 10 MG PO TABS
10.0000 mg | ORAL_TABLET | Freq: Every day | ORAL | Status: DC
  Administered 2019-11-09: 10 mg via ORAL
  Filled 2019-11-09: qty 1

## 2019-11-09 MED ORDER — VITAMIN D 25 MCG (1000 UNIT) PO TABS
2000.0000 [IU] | ORAL_TABLET | Freq: Every day | ORAL | Status: DC
  Administered 2019-11-11: 2000 [IU] via ORAL
  Filled 2019-11-09: qty 2

## 2019-11-09 MED ORDER — LISINOPRIL 20 MG PO TABS
40.0000 mg | ORAL_TABLET | Freq: Every day | ORAL | Status: DC
  Administered 2019-11-09 – 2019-11-11 (×2): 40 mg via ORAL
  Filled 2019-11-09: qty 2
  Filled 2019-11-09: qty 4

## 2019-11-09 MED ORDER — SODIUM CHLORIDE 0.9 % IV SOLN: INTRAVENOUS | Status: DC

## 2019-11-09 MED ORDER — ROSUVASTATIN CALCIUM 10 MG PO TABS: 10.0000 mg | ORAL_TABLET | ORAL | Status: DC

## 2019-11-09 MED ORDER — ACETAMINOPHEN 325 MG PO TABS: 650.0000 mg | ORAL_TABLET | Freq: Four times a day (QID) | ORAL | Status: DC | PRN

## 2019-11-09 MED ORDER — NITROGLYCERIN 0.4 MG SL SUBL: 0.4000 mg | SUBLINGUAL_TABLET | SUBLINGUAL | Status: DC | PRN

## 2019-11-09 MED ORDER — LATANOPROST 0.005 % OP SOLN
1.0000 [drp] | Freq: Every day | OPHTHALMIC | Status: DC
  Administered 2019-11-09 – 2019-11-10 (×2): 1 [drp] via OPHTHALMIC
  Filled 2019-11-09: qty 2.5

## 2019-11-09 MED ORDER — ALBUTEROL SULFATE HFA 108 (90 BASE) MCG/ACT IN AERS: 2.0000 | INHALATION_SPRAY | RESPIRATORY_TRACT | Status: DC | PRN

## 2019-11-09 NOTE — ED Triage Notes (Signed)
First nurse note- here for CP. Pulled for EKG

## 2019-11-09 NOTE — ED Notes (Signed)
This RN attempted to call report. Charge RN is taking pt and is not able to answer phone at this time. This RN left number for receiving RN to call back. If no call back within 15 minutes, RN will transport pt to floor.

## 2019-11-09 NOTE — H&P (Signed)
History and Physical    William Armstrong QMG:[id number] DOB: Sep 05, 1950 DOA: 11/09/2019  Referring MD/NP/PA:   PCP: [name], PA   Patient coming from:  The patient is coming from home.  At baseline, pt is independent for most of ADL.        Chief Complaint: chest pain  HPI: William Armstrong is a 69 y.o. male with medical history significant of hypertension, hyperlipidemia, COPD, depression, CAD, myocardial infarction, stent placement, CHF with EF of 40%, anemia, OSA, who presents with chest pain.  Patient states that his chest pain started around noon, which is located in the left side of the chest, initially 7 out of 10 severity, currently 2 out of 10 severity, dull, radiating to the left side of back.  Associated with diaphoresis.  No shortness of breath, cough, fever or chills.  Patient has nausea, but no vomiting, diarrhea, abdominal pain, symptoms of UTI or unilateral weakness.  ED Course: pt was found to have trop 6 -->38, INR 1.0, PTT 29, negative COVID-19 PCR, electrolytes renal function okay, temperature normal, blood pressure 172/84, heart rate 81, RR 21, oxygen saturation 95% on room air.  Chest x-ray negative.  Patient is placed on progressive bed for observation.  Cardiology, Dr. Ubaldo Glassing is consulted.  Review of Systems:   General: no fevers, chills, no body weight gain, has fatigue HEENT: no blurry vision, hearing changes or sore throat Respiratory: no dyspnea, coughing, wheezing CV: has chest pain, no palpitations GI: has nausea, no vomiting, abdominal pain, diarrhea, constipation GU: no dysuria, burning on urination, increased urinary frequency, hematuria  Ext: no leg edema Neuro: no unilateral weakness, numbness, or tingling, no vision change or hearing loss Skin: no rash, no skin tear. MSK: No muscle spasm, no deformity, no limitation of range of movement in spin Heme: No easy bruising.  Travel history: No recent long distant travel.  Allergy:  Allergies    Allergen Reactions  . Atorvastatin Other (See Comments)    Muscle aches. Other reaction(s): Other (See Comments) Muscle aches. Muscle pain  . Pregabalin Other (See Comments)    Muscle soreness Other reaction(s): Other (See Comments) Muscle soreness Severe muscle pain    Past Medical History:  Diagnosis Date  . Anemia   . Arthritis   . BCC (basal cell carcinoma of skin)   . CHF (congestive heart failure) ([place])   . Complication of anesthesia    neck hurt for 2-3 days after general anesthesia  . COPD (chronic obstructive pulmonary disease) ([place])   . Coronary artery disease   . Depression   . Glaucoma   . Heart attack ([place])    2013  . History of kidney stones   . HTN (hypertension)   . Hyperglycemia    pt denies  . Hypertension   . Hypokalemia   . Obesity    morbid  . Sleep apnea    osa, CPAP has been dc'd after 249 lb weight loss    Past Surgical History:  Procedure Laterality Date  . CORONARY STENT PLACEMENT  02/2012  . CYSTOSCOPY W/ RETROGRADES Left 11/18/2016   Procedure: CYSTOSCOPY WITH RETROGRADE PYELOGRAM;  Surgeon: Irine Seal, MD;  Location: [facility];  Service: Urology;  Laterality: Left;  . CYSTOSCOPY WITH STENT PLACEMENT Left 11/18/2016   Procedure: CYSTOSCOPY WITH STENT PLACEMENT;  Surgeon: Irine Seal, MD;  Location: [facility];  Service: Urology;  Laterality: Left;  . CYSTOSCOPY/URETEROSCOPY/HOLMIUM LASER/STENT PLACEMENT Left 11/29/2016   Procedure: CYSTOSCOPY/URETEROSCOPY/HOLMIUM LASER/STENT EXCHANGE;  Surgeon: Erlene Quan,  Caryl Pina, MD;  Location: [facility];  Service: Urology;  Laterality: Left;  . CYSTOSCOPY/URETEROSCOPY/HOLMIUM LASER/STENT PLACEMENT Right 03/07/2017   Procedure: CYSTOSCOPY/URETEROSCOPY/HOLMIUM LASER/STENT PLACEMENT;  Surgeon: Hollice Espy, MD;  Location: [facility];  Service: Urology;  Laterality: Right;  . JOINT REPLACEMENT     right hip  . LAPAROSCOPIC GASTRIC RESTRICTIVE DUODENAL PROCEDURE (DUODENAL SWITCH)    . LAPAROSCOPIC GASTRIC SLEEVE  RESECTION  08/21/2013  . skin removal surgery     removal of extra skin approx 20 lbs  . STONE EXTRACTION WITH BASKET Right 03/07/2017   Procedure: STONE EXTRACTION WITH BASKET;  Surgeon: Hollice Espy, MD;  Location: [facility];  Service: Urology;  Laterality: Right;  . TONSILLECTOMY AND ADENOIDECTOMY  1960  . TOTAL HIP ARTHROPLASTY Right 01/18/2017   Procedure: TOTAL HIP ARTHROPLASTY ANTERIOR APPROACH;  Surgeon: Hessie Knows, MD;  Location: [facility];  Service: Orthopedics;  Laterality: Right;    Social History:  reports that he has never smoked. He has never used smokeless tobacco. He reports that he does not drink alcohol or use drugs.  Family History:  Family History  Problem Relation Age of Onset  . Heart attack Mother   . Brain cancer Father   . Heart attack Sister   . Congenital heart disease Sister   . Leukemia Paternal Grandmother   . COPD Brother   . Heart disease Brother   . Prostate cancer Neg Hx   . Kidney cancer Neg Hx   . Bladder Cancer Neg Hx      Prior to Admission medications   Medication Sig Start Date End Date Taking? Authorizing Provider  aspirin EC 81 MG tablet Take 81 mg by mouth daily.    [provider]  Cholecalciferol (VITAMIN D) 2000 units CAPS Take 2,000 Units by mouth daily.    [provider]  lisinopril (PRINIVIL,ZESTRIL) 40 MG tablet Take 40 mg by mouth daily.  08/19/16   [provider]  Potassium Citrate 15 MEQ (1620 MG) TBCR Take 1 tablet by mouth 2 (two) times daily. 08/20/19   Hollice Espy, MD  rosuvastatin (CRESTOR) 10 MG tablet Take 10 mg by mouth every other day.     [provider]  sertraline (ZOLOFT) 100 MG tablet Take 1 tablet (100 mg total) by mouth daily. 06/13/19   Trinna Post, PA-C  spironolactone (ALDACTONE) 25 MG tablet Take 25 mg by mouth daily. 10/16/19   [provider]    Physical Exam: Vitals:   11/09/19 1530 11/09/19 1630 11/09/19 1645 11/09/19 1655  BP: (!) 126/59 (!)  141/49 (!) 135/49 (!) 140/56  Pulse: 97 (!) 45 (!) 47 (!) 46  Resp: 20 (!) 21 (!) 21 19  Temp:      TempSrc:      SpO2: 95% 98% 96% 97%  Weight:      Height:       General: Not in acute distress HEENT:       Eyes: PERRL, EOMI, no scleral icterus.       ENT: No discharge from the ears and nose, no pharynx injection, no tonsillar enlargement.        Neck: No JVD, no bruit, no mass felt. Heme: No neck lymph node enlargement. Cardiac: S1/S2, RRR, No murmurs, No gallops or rubs. Respiratory:  No rales, wheezing, rhonchi or rubs. GI: Soft, nondistended, nontender, no rebound pain, no organomegaly, BS present. GU: No hematuria Ext: No pitting leg edema bilaterally. 2+DP/PT pulse bilaterally. Musculoskeletal: No joint deformities, No joint redness or warmth,  no limitation of ROM in spin. Skin: No rashes.  Neuro: Alert, oriented X3, cranial nerves II-XII grossly intact, moves all extremities normally. Psych: Patient is not psychotic, no suicidal or hemocidal ideation.  Labs on Admission: I have personally reviewed following labs and imaging studies  CBC: Recent Labs  Lab 11/09/19 1300  WBC 7.6  HGB 13.0  HCT 40.2  MCV 92.2  PLT 433   Basic Metabolic Panel: Recent Labs  Lab 11/09/19 1300  NA 139  K 3.8  CL 106  CO2 24  GLUCOSE 126*  BUN 18  CREATININE 0.58*  CALCIUM 8.6*   GFR: Estimated Creatinine Clearance: 104 mL/min (A) (by C-G formula based on SCr of 0.58 mg/dL (L)). Liver Function Tests: No results for input(s): AST, ALT, ALKPHOS, BILITOT, PROT, ALBUMIN in the last 168 hours. No results for input(s): LIPASE, AMYLASE in the last 168 hours. No results for input(s): AMMONIA in the last 168 hours. Coagulation Profile: Recent Labs  Lab 11/09/19 1637  INR 1.0   Cardiac Enzymes: No results for input(s): CKTOTAL, CKMB, CKMBINDEX, TROPONINI in the last 168 hours. BNP (last 3 results) No results for input(s): PROBNP in the last 8760 hours. HbA1C: No results for  input(s): HGBA1C in the last 72 hours. CBG: No results for input(s): GLUCAP in the last 168 hours. Lipid Profile: No results for input(s): CHOL, HDL, LDLCALC, TRIG, CHOLHDL, LDLDIRECT in the last 72 hours. Thyroid Function Tests: No results for input(s): TSH, T4TOTAL, FREET4, T3FREE, THYROIDAB in the last 72 hours. Anemia Panel: No results for input(s): VITAMINB12, FOLATE, FERRITIN, TIBC, IRON, RETICCTPCT in the last 72 hours. Urine analysis:    Component Value Date/Time   COLORURINE AMBER (A) 11/03/2017 0933   APPEARANCEUR Clear 07/16/2018 1523   LABSPEC 1.018 11/03/2017 0933   LABSPEC 1.034 06/04/2012 1008   PHURINE 5.0 11/03/2017 0933   GLUCOSEU Negative 07/16/2018 1523   GLUCOSEU Negative 06/04/2012 1008   HGBUR LARGE (A) 11/03/2017 0933   BILIRUBINUR Negative 07/16/2018 1523   BILIRUBINUR Negative 06/04/2012 1008   KETONESUR NEGATIVE 11/03/2017 0933   PROTEINUR Negative 07/16/2018 1523   PROTEINUR 30 (A) 11/03/2017 0933   UROBILINOGEN 0.2 09/04/2016 0855   NITRITE Negative 07/16/2018 1523   NITRITE NEGATIVE 11/03/2017 0933   LEUKOCYTESUR Negative 07/16/2018 1523   LEUKOCYTESUR Negative 06/04/2012 1008   Sepsis Labs: @LABRCNTIP (procalcitonin:4,lacticidven:4) ) Recent Results (from the past 240 hour(s))  SARS Coronavirus 2 by RT PCR (hospital order, performed in [place] hospital lab) Nasopharyngeal Nasopharyngeal Swab     Status: None   Collection Time: 11/09/19 12:51 PM   Specimen: Nasopharyngeal Swab  Result Value Ref Range Status   SARS Coronavirus 2 NEGATIVE NEGATIVE Final    Comment: (NOTE) SARS-CoV-2 target nucleic acids are NOT DETECTED. The SARS-CoV-2 RNA is generally detectable in upper and lower respiratory specimens during the acute phase of infection. The lowest concentration of SARS-CoV-2 viral copies this assay can detect is 250 copies / mL. A negative result does not preclude SARS-CoV-2 infection and should not be used as the sole basis for  treatment or other patient management decisions.  A negative result may occur with improper specimen collection / handling, submission of specimen other than nasopharyngeal swab, presence of viral mutation(s) within the areas targeted by this assay, and inadequate number of viral copies (<250 copies / mL). A negative result must be combined with clinical observations, patient history, and epidemiological information. Fact Sheet for Patients:   StrictlyIdeas.no Fact Sheet for Healthcare Providers: BankingDealers.co.za This test  is not yet approved or cleared  by the [place] and has been authorized for detection and/or diagnosis of SARS-CoV-2 by FDA under an Emergency Use Authorization (EUA).  This EUA will remain in effect (meaning this test can be used) for the duration of the COVID-19 declaration under Section 564(b)(1) of the Act, 21 U.S.C. section 360bbb-3(b)(1), unless the authorization is terminated or revoked sooner. Performed at [facility], [address], [place], [place] [address]      Radiological Exams on Admission: DG Chest 2 View  Result Date: 11/09/2019 CLINICAL DATA:  Chest pain. Pain started 45 minutes ago. Pain radiates to back. History of MI with stents. EXAM: CHEST - 2 VIEW COMPARISON:  11/18/2016 FINDINGS: Stable elevation of the RIGHT hemidiaphragm. Heart size is normal. There is minimal subsegmental atelectasis at the lung bases. No consolidations or pulmonary edema. IMPRESSION: Minimal bibasilar atelectasis. Electronically Signed   By: Nolon Nations M.D.   On: 11/09/2019 13:37     EKG: Independently reviewed.  Sinus rhythm, QTC 516, RAD, old right bundle blockade, early R wave progression  Assessment/Plan Principal Problem:   Chest pain Active Problems:   Essential (primary) hypertension   HLD (hyperlipidemia)   CAD (coronary artery disease)   Chronic obstructive pulmonary disease  (HCC)   Elevated troponin   Chronic systolic CHF (congestive heart failure) (HCC)  Chest pain, hx of CAD, elevated troponin: trop 6 -->38. Pt has persistent chest pain since noon, suspecting unstable angina versus non-STEMI. Card, Dr. Ubaldo Glassing is consulted.  - place to progressive unit for observation - IV heparin is ordered by Dr. Ubaldo Glassing - rend Trop - Repeat EKG in the am  - prn Nitroglycerin, Morphine, and aspirin, Crestor - Risk factor stratification: will check FLP and A1C  - check UDS  HTN:  -Continue home medications: Lisinopril -Patient is also on spironolactone -hydralazine prn  HLD (hyperlipidemia) -Crestor  Chronic obstructive pulmonary disease ([place]): stable. -prn albuterol  Chronic systolic CHF: 2D echo on 70/06/7492 showed EF of 40%.  Patient does not have leg edema.  No shortness of breath.  No pulmonary edema chest x-ray.  CHF seems to be compensated -Continue home spironolactone       DVT ppx: on IV Heparin    Code Status: Full code Family Communication:   Yes, patient's wife   at bed side Disposition Plan:  Anticipate discharge back to previous environment Consults called:  Dr. Ubaldo Glassing of card Admission status: progressive unit for obs       Status is: Observation  The patient remains OBS appropriate and will d/c before 2 midnights.  Dispo: The patient is from: Home              Anticipated d/c is to: Home              Anticipated d/c date is: 1 day              Patient currently is not medically stable to d/c.           Date of Service 11/09/2019    Ivor Costa [facility]   If 7PM-7AM, please contact night-coverage [email or web address] 11/09/2019, 5:57 PM

## 2019-11-09 NOTE — Progress Notes (Signed)
ANTICOAGULATION CONSULT NOTE - Initial Consult  Pharmacy Consult for Heparin  Indication: chest pain/ACS  Allergies  Allergen Reactions  . Atorvastatin Other (See Comments)    Muscle aches. Other reaction(s): Other (See Comments) Muscle aches. Muscle pain  . Pregabalin Other (See Comments)    Muscle soreness Other reaction(s): Other (See Comments) Muscle soreness Severe muscle pain    Patient Measurements: Height: 5\' 7"  (170.2 cm) Weight: 117.9 kg (260 lb) IBW/kg (Calculated) : 66.1 Heparin Dosing Weight: 93.2 kg   Vital Signs: Temp: 98.6 F (37 C) (06/06 1248) Temp Source: Oral (06/06 1248) BP: 126/59 (06/06 1530) Pulse Rate: 97 (06/06 1530)  Labs: Recent Labs    11/09/19 1300 11/09/19 1447  HGB 13.0  --   HCT 40.2  --   PLT 214  --   CREATININE 0.58*  --   TROPONINIHS 6 38*    Estimated Creatinine Clearance: 104 mL/min (A) (by C-G formula based on SCr of 0.58 mg/dL (L)).   Medical History: Past Medical History:  Diagnosis Date  . Anemia   . Arthritis   . BCC (basal cell carcinoma of skin)   . CHF (congestive heart failure) ([place])   . Complication of anesthesia    neck hurt for 2-3 days after general anesthesia  . COPD (chronic obstructive pulmonary disease) ([place])   . Coronary artery disease   . Depression   . Glaucoma   . Heart attack ([place])    2013  . History of kidney stones   . HTN (hypertension)   . Hyperglycemia    pt denies  . Hypertension   . Hypokalemia   . Obesity    morbid  . Sleep apnea    osa, CPAP has been dc'd after 249 lb weight loss    Medications:  (Not in a hospital admission)   Assessment: Pharmacy consulted to dose hepar in this 69 year old male admitted with ACS/NSTEMI.  CrCl = 104 ml/min No prior anticoag noted.   Goal of Therapy:  Heparin level 0.3-0.7 units/ml Monitor platelets by anticoagulation protocol: Yes   Plan:  Give 4000 units bolus x 1 Start heparin infusion at 1300 units/hr Check anti-Xa level  in 8 hours and daily while on heparin Continue to monitor H&H and platelets  Auden Tatar D 11/09/2019,4:20 PM

## 2019-11-09 NOTE — ED Provider Notes (Signed)
[facility] Emergency Department Provider Note  Time seen: 1:36 PM  I have reviewed the triage vital signs and the nursing notes.   HISTORY  Chief Complaint Chest Pain   HPI William Armstrong is a 69 y.o. male with a past medical history of anemia, arthritis, CHF, COPD, depression, hypertension, presents to the emergency department for left-sided chest pain.  According to the patient in 2016 he had a heart attack with 2 stents placed.  States today approximately an hour or 2 ago he developed pain in the left side of his chest that appeared to radiate to his back.  At first he thought it could be muscular but then he began getting nauseated and diaphoretic so he came to the emergency department for evaluation.  Here the patient states the pain has diminished somewhat and is mild at this time mostly in the left back.  Denies any current nausea or shortness of breath.  No leg pain or swelling.   Past Medical History:  Diagnosis Date  . Anemia   . Arthritis   . BCC (basal cell carcinoma of skin)   . CHF (congestive heart failure) ([place])   . Complication of anesthesia    neck hurt for 2-3 days after general anesthesia  . COPD (chronic obstructive pulmonary disease) ([place])   . Coronary artery disease   . Depression   . Glaucoma   . Heart attack ([place])    2013  . History of kidney stones   . HTN (hypertension)   . Hyperglycemia    pt denies  . Hypertension   . Hypokalemia   . Obesity    morbid  . Sleep apnea    osa, CPAP has been dc'd after 249 lb weight loss    Patient Active Problem List   Diagnosis Date Noted  . Primary localized osteoarthritis of right hip 01/18/2017  . Hematuria 11/18/2016  . Chronic respiratory failure with hypoxia ([place]) 01/06/2015  . Chronic bronchitis ([place]) 11/30/2014  . COPD, moderate ([place]) 11/30/2014  . H/O: depression 11/30/2014  . Old myocardial infarction 11/30/2014  . HLD (hyperlipidemia) 11/30/2014  . Low back pain 11/30/2014   . Extreme obesity 11/30/2014  . Arthritis of knee, degenerative 11/30/2014  . Back pain, thoracic 11/30/2014  . Dermatophytosis of groin 11/30/2014  . Adiposity 07/08/2014  . Bariatric surgery status 07/08/2014  . Morbid obesity ([place]) 08/21/2013  . Glaucoma 03/14/2012  . Arthritis, degenerative 03/14/2012  . Ventricular fibrillation ([place]) 03/14/2012  . Arteriosclerosis of coronary artery 02/26/2012  . CAFL (chronic airflow limitation) ([place]) 02/26/2012  . BP (high blood pressure) 02/26/2012  . Chronic obstructive pulmonary disease ([place]) 02/26/2012  . Essential (primary) hypertension 07/09/2006  . Open-angle glaucoma 07/09/2006  . Hypoxemia 07/09/2006  . Obstructive apnea 07/09/2006    Past Surgical History:  Procedure Laterality Date  . CORONARY STENT PLACEMENT  02/2012  . CYSTOSCOPY W/ RETROGRADES Left 11/18/2016   Procedure: CYSTOSCOPY WITH RETROGRADE PYELOGRAM;  Surgeon: Irine Seal, MD;  Location: [facility] ORS;  Service: Urology;  Laterality: Left;  . CYSTOSCOPY WITH STENT PLACEMENT Left 11/18/2016   Procedure: CYSTOSCOPY WITH STENT PLACEMENT;  Surgeon: Irine Seal, MD;  Location: [facility] ORS;  Service: Urology;  Laterality: Left;  . CYSTOSCOPY/URETEROSCOPY/HOLMIUM LASER/STENT PLACEMENT Left 11/29/2016   Procedure: CYSTOSCOPY/URETEROSCOPY/HOLMIUM LASER/STENT EXCHANGE;  Surgeon: Hollice Espy, MD;  Location: [facility] ORS;  Service: Urology;  Laterality: Left;  . CYSTOSCOPY/URETEROSCOPY/HOLMIUM LASER/STENT PLACEMENT Right 03/07/2017   Procedure: CYSTOSCOPY/URETEROSCOPY/HOLMIUM LASER/STENT PLACEMENT;  Surgeon: Hollice Espy, MD;  Location:  [facility] ORS;  Service: Urology;  Laterality: Right;  . JOINT REPLACEMENT     right hip  . LAPAROSCOPIC GASTRIC RESTRICTIVE DUODENAL PROCEDURE (DUODENAL SWITCH)    . LAPAROSCOPIC GASTRIC SLEEVE RESECTION  08/21/2013  . skin removal surgery     removal of extra skin approx 20 lbs  . STONE EXTRACTION WITH BASKET Right 03/07/2017   Procedure: STONE EXTRACTION  WITH BASKET;  Surgeon: Hollice Espy, MD;  Location: [facility] ORS;  Service: Urology;  Laterality: Right;  . TONSILLECTOMY AND ADENOIDECTOMY  1960  . TOTAL HIP ARTHROPLASTY Right 01/18/2017   Procedure: TOTAL HIP ARTHROPLASTY ANTERIOR APPROACH;  Surgeon: Hessie Knows, MD;  Location: [facility] ORS;  Service: Orthopedics;  Laterality: Right;    Prior to Admission medications   Medication Sig Start Date End Date Taking? Authorizing Provider  aspirin EC 81 MG tablet Take 81 mg by mouth daily.    [provider]  Cholecalciferol (VITAMIN D) 2000 units CAPS Take 2,000 Units by mouth daily.    [provider]  lisinopril (PRINIVIL,ZESTRIL) 40 MG tablet Take 40 mg by mouth daily.  08/19/16   [provider]  Potassium Citrate 15 MEQ (1620 MG) TBCR Take 1 tablet by mouth 2 (two) times daily. 08/20/19   Hollice Espy, MD  rosuvastatin (CRESTOR) 10 MG tablet Take 10 mg by mouth every other day.     [provider]  sertraline (ZOLOFT) 100 MG tablet Take 1 tablet (100 mg total) by mouth daily. 06/13/19   Trinna Post, PA-C    Allergies  Allergen Reactions  . Atorvastatin Other (See Comments)    Muscle aches.  . Pregabalin Other (See Comments)    Muscle soreness    Family History  Problem Relation Age of Onset  . Heart attack Mother   . Brain cancer Father   . Heart attack Sister   . Congenital heart disease Sister   . Leukemia Paternal Grandmother   . COPD Brother   . Heart disease Brother   . Prostate cancer Neg Hx   . Kidney cancer Neg Hx   . Bladder Cancer Neg Hx     Social History Social History   Tobacco Use  . Smoking status: Never Smoker  . Smokeless tobacco: Never Used  Substance Use Topics  . Alcohol use: No    Alcohol/week: 0.0 standard drinks  . Drug use: No    Review of Systems Constitutional: Negative for fever. Cardiovascular: Negative for chest pain. Respiratory: Negative for shortness of breath. Gastrointestinal: Negative for  abdominal pain, vomiting Musculoskeletal: Negative for musculoskeletal complaints Neurological: Negative for headache All other ROS negative  ____________________________________________   PHYSICAL EXAM:  VITAL SIGNS: ED Triage Vitals [11/09/19 1248]  Enc Vitals Group     BP (!) 153/71     Pulse Rate 94     Resp 18     Temp 98.6 F (37 C)     Temp Source Oral     SpO2 95 %     Weight 260 lb (117.9 kg)     Height 5\' 7"  (1.702 m)     Head Circumference      Peak Flow      Pain Score 6     Pain Loc      Pain Edu?      Excl. in [place]?    Constitutional: Alert and oriented. Well appearing and in no distress. Eyes: Normal exam ENT      Head: Normocephalic and atraumatic.  Mouth/Throat: Mucous membranes are moist. Cardiovascular: Normal rate, regular rhythm.  Respiratory: Normal respiratory effort without tachypnea nor retractions. Breath sounds are clear  Gastrointestinal: Soft and nontender. No distention. Musculoskeletal: Nontender with normal range of motion in all extremities.  Neurologic:  Normal speech and language. No gross focal neurologic deficits  Skin:  Skin is warm, dry and intact.  Psychiatric: Mood and affect are normal.   ____________________________________________    EKG  EKG viewed and interpreted by myself shows a normal sinus rhythm 86 bpm with a narrow QRS, normal axis, normal intervals, no concerning ST changes.  ____________________________________________    RADIOLOGY  Chest x-ray is negative  ____________________________________________   INITIAL IMPRESSION / ASSESSMENT AND PLAN / ED COURSE  Pertinent labs & imaging results that were available during my care of the patient were reviewed by me and considered in my medical decision making (see chart for details).   Patient presents emergency department for chest pain starting approximately 1 hour prior to arrival.  Differential would include ACS, chest wall pain, esophageal or GI  related discomfort, pneumonia, pleurisy.  We will check labs including cardiac enzymes, EKG and chest x-ray.  We will dose 1 nitroglycerin tablet and 324 mg of aspirin.  Chest x-ray is negative.  Chest pain is down to a mild level after the nitroglycerin.  States the nitroglycerin deftly seem to help with the chest pain.  Given the patient's acute onset of chest pain along with nausea and diaphoresis and his high risk status given 2 prior stents I do believe the patient would benefit from admission to the hospital service for further work-up and treatment.  Patient agreeable to plan of care.  William Armstrong was evaluated in Emergency Department on 11/09/2019 for the symptoms described in the history of present illness. He was evaluated in the context of the global COVID-19 pandemic, which necessitated consideration that the patient might be at risk for infection with the SARS-CoV-2 virus that causes COVID-19. Institutional protocols and algorithms that pertain to the evaluation of patients at risk for COVID-19 are in a state of rapid change based on information released by regulatory bodies including the CDC and federal and state organizations. These policies and algorithms were followed during the patient's care in the ED.  ____________________________________________   FINAL CLINICAL IMPRESSION(S) / ED DIAGNOSES  Chest pain   Harvest Dark, MD 11/09/19 1429

## 2019-11-09 NOTE — ED Triage Notes (Addendum)
Pt comes POV with chest pain starting 45 mins ago. Left chest radiating to back. Pt reports sweating. Hx of MI with stents. States that it feels similar.

## 2019-11-09 NOTE — ED Notes (Signed)
Pt transported to the floor by this RN 

## 2019-11-09 NOTE — Consult Note (Signed)
Cardiology Consultation Note    Patient ID: William Armstrong, MRN: [id number], DOB/AGE: [birth year] 69 y.o. Admit date: 11/09/2019   Date of Consult: 11/09/2019 Primary Physician: [name], PA Primary Cardiologist: Dr. Collins Scotland, [facility], [facility]  Chief Complaint: chest pain Reason for Consultation: chest pain/abnormal troponin Requesting MD: Dr. Blaine Hamper  HPI: William Armstrong is a 69 y.o. male with history of coronary artery disease followed at [facility], history of PCI of the RCA in 2013, negative functional study other than prior inferior infarction by Myoview in 2016 with no reversible ischemia, history of COPD, hyperlipidemia, obesity status post bariatric surgery with loss of over 200 pounds, history of chronic systolic heart failure with a known ejection fraction of 47% by functional study in 2016.  He presented to the emergency room with several hours of midsternal and back pain.  EKG on presentation revealed his usual sinus rhythm with right bundle branch block and left posterior fascicular block.  There were no changes from baseline.  He was given sublingual nitroglycerin with  improvement.  He last saw his cardiologist in October 2020 and was doing well at that time on spironolactone 25 mg daily, aspirin 81 mg daily, lisinopril 40 mg daily, potassium citrate 15 mEq daily, rosuvastatin 10 mg daily, sertraline 100 mg daily.  He has been pain-free since admission.  He ruled in for non-ST elevation myocardial infarction.  Peak high-sensitivity troponin is 4154, HDL is 29, LDL is 33.  Past Medical History:  Diagnosis Date  . Anemia   . Arthritis   . BCC (basal cell carcinoma of skin)   . CHF (congestive heart failure) ([place])   . Complication of anesthesia    neck hurt for 2-3 days after general anesthesia  . COPD (chronic obstructive pulmonary disease) ([place])   . Coronary artery disease   . Depression   . Glaucoma   . Heart attack ([place])    2013  . History of kidney  stones   . HTN (hypertension)   . Hyperglycemia    pt denies  . Hypertension   . Hypokalemia   . Obesity    morbid  . Sleep apnea    osa, CPAP has been dc'd after 249 lb weight loss      Surgical History:  Past Surgical History:  Procedure Laterality Date  . CORONARY STENT PLACEMENT  02/2012  . CYSTOSCOPY W/ RETROGRADES Left 11/18/2016   Procedure: CYSTOSCOPY WITH RETROGRADE PYELOGRAM;  Surgeon: Irine Seal, MD;  Location: [facility] ORS;  Service: Urology;  Laterality: Left;  . CYSTOSCOPY WITH STENT PLACEMENT Left 11/18/2016   Procedure: CYSTOSCOPY WITH STENT PLACEMENT;  Surgeon: Irine Seal, MD;  Location: [facility] ORS;  Service: Urology;  Laterality: Left;  . CYSTOSCOPY/URETEROSCOPY/HOLMIUM LASER/STENT PLACEMENT Left 11/29/2016   Procedure: CYSTOSCOPY/URETEROSCOPY/HOLMIUM LASER/STENT EXCHANGE;  Surgeon: Hollice Espy, MD;  Location: [facility] ORS;  Service: Urology;  Laterality: Left;  . CYSTOSCOPY/URETEROSCOPY/HOLMIUM LASER/STENT PLACEMENT Right 03/07/2017   Procedure: CYSTOSCOPY/URETEROSCOPY/HOLMIUM LASER/STENT PLACEMENT;  Surgeon: Hollice Espy, MD;  Location: [facility] ORS;  Service: Urology;  Laterality: Right;  . JOINT REPLACEMENT     right hip  . LAPAROSCOPIC GASTRIC RESTRICTIVE DUODENAL PROCEDURE (DUODENAL SWITCH)    . LAPAROSCOPIC GASTRIC SLEEVE RESECTION  08/21/2013  . skin removal surgery     removal of extra skin approx 20 lbs  . STONE EXTRACTION WITH BASKET Right 03/07/2017   Procedure: STONE EXTRACTION WITH BASKET;  Surgeon: Hollice Espy, MD;  Location: [facility] ORS;  Service: Urology;  Laterality: Right;  .  TONSILLECTOMY AND ADENOIDECTOMY  1960  . TOTAL HIP ARTHROPLASTY Right 01/18/2017   Procedure: TOTAL HIP ARTHROPLASTY ANTERIOR APPROACH;  Surgeon: Hessie Knows, MD;  Location: [facility] ORS;  Service: Orthopedics;  Laterality: Right;     Home Meds: Prior to Admission medications   Medication Sig Start Date End Date Taking? Authorizing Provider  aspirin EC 81 MG tablet Take 81 mg by  mouth daily.   Yes [provider]  Cholecalciferol (VITAMIN D) 2000 units CAPS Take 2,000 Units by mouth daily.   Yes [provider]  latanoprost (XALATAN) 0.005 % ophthalmic solution Place 1 drop into both eyes at bedtime. 08/05/19  Yes [provider]  lisinopril (PRINIVIL,ZESTRIL) 40 MG tablet Take 40 mg by mouth daily.  08/19/16  Yes [provider]  Potassium Citrate 15 MEQ (1620 MG) TBCR Take 1 tablet by mouth 2 (two) times daily. 08/20/19  Yes Hollice Espy, MD  rosuvastatin (CRESTOR) 10 MG tablet Take 10 mg by mouth every other day.    Yes [provider]  sertraline (ZOLOFT) 100 MG tablet Take 1 tablet (100 mg total) by mouth daily. 06/13/19  Yes Trinna Post, PA-C  spironolactone (ALDACTONE) 25 MG tablet Take 25 mg by mouth daily. 10/16/19  Yes [provider]    Inpatient Medications:  . [START ON 11/10/2019] aspirin  324 mg Oral Daily   . sodium chloride      Allergies:  Allergies  Allergen Reactions  . Atorvastatin Other (See Comments)    Muscle aches. Other reaction(s): Other (See Comments) Muscle aches. Muscle pain  . Pregabalin Other (See Comments)    Muscle soreness Other reaction(s): Other (See Comments) Muscle soreness Severe muscle pain    Social History   Socioeconomic History  . Marital status: Married    Spouse name: Not on file  . Number of children: Not on file  . Years of education: Not on file  . Highest education level: Not on file  Occupational History  . Not on file  Tobacco Use  . Smoking status: Never Smoker  . Smokeless tobacco: Never Used  Substance and Sexual Activity  . Alcohol use: No    Alcohol/week: 0.0 standard drinks  . Drug use: No  . Sexual activity: Not on file  Other Topics Concern  . Not on file  Social History Narrative  . Not on file   Social Determinants of Health   Financial Resource Strain:   . Difficulty of Paying Living Expenses:   Food Insecurity:   .  Worried About Charity fundraiser in the Last Year:   . Arboriculturist in the Last Year:   Transportation Needs:   . Film/video editor (Medical):   Marland Kitchen Lack of Transportation (Non-Medical):   Physical Activity:   . Days of Exercise per Week:   . Minutes of Exercise per Session:   Stress:   . Feeling of Stress :   Social Connections:   . Frequency of Communication with Friends and Family:   . Frequency of Social Gatherings with Friends and Family:   . Attends Religious Services:   . Active Member of Clubs or Organizations:   . Attends Archivist Meetings:   Marland Kitchen Marital Status:   Intimate Partner Violence:   . Fear of Current or Ex-Partner:   . Emotionally Abused:   Marland Kitchen Physically Abused:   . Sexually Abused:      Family History  Problem Relation Age of Onset  . Heart  attack Mother   . Brain cancer Father   . Heart attack Sister   . Congenital heart disease Sister   . Leukemia Paternal Grandmother   . COPD Brother   . Heart disease Brother   . Prostate cancer Neg Hx   . Kidney cancer Neg Hx   . Bladder Cancer Neg Hx      Review of Systems: A 12-system review of systems was performed and is negative except as noted in the HPI.  Labs: No results for input(s): CKTOTAL, CKMB, TROPONINI in the last 72 hours. Lab Results  Component Value Date   WBC 7.6 11/09/2019   HGB 13.0 11/09/2019   HCT 40.2 11/09/2019   MCV 92.2 11/09/2019   PLT 214 11/09/2019    Recent Labs  Lab 11/09/19 1300  NA 139  K 3.8  CL 106  CO2 24  BUN 18  CREATININE 0.58*  CALCIUM 8.6*  GLUCOSE 126*   No results found for: CHOL, HDL, LDLCALC, TRIG No results found for: DDIMER  Radiology/Studies:  DG Chest 2 View  Result Date: 11/09/2019 CLINICAL DATA:  Chest pain. Pain started 45 minutes ago. Pain radiates to back. History of MI with stents. EXAM: CHEST - 2 VIEW COMPARISON:  11/18/2016 FINDINGS: Stable elevation of the RIGHT hemidiaphragm. Heart size is normal. There is minimal  subsegmental atelectasis at the lung bases. No consolidations or pulmonary edema. IMPRESSION: Minimal bibasilar atelectasis. Electronically Signed   By: Nolon Nations M.D.   On: 11/09/2019 13:37    Wt Readings from Last 3 Encounters:  11/09/19 117.9 kg  07/23/19 120.2 kg  07/16/18 113.2 kg    EKG: Sinus rhythm with right bundle branch block, left posterior fascicular block.  No appreciable change from baseline.  Physical Exam: Caucasian male in no acute distress Blood pressure (!) 172/84, pulse 81, temperature 98.6 F (37 C), temperature source Oral, resp. rate (!) 21, height 5\' 7"  (1.702 m), weight 117.9 kg, SpO2 100 %. Body mass index is 40.72 kg/m. General: Well developed, well nourished, in no acute distress. Head: Normocephalic, atraumatic, sclera non-icteric, no xanthomas, nares are without discharge.  Neck: Negative for carotid bruits. JVD not elevated. Lungs: Clear bilaterally to auscultation without wheezes, rales, or rhonchi. Breathing is unlabored. Heart: RRR with S1 S2. No murmurs, rubs, or gallops appreciated. Abdomen: Soft, non-tender, non-distended with normoactive bowel sounds. No hepatomegaly. No rebound/guarding. No obvious abdominal masses. Msk:  Strength and tone appear normal for age. Extremities: No clubbing or cyanosis. No edema.  Distal pedal pulses are 2+ and equal bilaterally. Neuro: Alert and oriented X 3. No facial asymmetry. No focal deficit. Moves all extremities spontaneously. Psych:  Responds to questions appropriately with a normal affect.     Assessment and Plan  69 y.o. male with history of coronary artery disease followed at [facility], history of PCI of the RCA in 2013, negative functional study other than prior inferior infarction by Myoview in 2016 with no reversible ischemia, history of COPD, hyperlipidemia, obesity status post bariatric surgery with loss of over 200 pounds, history of chronic systolic heart failure with a known  ejection fraction of 47% by functional study in 2016.  He presented to the emergency room with several hours of midsternal and back pain.  EKG on presentation revealed his usual sinus rhythm with right bundle branch block and left posterior fascicular block.  There were no changes from baseline.  He was given sublingual nitroglycerin with  improvement.  1.  Non-ST elevation myocardial  infarction-ruled in for non-ST elevation myocardial infarction.  Hemodynamically stable and pain-free on aspirin, metoprolol, heparin, statin.  Risk and benefits of left heart catheter explained to the patient.  Plan to proceed to left heart cath to evaluate coronary anatomy.  Status post PCI of the RCA in 2016.  Further recommendations after cath.  2.  LDL goal of less than 70.  Continue with rosuvastatin at 10 mg daily.  3.  Hypertension-continue with current regimen and follow.  Continue with lisinopril 40.  Metoprolol tartrate 25 twice daily.  Signed, Teodoro Spray MD 11/09/2019, 3:48 PM Pager: [phone]

## 2019-11-10 ENCOUNTER — Encounter
Admission: EM | Disposition: A | Discharge status: Home / Self Care | Payer: Self-pay | Source: Home / Self Care | Attending: Internal Medicine

## 2019-11-10 DIAGNOSIS — Z7982 Long term (current) use of aspirin: Secondary | ICD-10-CM | POA: Diagnosis not present

## 2019-11-10 DIAGNOSIS — H409 Unspecified glaucoma: Secondary | ICD-10-CM | POA: Diagnosis present

## 2019-11-10 DIAGNOSIS — Z9884 Bariatric surgery status: Secondary | ICD-10-CM | POA: Diagnosis not present

## 2019-11-10 DIAGNOSIS — Z955 Presence of coronary angioplasty implant and graft: Secondary | ICD-10-CM | POA: Diagnosis not present

## 2019-11-10 DIAGNOSIS — F329 Major depressive disorder, single episode, unspecified: Secondary | ICD-10-CM | POA: Diagnosis present

## 2019-11-10 DIAGNOSIS — Z79899 Other long term (current) drug therapy: Secondary | ICD-10-CM | POA: Diagnosis not present

## 2019-11-10 DIAGNOSIS — I110 Hypertensive heart disease with heart failure: Secondary | ICD-10-CM | POA: Diagnosis present

## 2019-11-10 DIAGNOSIS — I10 Essential (primary) hypertension: Secondary | ICD-10-CM | POA: Diagnosis not present

## 2019-11-10 DIAGNOSIS — T82855A Stenosis of coronary artery stent, initial encounter: Secondary | ICD-10-CM | POA: Diagnosis present

## 2019-11-10 DIAGNOSIS — R001 Bradycardia, unspecified: Secondary | ICD-10-CM | POA: Diagnosis present

## 2019-11-10 DIAGNOSIS — I214 Non-ST elevation (NSTEMI) myocardial infarction: Secondary | ICD-10-CM | POA: Diagnosis present

## 2019-11-10 DIAGNOSIS — I2510 Atherosclerotic heart disease of native coronary artery without angina pectoris: Secondary | ICD-10-CM | POA: Diagnosis present

## 2019-11-10 DIAGNOSIS — I200 Unstable angina: Secondary | ICD-10-CM | POA: Diagnosis not present

## 2019-11-10 DIAGNOSIS — Y831 Surgical operation with implant of artificial internal device as the cause of abnormal reaction of the patient, or of later complication, without mention of misadventure at the time of the procedure: Secondary | ICD-10-CM | POA: Diagnosis present

## 2019-11-10 DIAGNOSIS — R079 Chest pain, unspecified: Secondary | ICD-10-CM | POA: Diagnosis present

## 2019-11-10 DIAGNOSIS — J449 Chronic obstructive pulmonary disease, unspecified: Secondary | ICD-10-CM | POA: Diagnosis present

## 2019-11-10 DIAGNOSIS — I252 Old myocardial infarction: Secondary | ICD-10-CM | POA: Diagnosis not present

## 2019-11-10 DIAGNOSIS — Z20822 Contact with and (suspected) exposure to covid-19: Secondary | ICD-10-CM | POA: Diagnosis present

## 2019-11-10 DIAGNOSIS — D649 Anemia, unspecified: Secondary | ICD-10-CM | POA: Diagnosis present

## 2019-11-10 DIAGNOSIS — Z85828 Personal history of other malignant neoplasm of skin: Secondary | ICD-10-CM | POA: Diagnosis not present

## 2019-11-10 DIAGNOSIS — G4733 Obstructive sleep apnea (adult) (pediatric): Secondary | ICD-10-CM | POA: Diagnosis present

## 2019-11-10 DIAGNOSIS — E785 Hyperlipidemia, unspecified: Secondary | ICD-10-CM | POA: Diagnosis present

## 2019-11-10 DIAGNOSIS — Z96641 Presence of right artificial hip joint: Secondary | ICD-10-CM | POA: Diagnosis present

## 2019-11-10 DIAGNOSIS — I5022 Chronic systolic (congestive) heart failure: Secondary | ICD-10-CM | POA: Diagnosis present

## 2019-11-10 DIAGNOSIS — I25110 Atherosclerotic heart disease of native coronary artery with unstable angina pectoris: Secondary | ICD-10-CM | POA: Diagnosis not present

## 2019-11-10 DIAGNOSIS — Z6841 Body Mass Index (BMI) 40.0 and over, adult: Secondary | ICD-10-CM | POA: Diagnosis not present

## 2019-11-10 DIAGNOSIS — Z888 Allergy status to other drugs, medicaments and biological substances status: Secondary | ICD-10-CM | POA: Diagnosis not present

## 2019-11-10 DIAGNOSIS — I452 Bifascicular block: Secondary | ICD-10-CM | POA: Diagnosis present

## 2019-11-10 HISTORY — PX: CORONARY STENT INTERVENTION: CATH118234

## 2019-11-10 HISTORY — PX: LEFT HEART CATH AND CORONARY ANGIOGRAPHY: CATH118249

## 2019-11-10 LAB — LIPID PANEL
Cholesterol: 70 mg/dL (ref 0–200)
HDL: 29 mg/dL — ABNORMAL LOW (ref >40)
LDL Cholesterol: 33 mg/dL (ref 0–99)
Total CHOL/HDL Ratio: 2.4 RATIO
Triglycerides: 40 mg/dL (ref <150)
VLDL: 8 mg/dL (ref 0–40)

## 2019-11-10 LAB — BASIC METABOLIC PANEL
Anion gap: 6 (ref 5–15)
BUN: 15 mg/dL (ref 8–23)
CO2: 25 mmol/L (ref 22–32)
Calcium: 8.2 mg/dL — ABNORMAL LOW (ref 8.9–10.3)
Chloride: 109 mmol/L (ref 98–111)
Creatinine, Ser: 0.43 mg/dL — ABNORMAL LOW (ref 0.61–1.24)
GFR calc Af Amer: >60 mL/min (ref >60)
GFR calc non Af Amer: >60 mL/min (ref >60)
Glucose, Bld: 105 mg/dL — ABNORMAL HIGH (ref 70–99)
Potassium: 3.8 mmol/L (ref 3.5–5.1)
Sodium: 140 mmol/L (ref 135–145)

## 2019-11-10 LAB — CBC
HCT: 34.3 % — ABNORMAL LOW (ref 39.0–52.0)
Hemoglobin: 11.2 g/dL — ABNORMAL LOW (ref 13.0–17.0)
MCH: 29.9 pg (ref 26.0–34.0)
MCHC: 32.7 g/dL (ref 30.0–36.0)
MCV: 91.7 fL (ref 80.0–100.0)
Platelets: 187 10*3/uL (ref 150–400)
RBC: 3.74 MIL/uL — ABNORMAL LOW (ref 4.22–5.81)
RDW: 13.5 % (ref 11.5–15.5)
WBC: 8 10*3/uL (ref 4.0–10.5)
nRBC: 0 % (ref 0.0–0.2)

## 2019-11-10 LAB — HEMOGLOBIN A1C
Hgb A1c MFr Bld: 5.1 % (ref 4.8–5.6)
Mean Plasma Glucose: 99.67 mg/dL

## 2019-11-10 LAB — TROPONIN I (HIGH SENSITIVITY): Troponin I (High Sensitivity): 4154 ng/L (ref <18)

## 2019-11-10 LAB — POCT ACTIVATED CLOTTING TIME: Activated Clotting Time: 351 seconds

## 2019-11-10 LAB — HEPARIN LEVEL (UNFRACTIONATED): Heparin Unfractionated: 0.3 IU/mL (ref 0.30–0.70)

## 2019-11-10 SURGERY — LEFT HEART CATH AND CORONARY ANGIOGRAPHY
Anesthesia: Moderate Sedation

## 2019-11-10 MED ORDER — MIDAZOLAM HCL 2 MG/2ML IJ SOLN
INTRAMUSCULAR | Status: AC
  Filled 2019-11-10: qty 2

## 2019-11-10 MED ORDER — ASPIRIN 81 MG PO CHEW
81.0000 mg | CHEWABLE_TABLET | Freq: Every day | ORAL | Status: DC
  Administered 2019-11-11: 81 mg via ORAL
  Filled 2019-11-10: qty 1

## 2019-11-10 MED ORDER — ASPIRIN 81 MG PO CHEW
CHEWABLE_TABLET | ORAL | Status: DC | PRN
  Administered 2019-11-10: 243 mg via ORAL

## 2019-11-10 MED ORDER — ASPIRIN 81 MG PO CHEW
CHEWABLE_TABLET | ORAL | Status: AC
  Filled 2019-11-10: qty 1

## 2019-11-10 MED ORDER — SODIUM CHLORIDE 0.9 % IV SOLN: 250.0000 mL | INTRAVENOUS | Status: DC | PRN

## 2019-11-10 MED ORDER — MIDAZOLAM HCL 2 MG/2ML IJ SOLN
INTRAMUSCULAR | Status: DC | PRN
  Administered 2019-11-10: 1 mg via INTRAVENOUS

## 2019-11-10 MED ORDER — ASPIRIN EC 81 MG PO TBEC: 81.0000 mg | DELAYED_RELEASE_TABLET | Freq: Every day | ORAL | Status: DC

## 2019-11-10 MED ORDER — FENTANYL CITRATE (PF) 100 MCG/2ML IJ SOLN
INTRAMUSCULAR | Status: DC | PRN
  Administered 2019-11-10: 25 ug via INTRAVENOUS

## 2019-11-10 MED ORDER — FENTANYL CITRATE (PF) 100 MCG/2ML IJ SOLN
INTRAMUSCULAR | Status: AC
  Filled 2019-11-10: qty 2

## 2019-11-10 MED ORDER — ASPIRIN 81 MG PO CHEW: 81.0000 mg | CHEWABLE_TABLET | ORAL | Status: DC

## 2019-11-10 MED ORDER — SODIUM CHLORIDE 0.9% FLUSH
3.0000 mL | Freq: Two times a day (BID) | INTRAVENOUS | Status: DC
  Administered 2019-11-10: 3 mL via INTRAVENOUS

## 2019-11-10 MED ORDER — SODIUM CHLORIDE 0.9% FLUSH: 3.0000 mL | INTRAVENOUS | Status: DC | PRN

## 2019-11-10 MED ORDER — CLOPIDOGREL BISULFATE 75 MG PO TABS
ORAL_TABLET | ORAL | Status: AC
  Filled 2019-11-10: qty 8

## 2019-11-10 MED ORDER — SODIUM CHLORIDE 0.9 % WEIGHT BASED INFUSION: 1.0000 mL/kg/h | INTRAVENOUS | Status: DC

## 2019-11-10 MED ORDER — IOHEXOL 300 MG/ML  SOLN
INTRAMUSCULAR | Status: DC | PRN
  Administered 2019-11-10: 85 mL

## 2019-11-10 MED ORDER — METOPROLOL TARTRATE 25 MG PO TABS
12.5000 mg | ORAL_TABLET | Freq: Two times a day (BID) | ORAL | Status: DC
  Filled 2019-11-10: qty 1

## 2019-11-10 MED ORDER — SODIUM CHLORIDE 0.9 % IV SOLN
INTRAVENOUS | Status: AC | PRN
  Administered 2019-11-10 (×2): 1.75 mg/kg/h via INTRAVENOUS

## 2019-11-10 MED ORDER — HEPARIN (PORCINE) IN NACL 1000-0.9 UT/500ML-% IV SOLN
INTRAVENOUS | Status: DC | PRN
  Administered 2019-11-10: 500 mL

## 2019-11-10 MED ORDER — ACETAMINOPHEN 325 MG PO TABS: 650.0000 mg | ORAL_TABLET | ORAL | Status: DC | PRN

## 2019-11-10 MED ORDER — CLOPIDOGREL BISULFATE 75 MG PO TABS
ORAL_TABLET | ORAL | Status: DC | PRN
  Administered 2019-11-10: 600 mg via ORAL

## 2019-11-10 MED ORDER — BIVALIRUDIN BOLUS VIA INFUSION - CUPID
INTRAVENOUS | Status: DC | PRN
  Administered 2019-11-10: 88.5 mg via INTRAVENOUS

## 2019-11-10 MED ORDER — LABETALOL HCL 5 MG/ML IV SOLN: 10.0000 mg | INTRAVENOUS | Status: AC | PRN

## 2019-11-10 MED ORDER — SODIUM CHLORIDE 0.9 % WEIGHT BASED INFUSION
1.0000 mL/kg/h | INTRAVENOUS | Status: DC
  Administered 2019-11-10: 1 mL/kg/h via INTRAVENOUS

## 2019-11-10 MED ORDER — IOHEXOL 300 MG/ML  SOLN
INTRAMUSCULAR | Status: DC | PRN
  Administered 2019-11-10: 175 mL via INTRA_ARTERIAL

## 2019-11-10 MED ORDER — BIVALIRUDIN TRIFLUOROACETATE 250 MG IV SOLR
INTRAVENOUS | Status: AC
  Filled 2019-11-10: qty 250

## 2019-11-10 MED ORDER — ASPIRIN 81 MG PO CHEW
CHEWABLE_TABLET | ORAL | Status: AC
  Filled 2019-11-10: qty 3

## 2019-11-10 MED ORDER — ONDANSETRON HCL 4 MG/2ML IJ SOLN: 4.0000 mg | Freq: Four times a day (QID) | INTRAMUSCULAR | Status: DC | PRN

## 2019-11-10 MED ORDER — SODIUM CHLORIDE 0.9 % WEIGHT BASED INFUSION: 3.0000 mL/kg/h | INTRAVENOUS | Status: DC

## 2019-11-10 MED ORDER — METOPROLOL TARTRATE 25 MG PO TABS: 25.0000 mg | ORAL_TABLET | Freq: Two times a day (BID) | ORAL | Status: DC

## 2019-11-10 MED ORDER — HEPARIN (PORCINE) IN NACL 1000-0.9 UT/500ML-% IV SOLN
INTRAVENOUS | Status: AC
  Filled 2019-11-10: qty 1000

## 2019-11-10 MED ORDER — CLOPIDOGREL BISULFATE 75 MG PO TABS
75.0000 mg | ORAL_TABLET | Freq: Every day | ORAL | Status: DC
  Administered 2019-11-11: 75 mg via ORAL
  Filled 2019-11-10: qty 1

## 2019-11-10 MED ORDER — ASPIRIN EC 81 MG PO TBEC
81.0000 mg | DELAYED_RELEASE_TABLET | Freq: Every day | ORAL | Status: DC
  Administered 2019-11-10: 81 mg via ORAL

## 2019-11-10 MED ORDER — ENOXAPARIN SODIUM 40 MG/0.4ML ~~LOC~~ SOLN
40.0000 mg | Freq: Two times a day (BID) | SUBCUTANEOUS | Status: DC
  Administered 2019-11-11: 40 mg via SUBCUTANEOUS
  Filled 2019-11-10 (×2): qty 0.4

## 2019-11-10 MED ORDER — HYDRALAZINE HCL 20 MG/ML IJ SOLN: 10.0000 mg | INTRAMUSCULAR | Status: AC | PRN

## 2019-11-10 SURGICAL SUPPLY — 18 items
BALLN TREK RX 3.0X15 (BALLOONS) ×3
BALLOON TREK RX 3.0X15 (BALLOONS) IMPLANT
CATH INFINITI 5FR ANG PIGTAIL (CATHETERS) ×2 IMPLANT
CATH INFINITI 5FR JL4 (CATHETERS) ×2 IMPLANT
CATH INFINITI JR4 5F (CATHETERS) ×2 IMPLANT
CATH VISTA GUIDE 6FR JR4 SH (CATHETERS) ×2 IMPLANT
DEVICE CLOSURE MYNXGRIP 6/7F (Vascular Products) ×2 IMPLANT
DEVICE INFLAT 30 PLUS (MISCELLANEOUS) ×2 IMPLANT
KIT MANI 3VAL PERCEP (MISCELLANEOUS) ×3 IMPLANT
NDL PERC 18GX7CM (NEEDLE) IMPLANT
NEEDLE PERC 18GX7CM (NEEDLE) ×3 IMPLANT
PACK CARDIAC CATH (CUSTOM PROCEDURE TRAY) ×2 IMPLANT
SHEATH AVANTI 5FR X 11CM (SHEATH) ×2 IMPLANT
SHEATH AVANTI 6FR X 11CM (SHEATH) ×2 IMPLANT
STENT RESOLUTE ONYX 3.5X22 (Permanent Stent) ×2 IMPLANT
WIRE ASAHI PROWATER 180CM (WIRE) ×2 IMPLANT
WIRE G HI TQ BMW 190 (WIRE) ×2 IMPLANT
WIRE GUIDERIGHT .035X150 (WIRE) ×2 IMPLANT

## 2019-11-10 NOTE — Progress Notes (Signed)
Pt. Was in Atrial fib. Upon coming out of PCI. Currently in SB with BBB, with PAC's and PVC'S (occ.), Paged Dr. Ubaldo Glassing to make aware.

## 2019-11-10 NOTE — Progress Notes (Signed)
ANTICOAGULATION CONSULT NOTE - Initial Consult  Pharmacy Consult for Heparin  Indication: chest pain/ACS  Allergies  Allergen Reactions  . Atorvastatin Other (See Comments)    Muscle aches. Other reaction(s): Other (See Comments) Muscle aches. Muscle pain  . Pregabalin Other (See Comments)    Muscle soreness Other reaction(s): Other (See Comments) Muscle soreness Severe muscle pain    Patient Measurements: Height: 5\' 7"  (170.2 cm) Weight: 118.1 kg (260 lb 4.8 oz) IBW/kg (Calculated) : 66.1 Heparin Dosing Weight: 93.2 kg   Vital Signs: Temp: 98.3 F (36.8 C) (06/06 1934) Temp Source: Oral (06/06 1934) BP: 174/59 (06/06 1934) Pulse Rate: 74 (06/06 1934)  Labs: Recent Labs    11/09/19 1300 11/09/19 1447 11/09/19 1637 11/09/19 1945 11/09/19 2230 11/10/19 0038  HGB 13.0  --   --   --   --  11.2*  HCT 40.2  --   --   --   --  34.3*  PLT 214  --   --   --   --  187  APTT  --   --  29  --   --   --   LABPROT  --   --  12.9  --   --   --   INR  --   --  1.0  --   --   --   HEPARINUNFRC  --   --   --   --   --  0.30  CREATININE 0.58*  --   --   --   --  0.43*  TROPONINIHS 6   < >  --  1,514* 2,938* 4,154*   < > = values in this interval not displayed.    Estimated Creatinine Clearance: 104.1 mL/min (A) (by C-G formula based on SCr of 0.43 mg/dL (L)).   Medical History: Past Medical History:  Diagnosis Date  . Anemia   . Arthritis   . BCC (basal cell carcinoma of skin)   . CHF (congestive heart failure) ([place])   . Complication of anesthesia    neck hurt for 2-3 days after general anesthesia  . COPD (chronic obstructive pulmonary disease) ([place])   . Coronary artery disease   . Depression   . Glaucoma   . Heart attack ([place])    2013  . History of kidney stones   . HTN (hypertension)   . Hyperglycemia    pt denies  . Hypertension   . Hypokalemia   . Obesity    morbid  . Sleep apnea    osa, CPAP has been dc'd after 249 lb weight loss    Medications:   Medications Prior to Admission  Medication Sig Dispense Refill Last Dose  . aspirin EC 81 MG tablet Take 81 mg by mouth daily.   11/09/2019 at 1030  . Cholecalciferol (VITAMIN D) 2000 units CAPS Take 2,000 Units by mouth daily.   11/09/2019 at 1030  . latanoprost (XALATAN) 0.005 % ophthalmic solution Place 1 drop into both eyes at bedtime.   11/08/2019 at 2030  . lisinopril (PRINIVIL,ZESTRIL) 40 MG tablet Take 40 mg by mouth daily.    11/09/2019 at 1030  . Potassium Citrate 15 MEQ (1620 MG) TBCR Take 1 tablet by mouth 2 (two) times daily. 60 tablet 11 11/09/2019 at 1030  . rosuvastatin (CRESTOR) 10 MG tablet Take 10 mg by mouth every other day.    11/09/2019 at 1030  . sertraline (ZOLOFT) 100 MG tablet Take 1 tablet (100 mg total) by mouth daily.  90 tablet 3 11/09/2019 at 1030  . spironolactone (ALDACTONE) 25 MG tablet Take 25 mg by mouth daily.   11/09/2019 at 1030    Assessment: Pharmacy consulted to dose hepar in this 69 year old male admitted with ACS/NSTEMI.  CrCl = 104 ml/min No prior anticoag noted.   Goal of Therapy:  Heparin level 0.3-0.7 units/ml Monitor platelets by anticoagulation protocol: Yes   Plan:  06/07 @ 0030 HL 0.30 therapeutic. Will continue current rate and will recheck HL at 0800, CBC trended down will continue to monitor.  Tobie Lords, PharmD, BCPS Clinical Pharmacist 11/10/2019,3:21 AM

## 2019-11-10 NOTE — Progress Notes (Signed)
Dr. Ubaldo Glassing at bedside to assess pt. Current rhythm. Currently alternating btwn. NSR and SB. MD shown 12 leads of AF and SB. No current changes to meds. No new orders. Will continue to monitor and assess.

## 2019-11-10 NOTE — Progress Notes (Signed)
Pt had 6 beat run of vtach while I was in room, patient asymptomatic, VSS, no chest pain. Dr. Ubaldo Glassing aware. No new orders received. Will continue to monitor.

## 2019-11-10 NOTE — Progress Notes (Signed)
[facility] [place] NAME: William Armstrong    MR#:  [id number]  DATE OF BIRTH:  1951-05-28  SUBJECTIVE:  CHIEF COMPLAINT:   Chief Complaint  Patient presents with  . Chest Pain  no further chest pain, s/p cath. REVIEW OF SYSTEMS:  Review of Systems  Constitutional: Negative for diaphoresis, fever, malaise/fatigue and weight loss.  HENT: Negative for ear discharge, ear pain, hearing loss, nosebleeds, sore throat and tinnitus.   Eyes: Negative for blurred vision and pain.  Respiratory: Negative for cough, hemoptysis, shortness of breath and wheezing.   Cardiovascular: Positive for chest pain. Negative for palpitations, orthopnea and leg swelling.  Gastrointestinal: Negative for abdominal pain, blood in stool, constipation, diarrhea, heartburn, nausea and vomiting.  Genitourinary: Negative for dysuria, frequency and urgency.  Musculoskeletal: Negative for back pain and myalgias.  Skin: Negative for itching and rash.  Neurological: Negative for dizziness, tingling, tremors, focal weakness, seizures, weakness and headaches.  Psychiatric/Behavioral: Negative for depression. The patient is not nervous/anxious.    DRUG ALLERGIES:   Allergies  Allergen Reactions  . Atorvastatin Other (See Comments)    Muscle aches. Other reaction(s): Other (See Comments) Muscle aches. Muscle pain  . Pregabalin Other (See Comments)    Muscle soreness Other reaction(s): Other (See Comments) Muscle soreness Severe muscle pain   VITALS:  Blood pressure (!) 120/54, pulse (!) 45, temperature 98.2 F (36.8 C), temperature source Oral, resp. rate 18, height 5\' 7"  (1.702 m), weight 118 kg, SpO2 96 %. PHYSICAL EXAMINATION:  Physical Exam HENT:     Head: Normocephalic and atraumatic.  Eyes:     Conjunctiva/sclera: Conjunctivae normal.     Pupils: Pupils are equal, round, and reactive to light.  Neck:     Thyroid: No thyromegaly.     Trachea: No tracheal deviation.   Cardiovascular:     Rate and Rhythm: Normal rate and regular rhythm.     Heart sounds: Normal heart sounds.  Pulmonary:     Effort: Pulmonary effort is normal. No respiratory distress.     Breath sounds: Normal breath sounds. No wheezing.  Chest:     Chest wall: No tenderness.  Abdominal:     General: Bowel sounds are normal. There is no distension.     Palpations: Abdomen is soft.     Tenderness: There is no abdominal tenderness.  Musculoskeletal:        General: Normal range of motion.     Cervical back: Normal range of motion and neck supple.  Skin:    General: Skin is warm and dry.     Findings: No rash.  Neurological:     Mental Status: He is alert and oriented to person, place, and time.     Cranial Nerves: No cranial nerve deficit.    LABORATORY PANEL:  Male CBC Recent Labs  Lab 11/10/19 0038  WBC 8.0  HGB 11.2*  HCT 34.3*  PLT 187   ------------------------------------------------------------------------------------------------------------------ Chemistries  Recent Labs  Lab 11/10/19 0038  NA 140  K 3.8  CL 109  CO2 25  GLUCOSE 105*  BUN 15  CREATININE 0.43*  CALCIUM 8.2*   RADIOLOGY:  CARDIAC CATHETERIZATION  Result Date: 11/10/2019  Prox RCA-2 lesion is 15% stenosed.  Mid RCA lesion is 5% stenosed.  Prox RCA-1 lesion is 99% stenosed.  Ost RCA lesion is 50% stenosed.  RPDA lesion is 100% stenosed.  A drug-eluting stent was successfully placed using a STENT RESOLUTE ONYX 3.5X22.  Post intervention, there  is a 0% residual stenosis.  Post intervention, there is a 0% residual stenosis.  1. Non-ST elevation myocardial infarction 2. 95% in-stent restenosis proximal RCA with occlusion mid to distal PDA 3. Successful PCI with DES in-stent restenosis proximal RCA Recommendations 1. Dual antiplatelet therapy uninterrupted for 1 year 2. Aggressive risk factor modification 3. Enroll in cardiac rehabilitation 4. Follow-up as outpatient in 1 week with Dr. Ubaldo Glassing    CARDIAC CATHETERIZATION  Result Date: 11/10/2019  Mid RCA lesion is 5% stenosed.  Prox RCA-1 lesion is 99% stenosed.  Prox RCA-2 lesion is 15% stenosed.  Ost RCA lesion is 50% stenosed.  LV end diastolic pressure is normal.  The left ventricular ejection fraction is 45-50% by visual estimate.  There is no aortic valve stenosis.  There is no mitral valve stenosis and no mitral valve prolapse evident.  LM-normal LAD-no significant disease LCx-no significant dissease RCA-50% ostial, 99% proximal stenosis proximal to first rca stent. Previously placed stents are widely patent Refer to Dr. Saralyn Pilar for consideration for pci of rca.   ASSESSMENT AND PLAN:   NSTEMI: trop PEAK AT 4154.  - s/p cath & PCI of the proximal RCA on 6/7 - dual antiplatelet therapy for 1 yr - prn Nitroglycerin, Morphine, and aspirin, Crestor  HTN:  -Continue Lisinopril, metoprolol, spironolactone -hydralazine prn  HLD (hyperlipidemia) -Crestor  Chronic obstructive pulmonary disease ([place]): stable. -prn albuterol  Chronic systolic CHF: 2D echo on 20/35/5974 showed EF of 40%.  Patient does not have leg edema.  No shortness of breath.  No pulmonary edema chest x-ray.  CHF seems to be compensated -Continue home spironolactone      Consults called:  Dr. Ubaldo Glassing of card     Status is: Inpatient  Remains inpatient appropriate because:Unsafe d/c plan   Dispo: The patient is from: Home              Anticipated d/c is to: Home              Anticipated d/c date is: 1 day              Patient currently is not medically stable to d/c. finished cath today. Cardio would like to monitor for reperfusion arrhythmias.    DVT prophylaxis: Lovenox Family Communication: patient's wife & daughter at bed side   All the records are reviewed and case discussed with Care Management/Social Worker. Management plans discussed with the patient, family and they are in agreement.  CODE STATUS: Full Code  TOTAL  TIME TAKING CARE OF THIS PATIENT: 35 minutes.   More than 50% of the time was spent in counseling/coordination of care: YES  POSSIBLE D/C IN 1 DAYS, DEPENDING ON CLINICAL CONDITION.   Max Sane M.D on 11/10/2019 at 4:45 PM  [facility]   CC: Primary care physician; [name], PA  Note: This dictation was prepared with Dragon dictation along with smaller phrase technology. Any transcriptional errors that result from this process are unintentional.

## 2019-11-10 NOTE — Progress Notes (Signed)
Cardiac cath completed this morning revealing insignificant disease in the left system.  RCA had a 50% ostial, 99% proximal stenosis proximal to his 2 RCA stents which were widely patent.  Patient had a 3.5 x 15 and 3.5 x 12 mm bare-metal stent in this distribution.  Underwent PCI of the proximal RCA this morning.  Will place on dual antiplatelet therapy.  Will follow post MI and plan discharge in the morning if stable.

## 2019-11-10 NOTE — Progress Notes (Signed)
Patient back from cath lab, Right groin is level 0, vss, no complains of pain, will continue to monitor.

## 2019-11-10 NOTE — Progress Notes (Signed)
PHARMACIST - PHYSICIAN COMMUNICATION  CONCERNING:  Enoxaparin (Lovenox) for DVT Prophylaxis   RECOMMENDATION: Patient was prescribed enoxaprin 40mg  q24 hours for VTE prophylaxis.   Filed Weights   11/09/19 1934 11/10/19 0624 11/10/19 0745  Weight: 118.1 kg (260 lb 4.8 oz) 118.2 kg (260 lb 9.3 oz) 118 kg (260 lb 2.3 oz)    Body mass index is 40.74 kg/m.  Estimated Creatinine Clearance: 104.1 mL/min (A) (by C-G formula based on SCr of 0.43 mg/dL (L)).  Based on [place] patient is candidate for enoxaparin 40mg  every 12 hour dosing due to BMI being >40.  DESCRIPTION: Pharmacy has adjusted enoxaparin dose per [facility] policy.  Patient is now receiving enoxaparin 40mg  every 12 hours.   Pernell Dupre, PharmD, BCPS Clinical Pharmacist 11/10/2019 5:05 PM

## 2019-11-11 ENCOUNTER — Encounter: Payer: Self-pay | Admitting: Cardiology

## 2019-11-11 DIAGNOSIS — I25110 Atherosclerotic heart disease of native coronary artery with unstable angina pectoris: Secondary | ICD-10-CM

## 2019-11-11 DIAGNOSIS — I200 Unstable angina: Secondary | ICD-10-CM

## 2019-11-11 LAB — BASIC METABOLIC PANEL
Anion gap: 5 (ref 5–15)
BUN: 11 mg/dL (ref 8–23)
CO2: 26 mmol/L (ref 22–32)
Calcium: 8.2 mg/dL — ABNORMAL LOW (ref 8.9–10.3)
Chloride: 108 mmol/L (ref 98–111)
Creatinine, Ser: 0.46 mg/dL — ABNORMAL LOW (ref 0.61–1.24)
GFR calc Af Amer: >60 mL/min (ref >60)
GFR calc non Af Amer: >60 mL/min (ref >60)
Glucose, Bld: 99 mg/dL (ref 70–99)
Potassium: 3.4 mmol/L — ABNORMAL LOW (ref 3.5–5.1)
Sodium: 139 mmol/L (ref 135–145)

## 2019-11-11 LAB — CBC
HCT: 32.3 % — ABNORMAL LOW (ref 39.0–52.0)
Hemoglobin: 10.7 g/dL — ABNORMAL LOW (ref 13.0–17.0)
MCH: 29.6 pg (ref 26.0–34.0)
MCHC: 33.1 g/dL (ref 30.0–36.0)
MCV: 89.5 fL (ref 80.0–100.0)
Platelets: 168 10*3/uL (ref 150–400)
RBC: 3.61 MIL/uL — ABNORMAL LOW (ref 4.22–5.81)
RDW: 13.7 % (ref 11.5–15.5)
WBC: 7.4 10*3/uL (ref 4.0–10.5)
nRBC: 0 % (ref 0.0–0.2)

## 2019-11-11 MED ORDER — MAGNESIUM OXIDE 400 (241.3 MG) MG PO TABS
400.0000 mg | ORAL_TABLET | Freq: Every day | ORAL | Status: DC
  Administered 2019-11-11: 400 mg via ORAL
  Filled 2019-11-11: qty 1

## 2019-11-11 MED ORDER — ROSUVASTATIN CALCIUM 20 MG PO TABS: 20.0000 mg | ORAL_TABLET | Freq: Every day | ORAL | 0 refills | Status: DC

## 2019-11-11 MED ORDER — CLOPIDOGREL BISULFATE 75 MG PO TABS: 75.0000 mg | ORAL_TABLET | Freq: Every day | ORAL | 0 refills | Status: DC

## 2019-11-11 MED ORDER — ROSUVASTATIN CALCIUM 10 MG PO TABS
20.0000 mg | ORAL_TABLET | Freq: Every day | ORAL | Status: DC
  Administered 2019-11-11: 20 mg via ORAL
  Filled 2019-11-11: qty 2

## 2019-11-11 NOTE — Progress Notes (Signed)
Patient Name: William Armstrong Date of Encounter: 11/11/2019  Hospital Problem List     Principal Problem:   Chest pain Active Problems:   Essential (primary) hypertension   HLD (hyperlipidemia)   CAD (coronary artery disease)   Chronic obstructive pulmonary disease (HCC)   Elevated troponin   Chronic systolic CHF (congestive heart failure) (HCC)   NSTEMI (non-ST elevated myocardial infarction) [facility])    Patient Profile     69 y.o. male with history of coronary artery disease followed at [facility], history of PCI of the RCA in 2013, negative functional study other than prior inferior infarction by Myoview in 2016 with no reversible ischemia, history of COPD, hyperlipidemia, obesity status post bariatric surgery with loss of over 200 pounds, history of chronic systolic heart failure with a known ejection fraction of 47% by functional study in 2016.  He presented to the emergency room with several hours of midsternal and back pain.  EKG on presentation revealed his usual sinus rhythm with right bundle branch block and left posterior fascicular block.  There were no changes from baseline.  He was given sublingual nitroglycerin with  improvement.  He last saw his cardiologist in October 2020 and was doing well at that time on spironolactone 25 mg daily, aspirin 81 mg daily, lisinopril 40 mg daily, potassium citrate 15 mEq daily, rosuvastatin 10 mg daily, sertraline 100 mg daily.  He has been pain-free since admission.  He ruled in for non-ST elevation myocardial infarction.  Peak high-sensitivity troponin is 4154, HDL is 29, LDL is 33.Recieved Resolute Onyx 3.5x22 stent in proximal rca. Cath revealed insignificant disease in the left system. 99% stenosis proxiimal rca, patent stents in mid rca and occluded RPDA.   Subjective   Doing well post pci. No chest pain or cath site issues.   Inpatient Medications    . aspirin  81 mg Oral Daily  . cholecalciferol  2,000 Units Oral Daily  .  clopidogrel  75 mg Oral Q breakfast  . enoxaparin (LOVENOX) injection  40 mg Subcutaneous Q12H  . latanoprost  1 drop Both Eyes QHS  . lisinopril  40 mg Oral Daily  . magnesium oxide  400 mg Oral Daily  . rosuvastatin  20 mg Oral Daily  . sertraline  100 mg Oral Daily  . sodium chloride flush  3 mL Intravenous Q12H  . sodium chloride flush  3 mL Intravenous Q12H  . spironolactone  25 mg Oral Daily    Vital Signs    Vitals:   11/10/19 1930 11/11/19 0301 11/11/19 0303 11/11/19 0749  BP: 112/66 125/69  121/61  Pulse: (!) 48 (!) 52  (!) 48  Resp: 18 16  18   Temp: 98.3 F (36.8 C) 98.2 F (36.8 C)  98.3 F (36.8 C)  TempSrc: Oral Oral    SpO2: 97% 93%  94%  Weight:   119.5 kg   Height:        Intake/Output Summary (Last 24 hours) at 11/11/2019 0832 Last data filed at 11/10/2019 2200 Gross per 24 hour  Intake 475.84 ml  Output 1425 ml  Net -949.16 ml   Filed Weights   11/10/19 0624 11/10/19 0745 11/11/19 0303  Weight: 118.2 kg 118 kg 119.5 kg    Physical Exam    GEN: Well nourished, well developed, in no acute distress.  HEENT: normal.  Neck: Supple, no JVD, carotid bruits, or masses. Cardiac: RRR, no murmurs, rubs, or gallops. No clubbing, cyanosis, edema.  Radials/DP/PT 2+  and equal bilaterally.  Respiratory:  Respirations regular and unlabored, clear to auscultation bilaterally. GI: Soft, nontender, nondistended, BS + x 4. MS: no deformity or atrophy. Skin: warm and dry, no rash. Neuro:  Strength and sensation are intact. Psych: Normal affect.  Labs    CBC Recent Labs    11/10/19 0038 11/11/19 0539  WBC 8.0 7.4  HGB 11.2* 10.7*  HCT 34.3* 32.3*  MCV 91.7 89.5  PLT 187 161   Basic Metabolic Panel Recent Labs    11/10/19 0038 11/11/19 0539  NA 140 139  K 3.8 3.4*  CL 109 108  CO2 25 26  GLUCOSE 105* 99  BUN 15 11  CREATININE 0.43* 0.46*  CALCIUM 8.2* 8.2*   Liver Function Tests No results for input(s): AST, ALT, ALKPHOS, BILITOT, PROT, ALBUMIN  in the last 72 hours. No results for input(s): LIPASE, AMYLASE in the last 72 hours. Cardiac Enzymes No results for input(s): CKTOTAL, CKMB, CKMBINDEX, TROPONINI in the last 72 hours. BNP Recent Labs    11/09/19 1300  BNP 51.3   D-Dimer No results for input(s): DDIMER in the last 72 hours. Hemoglobin A1C Recent Labs    11/10/19 0038  HGBA1C 5.1   Fasting Lipid Panel Recent Labs    11/10/19 0038  CHOL 70  HDL 29*  LDLCALC 33  TRIG 40  CHOLHDL 2.4   Thyroid Function Tests No results for input(s): TSH, T4TOTAL, T3FREE, THYROIDAB in the last 72 hours.  Invalid input(s): FREET3  Telemetry    Sinus bradycardia  ECG    nsr rbbb, lpfb  Radiology    DG Chest 2 View  Result Date: 11/09/2019 CLINICAL DATA:  Chest pain. Pain started 45 minutes ago. Pain radiates to back. History of MI with stents. EXAM: CHEST - 2 VIEW COMPARISON:  11/18/2016 FINDINGS: Stable elevation of the RIGHT hemidiaphragm. Heart size is normal. There is minimal subsegmental atelectasis at the lung bases. No consolidations or pulmonary edema. IMPRESSION: Minimal bibasilar atelectasis. Electronically Signed   By: Nolon Nations M.D.   On: 11/09/2019 13:37   CARDIAC CATHETERIZATION  Result Date: 11/10/2019  Prox RCA-2 lesion is 15% stenosed.  Mid RCA lesion is 5% stenosed.  Prox RCA-1 lesion is 99% stenosed.  Ost RCA lesion is 50% stenosed.  RPDA lesion is 100% stenosed.  A drug-eluting stent was successfully placed using a STENT RESOLUTE ONYX 3.5X22.  Post intervention, there is a 0% residual stenosis.  Post intervention, there is a 0% residual stenosis.  1. Non-ST elevation myocardial infarction 2. 95% in-stent restenosis proximal RCA with occlusion mid to distal PDA 3. Successful PCI with DES in-stent restenosis proximal RCA Recommendations 1. Dual antiplatelet therapy uninterrupted for 1 year 2. Aggressive risk factor modification 3. Enroll in cardiac rehabilitation 4. Follow-up as outpatient in 1  week with Dr. Ubaldo Glassing   CARDIAC CATHETERIZATION  Result Date: 11/10/2019  Mid RCA lesion is 5% stenosed.  Prox RCA-1 lesion is 99% stenosed.  Prox RCA-2 lesion is 15% stenosed.  Ost RCA lesion is 50% stenosed.  LV end diastolic pressure is normal.  The left ventricular ejection fraction is 45-50% by visual estimate.  There is no aortic valve stenosis.  There is no mitral valve stenosis and no mitral valve prolapse evident.  LM-normal LAD-no significant disease LCx-no significant dissease RCA-50% ostial, 99% proximal stenosis proximal to first rca stent. Previously placed stents are widely patent Refer to Dr. Saralyn Pilar for consideration for pci of rca.    Assessment & Plan  Non-ST elevation myocardial infarction-patient ruled in for a non-ST elevation myocardial infarction.  Culprit vessel was the RCA.  Had a proximal 99% stenosis with occluded RPDA.  Received a resolute Onyx 3.5 x 22 mm stent with 0 residual stenosis.  No critical disease in the remainder of his system.  EF 45-50.  Currently on aspirin, Plavix, lisinopril, store, spironolactone.  Was on 12.5 twice daily of metoprolol tartrate however remains fairly bradycardic with this so this has been discontinued.  Will ambulate and plan discharge today.  Outpatient follow-up with his cardiologist Dr. [name] in [place].  Cardiac rehab has been ordered.  Signed, Javier Docker Vint Pola MD 11/11/2019, 8:32 AM  Pager: [phone]

## 2019-11-11 NOTE — Discharge Instructions (Signed)
Chest Wall Pain Chest wall pain is pain in or around the bones and muscles of your chest. Chest wall pain may be caused by:  An injury.  Coughing a lot.  Using your chest and arm muscles too much. Sometimes, the cause may not be known. This pain may take a few weeks or longer to get better. Follow these instructions at home: Managing pain, stiffness, and swelling If told, put ice on the painful area:  Put ice in a plastic bag.  Place a towel between your skin and the bag.  Leave the ice on for 20 minutes, 2-3 times a day.  Activity  Rest as told by your doctor.  Avoid doing things that cause pain. This includes lifting heavy items.  Ask your doctor what activities are safe for you. General instructions   Take over-the-counter and prescription medicines only as told by your doctor.  Do not use any products that contain nicotine or tobacco, such as cigarettes, e-cigarettes, and chewing tobacco. If you need help quitting, ask your doctor.  Keep all follow-up visits as told by your doctor. This is important. Contact a doctor if:  You have a fever.  Your chest pain gets worse.  You have new symptoms. Get help right away if:  You feel sick to your stomach (nauseous) or you throw up (vomit).  You feel sweaty or light-headed.  You have a cough with mucus from your lungs (sputum) or you cough up blood.  You are short of breath. These symptoms may be an emergency. Do not wait to see if the symptoms will go away. Get medical help right away. Call your local emergency services (911 in the [place] Do not drive yourself to the hospital. Summary  Chest wall pain is pain in or around the bones and muscles of your chest.  It may be treated with ice, rest, and medicines. Your condition may also get better if you avoid doing things that cause pain.  Contact a doctor if you have a fever, chest pain that gets worse, or new symptoms.  Get help right away if you feel  light-headed or you get short of breath. These symptoms may be an emergency. This information is not intended to replace advice given to you by your health care provider. Make sure you discuss any questions you have with your health care provider. Document Revised: 11/22/2017 Document Reviewed: 11/22/2017 Elsevier Patient Education  [place].   Chest Wall Pain Chest wall pain is pain in or around the bones and muscles of your chest. Chest wall pain may be caused by:  An injury.  Coughing a lot.  Using your chest and arm muscles too much. Sometimes, the cause may not be known. This pain may take a few weeks or longer to get better. Follow these instructions at home: Managing pain, stiffness, and swelling If told, put ice on the painful area:  Put ice in a plastic bag.  Place a towel between your skin and the bag.  Leave the ice on for 20 minutes, 2-3 times a day.  Activity  Rest as told by your doctor.  Avoid doing things that cause pain. This includes lifting heavy items.  Ask your doctor what activities are safe for you. General instructions   Take over-the-counter and prescription medicines only as told by your doctor.  Do not use any products that contain nicotine or tobacco, such as cigarettes, e-cigarettes, and chewing tobacco. If you need help quitting, ask your doctor.  Keep all follow-up visits as told by your doctor. This is important. Contact a doctor if:  You have a fever.  Your chest pain gets worse.  You have new symptoms. Get help right away if:  You feel sick to your stomach (nauseous) or you throw up (vomit).  You feel sweaty or light-headed.  You have a cough with mucus from your lungs (sputum) or you cough up blood.  You are short of breath. These symptoms may be an emergency. Do not wait to see if the symptoms will go away. Get medical help right away. Call your local emergency services (911 in the [place] Do not drive yourself to  the hospital. Summary  Chest wall pain is pain in or around the bones and muscles of your chest.  It may be treated with ice, rest, and medicines. Your condition may also get better if you avoid doing things that cause pain.  Contact a doctor if you have a fever, chest pain that gets worse, or new symptoms.  Get help right away if you feel light-headed or you get short of breath. These symptoms may be an emergency. This information is not intended to replace advice given to you by your health care provider. Make sure you discuss any questions you have with your health care provider. Document Revised: 11/22/2017 Document Reviewed: 11/22/2017 Elsevier Patient Education  [place]. Coronary Angiogram With Stent Coronary angiogram with stent placement is a procedure to widen or open a narrow blood vessel of the heart (coronary artery). Arteries may become blocked by cholesterol buildup (plaques) in the lining of the artery wall. When a coronary artery becomes partially blocked, blood flow to that area decreases. This may lead to chest pain or a heart attack (myocardial infarction). A stent is a small piece of metal that looks like mesh or spring. Stent placement may be done as treatment after a heart attack, or to prevent a heart attack if a blocked artery is found by a coronary angiogram. Let your health care provider know about:  Any allergies you have, including allergies to medicines or contrast dye.  All medicines you are taking, including vitamins, herbs, eye drops, creams, and over-the-counter medicines.  Any problems you or family members have had with anesthetic medicines.  Any blood disorders you have.  Any surgeries you have had.  Any medical conditions you have, including kidney problems or kidney failure.  Whether you are pregnant or may be pregnant.  Whether you are breastfeeding. What are the risks? Generally, this is a safe procedure. However, serious problems  may occur, including:  Damage to nearby structures or organs, such as the heart, blood vessels, or kidneys.  A return of blockage.  Bleeding, infection, or bruising at the insertion site.  A collection of blood under the skin (hematoma) at the insertion site.  A blood clot in another part of the body.  Allergic reaction to medicines or dyes.  Bleeding into the abdomen (retroperitoneal bleeding).  Stroke (rare).  Heart attack (rare). What happens before the procedure? Staying hydrated Follow instructions from your health care provider about hydration, which may include:  Up to 2 hours before the procedure - you may continue to drink clear liquids, such as water, clear fruit juice, black coffee, and plain tea.  Eating and drinking restrictions Follow instructions from your health care provider about eating and drinking, which may include:  8 hours before the procedure - stop eating heavy meals or foods, such as meat, fried  foods, or fatty foods.  6 hours before the procedure - stop eating light meals or foods, such as toast or cereal.  2 hours before the procedure - stop drinking clear liquids. Medicines Ask your health care provider about:  Changing or stopping your regular medicines. This is especially important if you are taking diabetes medicines or blood thinners.  Taking medicines such as aspirin and ibuprofen. These medicines can thin your blood. Do not take these medicines unless your health care provider tells you to take them. ? Generally, aspirin is recommended before a thin tube, called a catheter, is passed through a blood vessel and inserted into the heart (cardiac catheterization).  Taking over-the-counter medicines, vitamins, herbs, and supplements. General instructions  Do not use any products that contain nicotine or tobacco for at least 4 weeks before the procedure. These products include cigarettes, e-cigarettes, and chewing tobacco. If you need help  quitting, ask your health care provider.  Plan to have someone take you home from the hospital or clinic.  If you will be going home right after the procedure, plan to have someone with you for 24 hours.  You may have tests and imaging procedures.  Ask your health care provider: ? How your insertion site will be marked. Ask which artery will be used for the procedure. ? What steps will be taken to help prevent infection. These may include:  Removing hair at the insertion site.  Washing skin with a germ-killing soap.  Taking antibiotic medicine. What happens during the procedure?   An IV will be inserted into one of your veins.  Electrodes may be placed on your chest to monitor your heart rate during the procedure.  You will be given one or more of the following: ? A medicine to help you relax (sedative). ? A medicine to numb the area (local anesthetic) for catheter insertion.  A small incision will be made for catheter insertion.  The catheter will be inserted into an artery using a guide wire. The location may be in your groin, your wrist, or the fold of your arm (near your elbow).  An X-ray procedure (fluoroscopy) will be used to help guide the catheter to the opening of the heart arteries.  A dye will be injected into the catheter. X-rays will be taken. The dye helps to show where any narrowing or blockages are located in the arteries.  Tell your health care provider if you have chest pain or trouble breathing.  A tiny wire will be guided to the blocked spot, and a balloon will be inflated to make the artery wider.  The stent will be expanded to crush the plaques into the wall of the vessel. The stent will hold the area open and improve the blood flow. Most stents have a drug coating to reduce the risk of the stent narrowing over time.  The artery may be made wider using a drill, laser, or other tools that remove plaques.  The catheter will be removed when the blood  flow improves. The stent will stay where it was placed, and the lining of the artery will grow over it.  A bandage (dressing) will be placed on the insertion site. Pressure will be applied to stop bleeding.  The IV will be removed. This procedure may vary among health care providers and hospitals. What happens after the procedure?  Your blood pressure, heart rate, breathing rate, and blood oxygen level will be monitored until you leave the hospital or clinic.  If  the procedure is done through the leg, you will lie flat in bed for a few hours or for as long as told by your health care provider. You will be instructed not to bend or cross your legs.  The insertion site and the pulse in your foot or wrist will be checked often.  You may have more blood tests, X-rays, and a test that records the electrical activity of your heart (electrocardiogram, or ECG).  Do not drive for 24 hours if you were given a sedative during your procedure. Summary  Coronary angiogram with stent placement is a procedure to widen or open a narrowed coronary artery. This is done to treat heart problems.  Before the procedure, let your health care provider know about all the medical conditions and surgeries you have or have had.  This is a safe procedure. However, some problems may occur, including damage to nearby structures or organs, bleeding, blood clots, or allergies.  Follow your health care provider's instructions about eating, drinking, medicines, and other lifestyle changes, such as quitting tobacco use before the procedure. This information is not intended to replace advice given to you by your health care provider. Make sure you discuss any questions you have with your health care provider. Document Revised: 12/11/2018 Document Reviewed: 12/11/2018 Elsevier Patient Education  [place]. Heart Attack A heart attack occurs when blood and oxygen supply to the heart is cut off. A heart attack causes  damage to the heart that cannot be fixed. A heart attack is also called a myocardial infarction, or MI. If you think you are having a heart attack, do not wait to see if the symptoms will go away. Get medical help right away. What are the causes? This condition may be caused by:  A fatty substance (plaque) in the blood vessels (arteries). This can block the flow of blood to the heart.  A blood clot in the blood vessels that go to the heart. The blood clot blocks blood flow.  Low blood pressure.  An abnormal heartbeat.  Some diseases, such as problems in red blood cells (anemia)orproblems in breathing (respiratory failure).  Tightening (spasm) of a blood vessel that cuts off blood to the heart.  A tear in a blood vessel of the heart.  High blood pressure. What increases the risk? The following factors may make you more likely to develop this condition:  Aging. The older you are, the higher your risk.  Having a personal or family history of chest pain, heart attack, stroke, or narrowing of the arteries in the legs, arms, head, or stomach (peripheral artery disease).  Being male.  Smoking.  Not getting regular exercise.  Being overweight or obese.  Having high blood pressure.  Having high cholesterol.  Having diabetes.  Drinking too much alcohol.  Using illegal drugs, such as cocaine or methamphetamine. What are the signs or symptoms? Symptoms of this condition include:  Chest pain. It may feel like: ? Crushing or squeezing. ? Tightness, pressure, fullness, or heaviness.  Pain in the arm, neck, jaw, back, or upper body.  Shortness of breath.  Heartburn.  Upset stomach (indigestion).  Feeling like you may vomit (nauseous).  Cold sweats.  Feeling tired.  Sudden light-headedness. How is this treated? A heart attack must be treated as soon as possible. Treatment may include:  Medicines to: ? Break up or dissolve blood clots. ? Thin blood and help prevent  blood clots. ? Treat blood pressure. ? Improve blood flow to the  heart. ? Reduce pain. ? Reduce cholesterol.  Procedures to widen a blocked artery and keep it open.  Open heart surgery.  Receiving oxygen.  Making your heart strong again (cardiac rehabilitation) through exercise, education, and counseling. Follow these instructions at home: Medicines  Take over-the-counter and prescription medicines only as told by your doctor. You may need to take medicine: ? To keep your blood from clotting too easily. ? To control blood pressure. ? To lower cholesterol. ? To control heart rhythms.  Do not take these medicines unless your doctor says it is okay: ? NSAIDs, such as ibuprofen. ? Supplements that have vitamin A, vitamin E, or both. ? Hormone replacement therapy that has estrogen with or without progestin. Lifestyle      Do not use any products that have nicotine or tobacco, such as cigarettes, e-cigarettes, and chewing tobacco. If you need help quitting, ask your doctor.  Avoid secondhand smoke.  Exercise regularly. Ask your doctor about a cardiac rehab program.  Eat heart-healthy foods. Your doctor will tell you what foods to eat.  Stay at a healthy weight.  Lower your stress level.  Do not use illegal drugs. Alcohol use  Do not drink alcohol if: ? Your doctor tells you not to drink. ? You are pregnant, may be pregnant, or are planning to become pregnant.  If you drink alcohol: ? Limit how much you use to:  0-1 drink a day for women.  0-2 drinks a day for men. ? Know how much alcohol is in your drink. In the [place] one drink equals one 12 oz bottle of beer (355 mL), one 5 oz glass of wine (148 mL), or one 1 oz glass of hard liquor (44 mL). General instructions  Work with your doctor to treat other problems you may have, such as diabetes or high blood pressure.  Get screened for depression. Get treatment if needed.  Keep your vaccines up to date. Get the  flu shot (influenza vaccine) every year.  Keep all follow-up visits as told by your doctor. This is important. Contact a doctor if:  You feel very sad.  You have trouble doing your daily activities. Get help right away if:  You have sudden, unexplained discomfort in your chest, arms, back, neck, jaw, or upper body.  You have shortness of breath.  You have sudden sweating or clammy skin.  You feel like you may vomit.  You vomit.  You feel tired or weak.  You get light-headed or dizzy.  You feel your heart beating fast.  You feel your heart skipping beats.  You have blood pressure that is higher than 180/120. These symptoms may be an emergency. Do not wait to see if the symptoms will go away. Get medical help right away. Call your local emergency services (911 in the [place] Do not drive yourself to the hospital. Summary  A heart attack occurs when blood and oxygen supply to the heart is cut off.  Do not take NSAIDs unless your doctor says it is okay.  Do not smoke. Avoid secondhand smoke.  Exercise regularly. Ask your doctor about a cardiac rehab program. This information is not intended to replace advice given to you by your health care provider. Make sure you discuss any questions you have with your health care provider. Document Revised: 09/02/2018 Document Reviewed: 09/02/2018 Elsevier Patient Education  [place].

## 2019-11-11 NOTE — Progress Notes (Signed)
Discharge instructions were reviewed w/pt. Opportunity for questions were made available. Pt verbalized understanding of d/c instructions. IV & telemetry were removed per protocol. Spouse is at the bedside to transport the pt home.

## 2019-11-11 NOTE — Plan of Care (Signed)
  Problem: Pain Managment: Goal: General experience of comfort will improve Outcome: Completed/Met   Problem: Safety: Goal: Ability to remain free from injury will improve Outcome: Completed/Met   Problem: Skin Integrity: Goal: Risk for impaired skin integrity will decrease Outcome: Completed/Met   Problem: Activity: Goal: Ability to tolerate increased activity will improve Outcome: Completed/Met   Problem: Cardiac: Goal: Ability to achieve and maintain adequate cardiovascular perfusion will improve Outcome: Completed/Met

## 2019-11-12 ENCOUNTER — Telehealth: Payer: Self-pay

## 2019-11-12 NOTE — Telephone Encounter (Signed)
Pt is scheduled for a telephonic AWV tomorrow at 2. Per chart pt had an AWV with Dr William Armstrong at [facility] 07/2019. [facility] to see if the G code was used for pt's AWV with Dr William Armstrong and the nure said she would check and CB. She also stated that he has an upcoming HFU apt with them. Pt is scheduled for a HFU with William Armstrong tomorrow, 11/13/19 @ 9:40 AM. William Armstrong pt to inquire about who his PCP was and he stated that he wanted to keep Dr William Armstrong as his PCP and see William Armstrong as needed. Pt preferred to see Dr William Armstrong for his HFU too. Cancelled HFU and telephonic AWV for tomorrow.  Pt would not qualify for an AWV with me if the G code was used for his AWV in February of this year as it needs to be 1 year since G code was used. FYI to PCP!

## 2019-11-12 NOTE — Discharge Summary (Signed)
[facility] NAME: William Armstrong    MR#:  [id number]  DATE OF BIRTH:  [birth year]  DATE OF ADMISSION:  11/09/2019   ADMITTING PHYSICIAN: Max Sane, MD  DATE OF DISCHARGE: 11/11/2019 11:13 AM  PRIMARY CARE PHYSICIAN: Baxter Hire, MD   ADMISSION DIAGNOSIS:  Chest pain [R07.9] Chest pain, unspecified type [R07.9] NSTEMI (non-ST elevated myocardial infarction) ([place]) [I21.4] DISCHARGE DIAGNOSIS:  Principal Problem:   Chest pain Active Problems:   Essential (primary) hypertension   HLD (hyperlipidemia)   CAD (coronary artery disease)   Chronic obstructive pulmonary disease (HCC)   Elevated troponin   Chronic systolic CHF (congestive heart failure) (HCC)   NSTEMI (non-ST elevated myocardial infarction) ([place])  SECONDARY DIAGNOSIS:   Past Medical History:  Diagnosis Date  . Anemia   . Arthritis   . BCC (basal cell carcinoma of skin)   . CHF (congestive heart failure) ([place])   . Complication of anesthesia    neck hurt for 2-3 days after general anesthesia  . COPD (chronic obstructive pulmonary disease) ([place])   . Coronary artery disease   . Depression   . Glaucoma   . Heart attack ([place])    2013  . History of kidney stones   . HTN (hypertension)   . Hyperglycemia    pt denies  . Hypertension   . Hypokalemia   . Obesity    morbid  . Sleep apnea    osa, CPAP has been dc'd after 249 lb weight loss   HOSPITAL COURSE:  69 y.o.malewith history ofcoronary artery disease followed at [facility] - , history of PCI of the RCA in 2013, history of COPD, hyperlipidemia, obesity status post bariatric surgery with loss of over 200 pounds, history of chronic systolic heart failure with a known ejection fraction of 47% by functional study in 2016. He presented to the emergency room with several hours of midsternal and back pain and was admitted as troponins were trending up with chest pain.    NSTEMI: trop peak 4154.  - s/p cath &  PCI of the proximal RCA on 6/7, EF 45 to 50% - dual antiplatelet therapy for 1 yr - Continue aspirin, Plavix, lisinopril, spironolactone.  Patient was on metoprolol 12.5 twice daily however remains fairly bradycardic so this has been discontinued.  Outpatient follow-up with his cardiologist Dr. [name] in [place].  Cardiac rehab at discharge  HTN:  -Continue Lisinopril, spironolactone  HLD (hyperlipidemia) -Crestor  Chronic obstructive pulmonary disease ([place]): stable. -prn albuterol  Chronic systolic WUJ:8J echo on 19/14/7829 showed EF of 40%. Patient does not have leg edema. No shortness of breath. No pulmonary edema chest x-ray. CHF seems to be compensated -Continue home spironolactone  DISCHARGE CONDITIONS:  Stable CONSULTS OBTAINED:  Treatment Team:  Teodoro Spray, MD DRUG ALLERGIES:   Allergies  Allergen Reactions  . Atorvastatin Other (See Comments)    Muscle aches. Other reaction(s): Other (See Comments) Muscle aches. Muscle pain  . Pregabalin Other (See Comments)    Muscle soreness Other reaction(s): Other (See Comments) Muscle soreness Severe muscle pain   DISCHARGE MEDICATIONS:   Allergies as of 11/11/2019      Reactions   Atorvastatin Other (See Comments)   Muscle aches. Other reaction(s): Other (See Comments) Muscle aches. Muscle pain   Pregabalin Other (See Comments)   Muscle soreness Other reaction(s): Other (See Comments) Muscle soreness Severe muscle pain      Medication  List    TAKE these medications   aspirin EC 81 MG tablet Take 81 mg by mouth daily.   clopidogrel 75 MG tablet Commonly known as: PLAVIX Take 1 tablet (75 mg total) by mouth daily with breakfast.   latanoprost 0.005 % ophthalmic solution Commonly known as: XALATAN Place 1 drop into both eyes at bedtime.   lisinopril 40 MG tablet Commonly known as: ZESTRIL Take 40 mg by mouth daily.   Potassium Citrate 15 MEQ (1620 MG) Tbcr Take 1 tablet by mouth  2 (two) times daily.   rosuvastatin 20 MG tablet Commonly known as: CRESTOR Take 1 tablet (20 mg total) by mouth daily. What changed:   medication strength  how much to take  when to take this   sertraline 100 MG tablet Commonly known as: ZOLOFT Take 1 tablet (100 mg total) by mouth daily.   spironolactone 25 MG tablet Commonly known as: ALDACTONE Take 25 mg by mouth daily.   Vitamin D 50 MCG (2000 UT) Caps Take 2,000 Units by mouth daily.      DISCHARGE INSTRUCTIONS:   DIET:  Cardiac diet DISCHARGE CONDITION:  Stable ACTIVITY:  Activity as tolerated OXYGEN:  Home Oxygen: No.  Oxygen Delivery: room air DISCHARGE LOCATION:  home   If you experience worsening of your admission symptoms, develop shortness of breath, life threatening emergency, suicidal or homicidal thoughts you must seek medical attention immediately by calling 911 or calling your MD immediately  if symptoms less severe.  You Must read complete instructions/literature along with all the possible adverse reactions/side effects for all the Medicines you take and that have been prescribed to you. Take any new Medicines after you have completely understood and accpet all the possible adverse reactions/side effects.   Please note  You were cared for by a hospitalist during your hospital stay. If you have any questions about your discharge medications or the care you received while you were in the hospital after you are discharged, you can call the unit and asked to speak with the hospitalist on call if the hospitalist that took care of you is not available. Once you are discharged, your primary care physician will handle any further medical issues. Please note that NO REFILLS for any discharge medications will be authorized once you are discharged, as it is imperative that you return to your primary care physician (or establish a relationship with a primary care physician if you do not have one) for your  aftercare needs so that they can reassess your need for medications and monitor your lab values.    On the day of Discharge:  VITAL SIGNS:  Blood pressure 121/61, pulse (!) 48, temperature 98.3 F (36.8 C), resp. rate 18, height 5\' 7"  (1.702 m), weight 119.5 kg, SpO2 94 %. PHYSICAL EXAMINATION:  GENERAL:  69 y.o.-year-old patient lying in the bed with no acute distress.  EYES: Pupils equal, round, reactive to light and accommodation. No scleral icterus. Extraocular muscles intact.  HEENT: Head atraumatic, normocephalic. Oropharynx and nasopharynx clear.  NECK:  Supple, no jugular venous distention. No thyroid enlargement, no tenderness.  LUNGS: Normal breath sounds bilaterally, no wheezing, rales,rhonchi or crepitation. No use of accessory muscles of respiration.  CARDIOVASCULAR: S1, S2 normal. No murmurs, rubs, or gallops.  ABDOMEN: Soft, non-tender, non-distended. Bowel sounds present. No organomegaly or mass.  EXTREMITIES: No pedal edema, cyanosis, or clubbing.  NEUROLOGIC: Cranial nerves II through XII are intact. Muscle strength 5/5 in all extremities. Sensation intact. Gait not checked.  PSYCHIATRIC: The patient is alert and oriented x 3.  SKIN: No obvious rash, lesion, or ulcer.  DATA REVIEW:   CBC Recent Labs  Lab 11/11/19 0539  WBC 7.4  HGB 10.7*  HCT 32.3*  PLT 168    Chemistries  Recent Labs  Lab 11/11/19 0539  NA 139  K 3.4*  CL 108  CO2 26  GLUCOSE 99  BUN 11  CREATININE 0.46*  CALCIUM 8.2*     Outpatient follow-up Follow-up Information    [facility]. Schedule an appointment as soon as possible for a visit in 1 week(s).   Contact information: [place] [address] [phone]        [name], [place]. Go on 11/13/2019.   Specialty: Family Medicine Why: Appointment at 9:40am Contact information: [address] [place] [address] [phone]            Management plans discussed with the  patient, family and they are in agreement.  CODE STATUS: Prior   TOTAL TIME TAKING CARE OF THIS PATIENT: 45 minutes.    Max Sane M.D on 11/12/2019 at 5:26 PM  [facility]   CC: Primary care physician; Baxter Hire, MD   Note: This dictation was prepared with Dragon dictation along with smaller phrase technology. Any transcriptional errors that result from this process are unintentional.

## 2019-11-13 ENCOUNTER — Ambulatory Visit: Payer: Medicare Other

## 2019-11-13 ENCOUNTER — Inpatient Hospital Stay: Payer: Medicare Other | Admitting: Family Medicine

## 2019-11-13 NOTE — Telephone Encounter (Signed)
Agree patient should maintain follow up with Dr. Edwina Barth as his PCP and cancel his AWV here.

## 2019-11-14 ENCOUNTER — Other Ambulatory Visit: Payer: Self-pay

## 2019-11-14 ENCOUNTER — Other Ambulatory Visit: Payer: Self-pay | Admitting: Family Medicine

## 2019-11-14 ENCOUNTER — Encounter: Payer: Self-pay | Admitting: Physician Assistant

## 2019-11-14 ENCOUNTER — Ambulatory Visit (INDEPENDENT_AMBULATORY_CARE_PROVIDER_SITE_OTHER): Payer: Medicare Other | Admitting: Physician Assistant

## 2019-11-14 ENCOUNTER — Ambulatory Visit
Admission: RE | Admit: 2019-11-14 | Discharge: 2019-11-14 | Disposition: A | Discharge status: Home / Self Care | Payer: Medicare Other | Source: Ambulatory Visit | Attending: Physician Assistant | Admitting: Physician Assistant

## 2019-11-14 ENCOUNTER — Ambulatory Visit
Admission: RE | Admit: 2019-11-14 | Discharge: 2019-11-14 | Disposition: A | Discharge status: Home / Self Care | Payer: Medicare Other | Attending: Physician Assistant | Admitting: Physician Assistant

## 2019-11-14 VITALS — BP 155/79 | HR 82 | Ht 68.0 in | Wt 266.0 lb

## 2019-11-14 DIAGNOSIS — N200 Calculus of kidney: Secondary | ICD-10-CM

## 2019-11-14 DIAGNOSIS — N201 Calculus of ureter: Secondary | ICD-10-CM

## 2019-11-14 LAB — URINALYSIS, COMPLETE
Bilirubin, UA: NEGATIVE
Leukocytes,UA: NEGATIVE
Nitrite, UA: NEGATIVE
Specific Gravity, UA: >1.03 — ABNORMAL HIGH (ref 1.005–1.030)
Urobilinogen, Ur: 1 mg/dL (ref 0.2–1.0)
pH, UA: 5 (ref 5.0–7.5)

## 2019-11-14 LAB — MICROSCOPIC EXAMINATION: RBC, Urine: >30 /hpf — ABNORMAL HIGH (ref 0–2)

## 2019-11-14 MED ORDER — ONDANSETRON HCL 4 MG PO TABS: 4.0000 mg | ORAL_TABLET | Freq: Three times a day (TID) | ORAL | 0 refills | Status: AC | PRN

## 2019-11-14 MED ORDER — TAMSULOSIN HCL 0.4 MG PO CAPS: 0.4000 mg | ORAL_CAPSULE | Freq: Every day | ORAL | 0 refills | Status: DC

## 2019-11-14 MED ORDER — OXYCODONE-ACETAMINOPHEN 5-325 MG PO TABS: 1.0000 | ORAL_TABLET | Freq: Four times a day (QID) | ORAL | 0 refills | Status: AC | PRN

## 2019-11-14 NOTE — Progress Notes (Signed)
11/14/2019 1:44 PM   William Armstrong November 29, 1950 [id number]  CC: Chief Complaint  Patient presents with  . Nephrolithiasis    HPI: William Armstrong is a 69 y.o. male with PMH recurrent nephrolithiasis with multiple spontaneously passed stones on potassium citrate 15 mEq twice daily who presents today for evaluation of possible acute stone episode. He is an established [facility] patient who last saw Dr. Erlene Quan on 07/23/2019 for annual follow-up of the above.  Notably, he was discharged from the hospital 3 days ago with NSTEMI and is POD4 from cardiac catheterization with coronary angiography and proximal RCA DES placement.  He has been started on dual antiplatelet therapy x1 year.  Today, patient reports a single episode of gross hematuria during his recent hospitalization that subsequently cleared.  He states his gross hematuria returned following discharge and he has passed small clots associated with this.  He has had right low back pain x2 days and groin pain starting this morning.  He has not taken any medication for treatment of his symptoms.  He denies fever, chills, nausea, and vomiting  KUB today reveals 2 stable calcifications overlying the left kidney, previously identified as a 4 mm left lower pole stone and a 9 mm benign bone island in the left 12th rib.  He is also noted to have multiple stable pelvic calcifications consistent with phleboliths.  In-office UA today positive for 3+ blood and 2+ protein; urine microscopy with >30 RBCs/HPF, and calcium oxalate crystals.  PMH: Past Medical History:  Diagnosis Date  . Anemia   . Arthritis   . BCC (basal cell carcinoma of skin)   . CHF (congestive heart failure) ([place])   . Complication of anesthesia    neck hurt for 2-3 days after general anesthesia  . COPD (chronic obstructive pulmonary disease) ([place])   . Coronary artery disease   . Depression   . Glaucoma   . Heart attack ([place])    2013  . History of kidney stones   . HTN  (hypertension)   . Hyperglycemia    pt denies  . Hypertension   . Hypokalemia   . Obesity    morbid  . Sleep apnea    osa, CPAP has been dc'd after 249 lb weight loss    Surgical History: Past Surgical History:  Procedure Laterality Date  . CORONARY STENT INTERVENTION N/A 11/10/2019   Procedure: CORONARY STENT INTERVENTION;  Surgeon: Isaias Cowman, MD;  Location: [facility];  Service: Cardiovascular;  Laterality: N/A;  . CORONARY STENT PLACEMENT  02/2012  . CYSTOSCOPY W/ RETROGRADES Left 11/18/2016   Procedure: CYSTOSCOPY WITH RETROGRADE PYELOGRAM;  Surgeon: Irine Seal, MD;  Location: [facility];  Service: Urology;  Laterality: Left;  . CYSTOSCOPY WITH STENT PLACEMENT Left 11/18/2016   Procedure: CYSTOSCOPY WITH STENT PLACEMENT;  Surgeon: Irine Seal, MD;  Location: [facility];  Service: Urology;  Laterality: Left;  . CYSTOSCOPY/URETEROSCOPY/HOLMIUM LASER/STENT PLACEMENT Left 11/29/2016   Procedure: CYSTOSCOPY/URETEROSCOPY/HOLMIUM LASER/STENT EXCHANGE;  Surgeon: Hollice Espy, MD;  Location: [facility];  Service: Urology;  Laterality: Left;  . CYSTOSCOPY/URETEROSCOPY/HOLMIUM LASER/STENT PLACEMENT Right 03/07/2017   Procedure: CYSTOSCOPY/URETEROSCOPY/HOLMIUM LASER/STENT PLACEMENT;  Surgeon: Hollice Espy, MD;  Location: [facility];  Service: Urology;  Laterality: Right;  . JOINT REPLACEMENT     right hip  . LAPAROSCOPIC GASTRIC RESTRICTIVE DUODENAL PROCEDURE (DUODENAL SWITCH)    . LAPAROSCOPIC GASTRIC SLEEVE RESECTION  08/21/2013  . LEFT HEART CATH AND CORONARY ANGIOGRAPHY N/A 11/10/2019   Procedure: LEFT HEART CATH AND CORONARY  ANGIOGRAPHY;  Surgeon: Teodoro Spray, MD;  Location: [facility];  Service: Cardiovascular;  Laterality: N/A;  . skin removal surgery     removal of extra skin approx 20 lbs  . STONE EXTRACTION WITH BASKET Right 03/07/2017   Procedure: STONE EXTRACTION WITH BASKET;  Surgeon: Hollice Espy, MD;  Location: [facility];  Service: Urology;   Laterality: Right;  . TONSILLECTOMY AND ADENOIDECTOMY  1960  . TOTAL HIP ARTHROPLASTY Right 01/18/2017   Procedure: TOTAL HIP ARTHROPLASTY ANTERIOR APPROACH;  Surgeon: Hessie Knows, MD;  Location: [facility];  Service: Orthopedics;  Laterality: Right;    Home Medications:  Allergies as of 11/14/2019      Reactions   Atorvastatin Other (See Comments)   Muscle aches. Other reaction(s): Other (See Comments) Muscle aches. Muscle pain   Pregabalin Other (See Comments)   Muscle soreness Other reaction(s): Other (See Comments) Muscle soreness Severe muscle pain      Medication List       Accurate as of November 14, 2019  1:44 PM. If you have any questions, ask your nurse or doctor.        aspirin EC 81 MG tablet Take 81 mg by mouth daily.   clopidogrel 75 MG tablet Commonly known as: PLAVIX Take 1 tablet (75 mg total) by mouth daily with breakfast.   latanoprost 0.005 % ophthalmic solution Commonly known as: XALATAN Place 1 drop into both eyes at bedtime.   lisinopril 40 MG tablet Commonly known as: ZESTRIL Take 40 mg by mouth daily.   ondansetron 4 MG tablet Commonly known as: Zofran Take 1 tablet (4 mg total) by mouth every 8 (eight) hours as needed for up to 14 days. Started by: Debroah Loop, PA-C   oxyCODONE-acetaminophen 5-325 MG tablet Commonly known as: PERCOCET/ROXICET Take 1-2 tablets by mouth every 6 (six) hours as needed for up to 5 days for severe pain. Started by: Debroah Loop, PA-C   Potassium Citrate 15 MEQ (1620 MG) Tbcr Take 1 tablet by mouth 2 (two) times daily.   rosuvastatin 20 MG tablet Commonly known as: CRESTOR Take 1 tablet (20 mg total) by mouth daily.   sertraline 100 MG tablet Commonly known as: ZOLOFT Take 1 tablet (100 mg total) by mouth daily.   spironolactone 25 MG tablet Commonly known as: ALDACTONE Take 25 mg by mouth daily.   tamsulosin 0.4 MG Caps capsule Commonly known as: FLOMAX Take 1 capsule (0.4 mg  total) by mouth daily. Started by: Debroah Loop, PA-C   Vitamin D 50 MCG (2000 UT) Caps Take 2,000 Units by mouth daily.       Allergies:  Allergies  Allergen Reactions  . Atorvastatin Other (See Comments)    Muscle aches. Other reaction(s): Other (See Comments) Muscle aches. Muscle pain  . Pregabalin Other (See Comments)    Muscle soreness Other reaction(s): Other (See Comments) Muscle soreness Severe muscle pain    Family History: Family History  Problem Relation Age of Onset  . Heart attack Mother   . Brain cancer Father   . Heart attack Sister   . Congenital heart disease Sister   . Leukemia Paternal Grandmother   . COPD Brother   . Heart disease Brother   . Prostate cancer Neg Hx   . Kidney cancer Neg Hx   . Bladder Cancer Neg Hx     Social History:   reports that he has never smoked. He has never used smokeless tobacco. He reports that he does not drink  alcohol and does not use drugs.  Physical Exam: BP (!) 155/79   Pulse 82   Ht '5\' 8"'$  (1.727 m)   Wt 266 lb (120.7 kg)   BMI 40.45 kg/m   Constitutional:  Alert and oriented, no acute distress, nontoxic appearing HEENT: [place], AT Cardiovascular: No clubbing, cyanosis, or edema Respiratory: Normal respiratory effort, no increased work of breathing Skin: No rashes, bruises or suspicious lesions Neurologic: Grossly intact, no focal deficits, moving all 4 extremities Psychiatric: Normal mood and affect  Laboratory Data: Results for orders placed or performed in visit on 11/14/19  CULTURE, URINE COMPREHENSIVE   Specimen: Urine   UR  Result Value Ref Range   Urine Culture, Comprehensive Preliminary report    Organism ID, Bacteria Comment   Microscopic Examination   Urine  Result Value Ref Range   WBC, UA 0-5 0 - 5 /hpf   RBC >30 (H) 0 - 2 /hpf   Epithelial Cells (non renal) 0-10 0 - 10 /hpf   Casts Present (A) None seen /lpf   Cast Type Hyaline casts N/A   Crystals Present (A) N/A   Crystal  Type Calcium Oxalate N/A   Bacteria, UA Few (A) None seen/Few  Urinalysis, Complete  Result Value Ref Range   Specific Gravity, UA >1.030 (H) 1.005 - 1.030   pH, UA 5.0 5.0 - 7.5   Color, UA Orange Yellow   Appearance Ur Cloudy (A) Clear   Leukocytes,UA Negative Negative   Protein,UA 2+ (A) Negative/Trace   Glucose, UA Trace (A) Negative   Ketones, UA Trace (A) Negative   RBC, UA 3+ (A) Negative   Bilirubin, UA Negative Negative   Urobilinogen, Ur 1.0 0.2 - 1.0 mg/dL   Nitrite, UA Negative Negative   Microscopic Examination See below:    Pertinent Imaging: KUB, 11/14/2019: CLINICAL DATA:  Gross hematuria. Right-sided pain. History of stones.  EXAM: CT ABDOMEN AND PELVIS WITHOUT CONTRAST  TECHNIQUE: Multidetector CT imaging of the abdomen and pelvis was performed following the standard protocol without IV contrast.  COMPARISON:  CT 11/18/2016  FINDINGS: Lower chest: Mild atelectasis at the right lung base. Extensive coronary artery calcification.  Hepatobiliary: 2.2 cm low-density in the left lobe of the liver as seen previously consistent with either a cyst or hemangioma. No calcified gallstones.  Pancreas: Normal  Spleen: Normal  Adrenals/Urinary Tract: Adrenal glands are normal. Left kidney shows a few small nonobstructing calculi, the largest in the lower pole measuring 5 mm. The right kidney shows swelling with hydroureteronephrosis. The ureter is dilated to the mid pelvic level where there is a 5 x 6 mm obstructing stone. No stone in the bladder. Several other nonobstructing calculi present in the right kidney, the largest in the lower pole measuring 7 mm.  Stomach/Bowel: Previous bowel anastomosis in the central abdomen. No acute bowel pathology.  Vascular/Lymphatic: Aortic atherosclerosis. No aortic aneurysm. IVC is normal. No retroperitoneal adenopathy.  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  Other: No  free fluid or air.  Musculoskeletal: Previous hip replacement on the right. Ordinary lumbar degenerative changes.  IMPRESSION: Hydroureteronephrosis on the right because of a 5 x 6 mm obstructing stone mid pelvic level of the ureter. Several other nonobstructing right renal calculi.  Several small nonobstructing renal calculi on the left.   Electronically Signed   By: Nelson Chimes M.D.   On: 11/14/2019 13:15  Stat CT stone study, 11/14/2019: CLINICAL DATA:  Right-sided pain.  Renal stone.  EXAM: ABDOMEN - 1  VIEW  COMPARISON:  CT same day  FINDINGS: 5 x 6 mm stone visible in the right mid pelvis. Multiple phleboliths present in the pelvis. Multiple small nonobstructing renal calculi, the largest the lower pole on the left measuring up to 8 mm. Bowel gas pattern is normal. No acute bone finding. Previous hip replacement.  IMPRESSION: 5 x 6 mm stone in the right ureter at the mid pelvis level.   Electronically Signed   By: Nelson Chimes M.D.   On: 11/14/2019 13:22  I personally reviewed the images referenced above and note a 5 x 6 mm distal right ureteral stone, subtle on KUB.  Assessment & Plan:   69 year old male with PMH recurrent nephrolithiasis with spontaneous passage of multiple stones and recent NSTEMI requiring cardiac stent placement 4 days ago presents with a 5-day history of intermittent gross hematuria as well as right low back pain and groin pain x2 days.  KUB with stable left renal stone, however symptoms and UA concerning for acute right sided stone episode.  Ordered stat CT stone study today for further evaluation which revealed a 5x82m distal right ureteral stone with upstream hydroureteronephrosis.  1. Right ureteral stone Patient has previously passed stones of this size or larger spontaneously.  Given his recent cardiac intervention, I do not recommend aggressive intervention at this time.  In discussion with the patient we have agreed to  proceed with conservative management with MET and supportive care.  Prescribing Flomax, Zofran, Percocet and counseled patient to stay well-hydrated.  Will send urine for culture.  Counseled patient proceed to the emergency department if he develops uncontrollable pain, nausea, vomiting, fever, or chills.  He expressed understanding.  We will plan for follow-up in clinic in 1 month with KUB prior. - Urinalysis, Complete - CULTURE, URINE COMPREHENSIVE - CT RENAL STONE STUDY - tamsulosin (FLOMAX) 0.4 MG CAPS capsule; Take 1 capsule (0.4 mg total) by mouth daily.  Dispense: 30 capsule; Refill: 0 - ondansetron (ZOFRAN) 4 MG tablet; Take 1 tablet (4 mg total) by mouth every 8 (eight) hours as needed for up to 14 days.  Dispense: 20 tablet; Refill: 0 - oxyCODONE-acetaminophen (PERCOCET/ROXICET) 5-325 MG tablet; Take 1-2 tablets by mouth every 6 (six) hours as needed for up to 5 days for severe pain.  Dispense: 15 tablet; Refill: 0 - DG Abd 1 View; Future   Return in about 4 weeks (around 12/12/2019) for Stone f/u with KUB prior.  SDebroah Loop PA-C  [facility] [address] [place] [phone]

## 2019-11-17 DIAGNOSIS — Z955 Presence of coronary angioplasty implant and graft: Secondary | ICD-10-CM | POA: Insufficient documentation

## 2019-11-18 LAB — CULTURE, URINE COMPREHENSIVE

## 2019-12-04 ENCOUNTER — Encounter: Payer: Medicare Other | Attending: Cardiology

## 2019-12-04 ENCOUNTER — Other Ambulatory Visit: Payer: Self-pay

## 2019-12-04 DIAGNOSIS — I214 Non-ST elevation (NSTEMI) myocardial infarction: Secondary | ICD-10-CM

## 2019-12-04 DIAGNOSIS — I252 Old myocardial infarction: Secondary | ICD-10-CM | POA: Insufficient documentation

## 2019-12-04 DIAGNOSIS — Z955 Presence of coronary angioplasty implant and graft: Secondary | ICD-10-CM | POA: Insufficient documentation

## 2019-12-04 NOTE — Progress Notes (Signed)
Virtual Visit completed. Patient informed on EP and RD appointment and 6 Minute walk test. Patient also informed of patient health questionnaires on My Chart. Patient Verbalizes understanding. Visit diagnosis can be found in [facility] 11/09/2019.

## 2019-12-11 ENCOUNTER — Other Ambulatory Visit: Payer: Self-pay

## 2019-12-11 VITALS — Ht 67.0 in | Wt 258.9 lb

## 2019-12-11 DIAGNOSIS — I252 Old myocardial infarction: Secondary | ICD-10-CM | POA: Diagnosis present

## 2019-12-11 DIAGNOSIS — I214 Non-ST elevation (NSTEMI) myocardial infarction: Secondary | ICD-10-CM

## 2019-12-11 DIAGNOSIS — Z955 Presence of coronary angioplasty implant and graft: Secondary | ICD-10-CM | POA: Diagnosis not present

## 2019-12-11 NOTE — Patient Instructions (Signed)
Patient Instructions  Patient Details  Name: William Armstrong MRN: [id number] Date of Birth: [birth year] Referring Provider:  Isaias Cowman, MD  Below are your personal goals for exercise, nutrition, and risk factors. Our goal is to help you stay on track towards obtaining and maintaining these goals. We will be discussing your progress on these goals with you throughout the program.  Initial Exercise Prescription:  Initial Exercise Prescription - 12/11/19 1500      Date of Initial Exercise RX and Referring Provider   Date 12/11/19    Referring Provider Paraschos      Treadmill   MPH 1    Grade 0    Minutes 15    METs 1.77      Recumbant Bike   Level 1    RPM 60    Minutes 15    METs 1.5      NuStep   Level 1    SPM 80    Minutes 15    METs 1.5      T5 Nustep   Level 1    SPM 80    Minutes 15    METs 1.5      Prescription Details   Frequency (times per week) 3    Duration Progress to 30 minutes of continuous aerobic without signs/symptoms of physical distress      Intensity   THRR 40-80% of Max Heartrate 104-134    Ratings of Perceived Exertion 11-13    Perceived Dyspnea 0-4      Resistance Training   Training Prescription Yes    Weight 5 lb    Reps 10-15           Exercise Goals: Frequency: Be able to perform aerobic exercise two to three times per week in program working toward 2-5 days per week of home exercise.  Intensity: Work with a perceived exertion of 11 (fairly light) - 15 (hard) while following your exercise prescription.  We will make changes to your prescription with you as you progress through the program.   Duration: Be able to do 30 to 45 minutes of continuous aerobic exercise in addition to a 5 minute warm-up and a 5 minute cool-down routine.   Nutrition Goals: Your personal nutrition goals will be established when you do your nutrition analysis with the dietician.  The following are general nutrition guidelines to  follow: Cholesterol < 200mg /day Sodium < 1500mg /day Fiber: Men over 50 yrs - 30 grams per day  Personal Goals:  Personal Goals and Risk Factors at Admission - 12/11/19 1514      Core Components/Risk Factors/Patient Goals on Admission    Weight Management Yes;Weight Loss    Intervention Weight Management: Develop a combined nutrition and exercise program designed to reach desired caloric intake, while maintaining appropriate intake of nutrient and fiber, sodium and fats, and appropriate energy expenditure required for the weight goal.;Weight Management: Provide education and appropriate resources to help participant work on and attain dietary goals.;Weight Management/Obesity: Establish reasonable short term and long term weight goals.    Admit Weight 258 lb 14.4 oz (117.4 kg)    Goal Weight: Short Term 250 lb (113.4 kg)    Goal Weight: Long Term 245 lb (111.1 kg)    Expected Outcomes Short Term: Continue to assess and modify interventions until short term weight is achieved;Long Term: Adherence to nutrition and physical activity/exercise program aimed toward attainment of established weight goal;Weight Maintenance: Understanding of the daily nutrition guidelines, which includes 25-35% calories from  fat, 7% or less cal from saturated fats, less than 200mg  cholesterol, less than 1.5gm of sodium, & 5 or more servings of fruits and vegetables daily;Weight Loss: Understanding of general recommendations for a balanced deficit meal plan, which promotes 1-2 lb weight loss per week and includes a negative energy balance of [phone] kcal/d;Understanding recommendations for meals to include 15-35% energy as protein, 25-35% energy from fat, 35-60% energy from carbohydrates, less than 200mg  of dietary cholesterol, 20-35 gm of total fiber daily    Heart Failure Yes    Intervention Provide a combined exercise and nutrition program that is supplemented with education, support and counseling about heart failure.  Directed toward relieving symptoms such as shortness of breath, decreased exercise tolerance, and extremity edema.    Expected Outcomes Improve functional capacity of life;Short term: Attendance in program 2-3 days a week with increased exercise capacity. Reported lower sodium intake. Reported increased fruit and vegetable intake. Reports medication compliance.;Short term: Daily weights obtained and reported for increase. Utilizing diuretic protocols set by physician.;Long term: Adoption of self-care skills and reduction of barriers for early signs and symptoms recognition and intervention leading to self-care maintenance.    Intervention Provide education on lifestyle modifcations including regular physical activity/exercise, weight management, moderate sodium restriction and increased consumption of fresh fruit, vegetables, and low fat dairy, alcohol moderation, and smoking cessation.;Monitor prescription use compliance.    Expected Outcomes Short Term: Continued assessment and intervention until BP is < 140/68mm HG in hypertensive participants. < 130/11mm HG in hypertensive participants with diabetes, heart failure or chronic kidney disease.;Long Term: Maintenance of blood pressure at goal levels.    Intervention Provide education and support for participant on nutrition & aerobic/resistive exercise along with prescribed medications to achieve LDL 70mg , HDL >40mg .    Expected Outcomes Short Term: Participant states understanding of desired cholesterol values and is compliant with medications prescribed. Participant is following exercise prescription and nutrition guidelines.;Long Term: Cholesterol controlled with medications as prescribed, with individualized exercise RX and with personalized nutrition plan. Value goals: LDL < 70mg , HDL > 40 mg.           Tobacco Use Initial Evaluation: Social History   Tobacco Use  Smoking Status Never Smoker  Smokeless Tobacco Never Used    Exercise Goals  and Review:  Exercise Goals    Row Name 12/11/19 1510             Exercise Goals   Increase Physical Activity Yes       Intervention Provide advice, education, support and counseling about physical activity/exercise needs.;Develop an individualized exercise prescription for aerobic and resistive training based on initial evaluation findings, risk stratification, comorbidities and participant's personal goals.       Expected Outcomes Short Term: Attend rehab on a regular basis to increase amount of physical activity.;Long Term: Add in home exercise to make exercise part of routine and to increase amount of physical activity.;Long Term: Exercising regularly at least 3-5 days a week.       Increase Strength and Stamina Yes       Intervention Provide advice, education, support and counseling about physical activity/exercise needs.;Develop an individualized exercise prescription for aerobic and resistive training based on initial evaluation findings, risk stratification, comorbidities and participant's personal goals.       Expected Outcomes Short Term: Increase workloads from initial exercise prescription for resistance, speed, and METs.;Short Term: Perform resistance training exercises routinely during rehab and add in resistance training at home;Long Term: Improve cardiorespiratory fitness, muscular  endurance and strength as measured by increased METs and functional capacity (6MWT)       Able to understand and use rate of perceived exertion (RPE) scale Yes       Intervention Provide education and explanation on how to use RPE scale       Expected Outcomes Short Term: Able to use RPE daily in rehab to express subjective intensity level;Long Term:  Able to use RPE to guide intensity level when exercising independently       Able to understand and use Dyspnea scale Yes       Intervention Provide education and explanation on how to use Dyspnea scale       Expected Outcomes Short Term: Able to use  Dyspnea scale daily in rehab to express subjective sense of shortness of breath during exertion;Long Term: Able to use Dyspnea scale to guide intensity level when exercising independently       Knowledge and understanding of Target Heart Rate Range (THRR) Yes       Intervention Provide education and explanation of THRR including how the numbers were predicted and where they are located for reference       Expected Outcomes Short Term: Able to state/look up THRR;Short Term: Able to use daily as guideline for intensity in rehab;Long Term: Able to use THRR to govern intensity when exercising independently       Able to check pulse independently Yes       Intervention Provide education and demonstration on how to check pulse in carotid and radial arteries.;Review the importance of being able to check your own pulse for safety during independent exercise       Expected Outcomes Short Term: Able to explain why pulse checking is important during independent exercise;Long Term: Able to check pulse independently and accurately       Understanding of Exercise Prescription Yes       Intervention Provide education, explanation, and written materials on patient's individual exercise prescription       Expected Outcomes Short Term: Able to explain program exercise prescription;Long Term: Able to explain home exercise prescription to exercise independently              Copy of goals given to participant.

## 2019-12-11 NOTE — Progress Notes (Signed)
Cardiac Individual Treatment Plan  Patient Details  Name: William Armstrong MRN: [id number] Date of Birth: 1950/09/04 Referring Provider:     Cardiac Rehab from 12/11/2019 in [facility] Cardiac and Pulmonary Rehab  Referring Provider Paraschos      Initial Encounter Date:    Cardiac Rehab from 12/11/2019 in [facility] Cardiac and Pulmonary Rehab  Date 12/11/19      Visit Diagnosis: NSTEMI (non-ST elevated myocardial infarction) [facility])  Patient's Home Medications on Admission:  Current Outpatient Medications:  .  aspirin EC 81 MG tablet, Take 81 mg by mouth daily., Disp: , Rfl:  .  Cholecalciferol (VITAMIN D) 2000 units CAPS, Take 2,000 Units by mouth daily., Disp: , Rfl:  .  clopidogrel (PLAVIX) 75 MG tablet, Take 1 tablet (75 mg total) by mouth daily with breakfast., Disp: 30 tablet, Rfl: 0 .  latanoprost (XALATAN) 0.005 % ophthalmic solution, Place 1 drop into both eyes at bedtime., Disp: , Rfl:  .  lisinopril (PRINIVIL,ZESTRIL) 40 MG tablet, Take 40 mg by mouth daily. , Disp: , Rfl:  .  Potassium Citrate 15 MEQ (1620 MG) TBCR, Take 1 tablet by mouth 2 (two) times daily., Disp: 60 tablet, Rfl: 11 .  rosuvastatin (CRESTOR) 20 MG tablet, Take 1 tablet (20 mg total) by mouth daily., Disp: 30 tablet, Rfl: 0 .  sertraline (ZOLOFT) 100 MG tablet, Take 1 tablet (100 mg total) by mouth daily., Disp: 90 tablet, Rfl: 3 .  spironolactone (ALDACTONE) 25 MG tablet, Take 25 mg by mouth daily., Disp: , Rfl:  .  tamsulosin (FLOMAX) 0.4 MG CAPS capsule, Take 1 capsule (0.4 mg total) by mouth daily., Disp: 30 capsule, Rfl: 0  Past Medical History: Past Medical History:  Diagnosis Date  . Anemia   . Arthritis   . BCC (basal cell carcinoma of skin)   . CHF (congestive heart failure) ([place])   . Complication of anesthesia    neck hurt for 2-3 days after general anesthesia  . COPD (chronic obstructive pulmonary disease) ([place])   . Coronary artery disease   . Depression   . Glaucoma   . Heart attack ([place])    2013    . History of kidney stones   . HTN (hypertension)   . Hyperglycemia    pt denies  . Hypertension   . Hypokalemia   . Obesity    morbid  . Sleep apnea    osa, CPAP has been dc'd after 249 lb weight loss    Tobacco Use: Social History   Tobacco Use  Smoking Status Never Smoker  Smokeless Tobacco Never Used    Labs: Recent Review Scientist, physiological    Labs for ITP Cardiac and Pulmonary Rehab Latest Ref Rng & Units 01/21/2013 11/10/2019   Cholestrol 0 - 200 mg/dL - 70   LDLCALC 0 - 99 mg/dL - 33   HDL >40 mg/dL - 29(L)   Trlycerides <150 mg/dL - 40   Hemoglobin A1c 4.8 - 5.6 % 5.7 5.1       Exercise Target Goals: Exercise Program Goal: Individual exercise prescription set using results from initial 6 min walk test and THRR while considering  patient's activity barriers and safety.   Exercise Prescription Goal: Initial exercise prescription builds to 30-45 minutes a day of aerobic activity, 2-3 days per week.  Home exercise guidelines will be given to patient during program as part of exercise prescription that the participant will acknowledge.   Education: Aerobic Exercise & Resistance Training: - Gives group verbal and  written instruction on the various components of exercise. Focuses on aerobic and resistive training programs and the benefits of this training and how to safely progress through these programs..   Education: Exercise & Equipment Safety: - Individual verbal instruction and demonstration of equipment use and safety with use of the equipment.   Cardiac Rehab from 12/11/2019 in [facility] Cardiac and Pulmonary Rehab  Date 12/11/19  Educator AS  Instruction Review Code 1- Verbalizes Understanding      Education: Exercise Physiology & General Exercise Guidelines: - Group verbal and written instruction with models to review the exercise physiology of the cardiovascular system and associated critical values. Provides general exercise guidelines with specific guidelines  to those with heart or lung disease.    Cardiac Rehab from 12/11/2019 in [facility] Cardiac and Pulmonary Rehab  Date 12/11/19  Educator AS  Instruction Review Code 1- Verbalizes Understanding      Education: Flexibility, Balance, Mind/Body Relaxation: Provides group verbal/written instruction on the benefits of flexibility and balance training, including mind/body exercise modes such as yoga, pilates and tai chi.  Demonstration and skill practice provided.   Activity Barriers & Risk Stratification:   6 Minute Walk:  6 Minute Walk    Row Name 12/11/19 1504         6 Minute Walk   Phase Initial     Distance 1000 feet     Walk Time 6 minutes     # of Rest Breaks 0     MPH 1.9     METS 1.5     VO2 Peak 5.37     Symptoms No     Resting HR 75 bpm     Resting BP 124/64     Resting Oxygen Saturation  95 %     Exercise Oxygen Saturation  during 6 min walk 95 %     Max Ex. HR 85 bpm     Max Ex. BP 150/64     2 Minute Post BP 122/62            Oxygen Initial Assessment:   Oxygen Re-Evaluation:   Oxygen Discharge (Final Oxygen Re-Evaluation):   Initial Exercise Prescription:  Initial Exercise Prescription - 12/11/19 1500      Date of Initial Exercise RX and Referring Provider   Date 12/11/19    Referring Provider Paraschos      Treadmill   MPH 1    Grade 0    Minutes 15    METs 1.77      Recumbant Bike   Level 1    RPM 60    Minutes 15    METs 1.5      NuStep   Level 1    SPM 80    Minutes 15    METs 1.5      T5 Nustep   Level 1    SPM 80    Minutes 15    METs 1.5      Prescription Details   Frequency (times per week) 3    Duration Progress to 30 minutes of continuous aerobic without signs/symptoms of physical distress      Intensity   THRR 40-80% of Max Heartrate 104-134    Ratings of Perceived Exertion 11-13    Perceived Dyspnea 0-4      Resistance Training   Training Prescription Yes    Weight 5 lb    Reps 10-15           Perform  Capillary Blood Glucose  checks as needed.  Exercise Prescription Changes:  Exercise Prescription Changes    Row Name 12/11/19 1500             Response to Exercise   Blood Pressure (Admit) 124/64       Blood Pressure (Exercise) 150/64       Blood Pressure (Exit) 122/62       Heart Rate (Admit) 75 bpm       Heart Rate (Exercise) 85 bpm       Heart Rate (Exit) 61 bpm       Oxygen Saturation (Admit) 95 %       Oxygen Saturation (Exercise) 95 %       Symptoms none              Exercise Comments:   Exercise Goals and Review:  Exercise Goals    Row Name 12/11/19 1510             Exercise Goals   Increase Physical Activity Yes       Intervention Provide advice, education, support and counseling about physical activity/exercise needs.;Develop an individualized exercise prescription for aerobic and resistive training based on initial evaluation findings, risk stratification, comorbidities and participant's personal goals.       Expected Outcomes Short Term: Attend rehab on a regular basis to increase amount of physical activity.;Long Term: Add in home exercise to make exercise part of routine and to increase amount of physical activity.;Long Term: Exercising regularly at least 3-5 days a week.       Increase Strength and Stamina Yes       Intervention Provide advice, education, support and counseling about physical activity/exercise needs.;Develop an individualized exercise prescription for aerobic and resistive training based on initial evaluation findings, risk stratification, comorbidities and participant's personal goals.       Expected Outcomes Short Term: Increase workloads from initial exercise prescription for resistance, speed, and METs.;Short Term: Perform resistance training exercises routinely during rehab and add in resistance training at home;Long Term: Improve cardiorespiratory fitness, muscular endurance and strength as measured by increased METs and functional  capacity (6MWT)       Able to understand and use rate of perceived exertion (RPE) scale Yes       Intervention Provide education and explanation on how to use RPE scale       Expected Outcomes Short Term: Able to use RPE daily in rehab to express subjective intensity level;Long Term:  Able to use RPE to guide intensity level when exercising independently       Able to understand and use Dyspnea scale Yes       Intervention Provide education and explanation on how to use Dyspnea scale       Expected Outcomes Short Term: Able to use Dyspnea scale daily in rehab to express subjective sense of shortness of breath during exertion;Long Term: Able to use Dyspnea scale to guide intensity level when exercising independently       Knowledge and understanding of Target Heart Rate Range (THRR) Yes       Intervention Provide education and explanation of THRR including how the numbers were predicted and where they are located for reference       Expected Outcomes Short Term: Able to state/look up THRR;Short Term: Able to use daily as guideline for intensity in rehab;Long Term: Able to use THRR to govern intensity when exercising independently       Able to check pulse independently Yes  Intervention Provide education and demonstration on how to check pulse in carotid and radial arteries.;Review the importance of being able to check your own pulse for safety during independent exercise       Expected Outcomes Short Term: Able to explain why pulse checking is important during independent exercise;Long Term: Able to check pulse independently and accurately       Understanding of Exercise Prescription Yes       Intervention Provide education, explanation, and written materials on patient's individual exercise prescription       Expected Outcomes Short Term: Able to explain program exercise prescription;Long Term: Able to explain home exercise prescription to exercise independently              Exercise Goals  Re-Evaluation :   Discharge Exercise Prescription (Final Exercise Prescription Changes):  Exercise Prescription Changes - 12/11/19 1500      Response to Exercise   Blood Pressure (Admit) 124/64    Blood Pressure (Exercise) 150/64    Blood Pressure (Exit) 122/62    Heart Rate (Admit) 75 bpm    Heart Rate (Exercise) 85 bpm    Heart Rate (Exit) 61 bpm    Oxygen Saturation (Admit) 95 %    Oxygen Saturation (Exercise) 95 %    Symptoms none           Nutrition:  Target Goals: Understanding of nutrition guidelines, daily intake of sodium <1576m, cholesterol <2038m calories 30% from fat and 7% or less from saturated fats, daily to have 5 or more servings of fruits and vegetables.  Education: Controlling Sodium/Reading Food Labels -Group verbal and written material supporting the discussion of sodium use in heart healthy nutrition. Review and explanation with models, verbal and written materials for utilization of the food label.   Education: General Nutrition Guidelines/Fats and Fiber: -Group instruction provided by verbal, written material, models and posters to present the general guidelines for heart healthy nutrition. Gives an explanation and review of dietary fats and fiber.   [facility]  Date 12/09/19  Instruction Review Code 3- Needs Reinforcement  [need identified]      Biometrics:    Nutrition Therapy Plan and Nutrition Goals:   Nutrition Assessments:  Nutrition Assessments - 12/09/19 0734      MEDFICTS Scores   Pre Score 27           MEDIFICTS Score Key:          ?70 Need to make dietary changes          40-70 Heart Healthy Diet         ? 40 Therapeutic Level Cholesterol Diet  Nutrition Goals Re-Evaluation:   Nutrition Goals Discharge (Final Nutrition Goals Re-Evaluation):   Psychosocial: Target Goals: Acknowledge presence or absence of significant depression and/or stress, maximize coping  skills, provide positive support system. Participant is able to verbalize types and ability to use techniques and skills needed for reducing stress and depression.   Education: Depression - Provides group verbal and written instruction on the correlation between heart/lung disease and depressed mood, treatment options, and the stigmas associated with seeking treatment.   Education: Sleep Hygiene -Provides group verbal and written instruction about how sleep can affect your health.  Define sleep hygiene, discuss sleep cycles and impact of sleep habits. Review good sleep hygiene tips.     Education: Stress and Anxiety: - Provides group verbal and written instruction about the health risks of elevated stress and causes  of high stress.  Discuss the correlation between heart/lung disease and anxiety and treatment options. Review healthy ways to manage with stress and anxiety.    Initial Review & Psychosocial Screening:  Initial Psych Review & Screening - 12/04/19 1438      Initial Review   Current issues with Current Psychotropic Meds;None Identified      Family Dynamics   Good Support System? Yes    Comments He can look to his wife, daughter and grandson for support.      Barriers   Psychosocial barriers to participate in program There are no identifiable barriers or psychosocial needs.;The patient should benefit from training in stress management and relaxation.      Screening Interventions   Interventions Encouraged to exercise;Provide feedback about the scores to participant;To provide support and resources with identified psychosocial needs    Expected Outcomes Short Term goal: Utilizing psychosocial counselor, staff and physician to assist with identification of specific Stressors or current issues interfering with healing process. Setting desired goal for each stressor or current issue identified.;Long Term Goal: Stressors or current issues are controlled or eliminated.;Short Term  goal: Identification and review with participant of any Quality of Life or Depression concerns found by scoring the questionnaire.;Long Term goal: The participant improves quality of Life and PHQ9 Scores as seen by post scores and/or verbalization of changes           Quality of Life Scores:   Quality of Life - 12/09/19 0734      Quality of Life   Select Quality of Life      Quality of Life Scores   Health/Function Pre 24.43 %    Socioeconomic Pre 25.48 %    Psych/Spiritual Pre 28.57 %    Family Pre 30 %    GLOBAL Pre 26.22 %          Scores of 19 and below usually indicate a poorer quality of life in these areas.  A difference of  2-3 points is a clinically meaningful difference.  A difference of 2-3 points in the total score of the Quality of Life Index has been associated with significant improvement in overall quality of life, self-image, physical symptoms, and general health in studies assessing change in quality of life.  PHQ-9: Recent Review Flowsheet Data    Depression screen [facility] 2/9 12/11/2019 10/10/2016 10/10/2016   Decreased Interest 0 0 0   Down, Depressed, Hopeless 0 0 0   PHQ - 2 Score 0 0 0   Altered sleeping 0 0 -   Tired, decreased energy 0 0 -   Change in appetite 0 0 -   Feeling bad or failure about yourself  0 0 -   Trouble concentrating 0 0 -   Moving slowly or fidgety/restless 0 0 -   Suicidal thoughts 0 0 -   PHQ-9 Score 0 0 -     Interpretation of Total Score  Total Score Depression Severity:  1-4 = Minimal depression, 5-9 = Mild depression, 10-14 = Moderate depression, 15-19 = Moderately severe depression, 20-27 = Severe depression   Psychosocial Evaluation and Intervention:  Psychosocial Evaluation - 12/04/19 1439      Psychosocial Evaluation & Interventions   Interventions Encouraged to exercise with the program and follow exercise prescription    Comments He can look to his wife, daughter and grandson for support. Patient did the [facility]  program about 7 or eight years ago. He is feeling positive about his health and is ready  for Rehab.    Continue Psychosocial Services  Follow up required by staff           Psychosocial Re-Evaluation:   Psychosocial Discharge (Final Psychosocial Re-Evaluation):   Vocational Rehabilitation: Provide vocational rehab assistance to qualifying candidates.   Vocational Rehab Evaluation & Intervention:   Education: Education Goals: Education classes will be provided on a variety of topics geared toward better understanding of heart health and risk factor modification. Participant will state understanding/return demonstration of topics presented as noted by education test scores.  Learning Barriers/Preferences:   General Cardiac Education Topics:  AED/CPR: - Group verbal and written instruction with the use of models to demonstrate the basic use of the AED with the basic ABC's of resuscitation.   Anatomy & Physiology of the Heart: - Group verbal and written instruction and models provide basic cardiac anatomy and physiology, with the coronary electrical and arterial systems. Review of Valvular disease and Heart Failure   Cardiac Procedures: - Group verbal and written instruction to review commonly prescribed medications for heart disease. Reviews the medication, class of the drug, and side effects. Includes the steps to properly store meds and maintain the prescription regimen. (beta blockers and nitrates)   Cardiac Medications I: - Group verbal and written instruction to review commonly prescribed medications for heart disease. Reviews the medication, class of the drug, and side effects. Includes the steps to properly store meds and maintain the prescription regimen.   Cardiac Medications II: -Group verbal and written instruction to review commonly prescribed medications for heart disease. Reviews the medication, class of the drug, and side effects. (all other drug  classes)    Go Sex-Intimacy & Heart Disease, Get SMART - Goal Setting: - Group verbal and written instruction through game format to discuss heart disease and the return to sexual intimacy. Provides group verbal and written material to discuss and apply goal setting through the application of the S.M.A.R.T. Method.   Other Matters of the Heart: - Provides group verbal, written materials and models to describe Stable Angina and Peripheral Artery. Includes description of the disease process and treatment options available to the cardiac patient.   Infection Prevention: - Provides verbal and written material to individual with discussion of infection control including proper hand washing and proper equipment cleaning during exercise session.   Cardiac Rehab from 12/11/2019 in [facility] Cardiac and Pulmonary Rehab  Date 12/11/19  Educator AS  Instruction Review Code 1- Verbalizes Understanding      Falls Prevention: - Provides verbal and written material to individual with discussion of falls prevention and safety.   Cardiac Rehab from 12/11/2019 in [facility] Cardiac and Pulmonary Rehab  Date 12/11/19  Educator AS  Instruction Review Code 1- Verbalizes Understanding      Other: -Provides group and verbal instruction on various topics (see comments)   Knowledge Questionnaire Score:  Knowledge Questionnaire Score - 12/09/19 0735      Knowledge Questionnaire Score   Pre Score 23/26 Education Focus: Nutrition and Exercise           Core Components/Risk Factors/Patient Goals at Admission:  Personal Goals and Risk Factors at Admission - 12/11/19 1514      Core Components/Risk Factors/Patient Goals on Admission    Weight Management Yes;Weight Loss    Intervention Weight Management: Develop a combined nutrition and exercise program designed to reach desired caloric intake, while maintaining appropriate intake of nutrient and fiber, sodium and fats, and appropriate energy expenditure required  for the weight goal.;Weight Management:  Provide education and appropriate resources to help participant work on and attain dietary goals.;Weight Management/Obesity: Establish reasonable short term and long term weight goals.    Admit Weight 258 lb 14.4 oz (117.4 kg)    Goal Weight: Short Term 250 lb (113.4 kg)    Goal Weight: Long Term 245 lb (111.1 kg)    Expected Outcomes Short Term: Continue to assess and modify interventions until short term weight is achieved;Long Term: Adherence to nutrition and physical activity/exercise program aimed toward attainment of established weight goal;Weight Maintenance: Understanding of the daily nutrition guidelines, which includes 25-35% calories from fat, 7% or less cal from saturated fats, less than 223m cholesterol, less than 1.5gm of sodium, & 5 or more servings of fruits and vegetables daily;Weight Loss: Understanding of general recommendations for a balanced deficit meal plan, which promotes 1-2 lb weight loss per week and includes a negative energy balance of [phone] kcal/d;Understanding recommendations for meals to include 15-35% energy as protein, 25-35% energy from fat, 35-60% energy from carbohydrates, less than 201mof dietary cholesterol, 20-35 gm of total fiber daily    Heart Failure Yes    Intervention Provide a combined exercise and nutrition program that is supplemented with education, support and counseling about heart failure. Directed toward relieving symptoms such as shortness of breath, decreased exercise tolerance, and extremity edema.    Expected Outcomes Improve functional capacity of life;Short term: Attendance in program 2-3 days a week with increased exercise capacity. Reported lower sodium intake. Reported increased fruit and vegetable intake. Reports medication compliance.;Short term: Daily weights obtained and reported for increase. Utilizing diuretic protocols set by physician.;Long term: Adoption of self-care skills and reduction of  barriers for early signs and symptoms recognition and intervention leading to self-care maintenance.    Intervention Provide education on lifestyle modifcations including regular physical activity/exercise, weight management, moderate sodium restriction and increased consumption of fresh fruit, vegetables, and low fat dairy, alcohol moderation, and smoking cessation.;Monitor prescription use compliance.    Expected Outcomes Short Term: Continued assessment and intervention until BP is < 140/9081mG in hypertensive participants. < 130/54m39m in hypertensive participants with diabetes, heart failure or chronic kidney disease.;Long Term: Maintenance of blood pressure at goal levels.    Intervention Provide education and support for participant on nutrition & aerobic/resistive exercise along with prescribed medications to achieve LDL <70mg85mL >40mg.64mExpected Outcomes Short Term: Participant states understanding of desired cholesterol values and is compliant with medications prescribed. Participant is following exercise prescription and nutrition guidelines.;Long Term: Cholesterol controlled with medications as prescribed, with individualized exercise RX and with personalized nutrition plan. Value goals: LDL < 70mg, 18m> 40 mg.           Education:Diabetes - Individual verbal and written instruction to review signs/symptoms of diabetes, desired ranges of glucose level fasting, after meals and with exercise. Acknowledge that pre and post exercise glucose checks will be done for 3 sessions at entry of program.   Education: Know Your Numbers and Risk Factors: -Group verbal and written instruction about important numbers in your health.  Discussion of what are risk factors and how they play a role in the disease process.  Review of Cholesterol, Blood Pressure, Diabetes, and BMI and the role they play in your overall health.   Core Components/Risk Factors/Patient Goals Review:    Core  Components/Risk Factors/Patient Goals at Discharge (Final Review):    ITP Comments:  ITP Comments    Row Name 12/04/19 1441  ITP Comments Virtual Visit completed. Patient informed on EP and RD appointment and 6 Minute walk test. Patient also informed of patient health questionnaires on My Chart. Patient Verbalizes understanding. Visit diagnosis can be found in [facility] 11/09/2019.              Comments: initial ITP

## 2019-12-12 ENCOUNTER — Ambulatory Visit
Admission: RE | Admit: 2019-12-12 | Discharge: 2019-12-12 | Disposition: A | Discharge status: Home / Self Care | Payer: Medicare Other | Attending: Urology | Admitting: Urology

## 2019-12-12 ENCOUNTER — Ambulatory Visit
Admission: RE | Admit: 2019-12-12 | Discharge: 2019-12-12 | Disposition: A | Discharge status: Home / Self Care | Payer: Medicare Other | Source: Ambulatory Visit | Attending: Physician Assistant | Admitting: Physician Assistant

## 2019-12-12 ENCOUNTER — Other Ambulatory Visit: Payer: Self-pay

## 2019-12-12 DIAGNOSIS — N200 Calculus of kidney: Secondary | ICD-10-CM | POA: Diagnosis present

## 2019-12-12 DIAGNOSIS — N201 Calculus of ureter: Secondary | ICD-10-CM | POA: Diagnosis present

## 2019-12-15 ENCOUNTER — Encounter: Payer: Medicare Other | Admitting: *Deleted

## 2019-12-15 ENCOUNTER — Other Ambulatory Visit: Payer: Self-pay

## 2019-12-15 ENCOUNTER — Ambulatory Visit (INDEPENDENT_AMBULATORY_CARE_PROVIDER_SITE_OTHER): Payer: Medicare Other | Admitting: Physician Assistant

## 2019-12-15 ENCOUNTER — Encounter: Payer: Self-pay | Admitting: Physician Assistant

## 2019-12-15 VITALS — BP 124/76 | HR 70 | Ht 68.0 in | Wt 266.0 lb

## 2019-12-15 DIAGNOSIS — E65 Localized adiposity: Secondary | ICD-10-CM | POA: Insufficient documentation

## 2019-12-15 DIAGNOSIS — I214 Non-ST elevation (NSTEMI) myocardial infarction: Secondary | ICD-10-CM

## 2019-12-15 DIAGNOSIS — I252 Old myocardial infarction: Secondary | ICD-10-CM | POA: Diagnosis not present

## 2019-12-15 DIAGNOSIS — N200 Calculus of kidney: Secondary | ICD-10-CM

## 2019-12-15 LAB — URINALYSIS, COMPLETE
Bilirubin, UA: NEGATIVE
Glucose, UA: NEGATIVE
Ketones, UA: NEGATIVE
Leukocytes,UA: NEGATIVE
Nitrite, UA: NEGATIVE
Protein,UA: NEGATIVE
RBC, UA: NEGATIVE
Specific Gravity, UA: 1.025 (ref 1.005–1.030)
Urobilinogen, Ur: 0.2 mg/dL (ref 0.2–1.0)
pH, UA: 5.5 (ref 5.0–7.5)

## 2019-12-15 LAB — MICROSCOPIC EXAMINATION
Bacteria, UA: NONE SEEN
Epithelial Cells (non renal): NONE SEEN /hpf (ref 0–10)
RBC, Urine: NONE SEEN /hpf (ref 0–2)

## 2019-12-15 NOTE — Progress Notes (Signed)
12/15/2019 5:32 PM   William Armstrong 09-18-1950 [id number]  CC: Chief Complaint  Patient presents with  . Nephrolithiasis    HPI: William Armstrong is a 69 y.o. male with PMH recurrent nephrolithiasis with multiple spontaneously passed stones on potassium citrate 15 mEq twice daily who presents today for follow-up of an acute stone episode.  I saw him in clinic on 11/14/2019 with reports of gross hematuria with right-sided low back pain and groin pain.  CT stone study revealed a 6 mm mid right ureteral stone.  Patient reports passing the stone approximately 2 weeks after his last clinic visit with me.  He also passed 3 small fragments.  He brings a stone and associated fragments with him to clinic today for analysis.  He reports resolution of his pain and gross hematuria since stone passage.  He denies fever, chills, nausea, and vomiting.  Notably, patient has undergone metabolic evaluation in the past for recurrent nephrolithiasis.  He states his diet and medications have not undergone significant changes since completing this.  He is currently seeing a nutritionist for management of his CAD and like of his recent heart attack.  In-office UA today pan-negative; urine microscopy with calcium oxalate cystals.  PMH: Past Medical History:  Diagnosis Date  . Anemia   . Arthritis   . BCC (basal cell carcinoma of skin)   . CHF (congestive heart failure) ([place])   . Complication of anesthesia    neck hurt for 2-3 days after general anesthesia  . COPD (chronic obstructive pulmonary disease) ([place])   . Coronary artery disease   . Depression   . Glaucoma   . Heart attack ([place])    2013  . History of kidney stones   . HTN (hypertension)   . Hyperglycemia    pt denies  . Hypertension   . Hypokalemia   . Obesity    morbid  . Sleep apnea    osa, CPAP has been dc'd after 249 lb weight loss    Surgical History: Past Surgical History:  Procedure Laterality Date  . CORONARY STENT  INTERVENTION N/A 11/10/2019   Procedure: CORONARY STENT INTERVENTION;  Surgeon: Isaias Cowman, MD;  Location: [facility];  Service: Cardiovascular;  Laterality: N/A;  . CORONARY STENT PLACEMENT  02/2012  . CYSTOSCOPY W/ RETROGRADES Left 11/18/2016   Procedure: CYSTOSCOPY WITH RETROGRADE PYELOGRAM;  Surgeon: Irine Seal, MD;  Location: [facility];  Service: Urology;  Laterality: Left;  . CYSTOSCOPY WITH STENT PLACEMENT Left 11/18/2016   Procedure: CYSTOSCOPY WITH STENT PLACEMENT;  Surgeon: Irine Seal, MD;  Location: [facility];  Service: Urology;  Laterality: Left;  . CYSTOSCOPY/URETEROSCOPY/HOLMIUM LASER/STENT PLACEMENT Left 11/29/2016   Procedure: CYSTOSCOPY/URETEROSCOPY/HOLMIUM LASER/STENT EXCHANGE;  Surgeon: Hollice Espy, MD;  Location: [facility];  Service: Urology;  Laterality: Left;  . CYSTOSCOPY/URETEROSCOPY/HOLMIUM LASER/STENT PLACEMENT Right 03/07/2017   Procedure: CYSTOSCOPY/URETEROSCOPY/HOLMIUM LASER/STENT PLACEMENT;  Surgeon: Hollice Espy, MD;  Location: [facility];  Service: Urology;  Laterality: Right;  . JOINT REPLACEMENT     right hip  . LAPAROSCOPIC GASTRIC RESTRICTIVE DUODENAL PROCEDURE (DUODENAL SWITCH)    . LAPAROSCOPIC GASTRIC SLEEVE RESECTION  08/21/2013  . LEFT HEART CATH AND CORONARY ANGIOGRAPHY N/A 11/10/2019   Procedure: LEFT HEART CATH AND CORONARY ANGIOGRAPHY;  Surgeon: Teodoro Spray, MD;  Location: [facility];  Service: Cardiovascular;  Laterality: N/A;  . skin removal surgery     removal of extra skin approx 20 lbs  . STONE EXTRACTION WITH BASKET Right 03/07/2017  Procedure: STONE EXTRACTION WITH BASKET;  Surgeon: Hollice Espy, MD;  Location: [facility];  Service: Urology;  Laterality: Right;  . TONSILLECTOMY AND ADENOIDECTOMY  1960  . TOTAL HIP ARTHROPLASTY Right 01/18/2017   Procedure: TOTAL HIP ARTHROPLASTY ANTERIOR APPROACH;  Surgeon: Hessie Knows, MD;  Location: [facility];  Service: Orthopedics;  Laterality: Right;    Home Medications:    Allergies as of 12/15/2019      Reactions   Atorvastatin Other (See Comments)   Muscle aches. Other reaction(s): Other (See Comments) Muscle aches. Muscle pain   Pregabalin Other (See Comments)   Muscle soreness Other reaction(s): Other (See Comments) Muscle soreness Severe muscle pain      Medication List       Accurate as of December 15, 2019  5:32 PM. If you have any questions, ask your nurse or doctor.        aspirin EC 81 MG tablet Take 81 mg by mouth daily.   clopidogrel 75 MG tablet Commonly known as: PLAVIX Take 1 tablet (75 mg total) by mouth daily with breakfast.   latanoprost 0.005 % ophthalmic solution Commonly known as: XALATAN Place 1 drop into both eyes at bedtime.   lisinopril 40 MG tablet Commonly known as: ZESTRIL Take 40 mg by mouth daily.   Potassium Citrate 15 MEQ (1620 MG) Tbcr Take 1 tablet by mouth 2 (two) times daily.   rosuvastatin 20 MG tablet Commonly known as: CRESTOR Take 1 tablet (20 mg total) by mouth daily.   rosuvastatin 10 MG tablet Commonly known as: CRESTOR Take 10 mg by mouth at bedtime.   sertraline 100 MG tablet Commonly known as: ZOLOFT Take 1 tablet (100 mg total) by mouth daily.   spironolactone 25 MG tablet Commonly known as: ALDACTONE Take 25 mg by mouth daily.   tamsulosin 0.4 MG Caps capsule Commonly known as: FLOMAX Take 1 capsule (0.4 mg total) by mouth daily.   Vitamin D 50 MCG (2000 UT) Caps Take 2,000 Units by mouth daily.       Allergies:  Allergies  Allergen Reactions  . Atorvastatin Other (See Comments)    Muscle aches. Other reaction(s): Other (See Comments) Muscle aches. Muscle pain  . Pregabalin Other (See Comments)    Muscle soreness Other reaction(s): Other (See Comments) Muscle soreness Severe muscle pain    Family History: Family History  Problem Relation Age of Onset  . Heart attack Mother   . Brain cancer Father   . Heart attack Sister   . Congenital heart disease Sister    . Leukemia Paternal Grandmother   . COPD Brother   . Heart disease Brother   . Prostate cancer Neg Hx   . Kidney cancer Neg Hx   . Bladder Cancer Neg Hx     Social History:   reports that he has never smoked. He has never used smokeless tobacco. He reports that he does not drink alcohol and does not use drugs.  Physical Exam: BP 124/76 (BP Location: Left Arm, Patient Position: Sitting, Cuff Size: Large)   Pulse 70   Ht 5\' 8"  (1.727 m)   Wt 266 lb (120.7 kg)   BMI 40.45 kg/m   Constitutional:  Alert and oriented, no acute distress, nontoxic appearing HEENT: [place], AT Cardiovascular: No clubbing, cyanosis, or edema Respiratory: Normal respiratory effort, no increased work of breathing Skin: No rashes, bruises or suspicious lesions Neurologic: Grossly intact, no focal deficits, moving all 4 extremities Psychiatric: Normal mood and affect  Laboratory Data: Results  for orders placed or performed in visit on 12/15/19  Microscopic Examination   Urine  Result Value Ref Range   WBC, UA 0-5 0 - 5 /hpf   RBC None seen 0 - 2 /hpf   Epithelial Cells (non renal) None seen 0 - 10 /hpf   Crystals Present (A) N/A   Crystal Type Calcium Oxalate N/A   Bacteria, UA None seen None seen/Few  Urinalysis, Complete  Result Value Ref Range   Specific Gravity, UA 1.025 1.005 - 1.030   pH, UA 5.5 5.0 - 7.5   Color, UA Yellow Yellow   Appearance Ur Clear Clear   Leukocytes,UA Negative Negative   Protein,UA Negative Negative/Trace   Glucose, UA Negative Negative   Ketones, UA Negative Negative   RBC, UA Negative Negative   Bilirubin, UA Negative Negative   Urobilinogen, Ur 0.2 0.2 - 1.0 mg/dL   Nitrite, UA Negative Negative   Microscopic Examination See below:    Pertinent Imaging: Results for orders placed during the hospital encounter of 12/12/19  DG Abd 1 View  Narrative CLINICAL DATA:  Right ureteral stone, follow-up  EXAM: ABDOMEN - 1 VIEW  COMPARISON:  CT and plain films  11/14/2019  FINDINGS: Previously seen calcification projecting over the right side of the sacrum on plain film not definitively seen on today's study. Calcified phleboliths in the pelvis. Calcifications project over the left kidney. Nonobstructive bowel gas pattern. No organomegaly or free air.  IMPRESSION: Previously seen right distal ureteral stone by plain film and CT not definitively seen on today's study.  Left nephrolithiasis.   Electronically Signed By: Rolm Baptise M.D. On: 12/13/2019 16:10  I personally reviewed the images referenced above and note clearance of the distal right ureteral stone.  Assessment & Plan:   1. Recurrent nephrolithiasis Stone passed spontaneously since his last clinic visit with me.  UA reassuring for residual fragments.  Will defer repeat metabolic work-up given no significant changes in his diet or medications since his last 24-hour urine collection.  We will plan for annual follow-up with [facility] prior, already scheduled.  Stone fragments sent for analysis today. - Urinalysis, Complete   Return if symptoms worsen or fail to improve.  Debroah Loop, PA-C  [facility] [address], [place], [place] [address] [phone]

## 2019-12-15 NOTE — Progress Notes (Signed)
Daily Session Note  Patient Details  Name: William Armstrong MRN: [id number] Date of Birth: 02-26-1951 Referring Provider:     Cardiac Rehab from 12/11/2019 in [facility]  Referring Provider Paraschos      Encounter Date: 12/15/2019  Check In:  Session Check In - 12/15/19 1110      Check-In   Supervising physician immediately available to respond to emergencies See telemetry face sheet for immediately available ER MD    Location [facility]    Staff Present Renita Papa, RN BSN;Joseph [address] [place], [place], ACSM CEP, Exercise Physiologist;Kara Eliezer Bottom, MS Exercise Physiologist    Virtual Visit No    Medication changes reported     No    Fall or balance concerns reported    No    Warm-up and Cool-down Performed on first and last piece of equipment    Resistance Training Performed Yes    VAD Patient? No    PAD/SET Patient? No      Pain Assessment   Currently in Pain? No/denies              Social History   Tobacco Use  Smoking Status Never Smoker  Smokeless Tobacco Never Used    Goals Met:  Independence with exercise equipment Exercise tolerated well No report of cardiac concerns or symptoms Strength training completed today  Goals Unmet:  Not Applicable  Comments: First full day of exercise!  Patient was oriented to gym and equipment including functions, settings, policies, and procedures.  Patient's individual exercise prescription and treatment plan were reviewed.  All starting workloads were established based on the results of the 6 minute walk test done at initial orientation visit.  The plan for exercise progression was also introduced and progression will be customized based on patient's performance and goals.    Dr. Emily Filbert is Medical Director for [place] and [facility].

## 2019-12-16 NOTE — Addendum Note (Signed)
Addended by: Donalee Citrin on: 12/16/2019 03:46 PM   Modules accepted: Orders

## 2019-12-17 ENCOUNTER — Encounter: Payer: Self-pay | Admitting: *Deleted

## 2019-12-17 ENCOUNTER — Other Ambulatory Visit: Payer: Self-pay

## 2019-12-17 ENCOUNTER — Encounter: Payer: Medicare Other | Admitting: *Deleted

## 2019-12-17 DIAGNOSIS — I252 Old myocardial infarction: Secondary | ICD-10-CM | POA: Diagnosis not present

## 2019-12-17 DIAGNOSIS — I214 Non-ST elevation (NSTEMI) myocardial infarction: Secondary | ICD-10-CM

## 2019-12-17 NOTE — Progress Notes (Signed)
Daily Session Note  Patient Details  Name: William Armstrong MRN: [id number] Date of Birth: 04-19-1951 Referring Provider:     Cardiac Rehab from 12/11/2019 in [facility]  Referring Provider Paraschos      Encounter Date: 12/17/2019  Check In:  Session Check In - 12/17/19 1134      Check-In   Supervising physician immediately available to respond to emergencies See telemetry face sheet for immediately available ER MD    Location [facility]    Staff Present Renita Papa, RN BSN;Joseph Foy [place], [place], ACSM CEP, Exercise Physiologist    Virtual Visit No    Medication changes reported     No    Fall or balance concerns reported    No    Warm-up and Cool-down Performed on first and last piece of equipment    Resistance Training Performed Yes    VAD Patient? No    PAD/SET Patient? No      Pain Assessment   Currently in Pain? No/denies              Social History   Tobacco Use  Smoking Status Never Smoker  Smokeless Tobacco Never Used    Goals Met:  Independence with exercise equipment Exercise tolerated well No report of cardiac concerns or symptoms Strength training completed today  Goals Unmet:  Not Applicable  Comments: Pt able to follow exercise prescription today without complaint.  Will continue to monitor for progression.    Dr. Emily Filbert is Medical Director for [place] and [facility].

## 2019-12-17 NOTE — Progress Notes (Signed)
Cardiac Individual Treatment Plan  Patient Details  Name: William Armstrong MRN: [id number] Date of Birth: Jan 13, 1951 Referring Provider:     Cardiac Rehab from 12/11/2019 in [facility] Cardiac and Pulmonary Rehab  Referring Provider Paraschos      Initial Encounter Date:    Cardiac Rehab from 12/11/2019 in [facility] Cardiac and Pulmonary Rehab  Date 12/11/19      Visit Diagnosis: NSTEMI (non-ST elevated myocardial infarction) [facility])  Patient's Home Medications on Admission:  Current Outpatient Medications:  .  aspirin EC 81 MG tablet, Take 81 mg by mouth daily., Disp: , Rfl:  .  Cholecalciferol (VITAMIN D) 2000 units CAPS, Take 2,000 Units by mouth daily., Disp: , Rfl:  .  clopidogrel (PLAVIX) 75 MG tablet, Take 1 tablet (75 mg total) by mouth daily with breakfast., Disp: 30 tablet, Rfl: 0 .  latanoprost (XALATAN) 0.005 % ophthalmic solution, Place 1 drop into both eyes at bedtime., Disp: , Rfl:  .  lisinopril (PRINIVIL,ZESTRIL) 40 MG tablet, Take 40 mg by mouth daily. , Disp: , Rfl:  .  Potassium Citrate 15 MEQ (1620 MG) TBCR, Take 1 tablet by mouth 2 (two) times daily., Disp: 60 tablet, Rfl: 11 .  rosuvastatin (CRESTOR) 10 MG tablet, Take 10 mg by mouth at bedtime., Disp: , Rfl:  .  rosuvastatin (CRESTOR) 20 MG tablet, Take 1 tablet (20 mg total) by mouth daily., Disp: 30 tablet, Rfl: 0 .  sertraline (ZOLOFT) 100 MG tablet, Take 1 tablet (100 mg total) by mouth daily., Disp: 90 tablet, Rfl: 3 .  spironolactone (ALDACTONE) 25 MG tablet, Take 25 mg by mouth daily., Disp: , Rfl:  .  tamsulosin (FLOMAX) 0.4 MG CAPS capsule, Take 1 capsule (0.4 mg total) by mouth daily., Disp: 30 capsule, Rfl: 0  Past Medical History: Past Medical History:  Diagnosis Date  . Anemia   . Arthritis   . BCC (basal cell carcinoma of skin)   . CHF (congestive heart failure) ([place])   . Complication of anesthesia    neck hurt for 2-3 days after general anesthesia  . COPD (chronic obstructive pulmonary disease) ([place])   .  Coronary artery disease   . Depression   . Glaucoma   . Heart attack ([place])    2013  . History of kidney stones   . HTN (hypertension)   . Hyperglycemia    pt denies  . Hypertension   . Hypokalemia   . Obesity    morbid  . Sleep apnea    osa, CPAP has been dc'd after 249 lb weight loss    Tobacco Use: Social History   Tobacco Use  Smoking Status Never Smoker  Smokeless Tobacco Never Used    Labs: Recent Review Scientist, physiological    Labs for ITP Cardiac and Pulmonary Rehab Latest Ref Rng & Units 01/21/2013 11/10/2019   Cholestrol 0 - 200 mg/dL - 70   LDLCALC 0 - 99 mg/dL - 33   HDL >40 mg/dL - 29(L)   Trlycerides <150 mg/dL - 40   Hemoglobin A1c 4.8 - 5.6 % 5.7 5.1       Exercise Target Goals: Exercise Program Goal: Individual exercise prescription set using results from initial 6 min walk test and THRR while considering  patient's activity barriers and safety.   Exercise Prescription Goal: Initial exercise prescription builds to 30-45 minutes a day of aerobic activity, 2-3 days per week.  Home exercise guidelines will be given to patient during program as part of exercise prescription that  the participant will acknowledge.   Education: Aerobic Exercise & Resistance Training: - Gives group verbal and written instruction on the various components of exercise. Focuses on aerobic and resistive training programs and the benefits of this training and how to safely progress through these programs..   Education: Exercise & Equipment Safety: - Individual verbal instruction and demonstration of equipment use and safety with use of the equipment.   Cardiac Rehab from 12/11/2019 in [facility] Cardiac and Pulmonary Rehab  Date 12/11/19  Educator AS  Instruction Review Code 1- Verbalizes Understanding      Education: Exercise Physiology & General Exercise Guidelines: - Group verbal and written instruction with models to review the exercise physiology of the cardiovascular system and  associated critical values. Provides general exercise guidelines with specific guidelines to those with heart or lung disease.    Cardiac Rehab from 12/11/2019 in [facility] Cardiac and Pulmonary Rehab  Date 12/11/19  Educator AS  Instruction Review Code 1- Verbalizes Understanding      Education: Flexibility, Balance, Mind/Body Relaxation: Provides group verbal/written instruction on the benefits of flexibility and balance training, including mind/body exercise modes such as yoga, pilates and tai chi.  Demonstration and skill practice provided.   Activity Barriers & Risk Stratification:   6 Minute Walk:  6 Minute Walk    Row Name 12/11/19 1504         6 Minute Walk   Phase Initial     Distance 1000 feet     Walk Time 6 minutes     # of Rest Breaks 0     MPH 1.9     METS 1.5     VO2 Peak 5.37     Symptoms No     Resting HR 75 bpm     Resting BP 124/64     Resting Oxygen Saturation  95 %     Exercise Oxygen Saturation  during 6 min walk 95 %     Max Ex. HR 85 bpm     Max Ex. BP 150/64     2 Minute Post BP 122/62            Oxygen Initial Assessment:   Oxygen Re-Evaluation:   Oxygen Discharge (Final Oxygen Re-Evaluation):   Initial Exercise Prescription:  Initial Exercise Prescription - 12/11/19 1500      Date of Initial Exercise RX and Referring Provider   Date 12/11/19    Referring Provider Paraschos      Treadmill   MPH 1    Grade 0    Minutes 15    METs 1.77      Recumbant Bike   Level 1    RPM 60    Minutes 15    METs 1.5      NuStep   Level 1    SPM 80    Minutes 15    METs 1.5      T5 Nustep   Level 1    SPM 80    Minutes 15    METs 1.5      Prescription Details   Frequency (times per week) 3    Duration Progress to 30 minutes of continuous aerobic without signs/symptoms of physical distress      Intensity   THRR 40-80% of Max Heartrate 104-134    Ratings of Perceived Exertion 11-13    Perceived Dyspnea 0-4      Resistance  Training   Training Prescription Yes    Weight 5 lb  Reps 10-15           Perform Capillary Blood Glucose checks as needed.  Exercise Prescription Changes:  Exercise Prescription Changes    Row Name 12/11/19 1500 12/15/19 1400           Response to Exercise   Blood Pressure (Admit) 124/64 110/60      Blood Pressure (Exercise) 150/64 124/70      Blood Pressure (Exit) 122/62 128/64      Heart Rate (Admit) 75 bpm 60 bpm      Heart Rate (Exercise) 85 bpm 88 bpm      Heart Rate (Exit) 61 bpm 57 bpm      Oxygen Saturation (Admit) 95 % --      Oxygen Saturation (Exercise) 95 % --      Rating of Perceived Exertion (Exercise) -- 13      Symptoms none none      Comments -- first full day of exercise      Duration -- Progress to 30 minutes of  aerobic without signs/symptoms of physical distress      Intensity -- THRR unchanged        Progression   Progression -- Continue to progress workloads to maintain intensity without signs/symptoms of physical distress.      Average METs -- 1.7        Resistance Training   Training Prescription -- Yes      Weight -- 5 lb      Reps -- 10-15        Interval Training   Interval Training -- No        Recumbant Bike   Level -- 1      Minutes -- 15      METs -- 1.8        NuStep   Level -- 1      Minutes -- 15      METs -- 1.6             Exercise Comments:   Exercise Goals and Review:  Exercise Goals    [place] Name 12/11/19 1510             Exercise Goals   Increase Physical Activity Yes       Intervention Provide advice, education, support and counseling about physical activity/exercise needs.;Develop an individualized exercise prescription for aerobic and resistive training based on initial evaluation findings, risk stratification, comorbidities and participant's personal goals.       Expected Outcomes Short Term: Attend rehab on a regular basis to increase amount of physical activity.;Long Term: Add in home exercise to  make exercise part of routine and to increase amount of physical activity.;Long Term: Exercising regularly at least 3-5 days a week.       Increase Strength and Stamina Yes       Intervention Provide advice, education, support and counseling about physical activity/exercise needs.;Develop an individualized exercise prescription for aerobic and resistive training based on initial evaluation findings, risk stratification, comorbidities and participant's personal goals.       Expected Outcomes Short Term: Increase workloads from initial exercise prescription for resistance, speed, and METs.;Short Term: Perform resistance training exercises routinely during rehab and add in resistance training at home;Long Term: Improve cardiorespiratory fitness, muscular endurance and strength as measured by increased METs and functional capacity (6MWT)       Able to understand and use rate of perceived exertion (RPE) scale Yes       Intervention Provide education  and explanation on how to use RPE scale       Expected Outcomes Short Term: Able to use RPE daily in rehab to express subjective intensity level;Long Term:  Able to use RPE to guide intensity level when exercising independently       Able to understand and use Dyspnea scale Yes       Intervention Provide education and explanation on how to use Dyspnea scale       Expected Outcomes Short Term: Able to use Dyspnea scale daily in rehab to express subjective sense of shortness of breath during exertion;Long Term: Able to use Dyspnea scale to guide intensity level when exercising independently       Knowledge and understanding of Target Heart Rate Range (THRR) Yes       Intervention Provide education and explanation of THRR including how the numbers were predicted and where they are located for reference       Expected Outcomes Short Term: Able to state/look up THRR;Short Term: Able to use daily as guideline for intensity in rehab;Long Term: Able to use THRR to govern  intensity when exercising independently       Able to check pulse independently Yes       Intervention Provide education and demonstration on how to check pulse in carotid and radial arteries.;Review the importance of being able to check your own pulse for safety during independent exercise       Expected Outcomes Short Term: Able to explain why pulse checking is important during independent exercise;Long Term: Able to check pulse independently and accurately       Understanding of Exercise Prescription Yes       Intervention Provide education, explanation, and written materials on patient's individual exercise prescription       Expected Outcomes Short Term: Able to explain program exercise prescription;Long Term: Able to explain home exercise prescription to exercise independently              Exercise Goals Re-Evaluation :  Exercise Goals Re-Evaluation    Row Name 12/15/19 1114             Exercise Goal Re-Evaluation   Exercise Goals Review Increase Physical Activity;Able to understand and use rate of perceived exertion (RPE) scale;Knowledge and understanding of Target Heart Rate Range (THRR);Understanding of Exercise Prescription;Increase Strength and Stamina;Able to check pulse independently       Comments Reviewed RPE and dyspnea scales, THR and program prescription with pt today.  Pt voiced understanding and was given a copy of goals to take home.       Expected Outcomes Short: Use RPE daily to regulate intensity. Long: Follow program prescription in THR.              Discharge Exercise Prescription (Final Exercise Prescription Changes):  Exercise Prescription Changes - 12/15/19 1400      Response to Exercise   Blood Pressure (Admit) 110/60    Blood Pressure (Exercise) 124/70    Blood Pressure (Exit) 128/64    Heart Rate (Admit) 60 bpm    Heart Rate (Exercise) 88 bpm    Heart Rate (Exit) 57 bpm    Rating of Perceived Exertion (Exercise) 13    Symptoms none    Comments  first full day of exercise    Duration Progress to 30 minutes of  aerobic without signs/symptoms of physical distress    Intensity THRR unchanged      Progression   Progression Continue to progress workloads to maintain  intensity without signs/symptoms of physical distress.    Average METs 1.7      Resistance Training   Training Prescription Yes    Weight 5 lb    Reps 10-15      Interval Training   Interval Training No      Recumbant Bike   Level 1    Minutes 15    METs 1.8      NuStep   Level 1    Minutes 15    METs 1.6           Nutrition:  Target Goals: Understanding of nutrition guidelines, daily intake of sodium '1500mg'$ , cholesterol '200mg'$ , calories 30% from fat and 7% or less from saturated fats, daily to have 5 or more servings of fruits and vegetables.  Education: Controlling Sodium/Reading Food Labels -Group verbal and written material supporting the discussion of sodium use in heart healthy nutrition. Review and explanation with models, verbal and written materials for utilization of the food label.   Education: General Nutrition Guidelines/Fats and Fiber: -Group instruction provided by verbal, written material, models and posters to present the general guidelines for heart healthy nutrition. Gives an explanation and review of dietary fats and fiber.   Cardiac Rehab from 12/04/2019 in [facility] Cardiac and Pulmonary Rehab  Date 12/09/19  Instruction Review Code 3- Needs Reinforcement  [need identified]      Biometrics:  Pre Biometrics - 12/11/19 1520      Pre Biometrics   Height '5\' 7"'$  (1.702 m)    Weight 258 lb 14.4 oz (117.4 kg)    BMI (Calculated) 40.54    Single Leg Stand 2.9 seconds            Nutrition Therapy Plan and Nutrition Goals:   Nutrition Assessments:  Nutrition Assessments - 12/09/19 0734      MEDFICTS Scores   Pre Score 27           MEDIFICTS Score Key:          ?70 Need to make dietary changes          40-70 Heart Healthy  Diet         ? 40 Therapeutic Level Cholesterol Diet  Nutrition Goals Re-Evaluation:   Nutrition Goals Discharge (Final Nutrition Goals Re-Evaluation):   Psychosocial: Target Goals: Acknowledge presence or absence of significant depression and/or stress, maximize coping skills, provide positive support system. Participant is able to verbalize types and ability to use techniques and skills needed for reducing stress and depression.   Education: Depression - Provides group verbal and written instruction on the correlation between heart/lung disease and depressed mood, treatment options, and the stigmas associated with seeking treatment.   Education: Sleep Hygiene -Provides group verbal and written instruction about how sleep can affect your health.  Define sleep hygiene, discuss sleep cycles and impact of sleep habits. Review good sleep hygiene tips.     Education: Stress and Anxiety: - Provides group verbal and written instruction about the health risks of elevated stress and causes of high stress.  Discuss the correlation between heart/lung disease and anxiety and treatment options. Review healthy ways to manage with stress and anxiety.    Initial Review & Psychosocial Screening:  Initial Psych Review & Screening - 12/04/19 1438      Initial Review   Current issues with Current Psychotropic Meds;None Identified      Family Dynamics   Good Support System? Yes    Comments He can look to his wife, daughter  and grandson for support.      Barriers   Psychosocial barriers to participate in program There are no identifiable barriers or psychosocial needs.;The patient should benefit from training in stress management and relaxation.      Screening Interventions   Interventions Encouraged to exercise;Provide feedback about the scores to participant;To provide support and resources with identified psychosocial needs    Expected Outcomes Short Term goal: Utilizing psychosocial  counselor, staff and physician to assist with identification of specific Stressors or current issues interfering with healing process. Setting desired goal for each stressor or current issue identified.;Long Term Goal: Stressors or current issues are controlled or eliminated.;Short Term goal: Identification and review with participant of any Quality of Life or Depression concerns found by scoring the questionnaire.;Long Term goal: The participant improves quality of Life and PHQ9 Scores as seen by post scores and/or verbalization of changes           Quality of Life Scores:   Quality of Life - 12/09/19 0734      Quality of Life   Select Quality of Life      Quality of Life Scores   Health/Function Pre 24.43 %    Socioeconomic Pre 25.48 %    Psych/Spiritual Pre 28.57 %    Family Pre 30 %    GLOBAL Pre 26.22 %          Scores of 19 and below usually indicate a poorer quality of life in these areas.  A difference of  2-3 points is a clinically meaningful difference.  A difference of 2-3 points in the total score of the Quality of Life Index has been associated with significant improvement in overall quality of life, self-image, physical symptoms, and general health in studies assessing change in quality of life.  PHQ-9: Recent Review Flowsheet Data    Depression screen [facility] 2/9 12/11/2019 10/10/2016 10/10/2016   Decreased Interest 0 0 0   Down, Depressed, Hopeless 0 0 0   PHQ - 2 Score 0 0 0   Altered sleeping 0 0 -   Tired, decreased energy 0 0 -   Change in appetite 0 0 -   Feeling bad or failure about yourself  0 0 -   Trouble concentrating 0 0 -   Moving slowly or fidgety/restless 0 0 -   Suicidal thoughts 0 0 -   PHQ-9 Score 0 0 -     Interpretation of Total Score  Total Score Depression Severity:  1-4 = Minimal depression, 5-9 = Mild depression, 10-14 = Moderate depression, 15-19 = Moderately severe depression, 20-27 = Severe depression   Psychosocial Evaluation and  Intervention:  Psychosocial Evaluation - 12/04/19 1439      Psychosocial Evaluation & Interventions   Interventions Encouraged to exercise with the program and follow exercise prescription    Comments He can look to his wife, daughter and grandson for support. Patient did the [facility] program about 7 or eight years ago. He is feeling positive about his health and is ready for Rehab.    Continue Psychosocial Services  Follow up required by staff           Psychosocial Re-Evaluation:   Psychosocial Discharge (Final Psychosocial Re-Evaluation):   Vocational Rehabilitation: Provide vocational rehab assistance to qualifying candidates.   Vocational Rehab Evaluation & Intervention:   Education: Education Goals: Education classes will be provided on a variety of topics geared toward better understanding of heart health and risk factor modification. Participant will state understanding/return  demonstration of topics presented as noted by education test scores.  Learning Barriers/Preferences:   General Cardiac Education Topics:  AED/CPR: - Group verbal and written instruction with the use of models to demonstrate the basic use of the AED with the basic ABC's of resuscitation.   Anatomy & Physiology of the Heart: - Group verbal and written instruction and models provide basic cardiac anatomy and physiology, with the coronary electrical and arterial systems. Review of Valvular disease and Heart Failure   Cardiac Procedures: - Group verbal and written instruction to review commonly prescribed medications for heart disease. Reviews the medication, class of the drug, and side effects. Includes the steps to properly store meds and maintain the prescription regimen. (beta blockers and nitrates)   Cardiac Medications I: - Group verbal and written instruction to review commonly prescribed medications for heart disease. Reviews the medication, class of the drug, and side effects. Includes  the steps to properly store meds and maintain the prescription regimen.   Cardiac Medications II: -Group verbal and written instruction to review commonly prescribed medications for heart disease. Reviews the medication, class of the drug, and side effects. (all other drug classes)    Go Sex-Intimacy & Heart Disease, Get SMART - Goal Setting: - Group verbal and written instruction through game format to discuss heart disease and the return to sexual intimacy. Provides group verbal and written material to discuss and apply goal setting through the application of the S.M.A.R.T. Method.   Other Matters of the Heart: - Provides group verbal, written materials and models to describe Stable Angina and Peripheral Artery. Includes description of the disease process and treatment options available to the cardiac patient.   Infection Prevention: - Provides verbal and written material to individual with discussion of infection control including proper hand washing and proper equipment cleaning during exercise session.   Cardiac Rehab from 12/11/2019 in [facility] Cardiac and Pulmonary Rehab  Date 12/11/19  Educator AS  Instruction Review Code 1- Verbalizes Understanding      Falls Prevention: - Provides verbal and written material to individual with discussion of falls prevention and safety.   Cardiac Rehab from 12/11/2019 in [facility] Cardiac and Pulmonary Rehab  Date 12/11/19  Educator AS  Instruction Review Code 1- Verbalizes Understanding      Other: -Provides group and verbal instruction on various topics (see comments)   Knowledge Questionnaire Score:  Knowledge Questionnaire Score - 12/09/19 0735      Knowledge Questionnaire Score   Pre Score 23/26 Education Focus: Nutrition and Exercise           Core Components/Risk Factors/Patient Goals at Admission:  Personal Goals and Risk Factors at Admission - 12/11/19 1514      Core Components/Risk Factors/Patient Goals on Admission    Weight  Management Yes;Weight Loss    Intervention Weight Management: Develop a combined nutrition and exercise program designed to reach desired caloric intake, while maintaining appropriate intake of nutrient and fiber, sodium and fats, and appropriate energy expenditure required for the weight goal.;Weight Management: Provide education and appropriate resources to help participant work on and attain dietary goals.;Weight Management/Obesity: Establish reasonable short term and long term weight goals.    Admit Weight 258 lb 14.4 oz (117.4 kg)    Goal Weight: Short Term 250 lb (113.4 kg)    Goal Weight: Long Term 245 lb (111.1 kg)    Expected Outcomes Short Term: Continue to assess and modify interventions until short term weight is achieved;Long Term: Adherence to nutrition and  physical activity/exercise program aimed toward attainment of established weight goal;Weight Maintenance: Understanding of the daily nutrition guidelines, which includes 25-35% calories from fat, 7% or less cal from saturated fats, less than '200mg'$  cholesterol, less than 1.5gm of sodium, & 5 or more servings of fruits and vegetables daily;Weight Loss: Understanding of general recommendations for a balanced deficit meal plan, which promotes 1-2 lb weight loss per week and includes a negative energy balance of [id number] kcal/d;Understanding recommendations for meals to include 15-35% energy as protein, 25-35% energy from fat, 35-60% energy from carbohydrates, less than '200mg'$  of dietary cholesterol, 20-35 gm of total fiber daily    Heart Failure Yes    Intervention Provide a combined exercise and nutrition program that is supplemented with education, support and counseling about heart failure. Directed toward relieving symptoms such as shortness of breath, decreased exercise tolerance, and extremity edema.    Expected Outcomes Improve functional capacity of life;Short term: Attendance in program 2-3 days a week with increased exercise capacity.  Reported lower sodium intake. Reported increased fruit and vegetable intake. Reports medication compliance.;Short term: Daily weights obtained and reported for increase. Utilizing diuretic protocols set by physician.;Long term: Adoption of self-care skills and reduction of barriers for early signs and symptoms recognition and intervention leading to self-care maintenance.    Intervention Provide education on lifestyle modifcations including regular physical activity/exercise, weight management, moderate sodium restriction and increased consumption of fresh fruit, vegetables, and low fat dairy, alcohol moderation, and smoking cessation.;Monitor prescription use compliance.    Expected Outcomes Short Term: Continued assessment and intervention until BP is < 140/74m HG in hypertensive participants. < 130/854mHG in hypertensive participants with diabetes, heart failure or chronic kidney disease.;Long Term: Maintenance of blood pressure at goal levels.    Intervention Provide education and support for participant on nutrition & aerobic/resistive exercise along with prescribed medications to achieve LDL '70mg'$ , HDL >'40mg'$ .    Expected Outcomes Short Term: Participant states understanding of desired cholesterol values and is compliant with medications prescribed. Participant is following exercise prescription and nutrition guidelines.;Long Term: Cholesterol controlled with medications as prescribed, with individualized exercise RX and with personalized nutrition plan. Value goals: LDL < '70mg'$ , HDL > 40 mg.           Education:Diabetes - Individual verbal and written instruction to review signs/symptoms of diabetes, desired ranges of glucose level fasting, after meals and with exercise. Acknowledge that pre and post exercise glucose checks will be done for 3 sessions at entry of program.   Education: Know Your Numbers and Risk Factors: -Group verbal and written instruction about important numbers in your  health.  Discussion of what are risk factors and how they play a role in the disease process.  Review of Cholesterol, Blood Pressure, Diabetes, and BMI and the role they play in your overall health.   Core Components/Risk Factors/Patient Goals Review:    Core Components/Risk Factors/Patient Goals at Discharge (Final Review):    ITP Comments:  ITP Comments    Row Name 12/04/19 1441 12/11/19 1519 12/15/19 1111 12/17/19 [id number]   ITP Comments Virtual Visit completed. Patient informed on EP and RD appointment and 6 Minute walk test. Patient also informed of patient health questionnaires on My Chart. Patient Verbalizes understanding. [facility] Completed 6MWT and gym orientation. Initial ITP created and sent for review to Dr. MaEmily FilbertMedical Director. First full day of exercise!  Patient was oriented to gym and equipment including functions, settings, policies,  and procedures.  Patient's individual exercise prescription and treatment plan were reviewed.  All starting workloads were established based on the results of the 6 minute walk test done at initial orientation visit.  The plan for exercise progression was also introduced and progression will be customized based on patient's performance and goals 30 Day review completed. Medical Director ITP review done, changes made as directed, and signed approval by Medical Director.  New to program           Comments:

## 2019-12-19 ENCOUNTER — Other Ambulatory Visit: Payer: Self-pay

## 2019-12-19 DIAGNOSIS — I252 Old myocardial infarction: Secondary | ICD-10-CM | POA: Diagnosis not present

## 2019-12-19 DIAGNOSIS — I214 Non-ST elevation (NSTEMI) myocardial infarction: Secondary | ICD-10-CM

## 2019-12-19 NOTE — Progress Notes (Signed)
Daily Session Note  Patient Details  Name: William Armstrong MRN: [id number] Date of Birth: April 10, 1951 Referring Provider:     Cardiac Rehab from 12/11/2019 in [facility] Cardiac and Pulmonary Rehab  Referring Provider Paraschos      Encounter Date: 12/19/2019  Check In:  Session Check In - 12/19/19 1107      Check-In   Staff Present Basilia Jumbo, RN, BSN;Jessica Dugger, MA, RCEP, CCRP, CCET;Meredith Bunkie, RN BSN;Joseph Hood RCP,RRT,BSRT    Virtual Visit No    Medication changes reported     No    Fall or balance concerns reported    No    Tobacco Cessation No Change    Warm-up and Cool-down Performed on first and last piece of equipment    Resistance Training Performed Yes    VAD Patient? No    PAD/SET Patient? No      Pain Assessment   Currently in Pain? No/denies              Social History   Tobacco Use  Smoking Status Never Smoker  Smokeless Tobacco Never Used    Goals Met:  Independence with exercise equipment Exercise tolerated well No report of cardiac concerns or symptoms  Goals Unmet:  Not Applicable  Comments: Pt able to follow exercise prescription today without complaint.  Will continue to monitor for progression.    Dr. Emily Filbert is Medical Director for [place] and [facility].

## 2019-12-22 ENCOUNTER — Encounter: Payer: Medicare Other | Admitting: *Deleted

## 2019-12-22 ENCOUNTER — Other Ambulatory Visit: Payer: Self-pay

## 2019-12-22 DIAGNOSIS — I214 Non-ST elevation (NSTEMI) myocardial infarction: Secondary | ICD-10-CM

## 2019-12-22 DIAGNOSIS — I252 Old myocardial infarction: Secondary | ICD-10-CM | POA: Diagnosis not present

## 2019-12-22 NOTE — Progress Notes (Signed)
Daily Session Note  Patient Details  Name: William Armstrong MRN: [id number] Date of Birth: 1951/01/12 Referring Provider:     Cardiac Rehab from 12/11/2019 in [facility] Cardiac and Pulmonary Rehab  Referring Provider Paraschos      Encounter Date: 12/22/2019  Check In:  Session Check In - 12/22/19 1150      Check-In   Supervising physician immediately available to respond to emergencies See telemetry face sheet for immediately available ER MD    Location [facility]-Cardiac & Pulmonary Rehab    Staff Present Nyoka Cowden, RN, BSN, Tyna Jaksch, MS Exercise Physiologist;Kelly Amedeo Plenty, BS, ACSM CEP, Exercise Physiologist    Virtual Visit No    Medication changes reported     No    Fall or balance concerns reported    No    Tobacco Cessation No Change    Warm-up and Cool-down Performed on first and last piece of equipment    Resistance Training Performed Yes    VAD Patient? No    PAD/SET Patient? No      Pain Assessment   Currently in Pain? No/denies              Social History   Tobacco Use  Smoking Status Never Smoker  Smokeless Tobacco Never Used    Goals Met:  Independence with exercise equipment Exercise tolerated well No report of cardiac concerns or symptoms Strength training completed today  Goals Unmet:  Not Applicable  Comments: Pt able to follow exercise prescription today without complaint.  Will continue to monitor for progression.    Dr. Emily Filbert is Medical Director for [place] and [facility].

## 2019-12-24 ENCOUNTER — Encounter: Payer: Medicare Other | Admitting: *Deleted

## 2019-12-24 ENCOUNTER — Other Ambulatory Visit: Payer: Self-pay

## 2019-12-24 DIAGNOSIS — I252 Old myocardial infarction: Secondary | ICD-10-CM | POA: Diagnosis not present

## 2019-12-24 DIAGNOSIS — I214 Non-ST elevation (NSTEMI) myocardial infarction: Secondary | ICD-10-CM

## 2019-12-24 NOTE — Progress Notes (Signed)
Daily Session Note  Patient Details  Name: William Armstrong MRN: [id number] Date of Birth: 07/31/1950 Referring Provider:     Cardiac Rehab from 12/11/2019 in [facility] Cardiac and Pulmonary Rehab  Referring Provider Paraschos      Encounter Date: 12/24/2019  Check In:  Session Check In - 12/24/19 1053      Check-In   Supervising physician immediately available to respond to emergencies See telemetry face sheet for immediately available ER MD    Location [facility]-Cardiac & Pulmonary Rehab    Staff Present Meredith Craven, RN BSN;Kara Langdon, MS Exercise Physiologist;Amanda Sommer, BA, ACSM CEP, Exercise Physiologist    Virtual Visit No    Medication changes reported     No    Fall or balance concerns reported    No    Warm-up and Cool-down Performed on first and last piece of equipment    Resistance Training Performed Yes    VAD Patient? No    PAD/SET Patient? No      Pain Assessment   Currently in Pain? No/denies              Social History   Tobacco Use  Smoking Status Never Smoker  Smokeless Tobacco Never Used    Goals Met:  Independence with exercise equipment Exercise tolerated well No report of cardiac concerns or symptoms Strength training completed today  Goals Unmet:  Not Applicable  Comments: Pt able to follow exercise prescription today without complaint.  Will continue to monitor for progression.    Dr. Mark Miller is Medical Director for [facility] and [facility]. 

## 2019-12-26 DIAGNOSIS — I214 Non-ST elevation (NSTEMI) myocardial infarction: Secondary | ICD-10-CM

## 2019-12-26 LAB — CALCULI, WITH PHOTOGRAPH (CLINICAL LAB)
Calcium Oxalate Monohydrate: 100 %
Weight Calculi: 138 mg

## 2019-12-26 NOTE — Progress Notes (Signed)
Completed Initial RD consultation

## 2019-12-29 ENCOUNTER — Encounter: Payer: Medicare Other | Admitting: *Deleted

## 2019-12-29 ENCOUNTER — Other Ambulatory Visit: Payer: Self-pay

## 2019-12-29 DIAGNOSIS — I214 Non-ST elevation (NSTEMI) myocardial infarction: Secondary | ICD-10-CM

## 2019-12-29 DIAGNOSIS — I252 Old myocardial infarction: Secondary | ICD-10-CM | POA: Diagnosis not present

## 2019-12-29 NOTE — Progress Notes (Signed)
Daily Session Note  Patient Details  Name: William Armstrong MRN: [id number] Date of Birth: 10/31/1950 Referring Provider:     Cardiac Rehab from 12/11/2019 in [facility] Cardiac and Pulmonary Rehab  Referring Provider Paraschos      Encounter Date: 12/29/2019  Check In:  Session Check In - 12/29/19 1112      Check-In   Supervising physician immediately available to respond to emergencies See telemetry face sheet for immediately available ER MD    Location [facility]-Cardiac & Pulmonary Rehab    Staff Present Meredith Craven, RN BSN;Joseph Hood RCP,RRT,BSRT;Kelly Hayes, BS, ACSM CEP, Exercise Physiologist;Amanda Sommer, BA, ACSM CEP, Exercise Physiologist    Virtual Visit No    Medication changes reported     No    Fall or balance concerns reported    No    Warm-up and Cool-down Performed on first and last piece of equipment    Resistance Training Performed Yes    VAD Patient? No    PAD/SET Patient? No      Pain Assessment   Currently in Pain? No/denies              Social History   Tobacco Use  Smoking Status Never Smoker  Smokeless Tobacco Never Used    Goals Met:  Independence with exercise equipment Exercise tolerated well No report of cardiac concerns or symptoms Strength training completed today  Goals Unmet:  Not Applicable  Comments: Pt able to follow exercise prescription today without complaint.  Will continue to monitor for progression.  Reviewed home exercise with pt today.  Pt plans to walking for exercise.  Reviewed THR, pulse, RPE, sign and symptoms, pulse oximetery and when to call 911 or MD.  Also discussed weather considerations and indoor options.  Pt voiced understanding.   Dr. Mark Miller is Medical Director for [facility] and [facility]. 

## 2019-12-31 ENCOUNTER — Other Ambulatory Visit: Payer: Self-pay

## 2019-12-31 ENCOUNTER — Encounter: Payer: Medicare Other | Admitting: *Deleted

## 2019-12-31 DIAGNOSIS — I214 Non-ST elevation (NSTEMI) myocardial infarction: Secondary | ICD-10-CM

## 2019-12-31 DIAGNOSIS — I252 Old myocardial infarction: Secondary | ICD-10-CM | POA: Diagnosis not present

## 2019-12-31 NOTE — Progress Notes (Signed)
Daily Session Note  Patient Details  Name: William Armstrong MRN: [id number] Date of Birth: September 19, 1950 Referring Provider:     Cardiac Rehab from 12/11/2019 in [facility] Cardiac and Pulmonary Rehab  Referring Provider Paraschos      Encounter Date: 12/31/2019  Check In:  Session Check In - 12/31/19 1032      Check-In   Supervising physician immediately available to respond to emergencies See telemetry face sheet for immediately available ER MD    Location [facility]-Cardiac & Pulmonary Rehab    Staff Present Renita Papa, RN BSN;Jessica Disney, MA, RCEP, CCRP, CCET;Melissa [place] RDN, Rowe Pavy, [place], ACSM CEP, Exercise Physiologist    Virtual Visit No    Medication changes reported     No    Fall or balance concerns reported    No    Warm-up and Cool-down Performed on first and last piece of equipment    Resistance Training Performed Yes    VAD Patient? No    PAD/SET Patient? No      Pain Assessment   Currently in Pain? No/denies              Social History   Tobacco Use  Smoking Status Never Smoker  Smokeless Tobacco Never Used    Goals Met:  Independence with exercise equipment Exercise tolerated well No report of cardiac concerns or symptoms Strength training completed today  Goals Unmet:  Not Applicable  Comments: Pt able to follow exercise prescription today without complaint.  Will continue to monitor for progression.    Dr. Emily Filbert is Medical Director for [place] and [facility].

## 2020-01-02 ENCOUNTER — Encounter: Payer: Medicare Other | Admitting: *Deleted

## 2020-01-02 ENCOUNTER — Other Ambulatory Visit: Payer: Self-pay

## 2020-01-02 DIAGNOSIS — I214 Non-ST elevation (NSTEMI) myocardial infarction: Secondary | ICD-10-CM

## 2020-01-02 DIAGNOSIS — I252 Old myocardial infarction: Secondary | ICD-10-CM | POA: Diagnosis not present

## 2020-01-02 NOTE — Progress Notes (Signed)
Daily Session Note  Patient Details  Name: William Armstrong MRN: [id number] Date of Birth: [birth year] Referring Provider:     Cardiac Rehab from 12/11/2019 in [facility] Cardiac and Pulmonary Rehab  Referring Provider Paraschos      Encounter Date: 01/02/2020  Check In:  Session Check In - 01/02/20 1101      Check-In   Supervising physician immediately available to respond to emergencies See telemetry face sheet for immediately available ER MD    Location [facility]-Cardiac & Pulmonary Rehab    Staff Present Renita Papa, RN BSN;Joseph [address] [place], [place], [place], CCRP, CCET    Virtual Visit No    Medication changes reported     No    Fall or balance concerns reported    No    Warm-up and Cool-down Performed on first and last piece of equipment    Resistance Training Performed Yes    VAD Patient? No    PAD/SET Patient? No      Pain Assessment   Currently in Pain? No/denies              Social History   Tobacco Use  Smoking Status Never Smoker  Smokeless Tobacco Never Used    Goals Met:  Independence with exercise equipment Exercise tolerated well No report of cardiac concerns or symptoms Strength training completed today  Goals Unmet:  Not Applicable  Comments: Pt able to follow exercise prescription today without complaint.  Will continue to monitor for progression.    Dr. Emily Filbert is Medical Director for [place] and [facility].

## 2020-01-05 ENCOUNTER — Other Ambulatory Visit: Payer: Self-pay

## 2020-01-05 ENCOUNTER — Encounter: Payer: Medicare Other | Attending: Cardiology | Admitting: *Deleted

## 2020-01-05 DIAGNOSIS — Z955 Presence of coronary angioplasty implant and graft: Secondary | ICD-10-CM | POA: Diagnosis not present

## 2020-01-05 DIAGNOSIS — I214 Non-ST elevation (NSTEMI) myocardial infarction: Secondary | ICD-10-CM

## 2020-01-05 DIAGNOSIS — I252 Old myocardial infarction: Secondary | ICD-10-CM | POA: Diagnosis not present

## 2020-01-05 NOTE — Progress Notes (Signed)
Daily Session Note  Patient Details  Name: William Armstrong MRN: [id number] Date of Birth: [birth year] Referring Provider:     Cardiac Rehab from 12/11/2019 in [facility] Cardiac and Pulmonary Rehab  Referring Provider Paraschos      Encounter Date: 01/05/2020  Check In:  Session Check In - 01/05/20 1119      Check-In   Supervising physician immediately available to respond to emergencies See telemetry face sheet for immediately available ER MD    Location [facility]-Cardiac & Pulmonary Rehab    Staff Present Renita Papa, RN BSN;Joseph Lou Miner, [place] Exercise Physiologist;Kelly Amedeo Plenty, [place], ACSM CEP, Exercise Physiologist    Virtual Visit No    Medication changes reported     No    Fall or balance concerns reported    No    Warm-up and Cool-down Performed on first and last piece of equipment    Resistance Training Performed Yes    VAD Patient? No    PAD/SET Patient? No      Pain Assessment   Currently in Pain? No/denies              Social History   Tobacco Use  Smoking Status Never Smoker  Smokeless Tobacco Never Used    Goals Met:  Independence with exercise equipment Exercise tolerated well No report of cardiac concerns or symptoms Strength training completed today  Goals Unmet:  Not Applicable  Comments: Pt able to follow exercise prescription today without complaint.  Will continue to monitor for progression.    Dr. Emily Filbert is Medical Director for [place] and [facility].

## 2020-01-07 ENCOUNTER — Encounter: Payer: Medicare Other | Admitting: *Deleted

## 2020-01-07 ENCOUNTER — Other Ambulatory Visit: Payer: Self-pay

## 2020-01-07 DIAGNOSIS — I252 Old myocardial infarction: Secondary | ICD-10-CM | POA: Diagnosis not present

## 2020-01-07 DIAGNOSIS — I214 Non-ST elevation (NSTEMI) myocardial infarction: Secondary | ICD-10-CM

## 2020-01-07 NOTE — Progress Notes (Signed)
Daily Session Note  Patient Details  Name: William Armstrong MRN: [id number] Date of Birth: 07-13-1950 Referring Provider:     Cardiac Rehab from 12/11/2019 in [facility] Cardiac and Pulmonary Rehab  Referring Provider Paraschos      Encounter Date: 01/07/2020  Check In:  Session Check In - 01/07/20 1159      Check-In   Supervising physician immediately available to respond to emergencies See telemetry face sheet for immediately available ER MD    Location [facility]-Cardiac & Pulmonary Rehab    Staff Present Renita Papa, RN BSN;Joseph Hood RCP,RRT,BSRT;Heath Lark, RN, BSN, CCRP;Melissa [place] RDN, Rowe Pavy, BA, ACSM CEP, Exercise Physiologist    Virtual Visit No    Medication changes reported     No    Fall or balance concerns reported    No    Warm-up and Cool-down Performed on first and last piece of equipment    Resistance Training Performed Yes    VAD Patient? No    PAD/SET Patient? No      Pain Assessment   Currently in Pain? No/denies              Social History   Tobacco Use  Smoking Status Never Smoker  Smokeless Tobacco Never Used    Goals Met:  Independence with exercise equipment Exercise tolerated well No report of cardiac concerns or symptoms Strength training completed today  Goals Unmet:  Not Applicable  Comments: Pt able to follow exercise prescription today without complaint.  Will continue to monitor for progression.    Dr. Emily Filbert is Medical Director for [place] and [facility].

## 2020-01-09 ENCOUNTER — Encounter: Payer: Medicare Other | Admitting: *Deleted

## 2020-01-09 ENCOUNTER — Other Ambulatory Visit: Payer: Self-pay

## 2020-01-09 DIAGNOSIS — I214 Non-ST elevation (NSTEMI) myocardial infarction: Secondary | ICD-10-CM

## 2020-01-09 DIAGNOSIS — I252 Old myocardial infarction: Secondary | ICD-10-CM | POA: Diagnosis not present

## 2020-01-09 NOTE — Progress Notes (Signed)
Daily Session Note  Patient Details  Name: William Armstrong MRN: [id number] Date of Birth: 12/01/1950 Referring Provider:     Cardiac Rehab from 12/11/2019 in [facility] Cardiac and Pulmonary Rehab  Referring Provider Paraschos      Encounter Date: 01/09/2020  Check In:  Session Check In - 01/09/20 1121      Check-In   Supervising physician immediately available to respond to emergencies See telemetry face sheet for immediately available ER MD    Location [facility]-Cardiac & Pulmonary Rehab    Staff Present Meredith Craven, RN BSN;Joseph Hood RCP,RRT,BSRT;Krista Spencer, RN BSN;Kara Langdon, MS Exercise Physiologist    Virtual Visit No    Medication changes reported     No    Fall or balance concerns reported    No    Warm-up and Cool-down Performed on first and last piece of equipment    Resistance Training Performed Yes    VAD Patient? No    PAD/SET Patient? No      Pain Assessment   Currently in Pain? No/denies              Social History   Tobacco Use  Smoking Status Never Smoker  Smokeless Tobacco Never Used    Goals Met:  Independence with exercise equipment Exercise tolerated well No report of cardiac concerns or symptoms Strength training completed today  Goals Unmet:  Not Applicable  Comments: Pt able to follow exercise prescription today without complaint.  Will continue to monitor for progression.    Dr. Mark Miller is Medical Director for [facility] and [facility]. 

## 2020-01-12 ENCOUNTER — Other Ambulatory Visit: Payer: Self-pay

## 2020-01-12 ENCOUNTER — Encounter: Payer: Medicare Other | Admitting: *Deleted

## 2020-01-12 DIAGNOSIS — I214 Non-ST elevation (NSTEMI) myocardial infarction: Secondary | ICD-10-CM

## 2020-01-12 DIAGNOSIS — I252 Old myocardial infarction: Secondary | ICD-10-CM | POA: Diagnosis not present

## 2020-01-12 NOTE — Progress Notes (Signed)
Daily Session Note  Patient Details  Name: William Armstrong MRN: [id number] Date of Birth: 1951/05/21 Referring Provider:     Cardiac Rehab from 12/11/2019 in [facility] Cardiac and Pulmonary Rehab  Referring Provider Paraschos      Encounter Date: 01/12/2020  Check In:  Session Check In - 01/12/20 1100      Check-In   Supervising physician immediately available to respond to emergencies See telemetry face sheet for immediately available ER MD    Location [facility]-Cardiac & Pulmonary Rehab    Staff Present Renita Papa, RN BSN;Joseph Lou Miner, [place] Exercise Physiologist;Kelly Amedeo Plenty, [place], ACSM CEP, Exercise Physiologist    Virtual Visit No    Medication changes reported     No    Fall or balance concerns reported    No    Warm-up and Cool-down Performed on first and last piece of equipment    Resistance Training Performed Yes    VAD Patient? No    PAD/SET Patient? No      Pain Assessment   Currently in Pain? No/denies              Social History   Tobacco Use  Smoking Status Never Smoker  Smokeless Tobacco Never Used    Goals Met:  Independence with exercise equipment Exercise tolerated well No report of cardiac concerns or symptoms Strength training completed today  Goals Unmet:  Not Applicable  Comments: Pt able to follow exercise prescription today without complaint.  Will continue to monitor for progression.    Dr. Emily Filbert is Medical Director for [place] and [facility].

## 2020-01-14 ENCOUNTER — Other Ambulatory Visit: Payer: Self-pay

## 2020-01-14 ENCOUNTER — Encounter: Payer: Self-pay | Admitting: *Deleted

## 2020-01-14 ENCOUNTER — Encounter: Payer: Medicare Other | Admitting: *Deleted

## 2020-01-14 DIAGNOSIS — I214 Non-ST elevation (NSTEMI) myocardial infarction: Secondary | ICD-10-CM

## 2020-01-14 DIAGNOSIS — I252 Old myocardial infarction: Secondary | ICD-10-CM | POA: Diagnosis not present

## 2020-01-14 NOTE — Progress Notes (Signed)
Daily Session Note  Patient Details  Name: William Armstrong MRN: [id number] Date of Birth: 1951/02/27 Referring Provider:     Cardiac Rehab from 12/11/2019 in [facility] Cardiac and Pulmonary Rehab  Referring Provider Paraschos      Encounter Date: 01/14/2020  Check In:  Session Check In - 01/14/20 1110      Check-In   Supervising physician immediately available to respond to emergencies See telemetry face sheet for immediately available ER MD    Location [facility]-Cardiac & Pulmonary Rehab    Staff Present Renita Papa, RN BSN;Joseph Hood RCP,RRT,BSRT;Melissa Palisade RDN, Rowe Pavy, BA, ACSM CEP, Exercise Physiologist    Virtual Visit No    Medication changes reported     No    Fall or balance concerns reported    No    Warm-up and Cool-down Performed on first and last piece of equipment    Resistance Training Performed Yes    VAD Patient? No    PAD/SET Patient? No      Pain Assessment   Currently in Pain? No/denies              Social History   Tobacco Use  Smoking Status Never Smoker  Smokeless Tobacco Never Used    Goals Met:  Independence with exercise equipment Exercise tolerated well No report of cardiac concerns or symptoms Strength training completed today  Goals Unmet:  Not Applicable  Comments: Pt able to follow exercise prescription today without complaint.  Will continue to monitor for progression.    Dr. Emily Filbert is Medical Director for [place] and [facility].

## 2020-01-14 NOTE — Progress Notes (Signed)
Cardiac Individual Treatment Plan  Patient Details  Name: William Armstrong MRN: [id number] Date of Birth: 06/20/1950 Referring Provider:     Cardiac Rehab from 12/11/2019 in [facility] Cardiac and Pulmonary Rehab  Referring Provider Paraschos      Initial Encounter Date:    Cardiac Rehab from 12/11/2019 in [facility] Cardiac and Pulmonary Rehab  Date 12/11/19      Visit Diagnosis: NSTEMI (non-ST elevated myocardial infarction) [facility])  Patient's Home Medications on Admission:  Current Outpatient Medications:  .  aspirin EC 81 MG tablet, Take 81 mg by mouth daily., Disp: , Rfl:  .  Cholecalciferol (VITAMIN D) 2000 units CAPS, Take 2,000 Units by mouth daily., Disp: , Rfl:  .  clopidogrel (PLAVIX) 75 MG tablet, Take 1 tablet (75 mg total) by mouth daily with breakfast., Disp: 30 tablet, Rfl: 0 .  latanoprost (XALATAN) 0.005 % ophthalmic solution, Place 1 drop into both eyes at bedtime., Disp: , Rfl:  .  lisinopril (PRINIVIL,ZESTRIL) 40 MG tablet, Take 40 mg by mouth daily. , Disp: , Rfl:  .  Potassium Citrate 15 MEQ (1620 MG) TBCR, Take 1 tablet by mouth 2 (two) times daily., Disp: 60 tablet, Rfl: 11 .  rosuvastatin (CRESTOR) 10 MG tablet, Take 10 mg by mouth at bedtime., Disp: , Rfl:  .  rosuvastatin (CRESTOR) 20 MG tablet, Take 1 tablet (20 mg total) by mouth daily., Disp: 30 tablet, Rfl: 0 .  sertraline (ZOLOFT) 100 MG tablet, Take 1 tablet (100 mg total) by mouth daily., Disp: 90 tablet, Rfl: 3 .  spironolactone (ALDACTONE) 25 MG tablet, Take 25 mg by mouth daily., Disp: , Rfl:  .  tamsulosin (FLOMAX) 0.4 MG CAPS capsule, Take 1 capsule (0.4 mg total) by mouth daily., Disp: 30 capsule, Rfl: 0  Past Medical History: Past Medical History:  Diagnosis Date  . Anemia   . Arthritis   . BCC (basal cell carcinoma of skin)   . CHF (congestive heart failure) ([place])   . Complication of anesthesia    neck hurt for 2-3 days after general anesthesia  . COPD (chronic obstructive pulmonary disease) ([place])   .  Coronary artery disease   . Depression   . Glaucoma   . Heart attack ([place])    2013  . History of kidney stones   . HTN (hypertension)   . Hyperglycemia    pt denies  . Hypertension   . Hypokalemia   . Obesity    morbid  . Sleep apnea    osa, CPAP has been dc'd after 249 lb weight loss    Tobacco Use: Social History   Tobacco Use  Smoking Status Never Smoker  Smokeless Tobacco Never Used    Labs: Recent Review Scientist, physiological    Labs for ITP Cardiac and Pulmonary Rehab Latest Ref Rng & Units 01/21/2013 11/10/2019   Cholestrol 0 - 200 mg/dL - 70   LDLCALC 0 - 99 mg/dL - 33   HDL >40 mg/dL - 29(L)   Trlycerides <150 mg/dL - 40   Hemoglobin A1c 4.8 - 5.6 % 5.7 5.1       Exercise Target Goals: Exercise Program Goal: Individual exercise prescription set using results from initial 6 min walk test and THRR while considering  patient's activity barriers and safety.   Exercise Prescription Goal: Initial exercise prescription builds to 30-45 minutes a day of aerobic activity, 2-3 days per week.  Home exercise guidelines will be given to patient during program as part of exercise prescription that  the participant will acknowledge.   Education: Aerobic Exercise & Resistance Training: - Gives group verbal and written instruction on the various components of exercise. Focuses on aerobic and resistive training programs and the benefits of this training and how to safely progress through these programs..   Education: Exercise & Equipment Safety: - Individual verbal instruction and demonstration of equipment use and safety with use of the equipment.   Cardiac Rehab from 01/07/2020 in [facility] Cardiac and Pulmonary Rehab  Date 12/11/19  Educator AS  Instruction Review Code 1- Verbalizes Understanding      Education: Exercise Physiology & General Exercise Guidelines: - Group verbal and written instruction with models to review the exercise physiology of the cardiovascular system and  associated critical values. Provides general exercise guidelines with specific guidelines to those with heart or lung disease.    Cardiac Rehab from 01/07/2020 in [facility] Cardiac and Pulmonary Rehab  Date 12/11/19  Educator AS  Instruction Review Code 1- Verbalizes Understanding      Education: Flexibility, Balance, Mind/Body Relaxation: Provides group verbal/written instruction on the benefits of flexibility and balance training, including mind/body exercise modes such as yoga, pilates and tai chi.  Demonstration and skill practice provided.   Activity Barriers & Risk Stratification:   6 Minute Walk:  6 Minute Walk    Row Name 12/11/19 1504         6 Minute Walk   Phase Initial     Distance 1000 feet     Walk Time 6 minutes     # of Rest Breaks 0     MPH 1.9     METS 1.5     VO2 Peak 5.37     Symptoms No     Resting HR 75 bpm     Resting BP 124/64     Resting Oxygen Saturation  95 %     Exercise Oxygen Saturation  during 6 min walk 95 %     Max Ex. HR 85 bpm     Max Ex. BP 150/64     2 Minute Post BP 122/62            Oxygen Initial Assessment:   Oxygen Re-Evaluation:   Oxygen Discharge (Final Oxygen Re-Evaluation):   Initial Exercise Prescription:  Initial Exercise Prescription - 12/11/19 1500      Date of Initial Exercise RX and Referring Provider   Date 12/11/19    Referring Provider Paraschos      Treadmill   MPH 1    Grade 0    Minutes 15    METs 1.77      Recumbant Bike   Level 1    RPM 60    Minutes 15    METs 1.5      NuStep   Level 1    SPM 80    Minutes 15    METs 1.5      T5 Nustep   Level 1    SPM 80    Minutes 15    METs 1.5      Prescription Details   Frequency (times per week) 3    Duration Progress to 30 minutes of continuous aerobic without signs/symptoms of physical distress      Intensity   THRR 40-80% of Max Heartrate 104-134    Ratings of Perceived Exertion 11-13    Perceived Dyspnea 0-4      Resistance  Training   Training Prescription Yes    Weight 5 lb  Reps 10-15           Perform Capillary Blood Glucose checks as needed.  Exercise Prescription Changes:  Exercise Prescription Changes    Row Name 12/11/19 1500 12/15/19 1400 12/29/19 1100 12/31/19 1200       Response to Exercise   Blood Pressure (Admit) 124/64 110/60 -- 122/62    Blood Pressure (Exercise) 150/64 124/70 -- 124/70    Blood Pressure (Exit) 122/62 128/64 -- 126/70    Heart Rate (Admit) 75 bpm 60 bpm -- 57 bpm    Heart Rate (Exercise) 85 bpm 88 bpm -- 99 bpm    Heart Rate (Exit) 61 bpm 57 bpm -- 68 bpm    Oxygen Saturation (Admit) 95 % -- -- --    Oxygen Saturation (Exercise) 95 % -- -- --    Rating of Perceived Exertion (Exercise) -- 13 -- 12    Symptoms none none -- none    Comments -- first full day of exercise -- --    Duration -- Progress to 30 minutes of  aerobic without signs/symptoms of physical distress -- Continue with 30 min of aerobic exercise without signs/symptoms of physical distress.    Intensity -- THRR unchanged -- THRR unchanged      Progression   Progression -- Continue to progress workloads to maintain intensity without signs/symptoms of physical distress. -- Continue to progress workloads to maintain intensity without signs/symptoms of physical distress.    Average METs -- 1.7 -- 2.35      Resistance Training   Training Prescription -- Yes -- Yes    Weight -- 5 lb -- 5 lb    Reps -- 10-15 -- 10-15      Interval Training   Interval Training -- No -- No      Recumbant Bike   Level -- 1 -- 1    RPM -- -- -- 60    Minutes -- 15 -- 15    METs -- 1.8 -- 2.66      NuStep   Level -- 1 -- 4    SPM -- -- -- 80    Minutes -- 15 -- 15    METs -- 1.6 -- 2.1      Home Exercise Plan   Plans to continue exercise at -- -- Home (comment)  walking --    Frequency -- -- Add 2 additional days to program exercise sessions. --    Initial Home Exercises Provided -- -- 12/29/19 --            Exercise Comments:   Exercise Goals and Review:  Exercise Goals    Row Name 12/11/19 1510             Exercise Goals   Increase Physical Activity Yes       Intervention Provide advice, education, support and counseling about physical activity/exercise needs.;Develop an individualized exercise prescription for aerobic and resistive training based on initial evaluation findings, risk stratification, comorbidities and participant's personal goals.       Expected Outcomes Short Term: Attend rehab on a regular basis to increase amount of physical activity.;Long Term: Add in home exercise to make exercise part of routine and to increase amount of physical activity.;Long Term: Exercising regularly at least 3-5 days a week.       Increase Strength and Stamina Yes       Intervention Provide advice, education, support and counseling about physical activity/exercise needs.;Develop an individualized exercise prescription for aerobic and resistive training based  on initial evaluation findings, risk stratification, comorbidities and participant's personal goals.       Expected Outcomes Short Term: Increase workloads from initial exercise prescription for resistance, speed, and METs.;Short Term: Perform resistance training exercises routinely during rehab and add in resistance training at home;Long Term: Improve cardiorespiratory fitness, muscular endurance and strength as measured by increased METs and functional capacity (6MWT)       Able to understand and use rate of perceived exertion (RPE) scale Yes       Intervention Provide education and explanation on how to use RPE scale       Expected Outcomes Short Term: Able to use RPE daily in rehab to express subjective intensity level;Long Term:  Able to use RPE to guide intensity level when exercising independently       Able to understand and use Dyspnea scale Yes       Intervention Provide education and explanation on how to use Dyspnea scale        Expected Outcomes Short Term: Able to use Dyspnea scale daily in rehab to express subjective sense of shortness of breath during exertion;Long Term: Able to use Dyspnea scale to guide intensity level when exercising independently       Knowledge and understanding of Target Heart Rate Range (THRR) Yes       Intervention Provide education and explanation of THRR including how the numbers were predicted and where they are located for reference       Expected Outcomes Short Term: Able to state/look up THRR;Short Term: Able to use daily as guideline for intensity in rehab;Long Term: Able to use THRR to govern intensity when exercising independently       Able to check pulse independently Yes       Intervention Provide education and demonstration on how to check pulse in carotid and radial arteries.;Review the importance of being able to check your own pulse for safety during independent exercise       Expected Outcomes Short Term: Able to explain why pulse checking is important during independent exercise;Long Term: Able to check pulse independently and accurately       Understanding of Exercise Prescription Yes       Intervention Provide education, explanation, and written materials on patient's individual exercise prescription       Expected Outcomes Short Term: Able to explain program exercise prescription;Long Term: Able to explain home exercise prescription to exercise independently              Exercise Goals Re-Evaluation :  Exercise Goals Re-Evaluation    Row Name 12/15/19 1114 12/29/19 1115           Exercise Goal Re-Evaluation   Exercise Goals Review Increase Physical Activity;Able to understand and use rate of perceived exertion (RPE) scale;Knowledge and understanding of Target Heart Rate Range (THRR);Understanding of Exercise Prescription;Increase Strength and Stamina;Able to check pulse independently Increase Physical Activity;Able to understand and use rate of perceived exertion (RPE)  scale;Knowledge and understanding of Target Heart Rate Range (THRR);Understanding of Exercise Prescription;Increase Strength and Stamina;Able to check pulse independently      Comments Reviewed RPE and dyspnea scales, THR and program prescription with pt today.  Pt voiced understanding and was given a copy of goals to take home. Clair Gulling is off to a good start in rehab. He is already walking for about 10-15 min at home.  We talked about extending that time to 30 min. Reviewed home exercise with pt today.  Pt plans to  walking for exercise.  Reviewed THR, pulse, RPE, sign and symptoms, pulse oximetery and when to call 911 or MD.  Also discussed weather considerations and indoor options.  Pt voiced understanding.      Expected Outcomes Short: Use RPE daily to regulate intensity. Long: Follow program prescription in THR. Short: Extend time of walking Long: Continue to improve stamina.             Discharge Exercise Prescription (Final Exercise Prescription Changes):  Exercise Prescription Changes - 12/31/19 1200      Response to Exercise   Blood Pressure (Admit) 122/62    Blood Pressure (Exercise) 124/70    Blood Pressure (Exit) 126/70    Heart Rate (Admit) 57 bpm    Heart Rate (Exercise) 99 bpm    Heart Rate (Exit) 68 bpm    Rating of Perceived Exertion (Exercise) 12    Symptoms none    Duration Continue with 30 min of aerobic exercise without signs/symptoms of physical distress.    Intensity THRR unchanged      Progression   Progression Continue to progress workloads to maintain intensity without signs/symptoms of physical distress.    Average METs 2.35      Resistance Training   Training Prescription Yes    Weight 5 lb    Reps 10-15      Interval Training   Interval Training No      Recumbant Bike   Level 1    RPM 60    Minutes 15    METs 2.66      NuStep   Level 4    SPM 80    Minutes 15    METs 2.1           Nutrition:  Target Goals: Understanding of nutrition  guidelines, daily intake of sodium '1500mg'$ , cholesterol '200mg'$ , calories 30% from fat and 7% or less from saturated fats, daily to have 5 or more servings of fruits and vegetables.  Education: Controlling Sodium/Reading Food Labels -Group verbal and written material supporting the discussion of sodium use in heart healthy nutrition. Review and explanation with models, verbal and written materials for utilization of the food label.   Education: General Nutrition Guidelines/Fats and Fiber: -Group instruction provided by verbal, written material, models and posters to present the general guidelines for heart healthy nutrition. Gives an explanation and review of dietary fats and fiber.   Cardiac Rehab from 01/07/2020 in [facility] Cardiac and Pulmonary Rehab  Date 12/17/19  Educator jh  Instruction Review Code 1- Verbalizes Understanding      Biometrics:  Pre Biometrics - 12/11/19 1520      Pre Biometrics   Height '5\' 7"'$  (1.702 m)    Weight 258 lb 14.4 oz (117.4 kg)    BMI (Calculated) 40.54    Single Leg Stand 2.9 seconds            Nutrition Therapy Plan and Nutrition Goals:  Nutrition Therapy & Goals - 12/26/19 0730      Nutrition Therapy   Diet Heart healthy, low sodium    Drug/Food Interactions Statins/Certain Fruits    Protein (specify units) 95-100g    Fiber 30 grams    Whole Grain Foods 3 servings    Saturated Fats 12 max. grams    Fruits and Vegetables 5 servings/day    Sodium 1.5 grams      Personal Nutrition Goals   Nutrition Goal LT: Strengthen heart, strengthen legs    Comments Retired so no current schedule.  Couple pieces of bacon with eggs (no oil). L: chicken (grilled or boiled) alone or with sandwich, beans or small potato with some kind of vegetables. S: [place] rolled up . Drinks water or crystal lite. D: hamburger steak or ham - boiled or baked or fish. beans or small potato with some kind of vegetables. Seldom eats fried foods, rarely has soft drinks. Has his own  garden. doesn't east much red meat. Discussed heart healthy eating. Pt would like to review paperwork.      Intervention Plan   Intervention Prescribe, educate and counsel regarding individualized specific dietary modifications aiming towards targeted core components such as weight, hypertension, lipid management, diabetes, heart failure and other comorbidities.;Nutrition handout(s) given to patient.    Expected Outcomes Short Term Goal: Understand basic principles of dietary content, such as calories, fat, sodium, cholesterol and nutrients.;Short Term Goal: A plan has been developed with personal nutrition goals set during dietitian appointment.;Long Term Goal: Adherence to prescribed nutrition plan.           Nutrition Assessments:  Nutrition Assessments - 12/09/19 0734      MEDFICTS Scores   Pre Score 27           MEDIFICTS Score Key:          ?70 Need to make dietary changes          40-70 Heart Healthy Diet         ? 40 Therapeutic Level Cholesterol Diet  Nutrition Goals Re-Evaluation:   Nutrition Goals Discharge (Final Nutrition Goals Re-Evaluation):   Psychosocial: Target Goals: Acknowledge presence or absence of significant depression and/or stress, maximize coping skills, provide positive support system. Participant is able to verbalize types and ability to use techniques and skills needed for reducing stress and depression.   Education: Depression - Provides group verbal and written instruction on the correlation between heart/lung disease and depressed mood, treatment options, and the stigmas associated with seeking treatment.   Cardiac Rehab from 01/07/2020 in [facility] Cardiac and Pulmonary Rehab  Date 01/07/20  Educator [facility]  Instruction Review Code 1- [employer] Understanding      Education: Sleep Hygiene -Provides group verbal and written instruction about how sleep can affect your health.  Define sleep hygiene, discuss sleep cycles and impact of sleep habits.  Review good sleep hygiene tips.     Education: Stress and Anxiety: - Provides group verbal and written instruction about the health risks of elevated stress and causes of high stress.  Discuss the correlation between heart/lung disease and anxiety and treatment options. Review healthy ways to manage with stress and anxiety.   Cardiac Rehab from 01/07/2020 in [facility] Cardiac and Pulmonary Rehab  Date 01/07/20  Educator [facility]  Instruction Review Code 1- Verbalizes Understanding       Initial Review & Psychosocial Screening:  Initial Psych Review & Screening - 12/04/19 1438      Initial Review   Current issues with Current Psychotropic Meds;None Identified      Family Dynamics   Good Support System? Yes    Comments He can look to his wife, daughter and grandson for support.      Barriers   Psychosocial barriers to participate in program There are no identifiable barriers or psychosocial needs.;The patient should benefit from training in stress management and relaxation.      Screening Interventions   Interventions Encouraged to exercise;Provide feedback about the scores to participant;To provide support and resources with identified psychosocial needs    Expected Outcomes  Short Term goal: Utilizing psychosocial counselor, staff and physician to assist with identification of specific Stressors or current issues interfering with healing process. Setting desired goal for each stressor or current issue identified.;Long Term Goal: Stressors or current issues are controlled or eliminated.;Short Term goal: Identification and review with participant of any Quality of Life or Depression concerns found by scoring the questionnaire.;Long Term goal: The participant improves quality of Life and PHQ9 Scores as seen by post scores and/or verbalization of changes           Quality of Life Scores:   Quality of Life - 12/09/19 0734      Quality of Life   Select Quality of Life      Quality of Life Scores    Health/Function Pre 24.43 %    Socioeconomic Pre 25.48 %    Psych/Spiritual Pre 28.57 %    Family Pre 30 %    GLOBAL Pre 26.22 %          Scores of 19 and below usually indicate a poorer quality of life in these areas.  A difference of  2-3 points is a clinically meaningful difference.  A difference of 2-3 points in the total score of the Quality of Life Index has been associated with significant improvement in overall quality of life, self-image, physical symptoms, and general health in studies assessing change in quality of life.  PHQ-9: Recent Review Flowsheet Data    Depression screen [facility] 2/9 12/11/2019 10/10/2016 10/10/2016   Decreased Interest 0 0 0   Down, Depressed, Hopeless 0 0 0   PHQ - 2 Score 0 0 0   Altered sleeping 0 0 -   Tired, decreased energy 0 0 -   Change in appetite 0 0 -   Feeling bad or failure about yourself  0 0 -   Trouble concentrating 0 0 -   Moving slowly or fidgety/restless 0 0 -   Suicidal thoughts 0 0 -   PHQ-9 Score 0 0 -     Interpretation of Total Score  Total Score Depression Severity:  1-4 = Minimal depression, 5-9 = Mild depression, 10-14 = Moderate depression, 15-19 = Moderately severe depression, 20-27 = Severe depression   Psychosocial Evaluation and Intervention:  Psychosocial Evaluation - 12/04/19 1439      Psychosocial Evaluation & Interventions   Interventions Encouraged to exercise with the program and follow exercise prescription    Comments He can look to his wife, daughter and grandson for support. Patient did the [facility] program about 7 or eight years ago. He is feeling positive about his health and is ready for Rehab.    Continue Psychosocial Services  Follow up required by staff           Psychosocial Re-Evaluation:  Psychosocial Re-Evaluation    [place] Name 12/29/19 1119             Psychosocial Re-Evaluation   Current issues with Current Psychotropic Meds       Comments Clair Gulling is doing well in rehab.  His wife is  joining Chief of Staff while he is coming to rehab.  He denies any current symptims of depression or anxiety.  He is feeling good.  He enjoys coming to rehab and to get out and about.  They have a new dog next door and his is not a fan.  They are getting better with each other.       Expected Outcomes Short: Continue to exercise to build stamina Long:  Continue to stay positive.       Interventions Encouraged to attend Cardiac Rehabilitation for the exercise       Continue Psychosocial Services  Follow up required by staff              Psychosocial Discharge (Final Psychosocial Re-Evaluation):  Psychosocial Re-Evaluation - 12/29/19 1119      Psychosocial Re-Evaluation   Current issues with Current Psychotropic Meds    Comments Rosanne Ashing is doing well in rehab.  His wife is joining Geophysicist/field seismologist while he is coming to rehab.  He denies any current symptims of depression or anxiety.  He is feeling good.  He enjoys coming to rehab and to get out and about.  They have a new dog next door and his is not a fan.  They are getting better with each other.    Expected Outcomes Short: Continue to exercise to build stamina Long: Continue to stay positive.    Interventions Encouraged to attend Cardiac Rehabilitation for the exercise    Continue Psychosocial Services  Follow up required by staff           Vocational Rehabilitation: Provide vocational rehab assistance to qualifying candidates.   Vocational Rehab Evaluation & Intervention:   Education: Education Goals: Education classes will be provided on a variety of topics geared toward better understanding of heart health and risk factor modification. Participant will state understanding/return demonstration of topics presented as noted by education test scores.  Learning Barriers/Preferences:   General Cardiac Education Topics:  AED/CPR: - Group verbal and written instruction with the use of models to demonstrate the basic use of the AED with  the basic ABC's of resuscitation.   Anatomy & Physiology of the Heart: - Group verbal and written instruction and models provide basic cardiac anatomy and physiology, with the coronary electrical and arterial systems. Review of Valvular disease and Heart Failure   Cardiac Procedures: - Group verbal and written instruction to review commonly prescribed medications for heart disease. Reviews the medication, class of the drug, and side effects. Includes the steps to properly store meds and maintain the prescription regimen. (beta blockers and nitrates)   Cardiac Medications I: - Group verbal and written instruction to review commonly prescribed medications for heart disease. Reviews the medication, class of the drug, and side effects. Includes the steps to properly store meds and maintain the prescription regimen.   Cardiac Rehab from 01/07/2020 in [facility] Cardiac and Pulmonary Rehab  Date 12/31/19  Educator SB  Instruction Review Code 1- Verbalizes Understanding      Cardiac Medications II: -Group verbal and written instruction to review commonly prescribed medications for heart disease. Reviews the medication, class of the drug, and side effects. (all other drug classes)    Go Sex-Intimacy & Heart Disease, Get SMART - Goal Setting: - Group verbal and written instruction through game format to discuss heart disease and the return to sexual intimacy. Provides group verbal and written material to discuss and apply goal setting through the application of the S.M.A.R.T. Method.   Other Matters of the Heart: - Provides group verbal, written materials and models to describe Stable Angina and Peripheral Artery. Includes description of the disease process and treatment options available to the cardiac patient.   Infection Prevention: - Provides verbal and written material to individual with discussion of infection control including proper hand washing and proper equipment cleaning during exercise  session.   Cardiac Rehab from 01/07/2020 in [facility] Cardiac and Pulmonary Rehab  Date  12/11/19  Educator AS  Instruction Review Code 1- Verbalizes Understanding      Falls Prevention: - Provides verbal and written material to individual with discussion of falls prevention and safety.   Cardiac Rehab from 01/07/2020 in [facility] Cardiac and Pulmonary Rehab  Date 12/11/19  Educator AS  Instruction Review Code 1- Verbalizes Understanding      Other: -Provides group and verbal instruction on various topics (see comments)   Knowledge Questionnaire Score:  Knowledge Questionnaire Score - 12/09/19 0735      Knowledge Questionnaire Score   Pre Score 23/26 Education Focus: Nutrition and Exercise           Core Components/Risk Factors/Patient Goals at Admission:  Personal Goals and Risk Factors at Admission - 12/11/19 1514      Core Components/Risk Factors/Patient Goals on Admission    Weight Management Yes;Weight Loss    Intervention Weight Management: Develop a combined nutrition and exercise program designed to reach desired caloric intake, while maintaining appropriate intake of nutrient and fiber, sodium and fats, and appropriate energy expenditure required for the weight goal.;Weight Management: Provide education and appropriate resources to help participant work on and attain dietary goals.;Weight Management/Obesity: Establish reasonable short term and long term weight goals.    Admit Weight 258 lb 14.4 oz (117.4 kg)    Goal Weight: Short Term 250 lb (113.4 kg)    Goal Weight: Long Term 245 lb (111.1 kg)    Expected Outcomes Short Term: Continue to assess and modify interventions until short term weight is achieved;Long Term: Adherence to nutrition and physical activity/exercise program aimed toward attainment of established weight goal;Weight Maintenance: Understanding of the daily nutrition guidelines, which includes 25-35% calories from fat, 7% or less cal from saturated fats, less than  '200mg'$  cholesterol, less than 1.5gm of sodium, & 5 or more servings of fruits and vegetables daily;Weight Loss: Understanding of general recommendations for a balanced deficit meal plan, which promotes 1-2 lb weight loss per week and includes a negative energy balance of [phone] kcal/d;Understanding recommendations for meals to include 15-35% energy as protein, 25-35% energy from fat, 35-60% energy from carbohydrates, less than '200mg'$  of dietary cholesterol, 20-35 gm of total fiber daily    Heart Failure Yes    Intervention Provide a combined exercise and nutrition program that is supplemented with education, support and counseling about heart failure. Directed toward relieving symptoms such as shortness of breath, decreased exercise tolerance, and extremity edema.    Expected Outcomes Improve functional capacity of life;Short term: Attendance in program 2-3 days a week with increased exercise capacity. Reported lower sodium intake. Reported increased fruit and vegetable intake. Reports medication compliance.;Short term: Daily weights obtained and reported for increase. Utilizing diuretic protocols set by physician.;Long term: Adoption of self-care skills and reduction of barriers for early signs and symptoms recognition and intervention leading to self-care maintenance.    Intervention Provide education on lifestyle modifcations including regular physical activity/exercise, weight management, moderate sodium restriction and increased consumption of fresh fruit, vegetables, and low fat dairy, alcohol moderation, and smoking cessation.;Monitor prescription use compliance.    Expected Outcomes Short Term: Continued assessment and intervention until BP is < 140/62m HG in hypertensive participants. < 130/831mHG in hypertensive participants with diabetes, heart failure or chronic kidney disease.;Long Term: Maintenance of blood pressure at goal levels.    Intervention Provide education and support for participant  on nutrition & aerobic/resistive exercise along with prescribed medications to achieve LDL '70mg'$ , HDL >'40mg'$ .    Expected Outcomes  Short Term: Participant states understanding of desired cholesterol values and is compliant with medications prescribed. Participant is following exercise prescription and nutrition guidelines.;Long Term: Cholesterol controlled with medications as prescribed, with individualized exercise RX and with personalized nutrition plan. Value goals: LDL < '70mg'$ , HDL > 40 mg.           Education:Diabetes - Individual verbal and written instruction to review signs/symptoms of diabetes, desired ranges of glucose level fasting, after meals and with exercise. Acknowledge that pre and post exercise glucose checks will be done for 3 sessions at entry of program.   Education: Know Your Numbers and Risk Factors: -Group verbal and written instruction about important numbers in your health.  Discussion of what are risk factors and how they play a role in the disease process.  Review of Cholesterol, Blood Pressure, Diabetes, and BMI and the role they play in your overall health.   Core Components/Risk Factors/Patient Goals Review:   Goals and Risk Factor Review    Row Name 12/29/19 1121             Core Components/Risk Factors/Patient Goals Review   Personal Goals Review Weight Management/Obesity;Heart Failure;Hypertension;Lipids       Review Clair Gulling is doing well in rehab.  He is off to a good start.  His weight has been stable for the most part.  He denies any heart failure symptoms.  We reviewed heart failure zones and handout given.  His blood pressures have been good and he checks them regularly at home.  He is doing well on his medications as well.       Expected Outcomes Short: Review heart failure handout  Long; Continue to work on weight loss and monitoring risk factors.              Core Components/Risk Factors/Patient Goals at Discharge (Final Review):   Goals and Risk  Factor Review - 12/29/19 1121      Core Components/Risk Factors/Patient Goals Review   Personal Goals Review Weight Management/Obesity;Heart Failure;Hypertension;Lipids    Review Clair Gulling is doing well in rehab.  He is off to a good start.  His weight has been stable for the most part.  He denies any heart failure symptoms.  We reviewed heart failure zones and handout given.  His blood pressures have been good and he checks them regularly at home.  He is doing well on his medications as well.    Expected Outcomes Short: Review heart failure handout  Long; Continue to work on weight loss and monitoring risk factors.           ITP Comments:  ITP Comments    Row Name 12/04/19 1441 12/11/19 1519 12/15/19 1111 12/17/19 0608 01/14/20 0551   ITP Comments Virtual Visit completed. Patient informed on EP and RD appointment and 6 Minute walk test. Patient also informed of patient health questionnaires on My Chart. Patient Verbalizes understanding. Visit diagnosis can be found in [facility] 11/09/2019. Completed 6MWT and gym orientation. Initial ITP created and sent for review to Dr. Emily Filbert, Medical Director. First full day of exercise!  Patient was oriented to gym and equipment including functions, settings, policies, and procedures.  Patient's individual exercise prescription and treatment plan were reviewed.  All starting workloads were established based on the results of the 6 minute walk test done at initial orientation visit.  The plan for exercise progression was also introduced and progression will be customized based on patient's performance and goals 30 Day review completed. Medical Director  ITP review done, changes made as directed, and signed approval by Medical Director.  New to program 30 Day review completed. Medical Director ITP review done, changes made as directed, and signed approval by Medical Director.          Comments:

## 2020-01-16 ENCOUNTER — Encounter: Payer: Medicare Other | Admitting: *Deleted

## 2020-01-16 ENCOUNTER — Other Ambulatory Visit: Payer: Self-pay

## 2020-01-16 DIAGNOSIS — I252 Old myocardial infarction: Secondary | ICD-10-CM | POA: Diagnosis not present

## 2020-01-16 DIAGNOSIS — I214 Non-ST elevation (NSTEMI) myocardial infarction: Secondary | ICD-10-CM

## 2020-01-16 NOTE — Progress Notes (Signed)
Daily Session Note  Patient Details  Name: William Armstrong MRN: [id number] Date of Birth: May 21, 1951 Referring Provider:     Cardiac Rehab from 12/11/2019 in [facility] Cardiac and Pulmonary Rehab  Referring Provider Paraschos      Encounter Date: 01/16/2020  Check In:  Session Check In - 01/16/20 1118      Check-In   Supervising physician immediately available to respond to emergencies See telemetry face sheet for immediately available ER MD    Location [facility]-Cardiac & Pulmonary Rehab    Staff Present Renita Papa, RN BSN;Joseph [address] [place], [place], [place], CCRP, CCET    Virtual Visit No    Medication changes reported     No    Fall or balance concerns reported    No    Warm-up and Cool-down Performed on first and last piece of equipment    Resistance Training Performed Yes    VAD Patient? No    PAD/SET Patient? No      Pain Assessment   Currently in Pain? No/denies              Social History   Tobacco Use  Smoking Status Never Smoker  Smokeless Tobacco Never Used    Goals Met:  Independence with exercise equipment Exercise tolerated well No report of cardiac concerns or symptoms Strength training completed today  Goals Unmet:  Not Applicable  Comments: Pt able to follow exercise prescription today without complaint.  Will continue to monitor for progression.    Dr. Emily Filbert is Medical Director for [place] and [facility].

## 2020-01-19 ENCOUNTER — Encounter: Payer: Medicare Other | Admitting: *Deleted

## 2020-01-19 ENCOUNTER — Other Ambulatory Visit: Payer: Self-pay

## 2020-01-19 DIAGNOSIS — I214 Non-ST elevation (NSTEMI) myocardial infarction: Secondary | ICD-10-CM

## 2020-01-19 DIAGNOSIS — I252 Old myocardial infarction: Secondary | ICD-10-CM | POA: Diagnosis not present

## 2020-01-19 NOTE — Progress Notes (Signed)
Daily Session Note  Patient Details  Name: William Armstrong MRN: [id number] Date of Birth: Apr 22, 1951 Referring Provider:     Cardiac Rehab from 12/11/2019 in [facility] Cardiac and Pulmonary Rehab  Referring Provider Paraschos      Encounter Date: 01/19/2020  Check In:  Session Check In - 01/19/20 1203      Check-In   Supervising physician immediately available to respond to emergencies See telemetry face sheet for immediately available ER MD    Location [facility]-Cardiac & Pulmonary Rehab    Staff Present Heath Lark, RN, BSN, Laveda Norman, BS, ACSM CEP, Exercise Physiologist;Joseph Lou [place], [place] Exercise Physiologist    Virtual Visit No    Medication changes reported     No    Fall or balance concerns reported    No    Warm-up and Cool-down Performed on first and last piece of equipment    Resistance Training Performed Yes    VAD Patient? No    PAD/SET Patient? No      Pain Assessment   Currently in Pain? No/denies              Social History   Tobacco Use  Smoking Status Never Smoker  Smokeless Tobacco Never Used    Goals Met:  Independence with exercise equipment Exercise tolerated well No report of cardiac concerns or symptoms  Goals Unmet:  Not Applicable  Comments: Pt able to follow exercise prescription today without complaint.  Will continue to monitor for progression.    Dr. Emily Filbert is Medical Director for [place] and [facility].

## 2020-01-21 ENCOUNTER — Other Ambulatory Visit: Payer: Self-pay

## 2020-01-21 ENCOUNTER — Encounter: Payer: Medicare Other | Admitting: *Deleted

## 2020-01-21 DIAGNOSIS — I214 Non-ST elevation (NSTEMI) myocardial infarction: Secondary | ICD-10-CM

## 2020-01-21 DIAGNOSIS — I252 Old myocardial infarction: Secondary | ICD-10-CM | POA: Diagnosis not present

## 2020-01-21 NOTE — Progress Notes (Signed)
Daily Session Note  Patient Details  Name: William Armstrong MRN: [id number] Date of Birth: December 11, 1950 Referring Provider:     Cardiac Rehab from 12/11/2019 in [facility] Cardiac and Pulmonary Rehab  Referring Provider Paraschos      Encounter Date: 01/21/2020  Check In:  Session Check In - 01/21/20 1131      Check-In   Supervising physician immediately available to respond to emergencies See telemetry face sheet for immediately available ER MD    Location [facility]-Cardiac & Pulmonary Rehab    Staff Present Heath Lark, RN, BSN, CCRP;Jessica [place]ton, MA, RCEP, CCRP, CCET;Joseph [employer], [place], ACSM CEP, Exercise Physiologist    Virtual Visit No    Medication changes reported     No    Fall or balance concerns reported    No    Warm-up and Cool-down Performed on first and last piece of equipment    Resistance Training Performed Yes    VAD Patient? No    PAD/SET Patient? No      Pain Assessment   Currently in Pain? No/denies              Social History   Tobacco Use  Smoking Status Never Smoker  Smokeless Tobacco Never Used    Goals Met:  Independence with exercise equipment Exercise tolerated well No report of cardiac concerns or symptoms  Goals Unmet:  Not Applicable  Comments: Pt able to follow exercise prescription today without complaint.  Will continue to monitor for progression.    Dr. Emily Filbert is Medical Director for [place] and [facility].

## 2020-01-23 ENCOUNTER — Other Ambulatory Visit: Payer: Self-pay

## 2020-01-23 ENCOUNTER — Encounter: Payer: Medicare Other | Admitting: *Deleted

## 2020-01-23 DIAGNOSIS — I214 Non-ST elevation (NSTEMI) myocardial infarction: Secondary | ICD-10-CM

## 2020-01-23 DIAGNOSIS — I252 Old myocardial infarction: Secondary | ICD-10-CM | POA: Diagnosis not present

## 2020-01-23 NOTE — Progress Notes (Signed)
Daily Session Note  Patient Details  Name: William Armstrong MRN: [id number] Date of Birth: Feb 17, 1951 Referring Provider:     Cardiac Rehab from 12/11/2019 in [facility] Cardiac and Pulmonary Rehab  Referring Provider Paraschos      Encounter Date: 01/23/2020  Check In:  Session Check In - 01/23/20 1115      Check-In   Supervising physician immediately available to respond to emergencies See telemetry face sheet for immediately available ER MD    Location [facility]-Cardiac & Pulmonary Rehab    Staff Present Heath Lark, RN, BSN, CCRP;Jessica [place], MA, RCEP, CCRP, CCET;Joseph [employer], [place], ACSM CEP, Exercise Physiologist    Virtual Visit No    Medication changes reported     No    Fall or balance concerns reported    No    Warm-up and Cool-down Performed on first and last piece of equipment    Resistance Training Performed Yes    VAD Patient? No    PAD/SET Patient? No      Pain Assessment   Currently in Pain? No/denies              Social History   Tobacco Use  Smoking Status Never Smoker  Smokeless Tobacco Never Used    Goals Met:  Proper associated with RPD/PD & O2 Sat Exercise tolerated well No report of cardiac concerns or symptoms  Goals Unmet:  Not Applicable  Comments: Pt able to follow exercise prescription today without complaint.  Will continue to monitor for progression.    Dr. Emily Filbert is Medical Director for [place] and [facility].

## 2020-01-26 ENCOUNTER — Other Ambulatory Visit: Payer: Self-pay

## 2020-01-26 ENCOUNTER — Encounter: Payer: Medicare Other | Admitting: *Deleted

## 2020-01-26 DIAGNOSIS — I214 Non-ST elevation (NSTEMI) myocardial infarction: Secondary | ICD-10-CM

## 2020-01-26 DIAGNOSIS — I252 Old myocardial infarction: Secondary | ICD-10-CM | POA: Diagnosis not present

## 2020-01-26 NOTE — Progress Notes (Signed)
Daily Session Note  Patient Details  Name: William Armstrong MRN: [id number] Date of Birth: 06-03-1951 Referring Provider:     Cardiac Rehab from 12/11/2019 in [facility] Cardiac and Pulmonary Rehab  Referring Provider Paraschos      Encounter Date: 01/26/2020  Check In:  Session Check In - 01/26/20 1119      Check-In   Supervising physician immediately available to respond to emergencies See telemetry face sheet for immediately available ER MD    Location [facility]-Cardiac & Pulmonary Rehab    Staff Present Heath Lark, RN, BSN, Laveda Norman, BS, ACSM CEP, Exercise Physiologist;Jessica [place], [place], RCEP, CCRP, CCET    Virtual Visit No    Medication changes reported     No    Fall or balance concerns reported    No    Warm-up and Cool-down Performed on first and last piece of equipment    Resistance Training Performed Yes    VAD Patient? No    PAD/SET Patient? No      Pain Assessment   Currently in Pain? No/denies              Social History   Tobacco Use  Smoking Status Never Smoker  Smokeless Tobacco Never Used    Goals Met:  Independence with exercise equipment Exercise tolerated well No report of cardiac concerns or symptoms  Goals Unmet:  Not Applicable  Comments: Pt able to follow exercise prescription today without complaint.  Will continue to monitor for progression.    Dr. Emily Filbert is Medical Director for [place] and [facility].

## 2020-01-28 ENCOUNTER — Encounter: Payer: Medicare Other | Admitting: *Deleted

## 2020-01-28 ENCOUNTER — Other Ambulatory Visit: Payer: Self-pay

## 2020-01-28 DIAGNOSIS — I214 Non-ST elevation (NSTEMI) myocardial infarction: Secondary | ICD-10-CM

## 2020-01-28 DIAGNOSIS — I252 Old myocardial infarction: Secondary | ICD-10-CM | POA: Diagnosis not present

## 2020-01-28 NOTE — Progress Notes (Signed)
Daily Session Note  Patient Details  Name: William Armstrong MRN: [id number] Date of Birth: 1950/12/02 Referring Provider:     Cardiac Rehab from 12/11/2019 in [facility] Cardiac and Pulmonary Rehab  Referring Provider Paraschos      Encounter Date: 01/28/2020  Check In:  Session Check In - 01/28/20 Parks      Check-In   Supervising physician immediately available to respond to emergencies See telemetry face sheet for immediately available ER MD    Location [facility]-Cardiac & Pulmonary Rehab    Staff Present Renita Papa, RN Margurite Auerbach, MS Exercise Physiologist;Susanne Bice, RN, BSN, CCRP;Amanda Sommer, BA, ACSM CEP, Exercise Physiologist;Jessica [place], MA, RCEP, CCRP, CCET    Virtual Visit No    Medication changes reported     No    Fall or balance concerns reported    No    Warm-up and Cool-down Performed on first and last piece of equipment    Resistance Training Performed Yes    VAD Patient? No    PAD/SET Patient? No      Pain Assessment   Currently in Pain? No/denies              Social History   Tobacco Use  Smoking Status Never Smoker  Smokeless Tobacco Never Used    Goals Met:  Independence with exercise equipment Exercise tolerated well No report of cardiac concerns or symptoms Strength training completed today  Goals Unmet:  Not Applicable  Comments: Pt able to follow exercise prescription today without complaint.  Will continue to monitor for progression.    Dr. Emily Filbert is Medical Director for [place] and [facility].

## 2020-01-30 ENCOUNTER — Other Ambulatory Visit: Payer: Self-pay

## 2020-01-30 DIAGNOSIS — I252 Old myocardial infarction: Secondary | ICD-10-CM | POA: Diagnosis not present

## 2020-01-30 DIAGNOSIS — I214 Non-ST elevation (NSTEMI) myocardial infarction: Secondary | ICD-10-CM

## 2020-01-30 NOTE — Progress Notes (Signed)
Daily Session Note  Patient Details  Name: William Armstrong MRN: [id number] Date of Birth: Dec 20, 1950 Referring Provider:     Cardiac Rehab from 12/11/2019 in [facility] Cardiac and Pulmonary Rehab  Referring Provider Paraschos      Encounter Date: 01/30/2020  Check In:  Session Check In - 01/30/20 1136      Check-In   Supervising physician immediately available to respond to emergencies See telemetry face sheet for immediately available ER MD    Location [facility]-Cardiac & Pulmonary Rehab    Staff Present Carson Myrtle, BS, RRT, CPFT;Meredith Sherryll Burger, RN BSN;Sarinity Dicicco RN, BSN;Jessica Luan Pulling, MA, RCEP, CCRP, CCET    Virtual Visit No    Medication changes reported     No    Fall or balance concerns reported    No    Warm-up and Cool-down Performed on first and last piece of equipment    Resistance Training Performed Yes    VAD Patient? No    PAD/SET Patient? No      Pain Assessment   Currently in Pain? No/denies              Social History   Tobacco Use  Smoking Status Never Smoker  Smokeless Tobacco Never Used    Goals Met:  Proper associated with RPD/PD & O2 Sat Independence with exercise equipment Exercise tolerated well No report of cardiac concerns or symptoms Strength training completed today  Goals Unmet:  Not Applicable  Comments: Pt able to follow exercise prescription today without complaint.  Will continue to monitor for progression.   Dr. Emily Filbert is Medical Director for [place] and [facility].

## 2020-02-02 ENCOUNTER — Other Ambulatory Visit: Payer: Self-pay

## 2020-02-02 ENCOUNTER — Encounter: Payer: Medicare Other | Admitting: *Deleted

## 2020-02-02 DIAGNOSIS — I252 Old myocardial infarction: Secondary | ICD-10-CM | POA: Diagnosis not present

## 2020-02-02 DIAGNOSIS — I214 Non-ST elevation (NSTEMI) myocardial infarction: Secondary | ICD-10-CM

## 2020-02-02 NOTE — Progress Notes (Signed)
Daily Session Note  Patient Details  Name: William Armstrong MRN: [id number] Date of Birth: November 10, 1950 Referring Provider:     Cardiac Rehab from 12/11/2019 in [facility] Cardiac and Pulmonary Rehab  Referring Provider Paraschos      Encounter Date: 02/02/2020  Check In:  Session Check In - 02/02/20 1059      Check-In   Supervising physician immediately available to respond to emergencies See telemetry face sheet for immediately available ER MD    Location [facility]-Cardiac & Pulmonary Rehab    Staff Present Renita Papa, RN Margurite Auerbach, MS Exercise Physiologist;Kelly Amedeo Plenty, BS, ACSM CEP, Exercise Physiologist;Joseph Tessie Fass RCP,RRT,BSRT    Virtual Visit No    Medication changes reported     No    Fall or balance concerns reported    No    Warm-up and Cool-down Performed on first and last piece of equipment    Resistance Training Performed Yes    VAD Patient? No    PAD/SET Patient? No      Pain Assessment   Currently in Pain? No/denies              Social History   Tobacco Use  Smoking Status Never Smoker  Smokeless Tobacco Never Used    Goals Met:  Independence with exercise equipment Exercise tolerated well No report of cardiac concerns or symptoms Strength training completed today  Goals Unmet:  Not Applicable  Comments: Pt able to follow exercise prescription today without complaint.  Will continue to monitor for progression.    Dr. Emily Filbert is Medical Director for [place] and [facility].

## 2020-02-04 ENCOUNTER — Encounter: Payer: Medicare Other | Attending: Cardiology | Admitting: *Deleted

## 2020-02-04 ENCOUNTER — Other Ambulatory Visit: Payer: Self-pay

## 2020-02-04 DIAGNOSIS — Z955 Presence of coronary angioplasty implant and graft: Secondary | ICD-10-CM | POA: Diagnosis not present

## 2020-02-04 DIAGNOSIS — I214 Non-ST elevation (NSTEMI) myocardial infarction: Secondary | ICD-10-CM

## 2020-02-04 DIAGNOSIS — I252 Old myocardial infarction: Secondary | ICD-10-CM | POA: Diagnosis present

## 2020-02-04 NOTE — Progress Notes (Signed)
Daily Session Note  Patient Details  Name: William Armstrong MRN: [id number] Date of Birth: 02/21/1951 Referring Provider:     Cardiac Rehab from 12/11/2019 in [facility] Cardiac and Pulmonary Rehab  Referring Provider Paraschos      Encounter Date: 02/04/2020  Check In:  Session Check In - 02/04/20 1144      Check-In   Supervising physician immediately available to respond to emergencies See telemetry face sheet for immediately available ER MD    Location [facility]-Cardiac & Pulmonary Rehab    Staff Present Renita Papa, RN BSN;Joseph [address] [place], [place], [place], CCRP, Marylynn Pearson, [place] Exercise Physiologist    Virtual Visit No    Medication changes reported     No    Fall or balance concerns reported    No    Warm-up and Cool-down Performed on first and last piece of equipment    Resistance Training Performed Yes    VAD Patient? No    PAD/SET Patient? No      Pain Assessment   Currently in Pain? No/denies              Social History   Tobacco Use  Smoking Status Never Smoker  Smokeless Tobacco Never Used    Goals Met:  Independence with exercise equipment Exercise tolerated well No report of cardiac concerns or symptoms Strength training completed today  Goals Unmet:  Not Applicable  Comments: Pt able to follow exercise prescription today without complaint.  Will continue to monitor for progression.    Dr. Emily Filbert is Medical Director for [place] and [facility].

## 2020-02-06 ENCOUNTER — Other Ambulatory Visit: Payer: Self-pay

## 2020-02-06 VITALS — Ht 67.0 in | Wt 261.4 lb

## 2020-02-06 DIAGNOSIS — I214 Non-ST elevation (NSTEMI) myocardial infarction: Secondary | ICD-10-CM

## 2020-02-06 DIAGNOSIS — I252 Old myocardial infarction: Secondary | ICD-10-CM | POA: Diagnosis not present

## 2020-02-06 NOTE — Progress Notes (Signed)
Daily Session Note  Patient Details  Name: William Armstrong MRN: [id number] Date of Birth: [birth year] Referring Provider:     Cardiac Rehab from 12/11/2019 in [facility] Cardiac and Pulmonary Rehab  Referring Provider Paraschos      Encounter Date: 02/06/2020  Check In:  Session Check In - 02/06/20 1122      Check-In   Supervising physician immediately available to respond to emergencies See telemetry face sheet for immediately available ER MD    Location [facility]-Cardiac & Pulmonary Rehab    Staff Present Renita Papa, RN BSN;Pavneet Markwood RN, BSN;Jessica [place], MA, RCEP, CCRP, CCET;Melissa Fox RDN, LDN    Virtual Visit No    Medication changes reported     No    Fall or balance concerns reported    No    Warm-up and Cool-down Performed on first and last piece of equipment    Resistance Training Performed Yes    VAD Patient? No    PAD/SET Patient? No      Pain Assessment   Currently in Pain? No/denies              Social History   Tobacco Use  Smoking Status Never Smoker  Smokeless Tobacco Never Used    Goals Met:  Proper associated with RPD/PD & O2 Sat Independence with exercise equipment Exercise tolerated well No report of cardiac concerns or symptoms Strength training completed today  Goals Unmet:  Not Applicable  Comments: Pt able to follow exercise prescription today without complaint.  Will continue to monitor for progression.   Dr. Emily Filbert is Medical Director for [place] and [facility].

## 2020-02-11 ENCOUNTER — Encounter: Payer: Medicare Other | Admitting: *Deleted

## 2020-02-11 ENCOUNTER — Encounter: Payer: Self-pay | Admitting: *Deleted

## 2020-02-11 ENCOUNTER — Other Ambulatory Visit: Payer: Self-pay

## 2020-02-11 DIAGNOSIS — I252 Old myocardial infarction: Secondary | ICD-10-CM | POA: Diagnosis not present

## 2020-02-11 DIAGNOSIS — I214 Non-ST elevation (NSTEMI) myocardial infarction: Secondary | ICD-10-CM

## 2020-02-11 NOTE — Progress Notes (Signed)
Daily Session Note  Patient Details  Name: William Armstrong MRN: [id number] Date of Birth: 1951/04/07 Referring Provider:     Cardiac Rehab from 12/11/2019 in [facility] Cardiac and Pulmonary Rehab  Referring Provider Paraschos      Encounter Date: 02/11/2020  Check In:  Session Check In - 02/11/20 1107      Check-In   Supervising physician immediately available to respond to emergencies See telemetry face sheet for immediately available ER MD    Location [facility]-Cardiac & Pulmonary Rehab    Staff Present Renita Papa, RN BSN;Joseph Lou Miner, [place] Exercise Physiologist;Amanda Oletta Darter, [place], ACSM CEP, Exercise Physiologist;Melissa Caiola RDN, LDN    Virtual Visit No    Medication changes reported     No    Fall or balance concerns reported    No    Warm-up and Cool-down Performed on first and last piece of equipment    Resistance Training Performed Yes    VAD Patient? No    PAD/SET Patient? No      Pain Assessment   Currently in Pain? No/denies              Social History   Tobacco Use  Smoking Status Never Smoker  Smokeless Tobacco Never Used    Goals Met:  Independence with exercise equipment Exercise tolerated well No report of cardiac concerns or symptoms Strength training completed today  Goals Unmet:  Not Applicable  Comments: Pt able to follow exercise prescription today without complaint.  Will continue to monitor for progression.    Dr. Emily Filbert is Medical Director for [place] and [facility].

## 2020-02-11 NOTE — Progress Notes (Signed)
Cardiac Individual Treatment Plan  Patient Details  Name: William Armstrong MRN: [id number] Date of Birth: 1951-03-31 Referring Provider:     Cardiac Rehab from 12/11/2019 in [facility] Cardiac and Pulmonary Rehab  Referring Provider Paraschos      Initial Encounter Date:    Cardiac Rehab from 12/11/2019 in [facility] Cardiac and Pulmonary Rehab  Date 12/11/19      Visit Diagnosis: NSTEMI (non-ST elevated myocardial infarction) [place])  Patient's Home Medications on Admission:  Current Outpatient Medications:    aspirin EC 81 MG tablet, Take 81 mg by mouth daily., Disp: , Rfl:    Cholecalciferol (VITAMIN D) 2000 units CAPS, Take 2,000 Units by mouth daily., Disp: , Rfl:    clopidogrel (PLAVIX) 75 MG tablet, Take 1 tablet (75 mg total) by mouth daily with breakfast., Disp: 30 tablet, Rfl: 0   latanoprost (XALATAN) 0.005 % ophthalmic solution, Place 1 drop into both eyes at bedtime., Disp: , Rfl:    lisinopril (PRINIVIL,ZESTRIL) 40 MG tablet, Take 40 mg by mouth daily. , Disp: , Rfl:    Potassium Citrate 15 MEQ (1620 MG) TBCR, Take 1 tablet by mouth 2 (two) times daily., Disp: 60 tablet, Rfl: 11   rosuvastatin (CRESTOR) 10 MG tablet, Take 10 mg by mouth at bedtime., Disp: , Rfl:    rosuvastatin (CRESTOR) 20 MG tablet, Take 1 tablet (20 mg total) by mouth daily., Disp: 30 tablet, Rfl: 0   sertraline (ZOLOFT) 100 MG tablet, Take 1 tablet (100 mg total) by mouth daily., Disp: 90 tablet, Rfl: 3   spironolactone (ALDACTONE) 25 MG tablet, Take 25 mg by mouth daily., Disp: , Rfl:    tamsulosin (FLOMAX) 0.4 MG CAPS capsule, Take 1 capsule (0.4 mg total) by mouth daily., Disp: 30 capsule, Rfl: 0  Past Medical History: Past Medical History:  Diagnosis Date   Anemia    Arthritis    BCC (basal cell carcinoma of skin)    CHF (congestive heart failure) (HCC)    Complication of anesthesia    neck hurt for 2-3 days after general anesthesia   COPD (chronic obstructive pulmonary disease) (HCC)     Coronary artery disease    Depression    Glaucoma    Heart attack ([place])    2013   History of kidney stones    HTN (hypertension)    Hyperglycemia    pt denies   Hypertension    Hypokalemia    Obesity    morbid   Sleep apnea    osa, CPAP has been dc'd after 249 lb weight loss    Tobacco Use: Social History   Tobacco Use  Smoking Status Never Smoker  Smokeless Tobacco Never Used    Labs: Recent Review Scientist, physiological    Labs for ITP Cardiac and Pulmonary Rehab Latest Ref Rng & Units 01/21/2013 11/10/2019   Cholestrol 0 - 200 mg/dL - 70   LDLCALC 0 - 99 mg/dL - 33   HDL >40 mg/dL - 29(L)   Trlycerides <150 mg/dL - 40   Hemoglobin A1c 4.8 - 5.6 % 5.7 5.1       Exercise Target Goals: Exercise Program Goal: Individual exercise prescription set using results from initial 6 min walk test and THRR while considering  patients activity barriers and safety.   Exercise Prescription Goal: Initial exercise prescription builds to 30-45 minutes a day of aerobic activity, 2-3 days per week.  Home exercise guidelines will be given to patient during program as part of exercise prescription that  the participant will acknowledge.   Education: Aerobic Exercise & Resistance Training: - Gives group verbal and written instruction on the various components of exercise. Focuses on aerobic and resistive training programs and the benefits of this training and how to safely progress through these programs..   Cardiac Rehab from 02/11/2020 in [facility] Cardiac and Pulmonary Rehab  Date 01/21/20  Educator [facility]  Instruction Review Code 1- Verbalizes Understanding      Education: Exercise & Equipment Safety: - Individual verbal instruction and demonstration of equipment use and safety with use of the equipment.   Cardiac Rehab from 02/11/2020 in [facility] Cardiac and Pulmonary Rehab  Date 12/11/19  Educator AS  Instruction Review Code 1- Verbalizes Understanding      Education: Exercise Physiology  & General Exercise Guidelines: - Group verbal and written instruction with models to review the exercise physiology of the cardiovascular system and associated critical values. Provides general exercise guidelines with specific guidelines to those with heart or lung disease.    Cardiac Rehab from 02/11/2020 in [facility] Cardiac and Pulmonary Rehab  Date 12/11/19  Educator AS  Instruction Review Code 1- Verbalizes Understanding      Education: Flexibility, Balance, Mind/Body Relaxation: Provides group verbal/written instruction on the benefits of flexibility and balance training, including mind/body exercise modes such as yoga, pilates and tai chi.  Demonstration and skill practice provided.   Cardiac Rehab from 02/11/2020 in [facility] Cardiac and Pulmonary Rehab  Date 02/04/20  Educator AS  Instruction Review Code 1- Verbalizes Understanding      Activity Barriers & Risk Stratification:   6 Minute Walk:  6 Minute Walk    Row Name 12/11/19 1504 02/06/20 1123       6 Minute Walk   Phase Initial Discharge    Distance 1000 feet 1190 feet    Distance % Change -- 19 %    Distance Feet Change -- 190 ft    Walk Time 6 minutes 6 minutes    # of Rest Breaks 0 0    MPH 1.9 2.25    METS 1.5 2.19    RPE -- 13    VO2 Peak 5.37 7.69    Symptoms No No    Resting HR 75 bpm 65 bpm    Resting BP 124/64 110/64    Resting Oxygen Saturation  95 % --    Exercise Oxygen Saturation  during 6 min walk 95 % --    Max Ex. HR 85 bpm 108 bpm    Max Ex. BP 150/64 162/70    2 Minute Post BP 122/62 --           Oxygen Initial Assessment:   Oxygen Re-Evaluation:   Oxygen Discharge (Final Oxygen Re-Evaluation):   Initial Exercise Prescription:  Initial Exercise Prescription - 12/11/19 1500      Date of Initial Exercise RX and Referring Provider   Date 12/11/19    Referring Provider Paraschos      Treadmill   MPH 1    Grade 0    Minutes 15    METs 1.77      Recumbant Bike   Level 1    RPM 60     Minutes 15    METs 1.5      NuStep   Level 1    SPM 80    Minutes 15    METs 1.5      T5 Nustep   Level 1    SPM 80    Minutes  15    METs 1.5      Prescription Details   Frequency (times per week) 3    Duration Progress to 30 minutes of continuous aerobic without signs/symptoms of physical distress      Intensity   THRR 40-80% of Max Heartrate 104-134    Ratings of Perceived Exertion 11-13    Perceived Dyspnea 0-4      Resistance Training   Training Prescription Yes    Weight 5 lb    Reps 10-15           Perform Capillary Blood Glucose checks as needed.  Exercise Prescription Changes:  Exercise Prescription Changes    Row Name 12/11/19 1500 12/15/19 1400 12/29/19 1100 12/31/19 1200 01/14/20 1300     Response to Exercise   Blood Pressure (Admit) 124/64 110/60 -- 122/62 104/64   Blood Pressure (Exercise) 150/64 124/70 -- 124/70 132/66   Blood Pressure (Exit) 122/62 128/64 -- 126/70 108/60   Heart Rate (Admit) 75 bpm 60 bpm -- 57 bpm 70 bpm   Heart Rate (Exercise) 85 bpm 88 bpm -- 99 bpm 94 bpm   Heart Rate (Exit) 61 bpm 57 bpm -- 68 bpm 54 bpm   Oxygen Saturation (Admit) 95 % -- -- -- --   Oxygen Saturation (Exercise) 95 % -- -- -- --   Rating of Perceived Exertion (Exercise) -- 13 -- 12 12   Symptoms none none -- none none   Comments -- first full day of exercise -- -- --   Duration -- Progress to 30 minutes of  aerobic without signs/symptoms of physical distress -- Continue with 30 min of aerobic exercise without signs/symptoms of physical distress. Continue with 30 min of aerobic exercise without signs/symptoms of physical distress.   Intensity -- THRR unchanged -- THRR unchanged THRR unchanged     Progression   Progression -- Continue to progress workloads to maintain intensity without signs/symptoms of physical distress. -- Continue to progress workloads to maintain intensity without signs/symptoms of physical distress. Continue to progress workloads to  maintain intensity without signs/symptoms of physical distress.   Average METs -- 1.7 -- 2.35 2.37     Resistance Training   Training Prescription -- Yes -- Yes Yes   Weight -- 5 lb -- 5 lb 5 lb   Reps -- 10-15 -- 10-15 10-15     Interval Training   Interval Training -- No -- No No     Recumbant Bike   Level -- 1 -- 1 1.5   RPM -- -- -- 60 --   Minutes -- 15 -- 15 15   METs -- 1.8 -- 2.66 2.4     NuStep   Level -- 1 -- 4 4   SPM -- -- -- 80 --   Minutes -- 15 -- 15 15   METs -- 1.6 -- 2.1 2.9     Recumbant Elliptical   Level -- -- -- -- 1.5   Minutes -- -- -- -- 30   METs -- -- -- -- 1.8     Home Exercise Plan   Plans to continue exercise at -- -- Home (comment)  walking -- Home (comment)  walking   Frequency -- -- Add 2 additional days to program exercise sessions. -- Add 2 additional days to program exercise sessions.   Initial Home Exercises Provided -- -- 12/29/19 -- 12/29/19   Row Name 01/28/20 1300 02/10/20 1600           Response to  Exercise   Blood Pressure (Admit) 114/64 110/64      Blood Pressure (Exercise) 130/70 162/70      Blood Pressure (Exit) 122/70 130/68      Heart Rate (Admit) 59 bpm 69 bpm      Heart Rate (Exercise) 99 bpm 108 bpm      Heart Rate (Exit) 54 bpm 52 bpm      Rating of Perceived Exertion (Exercise) 12 12      Symptoms none none      Duration Continue with 30 min of aerobic exercise without signs/symptoms of physical distress. Continue with 30 min of aerobic exercise without signs/symptoms of physical distress.      Intensity THRR unchanged THRR unchanged        Progression   Progression Continue to progress workloads to maintain intensity without signs/symptoms of physical distress. Continue to progress workloads to maintain intensity without signs/symptoms of physical distress.      Average METs 2.3 2.6        Resistance Training   Training Prescription Yes Yes      Weight 5 lb 5 lb      Reps 10-15 10-15        Interval  Training   Interval Training No No        Recumbant Bike   Level 2 2      RPM 60 --      Minutes 15 15      METs 2.66 --        NuStep   Level 4 5      SPM 80 --      Minutes 15 15      METs 2 2.8        Biostep-RELP   Level -- 2      Minutes -- 15      METs -- 3        Home Exercise Plan   Plans to continue exercise at Home (comment)  walking Home (comment)  walking      Frequency Add 2 additional days to program exercise sessions. Add 2 additional days to program exercise sessions.      Initial Home Exercises Provided 12/29/19 12/29/19             Exercise Comments:   Exercise Goals and Review:  Exercise Goals    Row Name 12/11/19 1510             Exercise Goals   Increase Physical Activity Yes       Intervention Provide advice, education, support and counseling about physical activity/exercise needs.;Develop an individualized exercise prescription for aerobic and resistive training based on initial evaluation findings, risk stratification, comorbidities and participant's personal goals.       Expected Outcomes Short Term: Attend rehab on a regular basis to increase amount of physical activity.;Long Term: Add in home exercise to make exercise part of routine and to increase amount of physical activity.;Long Term: Exercising regularly at least 3-5 days a week.       Increase Strength and Stamina Yes       Intervention Provide advice, education, support and counseling about physical activity/exercise needs.;Develop an individualized exercise prescription for aerobic and resistive training based on initial evaluation findings, risk stratification, comorbidities and participant's personal goals.       Expected Outcomes Short Term: Increase workloads from initial exercise prescription for resistance, speed, and METs.;Short Term: Perform resistance training exercises routinely during rehab and add in resistance training at  home;Long Term: Improve cardiorespiratory fitness,  muscular endurance and strength as measured by increased METs and functional capacity (6MWT)       Able to understand and use rate of perceived exertion (RPE) scale Yes       Intervention Provide education and explanation on how to use RPE scale       Expected Outcomes Short Term: Able to use RPE daily in rehab to express subjective intensity level;Long Term:  Able to use RPE to guide intensity level when exercising independently       Able to understand and use Dyspnea scale Yes       Intervention Provide education and explanation on how to use Dyspnea scale       Expected Outcomes Short Term: Able to use Dyspnea scale daily in rehab to express subjective sense of shortness of breath during exertion;Long Term: Able to use Dyspnea scale to guide intensity level when exercising independently       Knowledge and understanding of Target Heart Rate Range (THRR) Yes       Intervention Provide education and explanation of THRR including how the numbers were predicted and where they are located for reference       Expected Outcomes Short Term: Able to state/look up THRR;Short Term: Able to use daily as guideline for intensity in rehab;Long Term: Able to use THRR to govern intensity when exercising independently       Able to check pulse independently Yes       Intervention Provide education and demonstration on how to check pulse in carotid and radial arteries.;Review the importance of being able to check your own pulse for safety during independent exercise       Expected Outcomes Short Term: Able to explain why pulse checking is important during independent exercise;Long Term: Able to check pulse independently and accurately       Understanding of Exercise Prescription Yes       Intervention Provide education, explanation, and written materials on patient's individual exercise prescription       Expected Outcomes Short Term: Able to explain program exercise prescription;Long Term: Able to explain home  exercise prescription to exercise independently              Exercise Goals Re-Evaluation :  Exercise Goals Re-Evaluation    Row Name 12/15/19 1114 12/29/19 1115 01/14/20 1353 01/19/20 1129 01/28/20 1322     Exercise Goal Re-Evaluation   Exercise Goals Review Increase Physical Activity;Able to understand and use rate of perceived exertion (RPE) scale;Knowledge and understanding of Target Heart Rate Range (THRR);Understanding of Exercise Prescription;Increase Strength and Stamina;Able to check pulse independently Increase Physical Activity;Able to understand and use rate of perceived exertion (RPE) scale;Knowledge and understanding of Target Heart Rate Range (THRR);Understanding of Exercise Prescription;Increase Strength and Stamina;Able to check pulse independently Increase Physical Activity;Increase Strength and Stamina;Understanding of Exercise Prescription Increase Physical Activity;Increase Strength and Stamina;Understanding of Exercise Prescription Increase Physical Activity;Increase Strength and Stamina;Understanding of Exercise Prescription   Comments Reviewed RPE and dyspnea scales, THR and program prescription with pt today.  Pt voiced understanding and was given a copy of goals to take home. Clair Gulling is off to a good start in rehab. He is already walking for about 10-15 min at home.  We talked about extending that time to 30 min. Reviewed home exercise with pt today.  Pt plans to walking for exercise.  Reviewed THR, pulse, RPE, sign and symptoms, pulse oximetery and when to call 911 or MD.  Also  discussed weather considerations and indoor options.  Pt voiced understanding. Clair Gulling is doing well in rehab.  He has not been doing the treadmill recently but getting in 30 min on the recumbent elliptical!  We will continue to encourage him push for the proper pace on there and continue to monitor his progress. Clair Gulling attends consistently and hasnt missed a day yet!  he has noticed he can walk further than he  used to and doesnt get as tired. Clair Gulling does work at [employer] although his HR doesnt reach THR range.  Staff will encourage him to continue to progress workloads.   Expected Outcomes Short: Use RPE daily to regulate intensity. Long: Follow program prescription in THR. Short: Extend time of walking Long: Continue to improve stamina. Short: Improve pace on recumbent ellipitical  Long: Continue to improve stamina. Short: continue to attend consistently Long:  improve overall stamina Short: add resistance to seated machines Long: improve MET level   Row Name 02/10/20 1625             Exercise Goal Re-Evaluation   Exercise Goals Review Increase Physical Activity;Increase Strength and Stamina;Understanding of Exercise Prescription       Comments Laverna Peace is doing well in rehab.  He improved his post 6MWT by 166f.  He is planning to continue to exericse by walking at home after graduation.  We will continue to monitor his progress.       Expected Outcomes Short: Graduate Long: Continue to exercise independently              Discharge Exercise Prescription (Final Exercise Prescription Changes):  Exercise Prescription Changes - 02/10/20 1600      Response to Exercise   Blood Pressure (Admit) 110/64    Blood Pressure (Exercise) 162/70    Blood Pressure (Exit) 130/68    Heart Rate (Admit) 69 bpm    Heart Rate (Exercise) 108 bpm    Heart Rate (Exit) 52 bpm    Rating of Perceived Exertion (Exercise) 12    Symptoms none    Duration Continue with 30 min of aerobic exercise without signs/symptoms of physical distress.    Intensity THRR unchanged      Progression   Progression Continue to progress workloads to maintain intensity without signs/symptoms of physical distress.    Average METs 2.6      Resistance Training   Training Prescription Yes    Weight 5 lb    Reps 10-15      Interval Training   Interval Training No      Recumbant Bike   Level 2    Minutes 15      NuStep   Level 5     Minutes 15    METs 2.8      Biostep-RELP   Level 2    Minutes 15    METs 3      Home Exercise Plan   Plans to continue exercise at Home (comment)   walking   Frequency Add 2 additional days to program exercise sessions.    Initial Home Exercises Provided 12/29/19           Nutrition:  Target Goals: Understanding of nutrition guidelines, daily intake of sodium <15067m cholesterol <20056mcalories 30% from fat and 7% or less from saturated fats, daily to have 5 or more servings of fruits and vegetables.  Education: Controlling Sodium/Reading Food Labels -Group verbal and written material supporting the discussion of sodium use in heart healthy nutrition. Review and explanation  with models, verbal and written materials for utilization of the food label.   Education: General Nutrition Guidelines/Fats and Fiber: -Group instruction provided by verbal, written material, models and posters to present the general guidelines for heart healthy nutrition. Gives an explanation and review of dietary fats and fiber.   Cardiac Rehab from 02/11/2020 in [facility] Cardiac and Pulmonary Rehab  Date 02/11/20  Educator [facility]  Instruction Review Code 1- Verbalizes Understanding      Biometrics:  Pre Biometrics - 12/11/19 1520      Pre Biometrics   Height 5' 7" (1.702 m)    Weight 258 lb 14.4 oz (117.4 kg)    BMI (Calculated) 40.54    Single Leg Stand 2.9 seconds           Post Biometrics - 02/06/20 1124       Post  Biometrics   Height 5' 7" (1.702 m)    Weight 261 lb 6.4 oz (118.6 kg)    BMI (Calculated) 40.93           Nutrition Therapy Plan and Nutrition Goals:  Nutrition Therapy & Goals - 12/26/19 0730      Nutrition Therapy   Diet Heart healthy, low sodium    Drug/Food Interactions Statins/Certain Fruits    Protein (specify units) 95-100g    Fiber 30 grams    Whole Grain Foods 3 servings    Saturated Fats 12 max. grams    Fruits and Vegetables 5 servings/day    Sodium 1.5 grams       Personal Nutrition Goals   Nutrition Goal LT: Strengthen heart, strengthen legs    Comments Retired so no current schedule. Couple pieces of bacon with eggs (no oil). L: chicken (grilled or boiled) alone or with sandwich, beans or small potato with some kind of vegetables. S: [place] rolled up . Drinks water or crystal lite. D: hamburger steak or ham - boiled or baked or fish. beans or small potato with some kind of vegetables. Seldom eats fried foods, rarely has soft drinks. Has his own garden. doesn't east much red meat. Discussed heart healthy eating. Pt would like to review paperwork.      Intervention Plan   Intervention Prescribe, educate and counsel regarding individualized specific dietary modifications aiming towards targeted core components such as weight, hypertension, lipid management, diabetes, heart failure and other comorbidities.;Nutrition handout(s) given to patient.    Expected Outcomes Short Term Goal: Understand basic principles of dietary content, such as calories, fat, sodium, cholesterol and nutrients.;Short Term Goal: A plan has been developed with personal nutrition goals set during dietitian appointment.;Long Term Goal: Adherence to prescribed nutrition plan.           Nutrition Assessments:  Nutrition Assessments - 02/11/20 1130      MEDFICTS Scores   Post Score 70           MEDIFICTS Score Key:          ?70 Need to make dietary changes          40-70 Heart Healthy Diet         ? 40 Therapeutic Level Cholesterol Diet  Nutrition Goals Re-Evaluation:  Nutrition Goals Re-Evaluation    [place] Name 01/19/20 1136             Goals   Nutrition Goal ST: Pt does not want to make any chnages at this time LT: Strengthen heart, strengthen legs       Comment Pt doesn't have any current questions  or concerns at this time and would like to continue with current diet.       Expected Outcome ST: Pt does not want to make any chnages at this time LT: Strengthen heart,  strengthen legs              Nutrition Goals Discharge (Final Nutrition Goals Re-Evaluation):  Nutrition Goals Re-Evaluation - 01/19/20 1136      Goals   Nutrition Goal ST: Pt does not want to make any chnages at this time LT: Strengthen heart, strengthen legs    Comment Pt doesn't have any current questions or concerns at this time and would like to continue with current diet.    Expected Outcome ST: Pt does not want to make any chnages at this time LT: Strengthen heart, strengthen legs           Psychosocial: Target Goals: Acknowledge presence or absence of significant depression and/or stress, maximize coping skills, provide positive support system. Participant is able to verbalize types and ability to use techniques and skills needed for reducing stress and depression.   Education: Depression - Provides group verbal and written instruction on the correlation between heart/lung disease and depressed mood, treatment options, and the stigmas associated with seeking treatment.   Cardiac Rehab from 02/11/2020 in [facility] Cardiac and Pulmonary Rehab  Date 01/07/20  Educator [facility]  Instruction Review Code 1- [employer] Understanding      Education: Sleep Hygiene -Provides group verbal and written instruction about how sleep can affect your health.  Define sleep hygiene, discuss sleep cycles and impact of sleep habits. Review good sleep hygiene tips.     Education: Stress and Anxiety: - Provides group verbal and written instruction about the health risks of elevated stress and causes of high stress.  Discuss the correlation between heart/lung disease and anxiety and treatment options. Review healthy ways to manage with stress and anxiety.   Cardiac Rehab from 02/11/2020 in [facility] Cardiac and Pulmonary Rehab  Date 01/07/20  Educator [facility]  Instruction Review Code 1- Verbalizes Understanding       Initial Review & Psychosocial Screening:  Initial Psych Review & Screening - 12/04/19 1438       Initial Review   Current issues with Current Psychotropic Meds;None Identified      Family Dynamics   Good Support System? Yes    Comments He can look to his wife, daughter and grandson for support.      Barriers   Psychosocial barriers to participate in program There are no identifiable barriers or psychosocial needs.;The patient should benefit from training in stress management and relaxation.      Screening Interventions   Interventions Encouraged to exercise;Provide feedback about the scores to participant;To provide support and resources with identified psychosocial needs    Expected Outcomes Short Term goal: Utilizing psychosocial counselor, staff and physician to assist with identification of specific Stressors or current issues interfering with healing process. Setting desired goal for each stressor or current issue identified.;Long Term Goal: Stressors or current issues are controlled or eliminated.;Short Term goal: Identification and review with participant of any Quality of Life or Depression concerns found by scoring the questionnaire.;Long Term goal: The participant improves quality of Life and PHQ9 Scores as seen by post scores and/or verbalization of changes           Quality of Life Scores:   Quality of Life - 02/11/20 1127      Quality of Life Scores   Health/Function Pre 24.43 %  Health/Function Post 26.64 %    Health/Function % Change 9.05 %    Socioeconomic Pre 25.48 %    Socioeconomic Post 30 %    Socioeconomic % Change  17.74 %    Psych/Spiritual Pre 28.57 %    Psych/Spiritual Post 27.86 %    Psych/Spiritual % Change -2.49 %    Family Pre 30 %    Family Post 30 %    Family % Change 0 %    GLOBAL Pre 26.22 %    GLOBAL Post 28.06 %    GLOBAL % Change 7.02 %          Scores of 19 and below usually indicate a poorer quality of life in these areas.  A difference of  2-3 points is a clinically meaningful difference.  A difference of 2-3 points in the  total score of the Quality of Life Index has been associated with significant improvement in overall quality of life, self-image, physical symptoms, and general health in studies assessing change in quality of life.  PHQ-9: Recent Review Flowsheet Data    Depression screen [facility] 2/9 02/11/2020 12/11/2019 10/10/2016 10/10/2016   Decreased Interest 0 0 0 0   Down, Depressed, Hopeless 0 0 0 0   PHQ - 2 Score 0 0 0 0   Altered sleeping 0 0 0 -   Tired, decreased energy 0 0 0 -   Change in appetite 0 0 0 -   Feeling bad or failure about yourself  0 0 0 -   Trouble concentrating 0 0 0 -   Moving slowly or fidgety/restless 0 0 0 -   Suicidal thoughts 0 0 0 -   PHQ-9 Score 0 0 0 -     Interpretation of Total Score  Total Score Depression Severity:  1-4 = Minimal depression, 5-9 = Mild depression, 10-14 = Moderate depression, 15-19 = Moderately severe depression, 20-27 = Severe depression   Psychosocial Evaluation and Intervention:  Psychosocial Evaluation - 12/04/19 1439      Psychosocial Evaluation & Interventions   Interventions Encouraged to exercise with the program and follow exercise prescription    Comments He can look to his wife, daughter and grandson for support. Patient did the [facility] program about 7 or eight years ago. He is feeling positive about his health and is ready for Rehab.    Continue Psychosocial Services  Follow up required by staff           Psychosocial Re-Evaluation:  Psychosocial Re-Evaluation    [place] Name 12/29/19 1119 01/19/20 1127           Psychosocial Re-Evaluation   Current issues with Current Psychotropic Meds Current Sleep Concerns;Current Stress Concerns      Comments Clair Gulling is doing well in rehab.  His wife is joining [employer] while he is coming to rehab.  He denies any current symptims of depression or anxiety.  He is feeling good.  He enjoys coming to rehab and to get out and about.  They have a new dog next door and his is not a fan.  They are  getting better with each other. Jim's wife is still going to [employer].  He stays up late at night but sleeps 6-8 hours at night.  He feels rested.      Expected Outcomes Short: Continue to exercise to build stamina Long: Continue to stay positive. Short: continue to attend Cardiac Rehab for the exercise Long: maintain positive attitude  Interventions Encouraged to attend Cardiac Rehabilitation for the exercise --      Continue Psychosocial Services  Follow up required by staff --             Psychosocial Discharge (Final Psychosocial Re-Evaluation):  Psychosocial Re-Evaluation - 01/19/20 1127      Psychosocial Re-Evaluation   Current issues with Current Sleep Concerns;Current Stress Concerns    Comments Jim's wife is still going to [employer].  He stays up late at night but sleeps 6-8 hours at night.  He feels rested.    Expected Outcomes Short: continue to attend Cardiac Rehab for the exercise Long: maintain positive attitude           Vocational Rehabilitation: Provide vocational rehab assistance to qualifying candidates.   Vocational Rehab Evaluation & Intervention:   Education: Education Goals: Education classes will be provided on a variety of topics geared toward better understanding of heart health and risk factor modification. Participant will state understanding/return demonstration of topics presented as noted by education test scores.  Learning Barriers/Preferences:   General Cardiac Education Topics:  AED/CPR: - Group verbal and written instruction with the use of models to demonstrate the basic use of the AED with the basic ABC's of resuscitation.   Anatomy & Physiology of the Heart: - Group verbal and written instruction and models provide basic cardiac anatomy and physiology, with the coronary electrical and arterial systems. Review of Valvular disease and Heart Failure   Cardiac Procedures: - Group verbal and written instruction to review  commonly prescribed medications for heart disease. Reviews the medication, class of the drug, and side effects. Includes the steps to properly store meds and maintain the prescription regimen. (beta blockers and nitrates)   Cardiac Rehab from 02/11/2020 in [facility] Cardiac and Pulmonary Rehab  Date 01/28/20  Educator [facility]  Instruction Review Code 1- Verbalizes Understanding      Cardiac Medications I: - Group verbal and written instruction to review commonly prescribed medications for heart disease. Reviews the medication, class of the drug, and side effects. Includes the steps to properly store meds and maintain the prescription regimen.   Cardiac Rehab from 02/11/2020 in [facility] Cardiac and Pulmonary Rehab  Date 12/31/19  Educator SB  Instruction Review Code 1- Verbalizes Understanding      Cardiac Medications II: -Group verbal and written instruction to review commonly prescribed medications for heart disease. Reviews the medication, class of the drug, and side effects. (all other drug classes)   Cardiac Rehab from 02/11/2020 in [facility] Cardiac and Pulmonary Rehab  Date 01/14/20  Educator [facility]  Instruction Review Code 1- Verbalizes Understanding       Go Sex-Intimacy & Heart Disease, Get SMART - Goal Setting: - Group verbal and written instruction through game format to discuss heart disease and the return to sexual intimacy. Provides group verbal and written material to discuss and apply goal setting through the application of the S.M.A.R.T. Method.   Cardiac Rehab from 02/11/2020 in [facility] Cardiac and Pulmonary Rehab  Date 01/28/20  Educator [facility]  Instruction Review Code 1- Verbalizes Understanding      Other Matters of the Heart: - Provides group verbal, written materials and models to describe Stable Angina and Peripheral Artery. Includes description of the disease process and treatment options available to the cardiac patient.   Infection Prevention: - Provides verbal and written material to  individual with discussion of infection control including proper hand washing and proper equipment cleaning during exercise session.   Cardiac Rehab  from 02/11/2020 in [facility] Cardiac and Pulmonary Rehab  Date 12/11/19  Educator AS  Instruction Review Code 1- Verbalizes Understanding      Falls Prevention: - Provides verbal and written material to individual with discussion of falls prevention and safety.   Cardiac Rehab from 02/11/2020 in [facility] Cardiac and Pulmonary Rehab  Date 12/11/19  Educator AS  Instruction Review Code 1- Verbalizes Understanding      Other: -Provides group and verbal instruction on various topics (see comments)   Knowledge Questionnaire Score:  Knowledge Questionnaire Score - 02/11/20 1129      Knowledge Questionnaire Score   Post Score 26/26           Core Components/Risk Factors/Patient Goals at Admission:  Personal Goals and Risk Factors at Admission - 12/11/19 1514      Core Components/Risk Factors/Patient Goals on Admission    Weight Management Yes;Weight Loss    Intervention Weight Management: Develop a combined nutrition and exercise program designed to reach desired caloric intake, while maintaining appropriate intake of nutrient and fiber, sodium and fats, and appropriate energy expenditure required for the weight goal.;Weight Management: Provide education and appropriate resources to help participant work on and attain dietary goals.;Weight Management/Obesity: Establish reasonable short term and long term weight goals.    Admit Weight 258 lb 14.4 oz (117.4 kg)    Goal Weight: Short Term 250 lb (113.4 kg)    Goal Weight: Long Term 245 lb (111.1 kg)    Expected Outcomes Short Term: Continue to assess and modify interventions until short term weight is achieved;Long Term: Adherence to nutrition and physical activity/exercise program aimed toward attainment of established weight goal;Weight Maintenance: Understanding of the daily nutrition guidelines,  which includes 25-35% calories from fat, 7% or less cal from saturated fats, less than 236m cholesterol, less than 1.5gm of sodium, & 5 or more servings of fruits and vegetables daily;Weight Loss: Understanding of general recommendations for a balanced deficit meal plan, which promotes 1-2 lb weight loss per week and includes a negative energy balance of [phone] kcal/d;Understanding recommendations for meals to include 15-35% energy as protein, 25-35% energy from fat, 35-60% energy from carbohydrates, less than 2015mof dietary cholesterol, 20-35 gm of total fiber daily    Heart Failure Yes    Intervention Provide a combined exercise and nutrition program that is supplemented with education, support and counseling about heart failure. Directed toward relieving symptoms such as shortness of breath, decreased exercise tolerance, and extremity edema.    Expected Outcomes Improve functional capacity of life;Short term: Attendance in program 2-3 days a week with increased exercise capacity. Reported lower sodium intake. Reported increased fruit and vegetable intake. Reports medication compliance.;Short term: Daily weights obtained and reported for increase. Utilizing diuretic protocols set by physician.;Long term: Adoption of self-care skills and reduction of barriers for early signs and symptoms recognition and intervention leading to self-care maintenance.    Intervention Provide education on lifestyle modifcations including regular physical activity/exercise, weight management, moderate sodium restriction and increased consumption of fresh fruit, vegetables, and low fat dairy, alcohol moderation, and smoking cessation.;Monitor prescription use compliance.    Expected Outcomes Short Term: Continued assessment and intervention until BP is < 140/9017mG in hypertensive participants. < 130/59m50m in hypertensive participants with diabetes, heart failure or chronic kidney disease.;Long Term: Maintenance of blood  pressure at goal levels.    Intervention Provide education and support for participant on nutrition & aerobic/resistive exercise along with prescribed medications to achieve LDL <70mg42mL >40mg.76m  Expected Outcomes Short Term: Participant states understanding of desired cholesterol values and is compliant with medications prescribed. Participant is following exercise prescription and nutrition guidelines.;Long Term: Cholesterol controlled with medications as prescribed, with individualized exercise RX and with personalized nutrition plan. Value goals: LDL < 54m, HDL > 40 mg.           Education:Diabetes - Individual verbal and written instruction to review signs/symptoms of diabetes, desired ranges of glucose level fasting, after meals and with exercise. Acknowledge that pre and post exercise glucose checks will be done for 3 sessions at entry of program.   Education: Know Your Numbers and Risk Factors: -Group verbal and written instruction about important numbers in your health.  Discussion of what are risk factors and how they play a role in the disease process.  Review of Cholesterol, Blood Pressure, Diabetes, and BMI and the role they play in your overall health.   [facility]  Date [facility]/14[facility]20  [facility] Code 1- Verbalizes Understanding      Core Components/Risk Factors/Patient Goals Review:   Goals and Risk Factor Review    Row Name 12/29/19 1121 01/19/20 1125           Core Components/Risk Factors/Patient Goals Review   Personal Goals Review Weight Management/Obesity;Heart Failure;Hypertension;Lipids Weight Management/Obesity;Heart Failure;Hypertension;Lipids      Review JClair Gullingis doing well in rehab.  He is off to a good start.  His weight has been stable for the most part.  He denies any heart failure symptoms.  We reviewed heart failure zones and handout given.  His blood pressures have been good and he  checks them regularly at home.  He is doing well on his medications as well. JClair Gullingreports his weight is steady.  He is taking all medicatins as directed.  He denies HF symptoms.  He continues to monitor BP at home.      Expected Outcomes Short: Review heart failure handout  Long; Continue to work on weight loss and monitoring risk factors. Short: continue to monitor risk factors Long:  manage risk factors long term             Core Components/Risk Factors/Patient Goals at Discharge (Final Review):   Goals and Risk Factor Review - 01/19/20 1125      Core Components/Risk Factors/Patient Goals Review   Personal Goals Review Weight Management/Obesity;Heart Failure;Hypertension;Lipids    Review JClair Gullingreports his weight is steady.  He is taking all medicatins as directed.  He denies HF symptoms.  He continues to monitor BP at home.    Expected Outcomes Short: continue to monitor risk factors Long:  manage risk factors long term           ITP Comments:  ITP Comments    Row Name 12/04/19 1441 12/11/19 1519 12/15/19 1111 12/17/19 0608 01/14/20 0551   ITP Comments Virtual Visit completed. Patient informed on EP and RD appointment and 6 Minute walk test. Patient also informed of patient health questionnaires on My Chart. Patient Verbalizes understanding. [facility] Completed 6MWT and gym orientation. Initial ITP created and sent for review to Dr. MEmily Filbert Medical Director. First full day of exercise!  Patient was oriented to gym and equipment including functions, settings, policies, and procedures.  Patient's individual exercise prescription and treatment plan were reviewed.  All starting workloads were established based on the results of the 6 minute walk test done at  initial orientation visit.  The plan for exercise progression was also introduced and progression will be customized based on patient's performance and goals 30 Day review completed. Medical Director ITP  review done, changes made as directed, and signed approval by Medical Director.  New to program 30 Day review completed. Medical Director ITP review done, changes made as directed, and signed approval by Medical Director.   [place] Name 02/11/20 1439           ITP Comments 30 day review completed. ITP sent to Dr. Emily Filbert, Medical Director of Cardiac and Pulmonary Rehab. Continue with ITP unless changes are made by physician.              Comments: 30 day review

## 2020-02-13 ENCOUNTER — Other Ambulatory Visit: Payer: Self-pay

## 2020-02-13 ENCOUNTER — Encounter: Payer: Medicare Other | Admitting: *Deleted

## 2020-02-13 DIAGNOSIS — I214 Non-ST elevation (NSTEMI) myocardial infarction: Secondary | ICD-10-CM

## 2020-02-13 DIAGNOSIS — I252 Old myocardial infarction: Secondary | ICD-10-CM | POA: Diagnosis not present

## 2020-02-13 NOTE — Patient Instructions (Signed)
Discharge Patient Instructions  Patient Details  Name: William Armstrong MRN: [id number] Date of Birth: Aug 19, 1950 Referring Provider:  Isaias Cowman, MD   Number of Visits: 88  Reason for Discharge:  Patient reached a stable level of exercise. Patient independent in their exercise. Patient has met program and personal goals.  Smoking History:  Social History   Tobacco Use  Smoking Status Never Smoker  Smokeless Tobacco Never Used    Diagnosis:  NSTEMI (non-ST elevated myocardial infarction) [facility])  Initial Exercise Prescription:  Initial Exercise Prescription - 12/11/19 1500      Date of Initial Exercise RX and Referring Provider   Date 12/11/19    Referring Provider Paraschos      Treadmill   MPH 1    Grade 0    Minutes 15    METs 1.77      Recumbant Bike   Level 1    RPM 60    Minutes 15    METs 1.5      NuStep   Level 1    SPM 80    Minutes 15    METs 1.5      T5 Nustep   Level 1    SPM 80    Minutes 15    METs 1.5      Prescription Details   Frequency (times per week) 3    Duration Progress to 30 minutes of continuous aerobic without signs/symptoms of physical distress      Intensity   THRR 40-80% of Max Heartrate 104-134    Ratings of Perceived Exertion 11-13    Perceived Dyspnea 0-4      Resistance Training   Training Prescription Yes    Weight 5 lb    Reps 10-15           Discharge Exercise Prescription (Final Exercise Prescription Changes):  Exercise Prescription Changes - 02/10/20 1600      Response to Exercise   Blood Pressure (Admit) 110/64    Blood Pressure (Exercise) 162/70    Blood Pressure (Exit) 130/68    Heart Rate (Admit) 69 bpm    Heart Rate (Exercise) 108 bpm    Heart Rate (Exit) 52 bpm    Rating of Perceived Exertion (Exercise) 12    Symptoms none    Duration Continue with 30 min of aerobic exercise without signs/symptoms of physical distress.    Intensity THRR unchanged      Progression   Progression  Continue to progress workloads to maintain intensity without signs/symptoms of physical distress.    Average METs 2.6      Resistance Training   Training Prescription Yes    Weight 5 lb    Reps 10-15      Interval Training   Interval Training No      Recumbant Bike   Level 2    Minutes 15      NuStep   Level 5    Minutes 15    METs 2.8      Biostep-RELP   Level 2    Minutes 15    METs 3      Home Exercise Plan   Plans to continue exercise at Home (comment)   walking   Frequency Add 2 additional days to program exercise sessions.    Initial Home Exercises Provided 12/29/19           Functional Capacity:  6 Minute Walk    Row Name 12/11/19 1504 02/06/20 1123  6 Minute Walk   Phase Initial Discharge    Distance 1000 feet 1190 feet    Distance % Change -- 19 %    Distance Feet Change -- 190 ft    Walk Time 6 minutes 6 minutes    # of Rest Breaks 0 0    MPH 1.9 2.25    METS 1.5 2.19    RPE -- 13    VO2 Peak 5.37 7.69    Symptoms No No    Resting HR 75 bpm 65 bpm    Resting BP 124/64 110/64    Resting Oxygen Saturation  95 % --    Exercise Oxygen Saturation  during 6 min walk 95 % --    Max Ex. HR 85 bpm 108 bpm    Max Ex. BP 150/64 162/70    2 Minute Post BP 122/62 --           Quality of Life:  Quality of Life - 02/11/20 1127      Quality of Life Scores   Health/Function Pre 24.43 %    Health/Function Post 26.64 %    Health/Function % Change 9.05 %    Socioeconomic Pre 25.48 %    Socioeconomic Post 30 %    Socioeconomic % Change  17.74 %    Psych/Spiritual Pre 28.57 %    Psych/Spiritual Post 27.86 %    Psych/Spiritual % Change -2.49 %    Family Pre 30 %    Family Post 30 %    Family % Change 0 %    GLOBAL Pre 26.22 %    GLOBAL Post 28.06 %    GLOBAL % Change 7.02 %           Nutrition & Weight - Outcomes:  Pre Biometrics - 12/11/19 1520      Pre Biometrics   Height 5' 7" (1.702 m)    Weight 258 lb 14.4 oz (117.4 kg)    BMI  (Calculated) 40.54    Single Leg Stand 2.9 seconds           Post Biometrics - 02/06/20 1124       Post  Biometrics   Height 5' 7" (1.702 m)    Weight 261 lb 6.4 oz (118.6 kg)    BMI (Calculated) 40.93           Nutrition:  Nutrition Therapy & Goals - 12/26/19 0730      Nutrition Therapy   Diet Heart healthy, low sodium    Drug/Food Interactions Statins/Certain Fruits    Protein (specify units) 95-100g    Fiber 30 grams    Whole Grain Foods 3 servings    Saturated Fats 12 max. grams    Fruits and Vegetables 5 servings/day    Sodium 1.5 grams      Personal Nutrition Goals   Nutrition Goal LT: Strengthen heart, strengthen legs    Comments Retired so no current schedule. Couple pieces of bacon with eggs (no oil). L: chicken (grilled or boiled) alone or with sandwich, beans or small potato with some kind of vegetables. S: turkey rolled up . Drinks water or William lite. D: hamburger steak or ham - boiled or baked or fish. beans or small potato with some kind of vegetables. Seldom eats fried foods, rarely has soft drinks. Has his own garden. doesn't east much red meat. Discussed heart healthy eating. Pt would like to review paperwork.      Intervention Plan   Intervention Prescribe, educate   and counsel regarding individualized specific dietary modifications aiming towards targeted core components such as weight, hypertension, lipid management, diabetes, heart failure and other comorbidities.;Nutrition handout(s) given to patient.    Expected Outcomes Short Term Goal: Understand basic principles of dietary content, such as calories, fat, sodium, cholesterol and nutrients.;Short Term Goal: A plan has been developed with personal nutrition goals set during dietitian appointment.;Long Term Goal: Adherence to prescribed nutrition plan.          Goals reviewed with patient; copy given to patient.

## 2020-02-13 NOTE — Progress Notes (Signed)
Daily Session Note  Patient Details  Name: William Armstrong MRN: [id number] Date of Birth: [birth year] Referring Provider:     Cardiac Rehab from 12/11/2019 in [facility] Cardiac and Pulmonary Rehab  Referring Provider Paraschos      Encounter Date: 02/13/2020  Check In:  Session Check In - 02/13/20 1114      Check-In   Supervising physician immediately available to respond to emergencies See telemetry face sheet for immediately available ER MD    Location [facility]-Cardiac & Pulmonary Rehab    Staff Present Renita Papa, RN BSN;Joseph [address] [place], [place], [employer], CCRP, CCET    Virtual Visit No    Medication changes reported     No    Fall or balance concerns reported    No    Warm-up and Cool-down Performed on first and last piece of equipment    Resistance Training Performed Yes    VAD Patient? No    PAD/SET Patient? No      Pain Assessment   Currently in Pain? No/denies              Social History   Tobacco Use  Smoking Status Never Smoker  Smokeless Tobacco Never Used    Goals Met:  Independence with exercise equipment Exercise tolerated well No report of cardiac concerns or symptoms Strength training completed today  Goals Unmet:  Not Applicable  Comments: Pt able to follow exercise prescription today without complaint.  Will continue to monitor for progression.    Dr. Emily Filbert is Medical Director for [facility].

## 2020-02-16 ENCOUNTER — Encounter: Payer: Medicare Other | Admitting: *Deleted

## 2020-02-16 ENCOUNTER — Other Ambulatory Visit: Payer: Self-pay

## 2020-02-16 DIAGNOSIS — I214 Non-ST elevation (NSTEMI) myocardial infarction: Secondary | ICD-10-CM

## 2020-02-16 DIAGNOSIS — I252 Old myocardial infarction: Secondary | ICD-10-CM | POA: Diagnosis not present

## 2020-02-16 NOTE — Progress Notes (Signed)
Cardiac Individual Treatment Plan  Patient Details  Name: William Armstrong MRN: [id number] Date of Birth: Jan 13, 1951 Referring Provider:     Cardiac Rehab from 12/11/2019 in [facility] Cardiac and Pulmonary Rehab  Referring Provider Paraschos      Initial Encounter Date:    Cardiac Rehab from 12/11/2019 in [facility] Cardiac and Pulmonary Rehab  Date 12/11/19      Visit Diagnosis: NSTEMI (non-ST elevated myocardial infarction) [facility])  Patient's Home Medications on Admission:  Current Outpatient Medications:  .  aspirin EC 81 MG tablet, Take 81 mg by mouth daily., Disp: , Rfl:  .  Cholecalciferol (VITAMIN D) 2000 units CAPS, Take 2,000 Units by mouth daily., Disp: , Rfl:  .  clopidogrel (PLAVIX) 75 MG tablet, Take 1 tablet (75 mg total) by mouth daily with breakfast., Disp: 30 tablet, Rfl: 0 .  latanoprost (XALATAN) 0.005 % ophthalmic solution, Place 1 drop into both eyes at bedtime., Disp: , Rfl:  .  lisinopril (PRINIVIL,ZESTRIL) 40 MG tablet, Take 40 mg by mouth daily. , Disp: , Rfl:  .  Potassium Citrate 15 MEQ (1620 MG) TBCR, Take 1 tablet by mouth 2 (two) times daily., Disp: 60 tablet, Rfl: 11 .  rosuvastatin (CRESTOR) 10 MG tablet, Take 10 mg by mouth at bedtime., Disp: , Rfl:  .  rosuvastatin (CRESTOR) 20 MG tablet, Take 1 tablet (20 mg total) by mouth daily., Disp: 30 tablet, Rfl: 0 .  sertraline (ZOLOFT) 100 MG tablet, Take 1 tablet (100 mg total) by mouth daily., Disp: 90 tablet, Rfl: 3 .  spironolactone (ALDACTONE) 25 MG tablet, Take 25 mg by mouth daily., Disp: , Rfl:  .  tamsulosin (FLOMAX) 0.4 MG CAPS capsule, Take 1 capsule (0.4 mg total) by mouth daily., Disp: 30 capsule, Rfl: 0  Past Medical History: Past Medical History:  Diagnosis Date  . Anemia   . Arthritis   . BCC (basal cell carcinoma of skin)   . CHF (congestive heart failure) ([place])   . Complication of anesthesia    neck hurt for 2-3 days after general anesthesia  . COPD (chronic obstructive pulmonary disease) ([place])   .  Coronary artery disease   . Depression   . Glaucoma   . Heart attack ([place])    2013  . History of kidney stones   . HTN (hypertension)   . Hyperglycemia    pt denies  . Hypertension   . Hypokalemia   . Obesity    morbid  . Sleep apnea    osa, CPAP has been dc'd after 249 lb weight loss    Tobacco Use: Social History   Tobacco Use  Smoking Status Never Smoker  Smokeless Tobacco Never Used    Labs: Recent Review Scientist, physiological    Labs for ITP Cardiac and Pulmonary Rehab Latest Ref Rng & Units 01/21/2013 11/10/2019   Cholestrol 0 - 200 mg/dL - 70   LDLCALC 0 - 99 mg/dL - 33   HDL >40 mg/dL - 29(L)   Trlycerides <150 mg/dL - 40   Hemoglobin A1c 4.8 - 5.6 % 5.7 5.1       Exercise Target Goals: Exercise Program Goal: Individual exercise prescription set using results from initial 6 min walk test and THRR while considering  patient's activity barriers and safety.   Exercise Prescription Goal: Initial exercise prescription builds to 30-45 minutes a day of aerobic activity, 2-3 days per week.  Home exercise guidelines will be given to patient during program as part of exercise prescription that  the participant will acknowledge.   Education: Aerobic Exercise & Resistance Training: - Gives group verbal and written instruction on the various components of exercise. Focuses on aerobic and resistive training programs and the benefits of this training and how to safely progress through these programs..   Cardiac Rehab from 02/11/2020 in [facility] Cardiac and Pulmonary Rehab  Date 01/21/20  Educator [facility]  Instruction Review Code 1- Verbalizes Understanding      Education: Exercise & Equipment Safety: - Individual verbal instruction and demonstration of equipment use and safety with use of the equipment.   Cardiac Rehab from 02/11/2020 in [facility] Cardiac and Pulmonary Rehab  Date 12/11/19  Educator AS  Instruction Review Code 1- Verbalizes Understanding      Education: Exercise Physiology  & General Exercise Guidelines: - Group verbal and written instruction with models to review the exercise physiology of the cardiovascular system and associated critical values. Provides general exercise guidelines with specific guidelines to those with heart or lung disease.    Cardiac Rehab from 02/11/2020 in [facility] Cardiac and Pulmonary Rehab  Date 12/11/19  Educator AS  Instruction Review Code 1- Verbalizes Understanding      Education: Flexibility, Balance, Mind/Body Relaxation: Provides group verbal/written instruction on the benefits of flexibility and balance training, including mind/body exercise modes such as yoga, pilates and tai chi.  Demonstration and skill practice provided.   Cardiac Rehab from 02/11/2020 in [facility] Cardiac and Pulmonary Rehab  Date 02/04/20  Educator AS  Instruction Review Code 1- Verbalizes Understanding      Activity Barriers & Risk Stratification:   6 Minute Walk:  6 Minute Walk    Row Name 12/11/19 1504 02/06/20 1123       6 Minute Walk   Phase Initial Discharge    Distance 1000 feet 1190 feet    Distance % Change -- 19 %    Distance Feet Change -- 190 ft    Walk Time 6 minutes 6 minutes    # of Rest Breaks 0 0    MPH 1.9 2.25    METS 1.5 2.19    RPE -- 13    VO2 Peak 5.37 7.69    Symptoms No No    Resting HR 75 bpm 65 bpm    Resting BP 124/64 110/64    Resting Oxygen Saturation  95 % --    Exercise Oxygen Saturation  during 6 min walk 95 % --    Max Ex. HR 85 bpm 108 bpm    Max Ex. BP 150/64 162/70    2 Minute Post BP 122/62 --           Oxygen Initial Assessment:   Oxygen Re-Evaluation:   Oxygen Discharge (Final Oxygen Re-Evaluation):   Initial Exercise Prescription:  Initial Exercise Prescription - 12/11/19 1500      Date of Initial Exercise RX and Referring Provider   Date 12/11/19    Referring Provider Paraschos      Treadmill   MPH 1    Grade 0    Minutes 15    METs 1.77      Recumbant Bike   Level 1    RPM 60     Minutes 15    METs 1.5      NuStep   Level 1    SPM 80    Minutes 15    METs 1.5      T5 Nustep   Level 1    SPM 80    Minutes  15    METs 1.5      Prescription Details   Frequency (times per week) 3    Duration Progress to 30 minutes of continuous aerobic without signs/symptoms of physical distress      Intensity   THRR 40-80% of Max Heartrate 104-134    Ratings of Perceived Exertion 11-13    Perceived Dyspnea 0-4      Resistance Training   Training Prescription Yes    Weight 5 lb    Reps 10-15           Perform Capillary Blood Glucose checks as needed.  Exercise Prescription Changes:  Exercise Prescription Changes    Row Name 12/11/19 1500 12/15/19 1400 12/29/19 1100 12/31/19 1200 01/14/20 1300     Response to Exercise   Blood Pressure (Admit) 124/64 110/60 -- 122/62 104/64   Blood Pressure (Exercise) 150/64 124/70 -- 124/70 132/66   Blood Pressure (Exit) 122/62 128/64 -- 126/70 108/60   Heart Rate (Admit) 75 bpm 60 bpm -- 57 bpm 70 bpm   Heart Rate (Exercise) 85 bpm 88 bpm -- 99 bpm 94 bpm   Heart Rate (Exit) 61 bpm 57 bpm -- 68 bpm 54 bpm   Oxygen Saturation (Admit) 95 % -- -- -- --   Oxygen Saturation (Exercise) 95 % -- -- -- --   Rating of Perceived Exertion (Exercise) -- 13 -- 12 12   Symptoms none none -- none none   Comments -- first full day of exercise -- -- --   Duration -- Progress to 30 minutes of  aerobic without signs/symptoms of physical distress -- Continue with 30 min of aerobic exercise without signs/symptoms of physical distress. Continue with 30 min of aerobic exercise without signs/symptoms of physical distress.   Intensity -- THRR unchanged -- THRR unchanged THRR unchanged     Progression   Progression -- Continue to progress workloads to maintain intensity without signs/symptoms of physical distress. -- Continue to progress workloads to maintain intensity without signs/symptoms of physical distress. Continue to progress workloads to  maintain intensity without signs/symptoms of physical distress.   Average METs -- 1.7 -- 2.35 2.37     Resistance Training   Training Prescription -- Yes -- Yes Yes   Weight -- 5 lb -- 5 lb 5 lb   Reps -- 10-15 -- 10-15 10-15     Interval Training   Interval Training -- No -- No No     Recumbant Bike   Level -- 1 -- 1 1.5   RPM -- -- -- 60 --   Minutes -- 15 -- 15 15   METs -- 1.8 -- 2.66 2.4     NuStep   Level -- 1 -- 4 4   SPM -- -- -- 80 --   Minutes -- 15 -- 15 15   METs -- 1.6 -- 2.1 2.9     Recumbant Elliptical   Level -- -- -- -- 1.5   Minutes -- -- -- -- 30   METs -- -- -- -- 1.8     Home Exercise Plan   Plans to continue exercise at -- -- Home (comment)  walking -- Home (comment)  walking   Frequency -- -- Add 2 additional days to program exercise sessions. -- Add 2 additional days to program exercise sessions.   Initial Home Exercises Provided -- -- 12/29/19 -- 12/29/19   Row Name 01/28/20 1300 02/10/20 1600           Response to  Exercise   Blood Pressure (Admit) 114/64 110/64      Blood Pressure (Exercise) 130/70 162/70      Blood Pressure (Exit) 122/70 130/68      Heart Rate (Admit) 59 bpm 69 bpm      Heart Rate (Exercise) 99 bpm 108 bpm      Heart Rate (Exit) 54 bpm 52 bpm      Rating of Perceived Exertion (Exercise) 12 12      Symptoms none none      Duration Continue with 30 min of aerobic exercise without signs/symptoms of physical distress. Continue with 30 min of aerobic exercise without signs/symptoms of physical distress.      Intensity THRR unchanged THRR unchanged        Progression   Progression Continue to progress workloads to maintain intensity without signs/symptoms of physical distress. Continue to progress workloads to maintain intensity without signs/symptoms of physical distress.      Average METs 2.3 2.6        Resistance Training   Training Prescription Yes Yes      Weight 5 lb 5 lb      Reps 10-15 10-15        Interval  Training   Interval Training No No        Recumbant Bike   Level 2 2      RPM 60 --      Minutes 15 15      METs 2.66 --        NuStep   Level 4 5      SPM 80 --      Minutes 15 15      METs 2 2.8        Biostep-RELP   Level -- 2      Minutes -- 15      METs -- 3        Home Exercise Plan   Plans to continue exercise at Home (comment)  walking Home (comment)  walking      Frequency Add 2 additional days to program exercise sessions. Add 2 additional days to program exercise sessions.      Initial Home Exercises Provided 12/29/19 12/29/19             Exercise Comments:   Exercise Goals and Review:  Exercise Goals    Row Name 12/11/19 1510             Exercise Goals   Increase Physical Activity Yes       Intervention Provide advice, education, support and counseling about physical activity/exercise needs.;Develop an individualized exercise prescription for aerobic and resistive training based on initial evaluation findings, risk stratification, comorbidities and participant's personal goals.       Expected Outcomes Short Term: Attend rehab on a regular basis to increase amount of physical activity.;Long Term: Add in home exercise to make exercise part of routine and to increase amount of physical activity.;Long Term: Exercising regularly at least 3-5 days a week.       Increase Strength and Stamina Yes       Intervention Provide advice, education, support and counseling about physical activity/exercise needs.;Develop an individualized exercise prescription for aerobic and resistive training based on initial evaluation findings, risk stratification, comorbidities and participant's personal goals.       Expected Outcomes Short Term: Increase workloads from initial exercise prescription for resistance, speed, and METs.;Short Term: Perform resistance training exercises routinely during rehab and add in resistance training at  home;Long Term: Improve cardiorespiratory fitness,  muscular endurance and strength as measured by increased METs and functional capacity (6MWT)       Able to understand and use rate of perceived exertion (RPE) scale Yes       Intervention Provide education and explanation on how to use RPE scale       Expected Outcomes Short Term: Able to use RPE daily in rehab to express subjective intensity level;Long Term:  Able to use RPE to guide intensity level when exercising independently       Able to understand and use Dyspnea scale Yes       Intervention Provide education and explanation on how to use Dyspnea scale       Expected Outcomes Short Term: Able to use Dyspnea scale daily in rehab to express subjective sense of shortness of breath during exertion;Long Term: Able to use Dyspnea scale to guide intensity level when exercising independently       Knowledge and understanding of Target Heart Rate Range (THRR) Yes       Intervention Provide education and explanation of THRR including how the numbers were predicted and where they are located for reference       Expected Outcomes Short Term: Able to state/look up THRR;Short Term: Able to use daily as guideline for intensity in rehab;Long Term: Able to use THRR to govern intensity when exercising independently       Able to check pulse independently Yes       Intervention Provide education and demonstration on how to check pulse in carotid and radial arteries.;Review the importance of being able to check your own pulse for safety during independent exercise       Expected Outcomes Short Term: Able to explain why pulse checking is important during independent exercise;Long Term: Able to check pulse independently and accurately       Understanding of Exercise Prescription Yes       Intervention Provide education, explanation, and written materials on patient's individual exercise prescription       Expected Outcomes Short Term: Able to explain program exercise prescription;Long Term: Able to explain home  exercise prescription to exercise independently              Exercise Goals Re-Evaluation :  Exercise Goals Re-Evaluation    Row Name 12/15/19 1114 12/29/19 1115 01/14/20 1353 01/19/20 1129 01/28/20 1322     Exercise Goal Re-Evaluation   Exercise Goals Review Increase Physical Activity;Able to understand and use rate of perceived exertion (RPE) scale;Knowledge and understanding of Target Heart Rate Range (THRR);Understanding of Exercise Prescription;Increase Strength and Stamina;Able to check pulse independently Increase Physical Activity;Able to understand and use rate of perceived exertion (RPE) scale;Knowledge and understanding of Target Heart Rate Range (THRR);Understanding of Exercise Prescription;Increase Strength and Stamina;Able to check pulse independently Increase Physical Activity;Increase Strength and Stamina;Understanding of Exercise Prescription Increase Physical Activity;Increase Strength and Stamina;Understanding of Exercise Prescription Increase Physical Activity;Increase Strength and Stamina;Understanding of Exercise Prescription   Comments Reviewed RPE and dyspnea scales, THR and program prescription with pt today.  Pt voiced understanding and was given a copy of goals to take home. Clair Gulling is off to a good start in rehab. He is already walking for about 10-15 min at home.  We talked about extending that time to 30 min. Reviewed home exercise with pt today.  Pt plans to walking for exercise.  Reviewed THR, pulse, RPE, sign and symptoms, pulse oximetery and when to call 911 or MD.  Also  discussed weather considerations and indoor options.  Pt voiced understanding. Clair Gulling is doing well in rehab.  He has not been doing the treadmill recently but getting in 30 min on the recumbent elliptical!  We will continue to encourage him push for the proper pace on there and continue to monitor his progress. Clair Gulling attends consistently and hasnt missed a day yet!  he has noticed he can walk further than he  used to and doesnt get as tired. Clair Gulling does work at [employer] although his HR doesnt reach THR range.  Staff will encourage him to continue to progress workloads.   Expected Outcomes Short: Use RPE daily to regulate intensity. Long: Follow program prescription in THR. Short: Extend time of walking Long: Continue to improve stamina. Short: Improve pace on recumbent ellipitical  Long: Continue to improve stamina. Short: continue to attend consistently Long:  improve overall stamina Short: add resistance to seated machines Long: improve MET level   Row Name 02/10/20 1625             Exercise Goal Re-Evaluation   Exercise Goals Review Increase Physical Activity;Increase Strength and Stamina;Understanding of Exercise Prescription       Comments Laverna Peace is doing well in rehab.  He improved his post 6MWT by 156ft.  He is planning to continue to exericse by walking at home after graduation.  We will continue to monitor his progress.       Expected Outcomes Short: Graduate Long: Continue to exercise independently              Discharge Exercise Prescription (Final Exercise Prescription Changes):  Exercise Prescription Changes - 02/10/20 1600      Response to Exercise   Blood Pressure (Admit) 110/64    Blood Pressure (Exercise) 162/70    Blood Pressure (Exit) 130/68    Heart Rate (Admit) 69 bpm    Heart Rate (Exercise) 108 bpm    Heart Rate (Exit) 52 bpm    Rating of Perceived Exertion (Exercise) 12    Symptoms none    Duration Continue with 30 min of aerobic exercise without signs/symptoms of physical distress.    Intensity THRR unchanged      Progression   Progression Continue to progress workloads to maintain intensity without signs/symptoms of physical distress.    Average METs 2.6      Resistance Training   Training Prescription Yes    Weight 5 lb    Reps 10-15      Interval Training   Interval Training No      Recumbant Bike   Level 2    Minutes 15      NuStep   Level 5     Minutes 15    METs 2.8      Biostep-RELP   Level 2    Minutes 15    METs 3      Home Exercise Plan   Plans to continue exercise at Home (comment)   walking   Frequency Add 2 additional days to program exercise sessions.    Initial Home Exercises Provided 12/29/19           Nutrition:  Target Goals: Understanding of nutrition guidelines, daily intake of sodium '1500mg'$ , cholesterol '200mg'$ , calories 30% from fat and 7% or less from saturated fats, daily to have 5 or more servings of fruits and vegetables.  Education: Controlling Sodium/Reading Food Labels -Group verbal and written material supporting the discussion of sodium use in heart healthy nutrition. Review and explanation  with models, verbal and written materials for utilization of the food label.   Education: General Nutrition Guidelines/Fats and Fiber: -Group instruction provided by verbal, written material, models and posters to present the general guidelines for heart healthy nutrition. Gives an explanation and review of dietary fats and fiber.   Cardiac Rehab from 02/11/2020 in [facility] Cardiac and Pulmonary Rehab  Date 02/11/20  Educator [facility]  Instruction Review Code 1- Verbalizes Understanding      Biometrics:  Pre Biometrics - 12/11/19 1520      Pre Biometrics   Height 5\' 7"  (1.702 m)    Weight 258 lb 14.4 oz (117.4 kg)    BMI (Calculated) 40.54    Single Leg Stand 2.9 seconds           Post Biometrics - 02/06/20 1124       Post  Biometrics   Height 5\' 7"  (1.702 m)    Weight 261 lb 6.4 oz (118.6 kg)    BMI (Calculated) 40.93           Nutrition Therapy Plan and Nutrition Goals:  Nutrition Therapy & Goals - 12/26/19 0730      Nutrition Therapy   Diet Heart healthy, low sodium    Drug/Food Interactions Statins/Certain Fruits    Protein (specify units) 95-100g    Fiber 30 grams    Whole Grain Foods 3 servings    Saturated Fats 12 max. grams    Fruits and Vegetables 5 servings/day    Sodium 1.5 grams       Personal Nutrition Goals   Nutrition Goal LT: Strengthen heart, strengthen legs    Comments Retired so no current schedule. Couple pieces of bacon with eggs (no oil). L: chicken (grilled or boiled) alone or with sandwich, beans or small potato with some kind of vegetables. S: [place] rolled up . Drinks water or crystal lite. D: hamburger steak or ham - boiled or baked or fish. beans or small potato with some kind of vegetables. Seldom eats fried foods, rarely has soft drinks. Has his own garden. doesn't east much red meat. Discussed heart healthy eating. Pt would like to review paperwork.      Intervention Plan   Intervention Prescribe, educate and counsel regarding individualized specific dietary modifications aiming towards targeted core components such as weight, hypertension, lipid management, diabetes, heart failure and other comorbidities.;Nutrition handout(s) given to patient.    Expected Outcomes Short Term Goal: Understand basic principles of dietary content, such as calories, fat, sodium, cholesterol and nutrients.;Short Term Goal: A plan has been developed with personal nutrition goals set during dietitian appointment.;Long Term Goal: Adherence to prescribed nutrition plan.           Nutrition Assessments:  Nutrition Assessments - 02/11/20 1130      MEDFICTS Scores   Post Score 70           MEDIFICTS Score Key:          ?70 Need to make dietary changes          40-70 Heart Healthy Diet         ? 40 Therapeutic Level Cholesterol Diet  Nutrition Goals Re-Evaluation:  Nutrition Goals Re-Evaluation    [place] Name 01/19/20 1136             Goals   Nutrition Goal ST: Pt does not want to make any chnages at this time LT: Strengthen heart, strengthen legs       Comment Pt doesn't have any current questions  or concerns at this time and would like to continue with current diet.       Expected Outcome ST: Pt does not want to make any chnages at this time LT: Strengthen heart,  strengthen legs              Nutrition Goals Discharge (Final Nutrition Goals Re-Evaluation):  Nutrition Goals Re-Evaluation - 01/19/20 1136      Goals   Nutrition Goal ST: Pt does not want to make any chnages at this time LT: Strengthen heart, strengthen legs    Comment Pt doesn't have any current questions or concerns at this time and would like to continue with current diet.    Expected Outcome ST: Pt does not want to make any chnages at this time LT: Strengthen heart, strengthen legs           Psychosocial: Target Goals: Acknowledge presence or absence of significant depression and/or stress, maximize coping skills, provide positive support system. Participant is able to verbalize types and ability to use techniques and skills needed for reducing stress and depression.   Education: Depression - Provides group verbal and written instruction on the correlation between heart/lung disease and depressed mood, treatment options, and the stigmas associated with seeking treatment.   Cardiac Rehab from 02/11/2020 in [facility] Cardiac and Pulmonary Rehab  Date 01/07/20  Educator [facility]  Instruction Review Code 1- [employer] Understanding      Education: Sleep Hygiene -Provides group verbal and written instruction about how sleep can affect your health.  Define sleep hygiene, discuss sleep cycles and impact of sleep habits. Review good sleep hygiene tips.     Education: Stress and Anxiety: - Provides group verbal and written instruction about the health risks of elevated stress and causes of high stress.  Discuss the correlation between heart/lung disease and anxiety and treatment options. Review healthy ways to manage with stress and anxiety.   Cardiac Rehab from 02/11/2020 in [facility] Cardiac and Pulmonary Rehab  Date 01/07/20  Educator [facility]  Instruction Review Code 1- Verbalizes Understanding       Initial Review & Psychosocial Screening:  Initial Psych Review & Screening - 12/04/19 1438       Initial Review   Current issues with Current Psychotropic Meds;None Identified      Family Dynamics   Good Support System? Yes    Comments He can look to his wife, daughter and grandson for support.      Barriers   Psychosocial barriers to participate in program There are no identifiable barriers or psychosocial needs.;The patient should benefit from training in stress management and relaxation.      Screening Interventions   Interventions Encouraged to exercise;Provide feedback about the scores to participant;To provide support and resources with identified psychosocial needs    Expected Outcomes Short Term goal: Utilizing psychosocial counselor, staff and physician to assist with identification of specific Stressors or current issues interfering with healing process. Setting desired goal for each stressor or current issue identified.;Long Term Goal: Stressors or current issues are controlled or eliminated.;Short Term goal: Identification and review with participant of any Quality of Life or Depression concerns found by scoring the questionnaire.;Long Term goal: The participant improves quality of Life and PHQ9 Scores as seen by post scores and/or verbalization of changes           Quality of Life Scores:   Quality of Life - 02/11/20 1127      Quality of Life Scores   Health/Function Pre 24.43 %  Health/Function Post 26.64 %    Health/Function % Change 9.05 %    Socioeconomic Pre 25.48 %    Socioeconomic Post 30 %    Socioeconomic % Change  17.74 %    Psych/Spiritual Pre 28.57 %    Psych/Spiritual Post 27.86 %    Psych/Spiritual % Change -2.49 %    Family Pre 30 %    Family Post 30 %    Family % Change 0 %    GLOBAL Pre 26.22 %    GLOBAL Post 28.06 %    GLOBAL % Change 7.02 %          Scores of 19 and below usually indicate a poorer quality of life in these areas.  A difference of  2-3 points is a clinically meaningful difference.  A difference of 2-3 points in the  total score of the Quality of Life Index has been associated with significant improvement in overall quality of life, self-image, physical symptoms, and general health in studies assessing change in quality of life.  PHQ-9: Recent Review Flowsheet Data    Depression screen [facility] 2/9 02/11/2020 12/11/2019 10/10/2016 10/10/2016   Decreased Interest 0 0 0 0   Down, Depressed, Hopeless 0 0 0 0   PHQ - 2 Score 0 0 0 0   Altered sleeping 0 0 0 -   Tired, decreased energy 0 0 0 -   Change in appetite 0 0 0 -   Feeling bad or failure about yourself  0 0 0 -   Trouble concentrating 0 0 0 -   Moving slowly or fidgety/restless 0 0 0 -   Suicidal thoughts 0 0 0 -   PHQ-9 Score 0 0 0 -     Interpretation of Total Score  Total Score Depression Severity:  1-4 = Minimal depression, 5-9 = Mild depression, 10-14 = Moderate depression, 15-19 = Moderately severe depression, 20-27 = Severe depression   Psychosocial Evaluation and Intervention:  Psychosocial Evaluation - 12/04/19 1439      Psychosocial Evaluation & Interventions   Interventions Encouraged to exercise with the program and follow exercise prescription    Comments He can look to his wife, daughter and grandson for support. Patient did the [facility] program about 7 or eight years ago. He is feeling positive about his health and is ready for Rehab.    Continue Psychosocial Services  Follow up required by staff           Psychosocial Re-Evaluation:  Psychosocial Re-Evaluation    [place] Name 12/29/19 1119 01/19/20 1127           Psychosocial Re-Evaluation   Current issues with Current Psychotropic Meds Current Sleep Concerns;Current Stress Concerns      Comments Clair Gulling is doing well in rehab.  His wife is joining [employer] while he is coming to rehab.  He denies any current symptims of depression or anxiety.  He is feeling good.  He enjoys coming to rehab and to get out and about.  They have a new dog next door and his is not a fan.  They are  getting better with each other. Jim's wife is still going to [employer].  He stays up late at night but sleeps 6-8 hours at night.  He feels rested.      Expected Outcomes Short: Continue to exercise to build stamina Long: Continue to stay positive. Short: continue to attend Cardiac Rehab for the exercise Long: maintain positive attitude  Interventions Encouraged to attend Cardiac Rehabilitation for the exercise --      Continue Psychosocial Services  Follow up required by staff --             Psychosocial Discharge (Final Psychosocial Re-Evaluation):  Psychosocial Re-Evaluation - 01/19/20 1127      Psychosocial Re-Evaluation   Current issues with Current Sleep Concerns;Current Stress Concerns    Comments Jim's wife is still going to [employer].  He stays up late at night but sleeps 6-8 hours at night.  He feels rested.    Expected Outcomes Short: continue to attend Cardiac Rehab for the exercise Long: maintain positive attitude           Vocational Rehabilitation: Provide vocational rehab assistance to qualifying candidates.   Vocational Rehab Evaluation & Intervention:   Education: Education Goals: Education classes will be provided on a variety of topics geared toward better understanding of heart health and risk factor modification. Participant will state understanding/return demonstration of topics presented as noted by education test scores.  Learning Barriers/Preferences:   General Cardiac Education Topics:  AED/CPR: - Group verbal and written instruction with the use of models to demonstrate the basic use of the AED with the basic ABC's of resuscitation.   Anatomy & Physiology of the Heart: - Group verbal and written instruction and models provide basic cardiac anatomy and physiology, with the coronary electrical and arterial systems. Review of Valvular disease and Heart Failure   Cardiac Procedures: - Group verbal and written instruction to review  commonly prescribed medications for heart disease. Reviews the medication, class of the drug, and side effects. Includes the steps to properly store meds and maintain the prescription regimen. (beta blockers and nitrates)   Cardiac Rehab from 02/11/2020 in [facility] Cardiac and Pulmonary Rehab  Date 01/28/20  Educator [facility]  Instruction Review Code 1- Verbalizes Understanding      Cardiac Medications I: - Group verbal and written instruction to review commonly prescribed medications for heart disease. Reviews the medication, class of the drug, and side effects. Includes the steps to properly store meds and maintain the prescription regimen.   Cardiac Rehab from 02/11/2020 in [facility] Cardiac and Pulmonary Rehab  Date 12/31/19  Educator SB  Instruction Review Code 1- Verbalizes Understanding      Cardiac Medications II: -Group verbal and written instruction to review commonly prescribed medications for heart disease. Reviews the medication, class of the drug, and side effects. (all other drug classes)   Cardiac Rehab from 02/11/2020 in [facility] Cardiac and Pulmonary Rehab  Date 01/14/20  Educator [facility]  Instruction Review Code 1- Verbalizes Understanding       Go Sex-Intimacy & Heart Disease, Get SMART - Goal Setting: - Group verbal and written instruction through game format to discuss heart disease and the return to sexual intimacy. Provides group verbal and written material to discuss and apply goal setting through the application of the S.M.A.R.T. Method.   Cardiac Rehab from 02/11/2020 in [facility] Cardiac and Pulmonary Rehab  Date 01/28/20  Educator [facility]  Instruction Review Code 1- Verbalizes Understanding      Other Matters of the Heart: - Provides group verbal, written materials and models to describe Stable Angina and Peripheral Artery. Includes description of the disease process and treatment options available to the cardiac patient.   Infection Prevention: - Provides verbal and written material to  individual with discussion of infection control including proper hand washing and proper equipment cleaning during exercise session.   Cardiac Rehab  from 02/11/2020 in [facility] Cardiac and Pulmonary Rehab  Date 12/11/19  Educator AS  Instruction Review Code 1- Verbalizes Understanding      Falls Prevention: - Provides verbal and written material to individual with discussion of falls prevention and safety.   Cardiac Rehab from 02/11/2020 in [facility] Cardiac and Pulmonary Rehab  Date 12/11/19  Educator AS  Instruction Review Code 1- Verbalizes Understanding      Other: -Provides group and verbal instruction on various topics (see comments)   Knowledge Questionnaire Score:  Knowledge Questionnaire Score - 02/11/20 1129      Knowledge Questionnaire Score   Post Score 26/26           Core Components/Risk Factors/Patient Goals at Admission:  Personal Goals and Risk Factors at Admission - 12/11/19 1514      Core Components/Risk Factors/Patient Goals on Admission    Weight Management Yes;Weight Loss    Intervention Weight Management: Develop a combined nutrition and exercise program designed to reach desired caloric intake, while maintaining appropriate intake of nutrient and fiber, sodium and fats, and appropriate energy expenditure required for the weight goal.;Weight Management: Provide education and appropriate resources to help participant work on and attain dietary goals.;Weight Management/Obesity: Establish reasonable short term and long term weight goals.    Admit Weight 258 lb 14.4 oz (117.4 kg)    Goal Weight: Short Term 250 lb (113.4 kg)    Goal Weight: Long Term 245 lb (111.1 kg)    Expected Outcomes Short Term: Continue to assess and modify interventions until short term weight is achieved;Long Term: Adherence to nutrition and physical activity/exercise program aimed toward attainment of established weight goal;Weight Maintenance: Understanding of the daily nutrition guidelines,  which includes 25-35% calories from fat, 7% or less cal from saturated fats, less than $RemoveB'200mg'ZAPWYPtQ$  cholesterol, less than 1.5gm of sodium, & 5 or more servings of fruits and vegetables daily;Weight Loss: Understanding of general recommendations for a balanced deficit meal plan, which promotes 1-2 lb weight loss per week and includes a negative energy balance of [phone] kcal/d;Understanding recommendations for meals to include 15-35% energy as protein, 25-35% energy from fat, 35-60% energy from carbohydrates, less than $RemoveB'200mg'HomlhsID$  of dietary cholesterol, 20-35 gm of total fiber daily    Heart Failure Yes    Intervention Provide a combined exercise and nutrition program that is supplemented with education, support and counseling about heart failure. Directed toward relieving symptoms such as shortness of breath, decreased exercise tolerance, and extremity edema.    Expected Outcomes Improve functional capacity of life;Short term: Attendance in program 2-3 days a week with increased exercise capacity. Reported lower sodium intake. Reported increased fruit and vegetable intake. Reports medication compliance.;Short term: Daily weights obtained and reported for increase. Utilizing diuretic protocols set by physician.;Long term: Adoption of self-care skills and reduction of barriers for early signs and symptoms recognition and intervention leading to self-care maintenance.    Intervention Provide education on lifestyle modifcations including regular physical activity/exercise, weight management, moderate sodium restriction and increased consumption of fresh fruit, vegetables, and low fat dairy, alcohol moderation, and smoking cessation.;Monitor prescription use compliance.    Expected Outcomes Short Term: Continued assessment and intervention until BP is < 140/20mm HG in hypertensive participants. < 130/34mm HG in hypertensive participants with diabetes, heart failure or chronic kidney disease.;Long Term: Maintenance of blood  pressure at goal levels.    Intervention Provide education and support for participant on nutrition & aerobic/resistive exercise along with prescribed medications to achieve LDL '70mg'$ , HDL >$Remo'40mg'EERKf$ .  Expected Outcomes Short Term: Participant states understanding of desired cholesterol values and is compliant with medications prescribed. Participant is following exercise prescription and nutrition guidelines.;Long Term: Cholesterol controlled with medications as prescribed, with individualized exercise RX and with personalized nutrition plan. Value goals: LDL < $Rem'70mg'IAbD$ , HDL > 40 mg.           Education:Diabetes - Individual verbal and written instruction to review signs/symptoms of diabetes, desired ranges of glucose level fasting, after meals and with exercise. Acknowledge that pre and post exercise glucose checks will be done for 3 sessions at entry of program.   Education: Know Your Numbers and Risk Factors: -Group verbal and written instruction about important numbers in your health.  Discussion of what are risk factors and how they play a role in the disease process.  Review of Cholesterol, Blood Pressure, Diabetes, and BMI and the role they play in your overall health.   Cardiac Rehab from 02/11/2020 in [facility] Cardiac and Pulmonary Rehab  Date 01/14/20  Educator [facility]  Instruction Review Code 1- Verbalizes Understanding      Core Components/Risk Factors/Patient Goals Review:   Goals and Risk Factor Review    Row Name 12/29/19 1121 01/19/20 1125           Core Components/Risk Factors/Patient Goals Review   Personal Goals Review Weight Management/Obesity;Heart Failure;Hypertension;Lipids Weight Management/Obesity;Heart Failure;Hypertension;Lipids      Review Clair Gulling is doing well in rehab.  He is off to a good start.  His weight has been stable for the most part.  He denies any heart failure symptoms.  We reviewed heart failure zones and handout given.  His blood pressures have been good and he  checks them regularly at home.  He is doing well on his medications as well. Clair Gulling reports his weight is steady.  He is taking all medicatins as directed.  He denies HF symptoms.  He continues to monitor BP at home.      Expected Outcomes Short: Review heart failure handout  Long; Continue to work on weight loss and monitoring risk factors. Short: continue to monitor risk factors Long:  manage risk factors long term             Core Components/Risk Factors/Patient Goals at Discharge (Final Review):   Goals and Risk Factor Review - 01/19/20 1125      Core Components/Risk Factors/Patient Goals Review   Personal Goals Review Weight Management/Obesity;Heart Failure;Hypertension;Lipids    Review Clair Gulling reports his weight is steady.  He is taking all medicatins as directed.  He denies HF symptoms.  He continues to monitor BP at home.    Expected Outcomes Short: continue to monitor risk factors Long:  manage risk factors long term           ITP Comments:  ITP Comments    Row Name 12/04/19 1441 12/11/19 1519 12/15/19 1111 12/17/19 0608 01/14/20 0551   ITP Comments Virtual Visit completed. Patient informed on EP and RD appointment and 6 Minute walk test. Patient also informed of patient health questionnaires on My Chart. Patient Verbalizes understanding. Visit diagnosis can be found in [facility] 11/09/2019. Completed 6MWT and gym orientation. Initial ITP created and sent for review to Dr. Emily Filbert, Medical Director. First full day of exercise!  Patient was oriented to gym and equipment including functions, settings, policies, and procedures.  Patient's individual exercise prescription and treatment plan were reviewed.  All starting workloads were established based on the results of the 6 minute walk test done at  initial orientation visit.  The plan for exercise progression was also introduced and progression will be customized based on patient's performance and goals 30 Day review completed. Medical Director ITP  review done, changes made as directed, and signed approval by Medical Director.  New to program 30 Day review completed. Medical Director ITP review done, changes made as directed, and signed approval by Medical Director.   [place] Name 02/11/20 1439 02/16/20 1100         ITP Comments 30 day review completed. ITP sent to Dr. Emily Filbert, Medical Director of Cardiac and Pulmonary Rehab. Continue with ITP unless changes are made by physician. Salmaan graduated today from  rehab with 36 sessions completed.  Details of the patient's exercise prescription and what He needs to do in order to continue the prescription and progress were discussed with patient.  Patient was given a copy of prescription and goals.  Patient verbalized understanding.  Rowdy plans to continue to exercise by walking.             Comments: Discharge ITP

## 2020-02-16 NOTE — Progress Notes (Signed)
Daily Session Note  Patient Details  Name: William Armstrong MRN: [id number] Date of Birth: [birth year] Referring Provider:     Cardiac Rehab from 12/11/2019 in [facility] Cardiac and Pulmonary Rehab  Referring Provider Paraschos      Encounter Date: 02/16/2020  Check In:  Session Check In - 02/16/20 1058      Check-In   Supervising physician immediately available to respond to emergencies See telemetry face sheet for immediately available ER MD    Location [facility]-Cardiac & Pulmonary Rehab    Staff Present William Papa, RN BSN;William Armstrong, [place] Exercise Physiologist;William Armstrong, [place], ACSM CEP, Exercise Physiologist    Virtual Visit No    Medication changes reported     No    Fall or balance concerns reported    No    Warm-up and Cool-down Performed on first and last piece of equipment    Resistance Training Performed Yes    VAD Patient? No    PAD/SET Patient? No      Pain Assessment   Currently in Pain? No/denies              Social History   Tobacco Use  Smoking Status Never Smoker  Smokeless Tobacco Never Used    Goals Met:  Independence with exercise equipment Exercise tolerated well No report of cardiac concerns or symptoms Strength training completed today  Goals Unmet:  Not Applicable  Comments:  William Armstrong graduated today from  rehab with 36 sessions completed.  Details of the patient's exercise prescription and what He needs to do in order to continue the prescription and progress were discussed with patient.  Patient was given a copy of prescription and goals.  Patient verbalized understanding.  William Armstrong plans to continue to exercise by walking.    Dr. Emily Filbert is Medical Director for [place] [facility].

## 2020-02-16 NOTE — Progress Notes (Signed)
Discharge Progress Report  Patient Details  Name: William Armstrong MRN: [id number] Date of Birth: [birth year] Referring Provider:     Cardiac Rehab from 12/11/2019 in [facility]  Referring Provider Paraschos       Number of Visits: 43  Reason for Discharge:  Patient reached a stable level of exercise. Patient independent in their exercise. Patient has met program and personal goals.  Smoking History:  Social History   Tobacco Use  Smoking Status Never Smoker  Smokeless Tobacco Never Used    Diagnosis:  NSTEMI (non-ST elevated myocardial infarction) ([place])  ADL UCSD:   Initial Exercise Prescription:  Initial Exercise Prescription - 12/11/19 1500      Date of Initial Exercise RX and Referring Provider   Date 12/11/19    Referring Provider Paraschos      Treadmill   MPH 1    Grade 0    Minutes 15    METs 1.77      Recumbant Bike   Level 1    RPM 60    Minutes 15    METs 1.5      NuStep   Level 1    SPM 80    Minutes 15    METs 1.5      T5 Nustep   Level 1    SPM 80    Minutes 15    METs 1.5      Prescription Details   Frequency (times per week) 3    Duration Progress to 30 minutes of continuous aerobic without signs/symptoms of physical distress      Intensity   THRR 40-80% of Max Heartrate 104-134    Ratings of Perceived Exertion 11-13    Perceived Dyspnea 0-4      Resistance Training   Training Prescription Yes    Weight 5 lb    Reps 10-15           Discharge Exercise Prescription (Final Exercise Prescription Changes):  Exercise Prescription Changes - 02/10/20 1600      Response to Exercise   Blood Pressure (Admit) 110/64    Blood Pressure (Exercise) 162/70    Blood Pressure (Exit) 130/68    Heart Rate (Admit) 69 bpm    Heart Rate (Exercise) 108 bpm    Heart Rate (Exit) 52 bpm    Rating of Perceived Exertion (Exercise) 12    Symptoms none    Duration Continue with 30 min of aerobic exercise without  signs/symptoms of physical distress.    Intensity THRR unchanged      Progression   Progression Continue to progress workloads to maintain intensity without signs/symptoms of physical distress.    Average METs 2.6      Resistance Training   Training Prescription Yes    Weight 5 lb    Reps 10-15      Interval Training   Interval Training No      Recumbant Bike   Level 2    Minutes 15      NuStep   Level 5    Minutes 15    METs 2.8      Biostep-RELP   Level 2    Minutes 15    METs 3      Home Exercise Plan   Plans to continue exercise at Home (comment)   walking   Frequency Add 2 additional days to program exercise sessions.    Initial Home Exercises Provided 12/29/19  Functional Capacity:  6 Minute Walk    Row Name 12/11/19 1504 02/06/20 1123       6 Minute Walk   Phase Initial Discharge    Distance 1000 feet 1190 feet    Distance % Change -- 19 %    Distance Feet Change -- 190 ft    Walk Time 6 minutes 6 minutes    # of Rest Breaks 0 0    MPH 1.9 2.25    METS 1.5 2.19    RPE -- 13    VO2 Peak 5.37 7.69    Symptoms No No    Resting HR 75 bpm 65 bpm    Resting BP 124/64 110/64    Resting Oxygen Saturation  95 % --    Exercise Oxygen Saturation  during 6 min walk 95 % --    Max Ex. HR 85 bpm 108 bpm    Max Ex. BP 150/64 162/70    2 Minute Post BP 122/62 --           Psychological, QOL, Others - Outcomes: PHQ 2/9: Depression screen [facility] 2/9 02/11/2020 12/11/2019 10/10/2016 10/10/2016  Decreased Interest 0 0 0 0  Down, Depressed, Hopeless 0 0 0 0  PHQ - 2 Score 0 0 0 0  Altered sleeping 0 0 0 -  Tired, decreased energy 0 0 0 -  Change in appetite 0 0 0 -  Feeling bad or failure about yourself  0 0 0 -  Trouble concentrating 0 0 0 -  Moving slowly or fidgety/restless 0 0 0 -  Suicidal thoughts 0 0 0 -  PHQ-9 Score 0 0 0 -    Quality of Life:  Quality of Life - 02/11/20 1127      Quality of Life Scores   Health/Function Pre 24.43 %     Health/Function Post 26.64 %    Health/Function % Change 9.05 %    Socioeconomic Pre 25.48 %    Socioeconomic Post 30 %    Socioeconomic % Change  17.74 %    Psych/Spiritual Pre 28.57 %    Psych/Spiritual Post 27.86 %    Psych/Spiritual % Change -2.49 %    Family Pre 30 %    Family Post 30 %    Family % Change 0 %    GLOBAL Pre 26.22 %    GLOBAL Post 28.06 %    GLOBAL % Change 7.02 %           Nutrition & Weight - Outcomes:  Pre Biometrics - 12/11/19 1520      Pre Biometrics   Height 5' 7" (1.702 m)    Weight 258 lb 14.4 oz (117.4 kg)    BMI (Calculated) 40.54    Single Leg Stand 2.9 seconds           Post Biometrics - 02/06/20 1124       Post  Biometrics   Height 5' 7" (1.702 m)    Weight 261 lb 6.4 oz (118.6 kg)    BMI (Calculated) 40.93           Nutrition:  Nutrition Therapy & Goals - 12/26/19 0730      Nutrition Therapy   Diet Heart healthy, low sodium    Drug/Food Interactions Statins/Certain Fruits    Protein (specify units) 95-100g    Fiber 30 grams    Whole Grain Foods 3 servings    Saturated Fats 12 max. grams    Fruits and Vegetables 5  servings/day    Sodium 1.5 grams      Personal Nutrition Goals   Nutrition Goal LT: Strengthen heart, strengthen legs    Comments Retired so no current schedule. Couple pieces of bacon with eggs (no oil). L: chicken (grilled or boiled) alone or with sandwich, beans or small potato with some kind of vegetables. S: [place] rolled up . Drinks water or crystal lite. D: hamburger steak or ham - boiled or baked or fish. beans or small potato with some kind of vegetables. Seldom eats fried foods, rarely has soft drinks. Has his own garden. doesn't east much red meat. Discussed heart healthy eating. Pt would like to review paperwork.      Intervention Plan   Intervention Prescribe, educate and counsel regarding individualized specific dietary modifications aiming towards targeted core components such as weight,  hypertension, lipid management, diabetes, heart failure and other comorbidities.;Nutrition handout(s) given to patient.    Expected Outcomes Short Term Goal: Understand basic principles of dietary content, such as calories, fat, sodium, cholesterol and nutrients.;Short Term Goal: A plan has been developed with personal nutrition goals set during dietitian appointment.;Long Term Goal: Adherence to prescribed nutrition plan.           Nutrition Discharge:  Nutrition Assessments - 02/11/20 1130      MEDFICTS Scores   Post Score 70           Education Questionnaire Score:  Knowledge Questionnaire Score - 02/11/20 1129      Knowledge Questionnaire Score   Post Score 26/26           Goals reviewed with patient; copy given to patient.

## 2020-07-20 ENCOUNTER — Ambulatory Visit
Admission: RE | Admit: 2020-07-20 | Discharge: 2020-07-20 | Disposition: A | Discharge status: Home / Self Care | Payer: Medicare Other | Source: Ambulatory Visit | Attending: Urology | Admitting: Urology

## 2020-07-20 ENCOUNTER — Ambulatory Visit
Admission: RE | Admit: 2020-07-20 | Discharge: 2020-07-20 | Disposition: A | Discharge status: Home / Self Care | Payer: Medicare Other | Attending: Urology | Admitting: Urology

## 2020-07-20 ENCOUNTER — Other Ambulatory Visit: Payer: Self-pay

## 2020-07-20 DIAGNOSIS — N200 Calculus of kidney: Secondary | ICD-10-CM | POA: Insufficient documentation

## 2020-07-27 ENCOUNTER — Other Ambulatory Visit: Payer: Self-pay

## 2020-07-27 ENCOUNTER — Ambulatory Visit: Payer: Medicare Other | Admitting: Urology

## 2020-07-27 ENCOUNTER — Encounter: Payer: Self-pay | Admitting: Urology

## 2020-07-27 VITALS — BP 125/71 | HR 54 | Ht 67.5 in | Wt 271.0 lb

## 2020-07-27 DIAGNOSIS — R3915 Urgency of urination: Secondary | ICD-10-CM | POA: Diagnosis not present

## 2020-07-27 DIAGNOSIS — N200 Calculus of kidney: Secondary | ICD-10-CM | POA: Diagnosis not present

## 2020-07-27 NOTE — [place] Notes (Signed)
07/27/2020 3:22 PM   William Armstrong 1951-01-19 [id number]  Referring provider: [name], PA-C [address] [place],  [place] [address]  Chief Complaint  Patient presents with  . Other    Leakage of urine    HPI: 70 year old male with a personal history of recurrent nephrolithiasis returns today for routine annual follow-up with KUB.  He reports he went and got his x-ray last week on 07/22/2018.  A day or 2 later, he passed 3 small stones which he brings a picture of today along with a jar of stones he is passed over the past several years.  He had no associated pain with this.  He did have some mild hematuria when he passed the stone but this resolved quickly.  He continues to be very careful about his diet and fluid intake.  Previously underwent 86-PYPP urine metabolic evaluation.  KUB shows bilateral stones left greater than right.  In comparison to previous CT from 11/2019, these may have increased slightly.  There is also an object which the radiologist measured at 2 cm however based on the location as well as confirmation of this calcification, I am suspicious this may represent some other known stone objects such as a pill or something ingested.  He does note that he does take several large pills.  He is asymptomatic today, no flank pain.  He does report that he has some urinary urgency and frequency.  When he has to go, he is to get to the bathroom quite quickly because he worries about having an accident.  This is becoming increasingly bothersome.  He denies any dysuria.  No obstructive urinary symptoms including no weak stream or sensation of incomplete bladder emptying.   PMH: Past Medical History:  Diagnosis Date  . Anemia   . Arthritis   . BCC (basal cell carcinoma of skin)   . CHF (congestive heart failure) ([place])   . Complication of anesthesia    neck hurt for 2-3 days after general anesthesia  . COPD (chronic obstructive pulmonary disease) ([place])   .  Coronary artery disease   . Depression   . Glaucoma   . Heart attack ([place])    2013  . History of kidney stones   . HTN (hypertension)   . Hyperglycemia    pt denies  . Hypertension   . Hypokalemia   . Obesity    morbid  . Sleep apnea    osa, CPAP has been dc'd after 249 lb weight loss    Surgical History: Past Surgical History:  Procedure Laterality Date  . CORONARY STENT INTERVENTION N/A 11/10/2019   Procedure: CORONARY STENT INTERVENTION;  Surgeon: Isaias Cowman, MD;  Location: [facility];  Service: Cardiovascular;  Laterality: N/A;  . CORONARY STENT PLACEMENT  02/2012  . CYSTOSCOPY W/ RETROGRADES Left 11/18/2016   Procedure: CYSTOSCOPY WITH RETROGRADE PYELOGRAM;  Surgeon: Irine Seal, MD;  Location: [facility];  Service: Urology;  Laterality: Left;  . CYSTOSCOPY WITH STENT PLACEMENT Left 11/18/2016   Procedure: CYSTOSCOPY WITH STENT PLACEMENT;  Surgeon: Irine Seal, MD;  Location: [facility];  Service: Urology;  Laterality: Left;  . CYSTOSCOPY/URETEROSCOPY/HOLMIUM LASER/STENT PLACEMENT Left 11/29/2016   Procedure: CYSTOSCOPY/URETEROSCOPY/HOLMIUM LASER/STENT EXCHANGE;  Surgeon: Hollice Espy, MD;  Location: [facility];  Service: Urology;  Laterality: Left;  . CYSTOSCOPY/URETEROSCOPY/HOLMIUM LASER/STENT PLACEMENT Right 03/07/2017   Procedure: CYSTOSCOPY/URETEROSCOPY/HOLMIUM LASER/STENT PLACEMENT;  Surgeon: Hollice Espy, MD;  Location: [facility];  Service: Urology;  Laterality: Right;  . JOINT REPLACEMENT  right hip  . LAPAROSCOPIC GASTRIC RESTRICTIVE DUODENAL PROCEDURE (DUODENAL SWITCH)    . LAPAROSCOPIC GASTRIC SLEEVE RESECTION  08/21/2013  . LEFT HEART CATH AND CORONARY ANGIOGRAPHY N/A 11/10/2019   Procedure: LEFT HEART CATH AND CORONARY ANGIOGRAPHY;  Surgeon: Teodoro Spray, MD;  Location: [facility];  Service: Cardiovascular;  Laterality: N/A;  . skin removal surgery     removal of extra skin approx 20 lbs  . STONE EXTRACTION WITH BASKET Right 03/07/2017    Procedure: STONE EXTRACTION WITH BASKET;  Surgeon: Hollice Espy, MD;  Location: [facility];  Service: Urology;  Laterality: Right;  . TONSILLECTOMY AND ADENOIDECTOMY  1960  . TOTAL HIP ARTHROPLASTY Right 01/18/2017   Procedure: TOTAL HIP ARTHROPLASTY ANTERIOR APPROACH;  Surgeon: Hessie Knows, MD;  Location: [facility];  Service: Orthopedics;  Laterality: Right;    Home Medications:  Allergies as of 07/27/2020      Reactions   Atorvastatin Other (See Comments)   Muscle aches. Other reaction(s): Other (See Comments) Muscle aches. Muscle pain   Pregabalin Other (See Comments)   Muscle soreness Other reaction(s): Other (See Comments) Muscle soreness Severe muscle pain      Medication List       Accurate as of July 27, 2020  3:22 PM. If you have any questions, ask your nurse or doctor.        aspirin EC 81 MG tablet Take 81 mg by mouth daily.   clopidogrel 75 MG tablet Commonly known as: PLAVIX Take 1 tablet (75 mg total) by mouth daily with breakfast.   latanoprost 0.005 % ophthalmic solution Commonly known as: XALATAN Place 1 drop into both eyes at bedtime.   lisinopril 40 MG tablet Commonly known as: ZESTRIL Take 40 mg by mouth daily.   Potassium Citrate 15 MEQ (1620 MG) Tbcr Take 1 tablet by mouth 2 (two) times daily.   rosuvastatin 20 MG tablet Commonly known as: CRESTOR Take 1 tablet (20 mg total) by mouth daily.   rosuvastatin 10 MG tablet Commonly known as: CRESTOR Take 10 mg by mouth at bedtime.   sertraline 100 MG tablet Commonly known as: ZOLOFT Take 1 tablet (100 mg total) by mouth daily.   spironolactone 25 MG tablet Commonly known as: ALDACTONE Take 25 mg by mouth daily.   tamsulosin 0.4 MG Caps capsule Commonly known as: FLOMAX Take 1 capsule (0.4 mg total) by mouth daily.   Vitamin D 50 MCG (2000 UT) Caps Take 2,000 Units by mouth daily.       Allergies:  Allergies  Allergen Reactions  . Atorvastatin Other (See Comments)     Muscle aches. Other reaction(s): Other (See Comments) Muscle aches. Muscle pain  . Pregabalin Other (See Comments)    Muscle soreness Other reaction(s): Other (See Comments) Muscle soreness Severe muscle pain    Family History: Family History  Problem Relation Age of Onset  . Heart attack Mother   . Brain cancer Father   . Heart attack Sister   . Congenital heart disease Sister   . Leukemia Paternal Grandmother   . COPD Brother   . Heart disease Brother   . Prostate cancer Neg Hx   . Kidney cancer Neg Hx   . Bladder Cancer Neg Hx     Social History:  reports that he has never smoked. He has never used smokeless tobacco. He reports that he does not drink alcohol and does not use drugs.   Physical Exam: BP 125/71   Pulse (!) 54  Ht 5' 7.5" (1.715 m)   Wt 271 lb (122.9 kg)   BMI 41.82 kg/m   Constitutional:  Alert and oriented, No acute distress. HEENT: [place] AT, moist mucus membranes.  Trachea midline, no masses. Cardiovascular: No clubbing, cyanosis, or edema. Respiratory: Normal respiratory effort, no increased work of breathing. Skin: No rashes, bruises or suspicious lesions. Neurologic: Grossly intact, no focal deficits, moving all 4 extremities. Psychiatric: Normal mood and affect.  Laboratory Data: Lab Results  Component Value Date   WBC 7.4 11/11/2019   HGB 10.7 (L) 11/11/2019   HCT 32.3 (L) 11/11/2019   MCV 89.5 11/11/2019   PLT 168 11/11/2019    Lab Results  Component Value Date   CREATININE 0.46 (L) 11/11/2019     Pertinent Imaging: Results for orders placed during the hospital encounter of 07/20/20  Abdomen 1 view (KUB)  Narrative CLINICAL DATA:  Kidney stones.  EXAM: ABDOMEN - 1 VIEW  COMPARISON:  12/12/2019, CT 11/14/2018  FINDINGS: bilateral renal calculi. On the left this is increased in size currently measuring 2 cm projecting over the lower pole. Largest stone on the right measures 6 mm. No stones over the course of  the ureters. Multiple pelvic calcifications unchanged consistent with phleboliths. Normal bowel gas pattern with moderate volume of colonic stool. Enteric sutures are noted. Elevation of right hemidiaphragm. Right hip arthroplasty.  IMPRESSION: Bilateral renal calculi, left greater than right, have increased in size from prior CT.   Electronically Signed By: Keith Rake M.D. On: 07/22/2020 02:30  Above KUB imaging was personally reviewed today and directly.  To previous CT scan from 11/2019.  My personal interpretation is as per HPI.  Assessment & Plan:    1. Recurrent nephrolithiasis Bilateral nephrolithiasis-he has been able to pass stones of fairly significant size with ease  We will continue to follow him but would not plan for any intervention  There is a fairly large "stone" on his left but I suspect this is some sort of object in his bowel rather than a true stone based on the location, confirmation of the stone as well as when compared to previous CT scan less than a year ago.  This degree of interval growth would be unlikely.  We will plan for KUB again in 6 months to reassess  Encourage stone diet, not interested in repeating 24-hour urine or pharmacotherapy for stone prevention  2. Urinary urgency Likely multifactorial, discussed behavioral modification today although with the volume he needs to drink for stone prevention, this may be part of the issue  In addition to the above, we discussed pharmacotherapy.  He is not interested at this point but may consider if his symptoms worsen.  Follow-up in 6 months with KUB   Hollice Espy, MD  [facility] [address] [place], [place] [address] [phone]  I spent 30 total minutes on the day of the encounter including pre-visit review of the medical record, face-to-face time with the patient, and post visit ordering of labs/imaging/tests.

## 2020-08-31 ENCOUNTER — Other Ambulatory Visit: Payer: Self-pay | Admitting: Physician Assistant

## 2020-08-31 DIAGNOSIS — Z8659 Personal history of other mental and behavioral disorders: Secondary | ICD-10-CM

## 2020-11-29 ENCOUNTER — Other Ambulatory Visit: Payer: Self-pay

## 2020-11-29 ENCOUNTER — Ambulatory Visit
Admission: RE | Admit: 2020-11-29 | Discharge: 2020-11-29 | Disposition: A | Discharge status: Home / Self Care | Payer: Medicare Other | Source: Ambulatory Visit | Attending: Physician Assistant | Admitting: Physician Assistant

## 2020-11-29 ENCOUNTER — Ambulatory Visit
Admission: RE | Admit: 2020-11-29 | Discharge: 2020-11-29 | Disposition: A | Discharge status: Home / Self Care | Payer: Medicare Other | Attending: Physician Assistant | Admitting: Physician Assistant

## 2020-11-29 DIAGNOSIS — N200 Calculus of kidney: Secondary | ICD-10-CM | POA: Insufficient documentation

## 2020-11-29 NOTE — Telephone Encounter (Signed)
Appt made for 11/30/20 with Sam. Advised patient to get KUB prior and be prepared to give a urine sample. Patient verbalized understanding.

## 2020-11-30 ENCOUNTER — Ambulatory Visit: Payer: Medicare Other | Admitting: Physician Assistant

## 2020-11-30 VITALS — BP 129/78 | HR 60 | Ht 67.0 in | Wt 267.0 lb

## 2020-11-30 DIAGNOSIS — N200 Calculus of kidney: Secondary | ICD-10-CM | POA: Diagnosis not present

## 2020-11-30 LAB — URINALYSIS, COMPLETE
Bilirubin, UA: NEGATIVE
Glucose, UA: NEGATIVE
Ketones, UA: NEGATIVE
Leukocytes,UA: NEGATIVE
Nitrite, UA: NEGATIVE
Protein,UA: NEGATIVE
RBC, UA: NEGATIVE
Specific Gravity, UA: 1.025 (ref 1.005–1.030)
Urobilinogen, Ur: 1 mg/dL (ref 0.2–1.0)
pH, UA: 5 (ref 5.0–7.5)

## 2020-11-30 LAB — MICROSCOPIC EXAMINATION: Bacteria, UA: NONE SEEN

## 2020-11-30 MED ORDER — OXYCODONE-ACETAMINOPHEN 5-325 MG PO TABS: 1.0000 | ORAL_TABLET | Freq: Four times a day (QID) | ORAL | 0 refills | Status: AC | PRN

## 2020-11-30 MED ORDER — TAMSULOSIN HCL 0.4 MG PO CAPS: ORAL_CAPSULE | ORAL | 3 refills | Status: DC

## 2020-11-30 NOTE — Progress Notes (Signed)
11/30/2020 3:04 PM   William Armstrong [birth year] [id number]  CC: Chief Complaint  Patient presents with   Nephrolithiasis    HPI: William Armstrong is a 70 y.o. male with PMH recurrent nephrolithiasis who reports having passed 5 stones over the past 2 weeks at home who presents today for evaluation of persistent right flank pain.  Patient has a history of spontaneous passage of stones, typically without pain, though he has required definitive stone management in the past as well.  Today he reports ongoing right flank pain despite having passed 5 stones measuring 3 to 5 mm in diameter over the past 2 weeks (he provides a photo for review).  He has been taking Tylenol once daily, which is only helping slightly.  He denies nausea or vomiting.  KUB from yesterday with a possible 3 mm stone at the right UVJ.  He also has bilateral renal stones, L>R.  Notably, his left lower pole stone has grown significantly over the past year and now measures approximately 23 x 6 mm.  In-office UA today pan negative; urine microscopy with calcium oxalate crystals.  PMH: Past Medical History:  Diagnosis Date   Anemia    Arthritis    BCC (basal cell carcinoma of skin)    CHF (congestive heart failure) (HCC)    Complication of anesthesia    neck hurt for 2-3 days after general anesthesia   COPD (chronic obstructive pulmonary disease) (HCC)    Coronary artery disease    Depression    Glaucoma    Heart attack ([place])    2013   History of kidney stones    HTN (hypertension)    Hyperglycemia    pt denies   Hypertension    Hypokalemia    Obesity    morbid   Sleep apnea    osa, CPAP has been dc'd after 249 lb weight loss    Surgical History: Past Surgical History:  Procedure Laterality Date   CORONARY STENT INTERVENTION N/A 11/10/2019   Procedure: CORONARY STENT INTERVENTION;  Surgeon: Isaias Cowman, MD;  Location: [facility];  Service: Cardiovascular;  Laterality: N/A;   CORONARY  STENT PLACEMENT  02/2012   CYSTOSCOPY W/ RETROGRADES Left 11/18/2016   Procedure: CYSTOSCOPY WITH RETROGRADE PYELOGRAM;  Surgeon: Irine Seal, MD;  Location: [facility];  Service: Urology;  Laterality: Left;   CYSTOSCOPY WITH STENT PLACEMENT Left 11/18/2016   Procedure: CYSTOSCOPY WITH STENT PLACEMENT;  Surgeon: Irine Seal, MD;  Location: [facility];  Service: Urology;  Laterality: Left;   CYSTOSCOPY/URETEROSCOPY/HOLMIUM LASER/STENT PLACEMENT Left 11/29/2016   Procedure: CYSTOSCOPY/URETEROSCOPY/HOLMIUM LASER/STENT EXCHANGE;  Surgeon: Hollice Espy, MD;  Location: [facility];  Service: Urology;  Laterality: Left;   CYSTOSCOPY/URETEROSCOPY/HOLMIUM LASER/STENT PLACEMENT Right 03/07/2017   Procedure: CYSTOSCOPY/URETEROSCOPY/HOLMIUM LASER/STENT PLACEMENT;  Surgeon: Hollice Espy, MD;  Location: [facility];  Service: Urology;  Laterality: Right;   JOINT REPLACEMENT     right hip   LAPAROSCOPIC GASTRIC RESTRICTIVE DUODENAL PROCEDURE (DUODENAL SWITCH)     LAPAROSCOPIC GASTRIC SLEEVE RESECTION  08/21/2013   LEFT HEART CATH AND CORONARY ANGIOGRAPHY N/A 11/10/2019   Procedure: LEFT HEART CATH AND CORONARY ANGIOGRAPHY;  Surgeon: Teodoro Spray, MD;  Location: [facility];  Service: Cardiovascular;  Laterality: N/A;   skin removal surgery     removal of extra skin approx 20 lbs   STONE EXTRACTION WITH BASKET Right 03/07/2017   Procedure: STONE EXTRACTION WITH BASKET;  Surgeon: Hollice Espy, MD;  Location: [facility];  Service: Urology;  Laterality: Right;   TONSILLECTOMY AND ADENOIDECTOMY  1960   TOTAL HIP ARTHROPLASTY Right 01/18/2017   Procedure: TOTAL HIP ARTHROPLASTY ANTERIOR APPROACH;  Surgeon: Hessie Knows, MD;  Location: [facility];  Service: Orthopedics;  Laterality: Right;    Home Medications:  Allergies as of 11/30/2020       Reactions   Atorvastatin Other (See Comments)   Muscle aches. Other reaction(s): Other (See Comments) Muscle aches. Muscle pain   Pregabalin Other (See Comments)    Muscle soreness Other reaction(s): Other (See Comments) Muscle soreness Severe muscle pain        Medication List        Accurate as of November 30, 2020  3:04 PM. If you have any questions, ask your nurse or doctor.          aspirin EC 81 MG tablet Take 81 mg by mouth daily.   clopidogrel 75 MG tablet Commonly known as: PLAVIX Take 1 tablet (75 mg total) by mouth daily with breakfast.   latanoprost 0.005 % ophthalmic solution Commonly known as: XALATAN Place 1 drop into both eyes at bedtime.   lisinopril 40 MG tablet Commonly known as: ZESTRIL Take 40 mg by mouth daily.   oxyCODONE-acetaminophen 5-325 MG tablet Commonly known as: PERCOCET/ROXICET Take 1-2 tablets by mouth every 6 (six) hours as needed for up to 5 days for severe pain.   Potassium Citrate 15 MEQ (1620 MG) Tbcr Take 1 tablet by mouth 2 (two) times daily.   rosuvastatin 20 MG tablet Commonly known as: CRESTOR Take 1 tablet (20 mg total) by mouth daily.   rosuvastatin 10 MG tablet Commonly known as: CRESTOR Take 10 mg by mouth at bedtime.   sertraline 100 MG tablet Commonly known as: ZOLOFT Take 1 tablet (100 mg total) by mouth daily.   spironolactone 25 MG tablet Commonly known as: ALDACTONE Take 25 mg by mouth daily.   tamsulosin 0.4 MG Caps capsule Commonly known as: FLOMAX Take one capsule daily while passing stones. What changed:  how much to take how to take this when to take this additional instructions   Vitamin D 50 MCG (2000 UT) Caps Take 2,000 Units by mouth daily.        Allergies:  Allergies  Allergen Reactions   Atorvastatin Other (See Comments)    Muscle aches. Other reaction(s): Other (See Comments) Muscle aches. Muscle pain   Pregabalin Other (See Comments)    Muscle soreness Other reaction(s): Other (See Comments) Muscle soreness Severe muscle pain    Family History: Family History  Problem Relation Age of Onset   Heart attack Mother    Brain cancer  Father    Heart attack Sister    Congenital heart disease Sister    Leukemia Paternal Grandmother    COPD Brother    Heart disease Brother    Prostate cancer Neg Hx    Kidney cancer Neg Hx    Bladder Cancer Neg Hx     Social History:   reports that he has never smoked. He has never used smokeless tobacco. He reports that he does not drink alcohol and does not use drugs.  Physical Exam: BP 129/78   Pulse 60   Ht 5\' 7"  (1.702 m)   Wt 267 lb (121.1 kg)   BMI 41.82 kg/m   Constitutional:  Alert and oriented, no acute distress, nontoxic appearing HEENT: [place], AT Cardiovascular: No clubbing, cyanosis, or edema Respiratory: Normal respiratory effort, no increased work of breathing Skin: No rashes, bruises  or suspicious lesions Neurologic: Grossly intact, no focal deficits, moving all 4 extremities Psychiatric: Normal mood and affect  Laboratory Data: Results for orders placed or performed in visit on 11/30/20  Microscopic Examination   Urine  Result Value Ref Range   WBC, UA 0-5 0 - 5 /hpf   RBC 0-2 0 - 2 /hpf   Epithelial Cells (non renal) 0-10 0 - 10 /hpf   Crystals Present (A) N/A   Crystal Type Calcium Oxalate N/A   Bacteria, UA None seen None seen/Few  Urinalysis, Complete  Result Value Ref Range   Specific Gravity, UA 1.025 1.005 - 1.030   pH, UA 5.0 5.0 - 7.5   Color, UA Yellow Yellow   Appearance Ur Hazy (A) Clear   Leukocytes,UA Negative Negative   Protein,UA Negative Negative/Trace   Glucose, UA Negative Negative   Ketones, UA Negative Negative   RBC, UA Negative Negative   Bilirubin, UA Negative Negative   Urobilinogen, Ur 1.0 0.2 - 1.0 mg/dL   Nitrite, UA Negative Negative   Microscopic Examination See below:    Pertinent Imaging: Results for orders placed during the hospital encounter of 11/29/20  Abdomen 1 view (KUB)  Narrative CLINICAL DATA:  Follow-up renal stones.  EXAM: ABDOMEN - 1 VIEW  COMPARISON:  Radiograph July 20, 2020  FINDINGS: The bowel gas pattern is normal. Bilateral renal calculi measuring 7 mm on the right and 2.5 cm on the left. No stones visualized over the expected course of the ureters. Pelvic phleboliths. Right total hip arthroplasty. Lumbar spondylosis.  IMPRESSION: Bilateral renal calculi measuring 7 mm on the right and 2.5 cm on the left.   Electronically Signed By: Dahlia Bailiff MD On: 11/30/2020 09:45  I personally reviewed the images referenced above and note bilateral renal stones, L>R, stable pelvic calcifications, and a possible 3 mm right UVJ stone.  Assessment & Plan:   1. Recurrent nephrolithiasis Persistent flank pain likely due to small 3 mm calcification in the pelvis that appears to be consistent with a right UVJ stone.  Counseled him to start Flomax and push fluids to allow this to pass and prescribing Percocet for pain control.  Given stable vitals, clear UA today, and patient's history of successful and uncomplicated stone passage, okay to follow-up with me as needed.  I counseled him to return to clinic if he remains symptomatic in 2 to 3 weeks and he expressed understanding.  If he remains symptomatic at that time, recommend CT stone study for further evaluation.  Additionally, we discussed that his left lower pole renal stone has grown significantly over the past year such that I do not think he would be able to pass it spontaneously.  We discussed treatment options including surveillance, ESWL, and ureteroscopy.  Patient is scheduled for annual follow-up in 2 months.  He wishes to undergo repeat KUB at that time and decide on definitive management based on the results.  I think this is reasonable.  We discussed return precautions today including fever, chills, nausea, vomiting, and uncontrollable pain. - Urinalysis, Complete - tamsulosin (FLOMAX) 0.4 MG CAPS capsule; Take one capsule daily while passing stones.  Dispense: 30 capsule; Refill: 3 -  oxyCODONE-acetaminophen (PERCOCET/ROXICET) 5-325 MG tablet; Take 1-2 tablets by mouth every 6 (six) hours as needed for up to 5 days for severe pain.  Dispense: 6 tablet; Refill: 0  Return if symptoms worsen or fail to improve.  Debroah Loop, PA-C  [address], [address]  [place], [place] [address] [phone]

## 2020-11-30 NOTE — Patient Instructions (Signed)
Start Flomax daily. I've sent in some pain medication to help too. You may stop Flomax once you pass your stone and your pain resolves.  Please call our office immediately (we are open 8a-5p Monday-Friday) or go to the Emergency Department if you develop any of the following: -Fever -Chills -Nausea and/or vomiting -Pain uncontrollable with Percocet

## 2020-12-01 ENCOUNTER — Telehealth: Payer: Self-pay

## 2020-12-01 NOTE — Telephone Encounter (Signed)
[employer] now says no PA required.

## 2020-12-01 NOTE — Telephone Encounter (Signed)
PA submitted for tamsulosin.

## 2020-12-13 ENCOUNTER — Telehealth: Payer: Self-pay

## 2020-12-13 NOTE — Telephone Encounter (Signed)
  Called pt no answer, mychart message sent.             Vaillancourt, Samantha, PA-C  You 4 minutes ago (4:36 PM)     Let's get a CT stone study to further evaluate his current stone burden. OK to send in Rudolph #8 for pain control in the interim.    Delray Alt, PA-C 3 days ago      It's  about 6 or 7 at times but no burning when I  pee just the back and side  pain. Thanks Jamse Belfast, PA-C  Alean Rinne W 3 days ago      [place], I'm glad you're passing your stones. How is your pain? Better, worse, or the same?     Evelina Bucy, CMA routed conversation to [place], PA-C 3 days ago   Jemario, Poitras, Aldona Bar, PA-C 3 days ago      See attachment of stones passed since July 6,  if I am waiting until August 25 I may some need more pain medicine. Thank you Robie Mcniel DOB May 25, 1951 Phone # [phone] if no answer leave message. I may have already sent this once

## 2020-12-14 ENCOUNTER — Other Ambulatory Visit: Payer: Self-pay | Admitting: Physician Assistant

## 2020-12-14 DIAGNOSIS — N200 Calculus of kidney: Secondary | ICD-10-CM

## 2020-12-14 MED ORDER — OXYCODONE-ACETAMINOPHEN 5-325 MG PO TABS
1.0000 | ORAL_TABLET | Freq: Four times a day (QID) | ORAL | 0 refills | Status: AC | PRN
Start: 2020-12-14 — End: 2020-12-19

## 2020-12-16 ENCOUNTER — Other Ambulatory Visit: Payer: Self-pay | Admitting: Urology

## 2020-12-16 ENCOUNTER — Other Ambulatory Visit: Payer: Self-pay | Admitting: Physician Assistant

## 2020-12-16 DIAGNOSIS — N200 Calculus of kidney: Secondary | ICD-10-CM

## 2021-01-20 ENCOUNTER — Other Ambulatory Visit: Payer: Self-pay | Admitting: Physician Assistant

## 2021-01-20 DIAGNOSIS — N200 Calculus of kidney: Secondary | ICD-10-CM

## 2021-01-24 ENCOUNTER — Other Ambulatory Visit: Payer: Self-pay

## 2021-01-24 ENCOUNTER — Ambulatory Visit
Admission: RE | Admit: 2021-01-24 | Discharge: 2021-01-24 | Disposition: A | Discharge status: Home / Self Care | Payer: Medicare Other | Source: Ambulatory Visit | Attending: Physician Assistant | Admitting: Physician Assistant

## 2021-01-24 DIAGNOSIS — N200 Calculus of kidney: Secondary | ICD-10-CM | POA: Diagnosis present

## 2021-01-26 NOTE — H&P (View-Only) (Signed)
01/27/21 11:21 AM   [name] [birth year] [id number]  Referring provider:  Baxter Hire, MD [place],  [place] [address] Chief Complaint  Patient presents with   Nephrolithiasis     HPI: William Armstrong is a 70 y.o.male who returns today for 6 month follow-up with KUB prior.   He has a PMH of recurrent nephrolithiasis and a history of spontaneous passages of stones typically without pain, though he has required definitive stone management in the past.   CT of abdomen and pelvis without contrast on 01/24/2021 showed bilateral nephrolithiasis with no evidence of ureteral calculi, hydronephrosis, or other acute finding. Multiple renal calculi are again seen bilaterally, largest in the lower pole of the left kidney measuring 10 mm.   KUB showed left kidney stone increasingly larger at 17 mm it is also enlarged.   He is doing well today and is accompanied by his wife. He states he has passed more stones since last visit. He showed picture of stones he passed before his CT that measured up to 30m. He has been able to pass fairly large stones in the past. He was on blood thinners but stopped them last week. He takes baby aspirin everyday and was prescribed this by cardiologist Dr. MLaurance Flatten    PMH: Past Medical History:  Diagnosis Date   Anemia    Arthritis    BCC (basal cell carcinoma of skin)    CHF (congestive heart failure) (HCC)    Complication of anesthesia    neck hurt for 2-3 days after general anesthesia   COPD (chronic obstructive pulmonary disease) (HCC)    Coronary artery disease    Depression    Glaucoma    Heart attack ([place]    2013   History of kidney stones    HTN (hypertension)    Hyperglycemia    pt denies   Hypertension    Hypokalemia    Obesity    morbid   Sleep apnea    osa, CPAP has been dc'd after 249 lb weight loss    Surgical History: Past Surgical History:  Procedure Laterality Date   CORONARY STENT INTERVENTION N/A  11/10/2019   Procedure: CORONARY STENT INTERVENTION;  Surgeon: PIsaias Cowman MD;  [facility];  Service: Cardiovascular;  Laterality: N/A;   CORONARY STENT PLACEMENT  02/2012   CYSTOSCOPY W/ RETROGRADES Left 11/18/2016   Procedure: CYSTOSCOPY WITH RETROGRADE PYELOGRAM;  Surgeon: WIrine Seal MD;  Location: [facility];  Service: Urology;  Laterality: Left;   CYSTOSCOPY WITH STENT PLACEMENT Left 11/18/2016   Procedure: CYSTOSCOPY WITH STENT PLACEMENT;  Surgeon: WIrine Seal MD;  Location: [facility];  Service: Urology;  Laterality: Left;   CYSTOSCOPY/URETEROSCOPY/HOLMIUM LASER/STENT PLACEMENT Left 11/29/2016   Procedure: CYSTOSCOPY/URETEROSCOPY/HOLMIUM LASER/STENT EXCHANGE;  Surgeon: BHollice Espy MD;  Location: [facility];  Service: Urology;  Laterality: Left;   CYSTOSCOPY/URETEROSCOPY/HOLMIUM LASER/STENT PLACEMENT Right 03/07/2017   Procedure: CYSTOSCOPY/URETEROSCOPY/HOLMIUM LASER/STENT PLACEMENT;  Surgeon: BHollice Espy MD;  Location: [facility];  Service: Urology;  Laterality: Right;   JOINT REPLACEMENT     right hip   LAPAROSCOPIC GASTRIC RESTRICTIVE DUODENAL PROCEDURE (DUODENAL SWITCH)     LAPAROSCOPIC GASTRIC SLEEVE RESECTION  08/21/2013   LEFT HEART CATH AND CORONARY ANGIOGRAPHY N/A 11/10/2019   Procedure: LEFT HEART CATH AND CORONARY ANGIOGRAPHY;  Surgeon: FTeodoro Spray MD;  [facility];  Service: Cardiovascular;  Laterality: N/A;   skin removal surgery     removal of extra skin  approx 20 lbs   STONE EXTRACTION WITH BASKET Right 03/07/2017   Procedure: STONE EXTRACTION WITH BASKET;  Surgeon: Hollice Espy, MD;  Location: [facility];  Service: Urology;  Laterality: Right;   TONSILLECTOMY AND ADENOIDECTOMY  1960   TOTAL HIP ARTHROPLASTY Right 01/18/2017   Procedure: TOTAL HIP ARTHROPLASTY ANTERIOR APPROACH;  Surgeon: Hessie Knows, MD;  Location: [facility];  Service: Orthopedics;  Laterality: Right;    Home Medications:  Allergies as of 01/27/2021        Reactions   Atorvastatin Other (See Comments)   Muscle aches. Other reaction(s): Other (See Comments) Muscle aches. Muscle pain   Pregabalin Other (See Comments)   Muscle soreness Other reaction(s): Other (See Comments) Muscle soreness Severe muscle pain        Medication List        Accurate as of January 27, 2021 11:21 AM. If you have any questions, ask your nurse or doctor.          STOP taking these medications    clopidogrel 75 MG tablet Commonly known as: PLAVIX Stopped by: Hollice Espy, MD       TAKE these medications    aspirin EC 81 MG tablet Take 81 mg by mouth daily.   latanoprost 0.005 % ophthalmic solution Commonly known as: XALATAN Place 1 drop into both eyes at bedtime.   lisinopril 40 MG tablet Commonly known as: ZESTRIL Take 40 mg by mouth daily.   Potassium Citrate 15 MEQ (1620 MG) Tbcr TAKE 1 TABLET BY MOUTH TWICE DAILY   rosuvastatin 10 MG tablet Commonly known as: CRESTOR Take 1 tablet by mouth daily. What changed: Another medication with the same name was removed. Continue taking this medication, and follow the directions you see here. Changed by: Hollice Espy, MD   sertraline 100 MG tablet Commonly known as: ZOLOFT Take 1 tablet (100 mg total) by mouth daily.   spironolactone 25 MG tablet Commonly known as: ALDACTONE Take 25 mg by mouth daily.   tamsulosin 0.4 MG Caps capsule Commonly known as: FLOMAX Take one capsule daily while passing stones.   Vitamin D 50 MCG (2000 UT) Caps Take 2,000 Units by mouth daily.        Allergies:  Allergies  Allergen Reactions   Atorvastatin Other (See Comments)    Muscle aches. Other reaction(s): Other (See Comments) Muscle aches. Muscle pain   Pregabalin Other (See Comments)    Muscle soreness Other reaction(s): Other (See Comments) Muscle soreness Severe muscle pain    Family History: Family History  Problem Relation Age of Onset   Heart attack Mother     Brain cancer Father    Heart attack Sister    Congenital heart disease Sister    Leukemia Paternal Grandmother    COPD Brother    Heart disease Brother    Prostate cancer Neg Hx    Kidney cancer Neg Hx    Bladder Cancer Neg Hx     Social History:  reports that he has never smoked. He has never used smokeless tobacco. He reports that he does not drink alcohol and does not use drugs.   Physical Exam: BP 112/68   Pulse 80   Ht 5' 7.5" (1.715 m)   Wt 264 lb (119.7 kg)   BMI 40.74 kg/m   Constitutional:  Alert and oriented, No acute distress. HEENT: [place] AT, moist mucus membranes.  Trachea midline, no masses. Cardiovascular: No clubbing, cyanosis, or edema. Respiratory: Normal respiratory effort, no increased work of  breathing. Skin: No rashes, bruises or suspicious lesions. Neurologic: Grossly intact, no focal deficits, moving all 4 extremities. Psychiatric: Normal mood and affect.  Laboratory Data:  Lab Results  Component Value Date   CREATININE 0.46 (L) 11/11/2019     Lab Results  Component Value Date   HGBA1C 5.1 11/10/2019      Pertinent Imaging: CLINICAL DATA:  Flank pain.  Nephrolithiasis.   EXAM: CT ABDOMEN AND PELVIS WITHOUT CONTRAST   TECHNIQUE: Multidetector CT imaging of the abdomen and pelvis was performed following the standard protocol without IV contrast.   COMPARISON:  11/14/2019   FINDINGS: Lower chest: No acute findings.   Hepatobiliary: A few small fluid attenuation cysts are again seen in the left hepatic lobe. No mass visualized on this unenhanced exam. Gallbladder is unremarkable. No evidence of biliary ductal dilatation.   Pancreas: No mass or inflammatory process visualized on this unenhanced exam.   Spleen:  Within normal limits in size.   Adrenals/Urinary tract: Too small right renal cysts are again seen. Multiple renal calculi are again seen bilaterally, largest in the lower pole of the left kidney measuring 10 mm. No  evidence of ureteral calculi or hydronephrosis.   Stomach/Bowel: Prior sleeve gastrectomy noted. No evidence of obstruction, inflammatory process, or abnormal fluid collections. Normal appendix visualized. Diverticulosis is seen mainly involving the sigmoid colon, however there is no evidence of diverticulitis.   Vascular/Lymphatic: No pathologically enlarged lymph nodes identified. No evidence of abdominal aortic aneurysm. Aortic atherosclerotic calcification noted.   Reproductive:  No mass or other significant abnormality.   Other:  None.   Musculoskeletal: No suspicious bone lesions identified. Right hip prosthesis again seen.   IMPRESSION: Bilateral nephrolithiasis. No evidence of ureteral calculi, hydronephrosis, or other acute findings.   Colonic diverticulosis. No radiographic evidence of diverticulitis.   Aortic Atherosclerosis (ICD10-I70.0).     Electronically Signed   By: Marlaine Hind M.D.   On: 01/24/2021 16:39   I have personally reviewed the images and agree with radiologist interpretation.     Assessment & Plan:   Left lower pole stone  Enlarging, now up to 17 mm  We discussed various treatment options for urolithiasis including observation with or without medical expulsive therapy, shockwave lithotripsy (SWL), ureteroscopy and laser lithotripsy with stent placement, and percutaneous nephrolithotomy.   We discussed that management is based on stone size, location, density, patient co-morbidities, and patient preference.    Stones <60m in size have a >80% spontaneous passage rate. Data surrounding the use of tamsulosin for medical expulsive therapy is controversial, but meta analyses suggests it is most efficacious for distal stones between 5-181min size. Possible side effects include dizziness/lightheadedness, and retrograde ejaculation.   SWL has a lower stone free rate in a single procedure, but also a lower complication rate compared to ureteroscopy  and avoids a stent and associated stent related symptoms. Possible complications include renal hematoma, steinstrasse, and need for additional treatment. We discussed the role of his increased skin to stone distance can lead to decreased efficacy with shockwave lithotripsy.   Ureteroscopy with laser lithotripsy and stent placement has a higher stone free rate than SWL in a single procedure, however increased complication rate including possible infection, ureteral injury, bleeding, and stent related morbidity. Common stent related symptoms include dysuria, urgency/frequency, and flank pain.   After an extensive discussion of the risks and benefits of the above treatment options, the patient would like to proceed with ESWL.   - He will need to stop  aspirin before procedure. Will get permission from cardiologist.   2. Recurrent nephrolithiasis  - Bilateral nephrolithiasis, he has been able to pass stones of fairly significant size with ease   -We discussed recurrent [id number] urine metabolic evaluation after the above is been treated and that he is very active metabolically and stone production to reassess the need for prophylaxis  - Encourage stone diet   Follow-up with ESWL   I,Kailey Littlejohn,acting as a scribe for Hollice Espy, MD.,have documented all relevant documentation on the behalf of Hollice Espy, MD,as directed by  Hollice Espy, MD while in the presence of Hollice Espy, MD.  I have reviewed the above documentation for accuracy and completeness, and I agree with the above.   Hollice Espy, MD   [facility] [address], [place], [place] [address] [phone]

## 2021-01-26 NOTE — Progress Notes (Signed)
01/27/21 11:21 AM   William Armstrong Apr 28, 1951 [id number]  Referring provider:  Baxter Hire, MD [place],  [place] [address] Chief Complaint  Patient presents with   Nephrolithiasis     HPI: William Armstrong is a 70 y.o.male who returns today for 6 month follow-up with KUB prior.   He has a PMH of recurrent nephrolithiasis and a history of spontaneous passages of stones typically without pain, though he has required definitive stone management in the past.   CT of abdomen and pelvis without contrast on 01/24/2021 showed bilateral nephrolithiasis with no evidence of ureteral calculi, hydronephrosis, or other acute finding. Multiple renal calculi are again seen bilaterally, largest in the lower pole of the left kidney measuring 10 mm.   KUB showed left kidney stone increasingly larger at 17 mm it is also enlarged.   He is doing well today and is accompanied by his wife. He states he has passed more stones since last visit. He showed picture of stones he passed before his CT that measured up to 9m. He has been able to pass fairly large stones in the past. He was on blood thinners but stopped them last week. He takes baby aspirin everyday and was prescribed this by cardiologist Dr. MLaurance Flatten    PMH: Past Medical History:  Diagnosis Date   Anemia    Arthritis    BCC (basal cell carcinoma of skin)    CHF (congestive heart failure) (HCC)    Complication of anesthesia    neck hurt for 2-3 days after general anesthesia   COPD (chronic obstructive pulmonary disease) (HCC)    Coronary artery disease    Depression    Glaucoma    [place]   History of kidney stones    HTN (hypertension)    Hyperglycemia    pt denies   Hypertension    Hypokalemia    Obesity    morbid   Sleep apnea    osa, CPAP has been dc'd after 249 lb weight loss    Surgical History: Past Surgical History:  Procedure Laterality Date   CORONARY STENT INTERVENTION N/A  11/10/2019   Procedure: CORONARY STENT INTERVENTION;  Surgeon: PIsaias Cowman MD;  [facility];  Service: Cardiovascular;  Laterality: N/A;   CORONARY STENT PLACEMENT  02/2012   CYSTOSCOPY W/ RETROGRADES Left 11/18/2016   Procedure: CYSTOSCOPY WITH RETROGRADE PYELOGRAM;  Surgeon: WIrine Seal MD;  Location: [facility];  Service: Urology;  Laterality: Left;   CYSTOSCOPY WITH STENT PLACEMENT Left 11/18/2016   Procedure: CYSTOSCOPY WITH STENT PLACEMENT;  Surgeon: WIrine Seal MD;  Location: [facility];  Service: Urology;  Laterality: Left;   CYSTOSCOPY/URETEROSCOPY/HOLMIUM LASER/STENT PLACEMENT Left 11/29/2016   Procedure: CYSTOSCOPY/URETEROSCOPY/HOLMIUM LASER/STENT EXCHANGE;  Surgeon: BHollice Espy MD;  Location: [facility];  Service: Urology;  Laterality: Left;   CYSTOSCOPY/URETEROSCOPY/HOLMIUM LASER/STENT PLACEMENT Right 03/07/2017   Procedure: CYSTOSCOPY/URETEROSCOPY/HOLMIUM LASER/STENT PLACEMENT;  Surgeon: BHollice Espy MD;  Location: [facility];  Service: Urology;  Laterality: Right;   JOINT REPLACEMENT     right hip   LAPAROSCOPIC GASTRIC RESTRICTIVE DUODENAL PROCEDURE (DUODENAL SWITCH)     LAPAROSCOPIC GASTRIC SLEEVE RESECTION  08/21/2013   LEFT HEART CATH AND CORONARY ANGIOGRAPHY N/A 11/10/2019   Procedure: LEFT HEART CATH AND CORONARY ANGIOGRAPHY;  Surgeon: FTeodoro Spray MD;  [facility];  Service: Cardiovascular;  Laterality: N/A;   skin removal surgery     removal of extra skin  approx 20 lbs   STONE EXTRACTION WITH BASKET Right 03/07/2017   Procedure: STONE EXTRACTION WITH BASKET;  Surgeon: Hollice Espy, MD;  Location: [facility];  Service: Urology;  Laterality: Right;   TONSILLECTOMY AND ADENOIDECTOMY  1960   TOTAL HIP ARTHROPLASTY Right 01/18/2017   Procedure: TOTAL HIP ARTHROPLASTY ANTERIOR APPROACH;  Surgeon: Hessie Knows, MD;  Location: [facility];  Service: Orthopedics;  Laterality: Right;    Home Medications:  Allergies as of 01/27/2021        Reactions   Atorvastatin Other (See Comments)   Muscle aches. Other reaction(s): Other (See Comments) Muscle aches. Muscle pain   Pregabalin Other (See Comments)   Muscle soreness Other reaction(s): Other (See Comments) Muscle soreness Severe muscle pain        Medication List        Accurate as of January 27, 2021 11:21 AM. If you have any questions, ask your nurse or doctor.          STOP taking these medications    clopidogrel 75 MG tablet Commonly known as: PLAVIX Stopped by: Hollice Espy, MD       TAKE these medications    aspirin EC 81 MG tablet Take 81 mg by mouth daily.   latanoprost 0.005 % ophthalmic solution Commonly known as: XALATAN Place 1 drop into both eyes at bedtime.   lisinopril 40 MG tablet Commonly known as: ZESTRIL Take 40 mg by mouth daily.   Potassium Citrate 15 MEQ (1620 MG) Tbcr TAKE 1 TABLET BY MOUTH TWICE DAILY   rosuvastatin 10 MG tablet Commonly known as: CRESTOR Take 1 tablet by mouth daily. What changed: Another medication with the same name was removed. Continue taking this medication, and follow the directions you see here. Changed by: Hollice Espy, MD   sertraline 100 MG tablet Commonly known as: ZOLOFT Take 1 tablet (100 mg total) by mouth daily.   spironolactone 25 MG tablet Commonly known as: ALDACTONE Take 25 mg by mouth daily.   tamsulosin 0.4 MG Caps capsule Commonly known as: FLOMAX Take one capsule daily while passing stones.   Vitamin D 50 MCG (2000 UT) Caps Take 2,000 Units by mouth daily.        Allergies:  Allergies  Allergen Reactions   Atorvastatin Other (See Comments)    Muscle aches. Other reaction(s): Other (See Comments) Muscle aches. Muscle pain   Pregabalin Other (See Comments)    Muscle soreness Other reaction(s): Other (See Comments) Muscle soreness Severe muscle pain    Family History: Family History  Problem Relation Age of Onset   Heart attack Mother     Brain cancer Father    Heart attack Sister    Congenital heart disease Sister    Leukemia Paternal Grandmother    COPD Brother    Heart disease Brother    Prostate cancer Neg Hx    Kidney cancer Neg Hx    Bladder Cancer Neg Hx     Social History:  reports that he has never smoked. He has never used smokeless tobacco. He reports that he does not drink alcohol and does not use drugs.   Physical Exam: BP 112/68   Pulse 80   Ht 5' 7.5" (1.715 m)   Wt 264 lb (119.7 kg)   BMI 40.74 kg/m   Constitutional:  Alert and oriented, No acute distress. HEENT: [place] AT, moist mucus membranes.  Trachea midline, no masses. Cardiovascular: No clubbing, cyanosis, or edema. Respiratory: Normal respiratory effort, no increased work of  breathing. Skin: No rashes, bruises or suspicious lesions. Neurologic: Grossly intact, no focal deficits, moving all 4 extremities. Psychiatric: Normal mood and affect.  Laboratory Data:  Lab Results  Component Value Date   CREATININE 0.46 (L) 11/11/2019     Lab Results  Component Value Date   HGBA1C 5.1 11/10/2019      Pertinent Imaging: CLINICAL DATA:  Flank pain.  Nephrolithiasis.   EXAM: CT ABDOMEN AND PELVIS WITHOUT CONTRAST   TECHNIQUE: Multidetector CT imaging of the abdomen and pelvis was performed following the standard protocol without IV contrast.   COMPARISON:  11/14/2019   FINDINGS: Lower chest: No acute findings.   Hepatobiliary: A few small fluid attenuation cysts are again seen in the left hepatic lobe. No mass visualized on this unenhanced exam. Gallbladder is unremarkable. No evidence of biliary ductal dilatation.   Pancreas: No mass or inflammatory process visualized on this unenhanced exam.   Spleen:  Within normal limits in size.   Adrenals/Urinary tract: Too small right renal cysts are again seen. Multiple renal calculi are again seen bilaterally, largest in the lower pole of the left kidney measuring 10 mm. No  evidence of ureteral calculi or hydronephrosis.   Stomach/Bowel: Prior sleeve gastrectomy noted. No evidence of obstruction, inflammatory process, or abnormal fluid collections. Normal appendix visualized. Diverticulosis is seen mainly involving the sigmoid colon, however there is no evidence of diverticulitis.   Vascular/Lymphatic: No pathologically enlarged lymph nodes identified. No evidence of abdominal aortic aneurysm. Aortic atherosclerotic calcification noted.   Reproductive:  No mass or other significant abnormality.   Other:  None.   Musculoskeletal: No suspicious bone lesions identified. Right hip prosthesis again seen.   IMPRESSION: Bilateral nephrolithiasis. No evidence of ureteral calculi, hydronephrosis, or other acute findings.   Colonic diverticulosis. No radiographic evidence of diverticulitis.   Aortic Atherosclerosis (ICD10-I70.0).     Electronically Signed   By: Marlaine Hind M.D.   On: 01/24/2021 16:39   I have personally reviewed the images and agree with radiologist interpretation.     Assessment & Plan:   Left lower pole stone  Enlarging, now up to 17 mm  We discussed various treatment options for urolithiasis including observation with or without medical expulsive therapy, shockwave lithotripsy (SWL), ureteroscopy and laser lithotripsy with stent placement, and percutaneous nephrolithotomy.   We discussed that management is based on stone size, location, density, patient co-morbidities, and patient preference.    Stones <57m in size have a >80% spontaneous passage rate. Data surrounding the use of tamsulosin for medical expulsive therapy is controversial, but meta analyses suggests it is most efficacious for distal stones between 5-147min size. Possible side effects include dizziness/lightheadedness, and retrograde ejaculation.   SWL has a lower stone free rate in a single procedure, but also a lower complication rate compared to ureteroscopy  and avoids a stent and associated stent related symptoms. Possible complications include renal hematoma, steinstrasse, and need for additional treatment. We discussed the role of his increased skin to stone distance can lead to decreased efficacy with shockwave lithotripsy.   Ureteroscopy with laser lithotripsy and stent placement has a higher stone free rate than SWL in a single procedure, however increased complication rate including possible infection, ureteral injury, bleeding, and stent related morbidity. Common stent related symptoms include dysuria, urgency/frequency, and flank pain.   After an extensive discussion of the risks and benefits of the above treatment options, the patient would like to proceed with ESWL.   - He will need to stop  aspirin before procedure. Will get permission from cardiologist.   2. Recurrent nephrolithiasis  - Bilateral nephrolithiasis, he has been able to pass stones of fairly significant size with ease   -We discussed recurrent [id number] urine metabolic evaluation after the above is been treated and that he is very active metabolically and stone production to reassess the need for prophylaxis  - Encourage stone diet   Follow-up with ESWL   I,Kailey Littlejohn,acting as a scribe for Hollice Espy, MD.,have documented all relevant documentation on the behalf of Hollice Espy, MD,as directed by  Hollice Espy, MD while in the presence of Hollice Espy, MD.  I have reviewed the above documentation for accuracy and completeness, and I agree with the above.   Hollice Espy, MD   [facility] [address] [place], [place] [address] [phone]

## 2021-01-27 ENCOUNTER — Other Ambulatory Visit: Payer: Self-pay

## 2021-01-27 ENCOUNTER — Ambulatory Visit: Payer: Medicare Other | Admitting: Urology

## 2021-01-27 VITALS — BP 112/68 | HR 80 | Ht 67.5 in | Wt 264.0 lb

## 2021-01-27 DIAGNOSIS — N200 Calculus of kidney: Secondary | ICD-10-CM | POA: Diagnosis not present

## 2021-01-28 LAB — MICROSCOPIC EXAMINATION: Bacteria, UA: NONE SEEN

## 2021-01-28 LAB — URINALYSIS, COMPLETE
Bilirubin, UA: NEGATIVE
Glucose, UA: NEGATIVE
Ketones, UA: NEGATIVE
Leukocytes,UA: NEGATIVE
Nitrite, UA: NEGATIVE
Protein,UA: NEGATIVE
Specific Gravity, UA: 1.025 (ref 1.005–1.030)
Urobilinogen, Ur: 0.2 mg/dL (ref 0.2–1.0)
pH, UA: 5 (ref 5.0–7.5)

## 2021-02-02 ENCOUNTER — Other Ambulatory Visit: Payer: Self-pay | Admitting: Urology

## 2021-02-02 DIAGNOSIS — N200 Calculus of kidney: Secondary | ICD-10-CM

## 2021-02-02 MED ORDER — DIPHENHYDRAMINE HCL 25 MG PO CAPS: 25.0000 mg | ORAL_CAPSULE | ORAL | Status: AC

## 2021-02-02 MED ORDER — CEPHALEXIN 250 MG PO CAPS: 500.0000 mg | ORAL_CAPSULE | ORAL | Status: AC

## 2021-02-02 MED ORDER — SODIUM CHLORIDE 0.9 % IV SOLN: INTRAVENOUS | Status: DC

## 2021-02-02 MED ORDER — DIAZEPAM 5 MG PO TABS
10.0000 mg | ORAL_TABLET | ORAL | Status: AC
Start: 2021-02-02 — End: 2021-02-03

## 2021-02-02 MED ORDER — ONDANSETRON HCL 4 MG/2ML IJ SOLN: 4.0000 mg | Freq: Once | INTRAMUSCULAR | Status: AC | PRN

## 2021-02-03 ENCOUNTER — Other Ambulatory Visit: Payer: Self-pay

## 2021-02-03 ENCOUNTER — Ambulatory Visit: Payer: Medicare Other

## 2021-02-03 ENCOUNTER — Ambulatory Visit
Admission: RE | Admit: 2021-02-03 | Discharge: 2021-02-03 | Disposition: A | Discharge status: Home / Self Care | Payer: Medicare Other | Attending: Urology | Admitting: Urology

## 2021-02-03 ENCOUNTER — Encounter: Payer: Self-pay | Admitting: Urology

## 2021-02-03 ENCOUNTER — Encounter
Admission: RE | Disposition: A | Discharge status: Home / Self Care | Payer: Self-pay | Source: Home / Self Care | Attending: Urology

## 2021-02-03 DIAGNOSIS — Z7901 Long term (current) use of anticoagulants: Secondary | ICD-10-CM | POA: Insufficient documentation

## 2021-02-03 DIAGNOSIS — I2510 Atherosclerotic heart disease of native coronary artery without angina pectoris: Secondary | ICD-10-CM | POA: Diagnosis not present

## 2021-02-03 DIAGNOSIS — Z8249 Family history of ischemic heart disease and other diseases of the circulatory system: Secondary | ICD-10-CM | POA: Diagnosis not present

## 2021-02-03 DIAGNOSIS — H409 Unspecified glaucoma: Secondary | ICD-10-CM | POA: Diagnosis not present

## 2021-02-03 DIAGNOSIS — Z85828 Personal history of other malignant neoplasm of skin: Secondary | ICD-10-CM | POA: Insufficient documentation

## 2021-02-03 DIAGNOSIS — J449 Chronic obstructive pulmonary disease, unspecified: Secondary | ICD-10-CM | POA: Diagnosis not present

## 2021-02-03 DIAGNOSIS — I509 Heart failure, unspecified: Secondary | ICD-10-CM | POA: Diagnosis not present

## 2021-02-03 DIAGNOSIS — Z806 Family history of leukemia: Secondary | ICD-10-CM | POA: Insufficient documentation

## 2021-02-03 DIAGNOSIS — G4733 Obstructive sleep apnea (adult) (pediatric): Secondary | ICD-10-CM | POA: Insufficient documentation

## 2021-02-03 DIAGNOSIS — Z955 Presence of coronary angioplasty implant and graft: Secondary | ICD-10-CM | POA: Diagnosis not present

## 2021-02-03 DIAGNOSIS — I252 Old myocardial infarction: Secondary | ICD-10-CM | POA: Diagnosis not present

## 2021-02-03 DIAGNOSIS — Z6841 Body Mass Index (BMI) 40.0 and over, adult: Secondary | ICD-10-CM | POA: Diagnosis not present

## 2021-02-03 DIAGNOSIS — I110 Hypertensive heart disease with heart failure: Secondary | ICD-10-CM | POA: Insufficient documentation

## 2021-02-03 DIAGNOSIS — Z7982 Long term (current) use of aspirin: Secondary | ICD-10-CM | POA: Diagnosis not present

## 2021-02-03 DIAGNOSIS — Z79899 Other long term (current) drug therapy: Secondary | ICD-10-CM | POA: Diagnosis not present

## 2021-02-03 DIAGNOSIS — E669 Obesity, unspecified: Secondary | ICD-10-CM | POA: Diagnosis not present

## 2021-02-03 DIAGNOSIS — Z96641 Presence of right artificial hip joint: Secondary | ICD-10-CM | POA: Diagnosis not present

## 2021-02-03 DIAGNOSIS — Z888 Allergy status to other drugs, medicaments and biological substances status: Secondary | ICD-10-CM | POA: Insufficient documentation

## 2021-02-03 DIAGNOSIS — Z9884 Bariatric surgery status: Secondary | ICD-10-CM | POA: Diagnosis not present

## 2021-02-03 DIAGNOSIS — N200 Calculus of kidney: Secondary | ICD-10-CM | POA: Insufficient documentation

## 2021-02-03 HISTORY — PX: EXTRACORPOREAL SHOCK WAVE LITHOTRIPSY: SHX1557

## 2021-02-03 SURGERY — LITHOTRIPSY, ESWL
Anesthesia: Moderate Sedation | Laterality: Left

## 2021-02-03 MED ORDER — CEPHALEXIN 500 MG PO CAPS
ORAL_CAPSULE | ORAL | Status: AC
  Administered 2021-02-03: 500 mg via ORAL
  Filled 2021-02-03: qty 1

## 2021-02-03 MED ORDER — HYDROCODONE-ACETAMINOPHEN 5-325 MG PO TABS: 1.0000 | ORAL_TABLET | Freq: Four times a day (QID) | ORAL | 0 refills | Status: DC | PRN

## 2021-02-03 MED ORDER — CEPHALEXIN 500 MG PO CAPS: 500.0000 mg | ORAL_CAPSULE | Freq: Once | ORAL | Status: AC

## 2021-02-03 MED ORDER — DIAZEPAM 5 MG PO TABS
ORAL_TABLET | ORAL | Status: AC
  Administered 2021-02-03: 10 mg via ORAL
  Filled 2021-02-03: qty 2

## 2021-02-03 MED ORDER — DIPHENHYDRAMINE HCL 25 MG PO CAPS
ORAL_CAPSULE | ORAL | Status: AC
  Administered 2021-02-03: 25 mg via ORAL
  Filled 2021-02-03: qty 1

## 2021-02-03 MED ORDER — ONDANSETRON HCL 4 MG/2ML IJ SOLN
INTRAMUSCULAR | Status: AC
  Administered 2021-02-03: 4 mg via INTRAVENOUS
  Filled 2021-02-03: qty 2

## 2021-02-03 NOTE — Interval H&P Note (Signed)
History and Physical Interval Note:  02/03/2021 8:15 AM  William Armstrong  has presented today for surgery, with the diagnosis of Kidney stone left.  The various methods of treatment have been discussed with the patient and family. After consideration of risks, benefits and other options for treatment, the patient has consented to  Procedure(s): EXTRACORPOREAL SHOCK WAVE LITHOTRIPSY (ESWL) (Left) as a surgical intervention.  The patient's history has been reviewed, patient examined, no change in status, stable for surgery.  I have reviewed the patient's chart and labs.  Questions were answered to the patient's satisfaction.    RRR CTAB   Hollice Espy

## 2021-02-03 NOTE — Discharge Instructions (Addendum)
See [facility] discharge instructions in chart. AMBULATORY SURGERY  DISCHARGE INSTRUCTIONS   The drugs that you were given will stay in your system until tomorrow so for the next 24 hours you should not:  Drive an automobile Make any legal decisions Drink any alcoholic beverage   You may resume regular meals tomorrow.  Today it is better to start with liquids and gradually work up to solid foods.  You may eat anything you prefer, but it is better to start with liquids, then soup and crackers, and gradually work up to solid foods.   Please notify your doctor immediately if you have any unusual bleeding, trouble breathing, redness and pain at the surgery site, drainage, fever, or pain not relieved by medication.    Additional Instructions:        Please contact your physician with any problems or Same Day Surgery at [phone], Monday through Friday 6 am to 4 pm, or [place] [facility] [place] [facility] number at [phone].  

## 2021-02-14 DIAGNOSIS — I350 Nonrheumatic aortic (valve) stenosis: Secondary | ICD-10-CM

## 2021-02-14 HISTORY — DX: Nonrheumatic aortic (valve) stenosis: I35.0

## 2021-02-22 ENCOUNTER — Other Ambulatory Visit: Payer: Self-pay

## 2021-02-22 ENCOUNTER — Ambulatory Visit
Admission: RE | Admit: 2021-02-22 | Discharge: 2021-02-22 | Disposition: A | Discharge status: Home / Self Care | Payer: Medicare Other | Source: Ambulatory Visit | Attending: Physician Assistant | Admitting: Physician Assistant

## 2021-02-22 ENCOUNTER — Ambulatory Visit
Admission: RE | Admit: 2021-02-22 | Discharge: 2021-02-22 | Disposition: A | Discharge status: Home / Self Care | Payer: Medicare Other | Attending: Physician Assistant | Admitting: Physician Assistant

## 2021-02-22 ENCOUNTER — Encounter: Payer: Self-pay | Admitting: Physician Assistant

## 2021-02-22 ENCOUNTER — Other Ambulatory Visit: Payer: Self-pay | Admitting: Physician Assistant

## 2021-02-22 ENCOUNTER — Ambulatory Visit (INDEPENDENT_AMBULATORY_CARE_PROVIDER_SITE_OTHER): Payer: Medicare Other | Admitting: Physician Assistant

## 2021-02-22 VITALS — BP 135/82 | HR 58 | Ht 68.0 in | Wt 262.0 lb

## 2021-02-22 DIAGNOSIS — N200 Calculus of kidney: Secondary | ICD-10-CM

## 2021-02-22 NOTE — Progress Notes (Signed)
02/22/2021 10:56 AM   William Armstrong Aug 28, 1950 [id number]  CC: Chief Complaint  Patient presents with   Nephrolithiasis   HPI: William Armstrong is a 70 y.o. male with PMH recurrent nephrolithiasis who underwent ESWL with Dr. Erlene Quan 19 days ago for management of a 17 mm left renal stone who presents today for postop follow-up.  Operative note not available for review.  Today he reports feeling well without flank pain or gross hematuria.  He has passed numerous fragments, which he brings with him to clinic today.  He has no acute concerns today.  KUB today reveals significant decrease in size of his left renal stone, now measuring approximately 8 mm in length.  In-office UA and microscopy today pan negative.  PMH: Past Medical History:  Diagnosis Date   Anemia    Arthritis    BCC (basal cell carcinoma of skin)    CHF (congestive heart failure) (HCC)    Complication of anesthesia    neck hurt for 2-3 days after general anesthesia   COPD (chronic obstructive pulmonary disease) (HCC)    Coronary artery disease    Depression    Glaucoma    Heart attack ([place])    2013   History of kidney stones    HTN (hypertension)    Hyperglycemia    pt denies   Hypertension    Hypokalemia    Obesity    morbid   Sleep apnea    osa, CPAP has been dc'd after 249 lb weight loss    Surgical History: Past Surgical History:  Procedure Laterality Date   CORONARY STENT INTERVENTION N/A 11/10/2019   Procedure: CORONARY STENT INTERVENTION;  Surgeon: Isaias Cowman, MD;  Location: [facility];  Service: Cardiovascular;  Laterality: N/A;   CORONARY STENT PLACEMENT  02/2012   CYSTOSCOPY W/ RETROGRADES Left 11/18/2016   Procedure: CYSTOSCOPY WITH RETROGRADE PYELOGRAM;  Surgeon: Irine Seal, MD;  Location: [facility];  Service: Urology;  Laterality: Left;   CYSTOSCOPY WITH STENT PLACEMENT Left 11/18/2016   Procedure: CYSTOSCOPY WITH STENT PLACEMENT;  Surgeon: Irine Seal, MD;  Location:  [facility];  Service: Urology;  Laterality: Left;   CYSTOSCOPY/URETEROSCOPY/HOLMIUM LASER/STENT PLACEMENT Left 11/29/2016   Procedure: CYSTOSCOPY/URETEROSCOPY/HOLMIUM LASER/STENT EXCHANGE;  Surgeon: Hollice Espy, MD;  Location: [facility];  Service: Urology;  Laterality: Left;   CYSTOSCOPY/URETEROSCOPY/HOLMIUM LASER/STENT PLACEMENT Right 03/07/2017   Procedure: CYSTOSCOPY/URETEROSCOPY/HOLMIUM LASER/STENT PLACEMENT;  Surgeon: Hollice Espy, MD;  Location: [facility];  Service: Urology;  Laterality: Right;   EXTRACORPOREAL SHOCK WAVE LITHOTRIPSY Left 02/03/2021   Procedure: EXTRACORPOREAL SHOCK WAVE LITHOTRIPSY (ESWL);  Surgeon: Hollice Espy, MD;  Location: [facility];  Service: Urology;  Laterality: Left;   JOINT REPLACEMENT     right hip   LAPAROSCOPIC GASTRIC RESTRICTIVE DUODENAL PROCEDURE (DUODENAL SWITCH)     LAPAROSCOPIC GASTRIC SLEEVE RESECTION  08/21/2013   LEFT HEART CATH AND CORONARY ANGIOGRAPHY N/A 11/10/2019   Procedure: LEFT HEART CATH AND CORONARY ANGIOGRAPHY;  Surgeon: Teodoro Spray, MD;  Location: [place] [facility];  Service: Cardiovascular;  Laterality: N/A;   skin removal surgery     removal of extra skin approx 20 lbs   STONE EXTRACTION WITH BASKET Right 03/07/2017   Procedure: STONE EXTRACTION WITH BASKET;  Surgeon: Hollice Espy, MD;  Location: [facility];  Service: Urology;  Laterality: Right;   TONSILLECTOMY AND ADENOIDECTOMY  1960   TOTAL HIP ARTHROPLASTY Right 01/18/2017   Procedure: TOTAL HIP ARTHROPLASTY ANTERIOR APPROACH;  Surgeon: Hessie Knows, MD;  Location: [facility];  Service: Orthopedics;  Laterality: Right;    Home Medications:  Allergies as of 02/22/2021       Reactions   Atorvastatin Other (See Comments)   Muscle aches. Other reaction(s): Other (See Comments) Muscle aches. Muscle pain   Pregabalin Other (See Comments)   Muscle soreness Other reaction(s): Other (See Comments) Muscle soreness Severe muscle pain        Medication List         Accurate as of February 22, 2021 10:56 AM. If you have any questions, ask your nurse or doctor.          aspirin EC 81 MG tablet Take 81 mg by mouth daily.   HYDROcodone-acetaminophen 5-325 MG tablet Commonly known as: NORCO/VICODIN Take 1-2 tablets by mouth every 6 (six) hours as needed for moderate pain.   latanoprost 0.005 % ophthalmic solution Commonly known as: XALATAN Place 1 drop into both eyes at bedtime.   lisinopril 40 MG tablet Commonly known as: ZESTRIL Take 40 mg by mouth daily.   Potassium Citrate 15 MEQ (1620 MG) Tbcr TAKE 1 TABLET BY MOUTH TWICE DAILY   rosuvastatin 10 MG tablet Commonly known as: CRESTOR Take 1 tablet by mouth daily.   sertraline 100 MG tablet Commonly known as: ZOLOFT Take 1 tablet (100 mg total) by mouth daily.   spironolactone 25 MG tablet Commonly known as: ALDACTONE Take 25 mg by mouth daily.   tamsulosin 0.4 MG Caps capsule Commonly known as: FLOMAX Take one capsule daily while passing stones.   Vitamin D 50 MCG (2000 UT) Caps Take 2,000 Units by mouth daily.        Allergies:  Allergies  Allergen Reactions   Atorvastatin Other (See Comments)    Muscle aches. Other reaction(s): Other (See Comments) Muscle aches. Muscle pain   Pregabalin Other (See Comments)    Muscle soreness Other reaction(s): Other (See Comments) Muscle soreness Severe muscle pain    Family History: Family History  Problem Relation Age of Onset   Heart attack Mother    Brain cancer Father    Heart attack Sister    Congenital heart disease Sister    Leukemia Paternal Grandmother    COPD Brother    Heart disease Brother    Prostate cancer Neg Hx    Kidney cancer Neg Hx    Bladder Cancer Neg Hx     Social History:   reports that he has never smoked. He has never used smokeless tobacco. He reports that he does not drink alcohol and does not use drugs.  Physical Exam: BP 135/82   Pulse (!) 58   Ht 5\' 8"  (1.727 m)   Wt 262 lb  (118.8 kg)   BMI 39.84 kg/m   Constitutional:  Alert and oriented, no acute distress, nontoxic appearing HEENT: [place], AT Cardiovascular: No clubbing, cyanosis, or edema Respiratory: Normal respiratory effort, no increased work of breathing Skin: No rashes, bruises or suspicious lesions Neurologic: Grossly intact, no focal deficits, moving all 4 extremities Psychiatric: Normal mood and affect  Laboratory Data: Results for orders placed or performed in visit on 02/22/21  Microscopic Examination   Urine  Result Value Ref Range   WBC, UA 0-5 0 - 5 /hpf   RBC None seen 0 - 2 /hpf   Epithelial Cells (non renal) 0-10 0 - 10 /hpf   Bacteria, UA None seen None seen/Few  Urinalysis, Complete  Result Value Ref Range   Specific Gravity, UA 1.020 1.005 - 1.030  pH, UA 5.5 5.0 - 7.5   Color, UA Yellow Yellow   Appearance Ur Clear Clear   Leukocytes,UA Negative Negative   Protein,UA Negative Negative/Trace   Glucose, UA Negative Negative   Ketones, UA Negative Negative   RBC, UA Negative Negative   Bilirubin, UA Negative Negative   Urobilinogen, Ur 0.2 0.2 - 1.0 mg/dL   Nitrite, UA Negative Negative   Microscopic Examination See below:    Pertinent Imaging: KUB, 02/22/2021: CLINICAL DATA:  Follow-up left kidney stone, patient reports lithotripsy 02/03/2021.   EXAM: ABDOMEN - 1 VIEW   COMPARISON:  Radiograph 02/03/2021.  CT 01/24/2021   FINDINGS: 8 mm stone projects over the lower left renal shadow, previously 2.4 cm. There may be additional small stone or stone fragments in the left kidney versus overlying enteric contents. 6 mm stone in the lower right kidney. Additional small right renal calculi on CT are not well seen by radiograph. No stones over the course of the ureters. Multiple pelvic phleboliths. Normal bowel gas pattern with small volume of stool. Enteric sutures in the central abdomen. Right hip arthroplasty.   IMPRESSION: Bilateral intrarenal calculi, largest stone  on the left has diminished in size from prior post lithotripsy.     Electronically Signed   By: Keith Rake M.D.   On: 02/22/2021 23:12  I personally reviewed the images referenced above and note interval decrease in size of his left renal stone, now measuring approximately 8 mm in length.  Assessment & Plan:   1. Kidney stone on left side Significant improvement in left-sided stone burden following ESWL.  He remains asymptomatic of the stone.  I offered the patient consideration of repeat ESWL to treat his remaining stone, however I did not recommend proceeding with this for several weeks in case he continues to pass any fragments.  Patient declined this, which is reasonable.  We will plan for repeat Litholink when he is at least 1 month out from recent ESWL.  Patient's wife has been hospitalized and so he anticipates a delay to completing this, which is also reasonable.  We will follow-up with him regarding his results when they become available. - Urinalysis, Complete - Calculi, with Photograph (to Clinical Lab)  Return if symptoms worsen or fail to improve.  Debroah Loop, PA-C  [facility] [address] [place],  [address] [phone]

## 2021-02-23 LAB — MICROSCOPIC EXAMINATION
Bacteria, UA: NONE SEEN
RBC, Urine: NONE SEEN /hpf (ref 0–2)

## 2021-02-23 LAB — URINALYSIS, COMPLETE
Bilirubin, UA: NEGATIVE
Glucose, UA: NEGATIVE
Ketones, UA: NEGATIVE
Leukocytes,UA: NEGATIVE
Nitrite, UA: NEGATIVE
Protein,UA: NEGATIVE
RBC, UA: NEGATIVE
Specific Gravity, UA: 1.02 (ref 1.005–1.030)
Urobilinogen, Ur: 0.2 mg/dL (ref 0.2–1.0)
pH, UA: 5.5 (ref 5.0–7.5)

## 2021-02-26 LAB — CALCULI, WITH PHOTOGRAPH (CLINICAL LAB)
Calcium Oxalate Monohydrate: 100 %
Weight Calculi: 945 mg

## 2021-05-03 ENCOUNTER — Other Ambulatory Visit: Payer: Self-pay

## 2021-05-03 ENCOUNTER — Other Ambulatory Visit: Payer: Medicare Other

## 2021-05-11 ENCOUNTER — Other Ambulatory Visit: Payer: Self-pay | Admitting: Physician Assistant

## 2021-05-13 ENCOUNTER — Other Ambulatory Visit: Payer: Self-pay

## 2021-05-13 ENCOUNTER — Ambulatory Visit: Payer: Medicare Other | Admitting: Physician Assistant

## 2021-05-13 ENCOUNTER — Encounter: Payer: Self-pay | Admitting: Physician Assistant

## 2021-05-13 DIAGNOSIS — N200 Calculus of kidney: Secondary | ICD-10-CM | POA: Diagnosis not present

## 2021-05-13 MED ORDER — POTASSIUM CITRATE ER 15 MEQ (1620 MG) PO TBCR: 1.0000 | EXTENDED_RELEASE_TABLET | Freq: Three times a day (TID) | ORAL | 11 refills | Status: DC

## 2021-05-13 NOTE — Patient Instructions (Signed)
Increase your daily fluid intake to 80-100oz. Increase your potassium citrate to 3x68mEq tablets daily. You can take 2 in the one dose and 1 in another or spread them out in 3 separate doses. Let's repeat your LithoLink in about 3 months on this new regimen.

## 2021-05-13 NOTE — Progress Notes (Signed)
05/13/2021 2:37 PM   William Armstrong 18-Jun-1950 [id number]  CC: Chief Complaint  Patient presents with   Nephrolithiasis   HPI: William Armstrong is a 70 y.o. 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   Today he reports he is only taking his prescribed potassiums citrate 15 mEq once daily because he tends to forget the second dose.  He drinks approximately half a gallon of water daily.  He passed 2 small kidney stones in the last 2 weeks without difficulty and denies any residual flank pain.  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  He is taking spironolactone daily but does not have a history of hyperkalemia.  PMH: Past Medical History:  Diagnosis Date   Anemia    Arthritis    BCC (basal cell carcinoma of skin)    CHF (congestive heart failure) (HCC)    Complication of anesthesia    neck hurt for 2-3 days after general anesthesia   COPD (chronic obstructive pulmonary disease) (HCC)    Coronary artery disease    Depression    Glaucoma    Heart attack ([place])    2013   History of kidney stones    HTN (hypertension)    Hyperglycemia    pt denies   Hypertension    Hypokalemia    Obesity    morbid   Sleep apnea    osa, CPAP has been dc'd after 249 lb weight loss    Surgical History: Past Surgical History:  Procedure Laterality Date   CORONARY STENT INTERVENTION N/A 11/10/2019   Procedure: CORONARY STENT INTERVENTION;  Surgeon: Isaias Cowman, MD;  Location: [place] [facility];  Service: Cardiovascular;  Laterality: N/A;   CORONARY STENT PLACEMENT  02/2012   CYSTOSCOPY W/ RETROGRADES Left 11/18/2016   Procedure: CYSTOSCOPY WITH RETROGRADE PYELOGRAM;  Surgeon: Irine Seal, MD;  Location: [facility];  Service: Urology;   Laterality: Left;   CYSTOSCOPY WITH STENT PLACEMENT Left 11/18/2016   Procedure: CYSTOSCOPY WITH STENT PLACEMENT;  Surgeon: Irine Seal, MD;  Location: [facility];  Service: Urology;  Laterality: Left;   CYSTOSCOPY/URETEROSCOPY/HOLMIUM LASER/STENT PLACEMENT Left 11/29/2016   Procedure: CYSTOSCOPY/URETEROSCOPY/HOLMIUM LASER/STENT EXCHANGE;  Surgeon: Hollice Espy, MD;  Location: [facility];  Service: Urology;  Laterality: Left;   CYSTOSCOPY/URETEROSCOPY/HOLMIUM LASER/STENT PLACEMENT Right 03/07/2017   Procedure: CYSTOSCOPY/URETEROSCOPY/HOLMIUM LASER/STENT PLACEMENT;  Surgeon: Hollice Espy, MD;  Location: [facility];  Service: Urology;  Laterality: Right;   EXTRACORPOREAL SHOCK WAVE LITHOTRIPSY Left 02/03/2021   Procedure: EXTRACORPOREAL SHOCK WAVE LITHOTRIPSY (ESWL);  Surgeon: Hollice Espy, MD;  Location: [facility];  Service: Urology;  Laterality: Left;   JOINT REPLACEMENT     right hip   LAPAROSCOPIC GASTRIC RESTRICTIVE DUODENAL PROCEDURE (DUODENAL SWITCH)     LAPAROSCOPIC GASTRIC SLEEVE RESECTION  08/21/2013   LEFT HEART CATH AND CORONARY ANGIOGRAPHY N/A 11/10/2019   Procedure: LEFT HEART CATH AND CORONARY ANGIOGRAPHY;  Surgeon: Teodoro Spray, MD;  Location: [place] [facility];  Service: Cardiovascular;  Laterality: N/A;   skin removal surgery     removal of extra skin approx 20 lbs   STONE EXTRACTION WITH BASKET Right 03/07/2017   Procedure: STONE EXTRACTION WITH BASKET;  Surgeon: Hollice Espy, MD;  Location: [facility];  Service: Urology;  Laterality: Right;   [place]  TOTAL HIP ARTHROPLASTY Right 01/18/2017   Procedure: TOTAL HIP ARTHROPLASTY ANTERIOR APPROACH;  Surgeon: Hessie Knows, MD;  Location: [facility];  Service: Orthopedics;  Laterality: Right;    Home Medications:  Allergies as of 05/13/2021       Reactions   Atorvastatin Other (See Comments)   Muscle aches. Other reaction(s): Other (See Comments) Muscle aches. Muscle pain   Pregabalin Other  (See Comments)   Muscle soreness Other reaction(s): Other (See Comments) Muscle soreness Severe muscle pain        Medication List        Accurate as of May 13, 2021  2:37 PM. If you have any questions, ask your nurse or doctor.          aspirin EC 81 MG tablet Take 81 mg by mouth daily.   HYDROcodone-acetaminophen 5-325 MG tablet Commonly known as: NORCO/VICODIN Take 1-2 tablets by mouth every 6 (six) hours as needed for moderate pain.   latanoprost 0.005 % ophthalmic solution Commonly known as: XALATAN Place 1 drop into both eyes at bedtime.   lisinopril 40 MG tablet Commonly known as: ZESTRIL Take 40 mg by mouth daily.   Potassium Citrate 15 MEQ (1620 MG) Tbcr Take 1 tablet by mouth in the morning, at noon, and at bedtime. What changed: when to take this Changed by: Debroah Loop, PA-C   rosuvastatin 10 MG tablet Commonly known as: CRESTOR Take 1 tablet by mouth daily.   sertraline 100 MG tablet Commonly known as: ZOLOFT Take 1 tablet (100 mg total) by mouth daily.   spironolactone 25 MG tablet Commonly known as: ALDACTONE Take 25 mg by mouth daily.   tamsulosin 0.4 MG Caps capsule Commonly known as: FLOMAX Take one capsule daily while passing stones.   Vitamin D 50 MCG (2000 UT) Caps Take 2,000 Units by mouth daily.        Allergies:  Allergies  Allergen Reactions   Atorvastatin Other (See Comments)    Muscle aches. Other reaction(s): Other (See Comments) Muscle aches. Muscle pain   Pregabalin Other (See Comments)    Muscle soreness Other reaction(s): Other (See Comments) Muscle soreness Severe muscle pain    Family History: Family History  Problem Relation Age of Onset   Heart attack Mother    Brain cancer Father    Heart attack Sister    Congenital heart disease Sister    Leukemia Paternal Grandmother    COPD Brother    Heart disease Brother    Prostate cancer Neg Hx    Kidney cancer Neg Hx    Bladder Cancer  Neg Hx     Social History:   reports that he has never smoked. He has never used smokeless tobacco. He reports that he does not drink alcohol and does not use drugs.  Physical Exam: BP 111/69   Pulse 66   Ht 5\' 7"  (1.702 m)   Wt 265 lb (120.2 kg)   BMI 41.50 kg/m   Constitutional:  Alert and oriented, no acute distress, nontoxic appearing HEENT: [place], AT Cardiovascular: No clubbing, cyanosis, or edema Respiratory: Normal respiratory effort, no increased work of breathing Skin: No rashes, bruises or suspicious lesions Neurologic: Grossly intact, no focal deficits, moving all 4 extremities Psychiatric: Normal mood and affect  Assessment & Plan:   1. Recurrent nephrolithiasis We discussed tackling his urine volume and hypocitraturia first.  I would like him to increase his daily fluid intake to 80 to 100 ounces and increase potassium citrate to 45 mEq  daily in 2-3 divided doses.  With his degree of hypocitraturia, we could consider increasing this to 60 mEq daily in the future, however I am hesitant to do this given that he is already on a potassium sparing diuretic.  We will plan for a BMP in clinic in 1 month to make sure his potassium level remains within normal limits and repeat a Litholink in 3 months.  Patient is in agreement with this plan. - Potassium Citrate 15 MEQ (1620 MG) TBCR; Take 1 tablet by mouth in the morning, at noon, and at bedtime.  Dispense: 90 tablet; Refill: 11 - Basic metabolic panel; Future  Return in about 4 weeks (around 06/10/2021) for Lab visit for BMP + 3 month repeat LithoLink with Larene [place].  Debroah Loop, PA-C  [facility] [address], [place], [place] [address] [phone]

## 2021-05-17 ENCOUNTER — Other Ambulatory Visit: Payer: Self-pay | Admitting: Physician Assistant

## 2021-05-23 ENCOUNTER — Encounter: Payer: Self-pay | Admitting: Urology

## 2021-06-13 ENCOUNTER — Other Ambulatory Visit: Payer: Medicare Other

## 2021-06-13 ENCOUNTER — Other Ambulatory Visit: Payer: Self-pay

## 2021-06-13 DIAGNOSIS — N200 Calculus of kidney: Secondary | ICD-10-CM

## 2021-06-14 ENCOUNTER — Other Ambulatory Visit: Payer: Self-pay

## 2021-06-14 ENCOUNTER — Telehealth: Payer: Self-pay

## 2021-06-14 DIAGNOSIS — N200 Calculus of kidney: Secondary | ICD-10-CM

## 2021-06-14 LAB — BASIC METABOLIC PANEL
BUN/Creatinine Ratio: 26 — ABNORMAL HIGH (ref 10–24)
BUN: 18 mg/dL (ref 8–27)
CO2: 21 mmol/L (ref 20–29)
Calcium: 9.4 mg/dL (ref 8.6–10.2)
Chloride: 103 mmol/L (ref 96–106)
Creatinine, Ser: 0.68 mg/dL — ABNORMAL LOW (ref 0.76–1.27)
Glucose: 94 mg/dL (ref 70–99)
Potassium: 5.5 mmol/L — ABNORMAL HIGH (ref 3.5–5.2)
Sodium: 140 mmol/L (ref 134–144)
eGFR: 99 mL/min/{1.73_m2} (ref >59)

## 2021-06-14 NOTE — Telephone Encounter (Signed)
1 week repeat BMP appt made. Patient expressed understanding.

## 2021-06-14 NOTE — Telephone Encounter (Signed)
-----   Message from Nori Riis, PA-C sent at 06/14/2021  7:23 AM EST ----- Please let William Armstrong know that he needs to stop the potassium citrate that he is taking for stone prevention as his potassium level is now elevated at 5.5.  I would also like him to have his basic metabolic panel repeated in 1 week.

## 2021-06-14 NOTE — Telephone Encounter (Signed)
Ok per DPR, [facility] notifying pt of results. Advised pt BMP will need to be repeated in one week, asked to return call to get lab appt scheduled.

## 2021-06-21 ENCOUNTER — Other Ambulatory Visit: Payer: Medicare Other

## 2021-06-21 ENCOUNTER — Other Ambulatory Visit: Payer: Self-pay

## 2021-06-21 DIAGNOSIS — N200 Calculus of kidney: Secondary | ICD-10-CM

## 2021-06-22 LAB — BASIC METABOLIC PANEL
BUN/Creatinine Ratio: 24 (ref 10–24)
BUN: 18 mg/dL (ref 8–27)
CO2: 24 mmol/L (ref 20–29)
Calcium: 8.9 mg/dL (ref 8.6–10.2)
Chloride: 103 mmol/L (ref 96–106)
Creatinine, Ser: 0.74 mg/dL — ABNORMAL LOW (ref 0.76–1.27)
Glucose: 91 mg/dL (ref 70–99)
Potassium: 5.1 mmol/L (ref 3.5–5.2)
Sodium: 138 mmol/L (ref 134–144)
eGFR: 96 mL/min/{1.73_m2} (ref >59)

## 2021-06-23 ENCOUNTER — Telehealth: Payer: Self-pay | Admitting: *Deleted

## 2021-06-23 NOTE — Telephone Encounter (Signed)
-----   Message from Nori Riis, PA-C sent at 06/22/2021  8:41 AM EST ----- Please let William Armstrong know that his potassium has now returned to normal levels.  Going forward, he can either choose to increase his citrate by drinking citrus juices, speak with his cardiologist to see if he can be switched from spironolactone to a nonpotassium sparing diuretic and then restart the potassium citrate in hopes will not raise the potassium or refer to [facility] stone clinic for further recommendations.

## 2021-06-23 NOTE — Telephone Encounter (Signed)
.  left message to have patient return my call.  

## 2021-06-23 NOTE — Telephone Encounter (Signed)
Pt sent my chart asking about labs, see mychart message

## 2021-07-01 ENCOUNTER — Other Ambulatory Visit: Payer: Self-pay | Admitting: Physician Assistant

## 2021-07-01 DIAGNOSIS — N200 Calculus of kidney: Secondary | ICD-10-CM

## 2021-08-14 LAB — COLOGUARD

## 2021-08-16 ENCOUNTER — Other Ambulatory Visit: Payer: Self-pay

## 2021-08-16 ENCOUNTER — Other Ambulatory Visit: Payer: Medicare Other

## 2021-08-23 ENCOUNTER — Other Ambulatory Visit: Payer: Self-pay | Admitting: Physician Assistant

## 2021-08-28 LAB — COLOGUARD: COLOGUARD: NEGATIVE

## 2021-08-28 LAB — EXTERNAL GENERIC LAB PROCEDURE: COLOGUARD: NEGATIVE

## 2021-09-01 ENCOUNTER — Ambulatory Visit: Payer: Medicare Other | Admitting: Physician Assistant

## 2021-09-01 ENCOUNTER — Encounter: Payer: Self-pay | Admitting: Physician Assistant

## 2021-09-01 VITALS — BP 116/70 | HR 73 | Ht 67.0 in | Wt 265.0 lb

## 2021-09-01 DIAGNOSIS — N200 Calculus of kidney: Secondary | ICD-10-CM | POA: Diagnosis not present

## 2021-09-01 NOTE — Progress Notes (Signed)
? ?09/01/2021 ?9:55 AM  ? ?William Armstrong ?30-May-1951 ?[id number] ? ?CC: ?Chief Complaint  ?Patient presents with  ? Nephrolithiasis  ? ?HPI: ?William Armstrong is a 71 y.o. male with PMH recurrent nephrolithiasis and bariatric surgeries including gastric sleeve and duodenal switch who presents today for repeat Litholink results.  ? ?I saw him in clinic most recently on 05/13/2021, at which point [place] revealed inadequate urine volume, hypocitraturia, and hyperoxaluria.  I increased his potassium citrate to 45 mEq daily at that time, though he was on spironolactone with no history of hyperkalemia. ? ?In the interim, he underwent repeat BMP which revealed hyperkalemia and he was instructed to stop potassium citrate. ? ?Today he reports he remains off potassium citrate and has continued spironolactone.  He continues to stay well-hydrated and continues to pass stones spontaneously, most recently 2 weeks ago.  He reports minimal bother from his recurrent stone episodes and he denies pain today. ? ?Repeat Litholink collected on 08/16/2021 reveals adequate urine volume (1.13->2.30 L/day), improved hyperoxaluria (106->73 mg/day), and improved hypocitraturia (105->179 mg/day).  Repeat CMP on 07/27/2021 revealed normal potassium at 4.1 mmol/L. ? ?PMH: ?Past Medical History:  ?Diagnosis Date  ? Anemia   ? Arthritis   ? BCC (basal cell carcinoma of skin)   ? CHF (congestive heart failure) ([place])   ? Complication of anesthesia   ? neck hurt for 2-3 days after general anesthesia  ? COPD (chronic obstructive pulmonary disease) ([place])   ? Coronary artery disease   ? Depression   ? Glaucoma   ? Heart attack ([place])   ? 2013  ? History of kidney stones   ? HTN (hypertension)   ? Hyperglycemia   ? pt denies  ? Hypertension   ? Hypokalemia   ? Obesity   ? morbid  ? Sleep apnea   ? osa, CPAP has been dc'd after 249 lb weight loss  ? ? ?Surgical History: ?Past Surgical History:  ?Procedure Laterality Date  ? CORONARY STENT INTERVENTION N/A  11/10/2019  ? Procedure: CORONARY STENT INTERVENTION;  Surgeon: Isaias Cowman, MD;  Location: [place] [facility];  Service: Cardiovascular;  Laterality: N/A;  ? CORONARY STENT PLACEMENT  02/2012  ? CYSTOSCOPY W/ RETROGRADES Left 11/18/2016  ? Procedure: CYSTOSCOPY WITH RETROGRADE PYELOGRAM;  Surgeon: Irine Seal, MD;  Location: [facility];  Service: Urology;  Laterality: Left;  ? CYSTOSCOPY WITH STENT PLACEMENT Left 11/18/2016  ? Procedure: CYSTOSCOPY WITH STENT PLACEMENT;  Surgeon: Irine Seal, MD;  Location: [facility];  Service: Urology;  Laterality: Left;  ? CYSTOSCOPY/URETEROSCOPY/HOLMIUM LASER/STENT PLACEMENT Left 11/29/2016  ? Procedure: CYSTOSCOPY/URETEROSCOPY/HOLMIUM LASER/STENT EXCHANGE;  Surgeon: Hollice Espy, MD;  Location: [facility];  Service: Urology;  Laterality: Left;  ? CYSTOSCOPY/URETEROSCOPY/HOLMIUM LASER/STENT PLACEMENT Right 03/07/2017  ? Procedure: CYSTOSCOPY/URETEROSCOPY/HOLMIUM LASER/STENT PLACEMENT;  Surgeon: Hollice Espy, MD;  Location: [facility];  Service: Urology;  Laterality: Right;  ? EXTRACORPOREAL SHOCK WAVE LITHOTRIPSY Left 02/03/2021  ? Procedure: EXTRACORPOREAL SHOCK WAVE LITHOTRIPSY (ESWL);  Surgeon: Hollice Espy, MD;  Location: [facility];  Service: Urology;  Laterality: Left;  ? JOINT REPLACEMENT    ? right hip  ? LAPAROSCOPIC GASTRIC RESTRICTIVE DUODENAL PROCEDURE (DUODENAL SWITCH)    ? LAPAROSCOPIC GASTRIC SLEEVE RESECTION  08/21/2013  ? LEFT HEART CATH AND CORONARY ANGIOGRAPHY N/A 11/10/2019  ? Procedure: LEFT HEART CATH AND CORONARY ANGIOGRAPHY;  Surgeon: Teodoro Spray, MD;  Location: [place] [facility];  Service: Cardiovascular;  Laterality: N/A;  ? skin removal surgery    ? removal of  extra skin approx 20 lbs  ? STONE EXTRACTION WITH BASKET Right 03/07/2017  ? Procedure: STONE EXTRACTION WITH BASKET;  Surgeon: Hollice Espy, MD;  Location: [facility];  Service: Urology;  Laterality: Right;  ? [place]  ? TOTAL HIP ARTHROPLASTY Right 01/18/2017   ? Procedure: TOTAL HIP ARTHROPLASTY ANTERIOR APPROACH;  Surgeon: Hessie Knows, MD;  Location: [facility];  Service: Orthopedics;  Laterality: Right;  ? ? ?Home Medications:  ?Allergies as of 09/01/2021   ? ?   Reactions  ? Atorvastatin Other (See Comments)  ? Muscle aches. ?Other reaction(s): Other (See Comments) ?Muscle aches. ?Muscle pain  ? Pregabalin Other (See Comments)  ? Muscle soreness ?Other reaction(s): Other (See Comments) ?Muscle soreness ?Severe muscle pain  ? ?  ? ?  ?Medication List  ?  ? ?  ? Accurate as of September 01, 2021  9:55 AM. If you have any questions, ask your nurse or doctor.  ?  ?  ? ?  ? ?STOP taking these medications   ? ?Potassium Citrate 15 MEQ (1620 MG) Tbcr ?Stopped by: Debroah Loop, PA-C ?  ? ?  ? ?TAKE these medications   ? ?aspirin EC 81 MG tablet ?Take 81 mg by mouth daily. ?  ?latanoprost 0.005 % ophthalmic solution ?Commonly known as: XALATAN ?Place 1 drop into both eyes at bedtime. ?  ?lisinopril 40 MG tablet ?Commonly known as: ZESTRIL ?Take 40 mg by mouth daily. ?  ?rosuvastatin 10 MG tablet ?Commonly known as: CRESTOR ?Take 1 tablet by mouth daily. ?  ?sertraline 100 MG tablet ?Commonly known as: ZOLOFT ?Take 1 tablet (100 mg total) by mouth daily. ?  ?spironolactone 25 MG tablet ?Commonly known as: ALDACTONE ?Take 25 mg by mouth daily. ?  ?tamsulosin 0.4 MG Caps capsule ?Commonly known as: FLOMAX ?TAKE 1 CAPSULE BY MOUTH ONCE DAILY WHILEPASSING STONES ?  ?Vitamin D 50 MCG (2000 UT) Caps ?Take 2,000 Units by mouth daily. ?  ? ?  ? ? ?Allergies:  ?Allergies  ?Allergen Reactions  ? Atorvastatin Other (See Comments)  ?  Muscle aches. ?Other reaction(s): Other (See Comments) ?Muscle aches. ?Muscle pain  ? Pregabalin Other (See Comments)  ?  Muscle soreness ?Other reaction(s): Other (See Comments) ?Muscle soreness ?Severe muscle pain  ? ? ?Family History: ?Family History  ?Problem Relation Age of Onset  ? Heart attack Mother   ? Brain cancer Father   ? Heart attack Sister    ? Congenital heart disease Sister   ? Leukemia Paternal Grandmother   ? COPD Brother   ? Heart disease Brother   ? Prostate cancer Neg Hx   ? Kidney cancer Neg Hx   ? Bladder Cancer Neg Hx   ? ? ?Social History:  ? reports that he has never smoked. He has never used smokeless tobacco. He reports that he does not drink alcohol and does not use drugs. ? ?Physical Exam: ?BP 116/70   Pulse 73   Ht '5\' 7"'$  (1.702 m)   Wt 265 lb (120.2 kg)   BMI 41.50 kg/m?   ?Constitutional:  Alert and oriented, no acute distress, nontoxic appearing ?HEENT: [place], AT ?Cardiovascular: No clubbing, cyanosis, or edema ?Respiratory: Normal respiratory effort, no increased work of breathing ?Skin: No rashes, bruises or suspicious lesions ?Neurologic: Grossly intact, no focal deficits, moving all 4 extremities ?Psychiatric: Normal mood and affect ? ?Assessment & Plan:   ?1. Recurrent nephrolithiasis ?Urine volume has improved, hyperoxaluria has improved, and hypocitraturia has improved off  potassiums citrate supplementation.  He remains on spironolactone and hyperkalemia has resolved.  With minimal patient bother, I am hesitant to tweak his regimen at this point.  We discussed continuing to stay well-hydrated with plans for close follow-up moving forward.  Patient is in agreement with this plan. ?- Abdomen 1 view (KUB); Future ? ?Return in about 6 months (around 03/04/2022) for Stone f/u with KUB prior. ? ?Debroah Loop, PA-C ? ?[facility] ?[address] ?[place], [place] [address] ?[phone]   ?

## 2021-09-06 ENCOUNTER — Ambulatory Visit: Payer: Medicare Other | Admitting: Urology

## 2021-12-19 ENCOUNTER — Other Ambulatory Visit: Payer: Self-pay | Admitting: Urology

## 2021-12-19 DIAGNOSIS — N200 Calculus of kidney: Secondary | ICD-10-CM

## 2022-02-08 ENCOUNTER — Other Ambulatory Visit: Payer: Self-pay | Admitting: Internal Medicine

## 2022-02-08 DIAGNOSIS — R7989 Other specified abnormal findings of blood chemistry: Secondary | ICD-10-CM

## 2022-02-13 ENCOUNTER — Ambulatory Visit
Admission: RE | Admit: 2022-02-13 | Discharge: 2022-02-13 | Disposition: A | Discharge status: Home / Self Care | Payer: Medicare Other | Source: Ambulatory Visit | Attending: Internal Medicine | Admitting: Internal Medicine

## 2022-02-13 DIAGNOSIS — R7989 Other specified abnormal findings of blood chemistry: Secondary | ICD-10-CM | POA: Insufficient documentation

## 2022-03-06 ENCOUNTER — Ambulatory Visit
Admission: RE | Admit: 2022-03-06 | Discharge: 2022-03-06 | Disposition: A | Discharge status: Home / Self Care | Payer: Medicare Other | Source: Ambulatory Visit | Attending: Physician Assistant | Admitting: Physician Assistant

## 2022-03-06 ENCOUNTER — Ambulatory Visit
Admission: RE | Admit: 2022-03-06 | Discharge: 2022-03-06 | Disposition: A | Discharge status: Home / Self Care | Payer: Medicare Other | Attending: Physician Assistant | Admitting: Physician Assistant

## 2022-03-06 ENCOUNTER — Ambulatory Visit (INDEPENDENT_AMBULATORY_CARE_PROVIDER_SITE_OTHER): Payer: Medicare Other | Admitting: Physician Assistant

## 2022-03-06 VITALS — BP 120/73 | HR 52 | Ht 67.0 in | Wt 265.0 lb

## 2022-03-06 DIAGNOSIS — N200 Calculus of kidney: Secondary | ICD-10-CM | POA: Insufficient documentation

## 2022-03-06 NOTE — Progress Notes (Signed)
03/06/2022 9:36 AM   William Armstrong 1950/09/06 [id number]  CC: Chief Complaint  Patient presents with   Follow-up    Follow up with KUB     HPI: William Armstrong is a 71 y.o. male with PMH recurrent nephrolithiasis and bariatric surgeries including gastric sleeve and duodenal switch who had to stop potassiums citrate due to hyperkalemia who presents today for 15-monthstone follow-up.   Today he reports he has passed a couple of small stones on his own at home in the past 6 months, which she does not bring with him to clinic today.  He is currently asymptomatic.  He has been doing his best to stay well-hydrated.  He does admit to some occasional back pain, but believes this is musculoskeletal and states it typically is worse around this time of year because he is doing more yard work than usual.  KUB today with rather stable appearing bilateral nonobstructing stone burden.  His right renal stone appears slightly larger compared to prior.  No evidence of radiopaque ureteral stones.  PMH: Past Medical History:  Diagnosis Date   Anemia    Arthritis    BCC (basal cell carcinoma of skin)    CHF (congestive heart failure) (HCC)    Complication of anesthesia    neck hurt for 2-3 days after general anesthesia   COPD (chronic obstructive pulmonary disease) (HCC)    Coronary artery disease    Depression    Glaucoma    [place]    [place]   History of kidney stones    HTN (hypertension)    Hyperglycemia    pt denies   Hypertension    Hypokalemia    Obesity    morbid   Sleep apnea    osa, CPAP has been dc'd after 249 lb weight loss    Surgical History: Past Surgical History:  Procedure Laterality Date   CORONARY STENT INTERVENTION N/A 11/10/2019   Procedure: CORONARY STENT INTERVENTION;  Surgeon: PIsaias Cowman MD;  [facility];  Service: Cardiovascular;  Laterality: N/A;   CORONARY STENT PLACEMENT  02/2012   CYSTOSCOPY W/ RETROGRADES Left  11/18/2016   Procedure: CYSTOSCOPY WITH RETROGRADE PYELOGRAM;  Surgeon: WIrine Seal MD;  Location: [facility];  Service: Urology;  Laterality: Left;   CYSTOSCOPY WITH STENT PLACEMENT Left 11/18/2016   Procedure: CYSTOSCOPY WITH STENT PLACEMENT;  Surgeon: WIrine Seal MD;  Location: [facility];  Service: Urology;  Laterality: Left;   CYSTOSCOPY/URETEROSCOPY/HOLMIUM LASER/STENT PLACEMENT Left 11/29/2016   Procedure: CYSTOSCOPY/URETEROSCOPY/HOLMIUM LASER/STENT EXCHANGE;  Surgeon: BHollice Espy MD;  Location: [facility];  Service: Urology;  Laterality: Left;   CYSTOSCOPY/URETEROSCOPY/HOLMIUM LASER/STENT PLACEMENT Right 03/07/2017   Procedure: CYSTOSCOPY/URETEROSCOPY/HOLMIUM LASER/STENT PLACEMENT;  Surgeon: BHollice Espy MD;  Location: [facility];  Service: Urology;  Laterality: Right;   EXTRACORPOREAL SHOCK WAVE LITHOTRIPSY Left 02/03/2021   Procedure: EXTRACORPOREAL SHOCK WAVE LITHOTRIPSY (ESWL);  Surgeon: BHollice Espy MD;  Location: [facility];  Service: Urology;  Laterality: Left;   JOINT REPLACEMENT     right hip   LAPAROSCOPIC GASTRIC RESTRICTIVE DUODENAL PROCEDURE (DUODENAL SWITCH)     LAPAROSCOPIC GASTRIC SLEEVE RESECTION  08/21/2013   LEFT HEART CATH AND CORONARY ANGIOGRAPHY N/A 11/10/2019   Procedure: LEFT HEART CATH AND CORONARY ANGIOGRAPHY;  Surgeon: FTeodoro Spray MD;  [facility];  Service: Cardiovascular;  Laterality: N/A;   skin removal surgery     removal of extra skin approx 20 lbs   STONE EXTRACTION WITH BASKET Right  03/07/2017   Procedure: STONE EXTRACTION WITH BASKET;  Surgeon: Hollice Espy, MD;  Location: [facility];  Service: Urology;  Laterality: Right;   TONSILLECTOMY AND ADENOIDECTOMY  1960   TOTAL HIP ARTHROPLASTY Right 01/18/2017   Procedure: TOTAL HIP ARTHROPLASTY ANTERIOR APPROACH;  Surgeon: Hessie Knows, MD;  Location: [facility];  Service: Orthopedics;  Laterality: Right;    Home Medications:  Allergies as of 03/06/2022       Reactions   Atorvastatin  Other (See Comments)   Muscle aches. Other reaction(s): Other (See Comments) Muscle aches. Muscle pain   Pregabalin Other (See Comments)   Muscle soreness Other reaction(s): Other (See Comments) Muscle soreness Severe muscle pain        Medication List        Accurate as of March 06, 2022  9:36 AM. If you have any questions, ask your nurse or doctor.          aspirin EC 81 MG tablet Take 81 mg by mouth daily.   latanoprost 0.005 % ophthalmic solution Commonly known as: XALATAN Place 1 drop into both eyes at bedtime.   lisinopril 40 MG tablet Commonly known as: ZESTRIL Take 40 mg by mouth daily.   rosuvastatin 10 MG tablet Commonly known as: CRESTOR Take 1 tablet by mouth daily.   sertraline 100 MG tablet Commonly known as: ZOLOFT Take 1 tablet (100 mg total) by mouth daily.   spironolactone 25 MG tablet Commonly known as: ALDACTONE Take 25 mg by mouth daily.   tamsulosin 0.4 MG Caps capsule Commonly known as: FLOMAX TAKE 1 CAPSULE BY MOUTH ONCE DAILY WHILEPASSING STONES   Vitamin D 50 MCG (2000 UT) Caps Take 2,000 Units by mouth daily.        Allergies:  Allergies  Allergen Reactions   Atorvastatin Other (See Comments)    Muscle aches. Other reaction(s): Other (See Comments) Muscle aches. Muscle pain   Pregabalin Other (See Comments)    Muscle soreness Other reaction(s): Other (See Comments) Muscle soreness Severe muscle pain    Family History: Family History  Problem Relation Age of Onset   Heart attack Mother    Brain cancer Father    Heart attack Sister    Congenital heart disease Sister    Leukemia Paternal Grandmother    COPD Brother    Heart disease Brother    Prostate cancer Neg Hx    Kidney cancer Neg Hx    Bladder Cancer Neg Hx     Social History:   reports that he has never smoked. He has never used smokeless tobacco. He reports that he does not drink alcohol and does not use drugs.  Physical Exam: BP 120/73    Pulse (!) 52   Ht '5\' 7"'$  (1.702 m)   Wt 265 lb (120.2 kg)   BMI 41.50 kg/m   Constitutional:  Alert and oriented, no acute distress, nontoxic appearing HEENT: [place], AT Cardiovascular: No clubbing, cyanosis, or edema Respiratory: Normal respiratory effort, no increased work of breathing Skin: No rashes, bruises or suspicious lesions Neurologic: Grossly intact, no focal deficits, moving all 4 extremities Psychiatric: Normal mood and affect  Pertinent Imaging: KUB, 03/06/2022: CLINICAL DATA:  Follow-up for bilateral kidney stones.   EXAM: ABDOMEN - 1 VIEW   COMPARISON:  Abdomen 02/22/2021.  Renal CT 01/24/2021.   FINDINGS: Surgical sutures noted over the upper abdomen. Large amount of stool noted throughout the colon. No bowel distention. Hemidiaphragms incompletely imaged. No free air identified. Bilateral left for lithiasis again  noted. Right kidney stone has increased in size and now measures 11 mm. Stable left lower renal pole 8 mm stone. Multiple pelvic calcifications again noted, most consistent phleboliths. Aortoiliac atherosclerotic vascular calcification. Diffuse osteopenia. Degenerative change lumbar spine and left hip. Total right hip replacement.   IMPRESSION: 1. Bilateral nephrolithiasis again noted. Right kidney stone has increased in size and now measures 11 mm. Stable left lower renal pole 8 mm stone. 2. Large amount of stool noted throughout the colon. No bowel distention.     Electronically Signed   By: Marcello Moores  Register M.D.   On: 03/07/2022 08:34  I personally reviewed the images referenced above and note rather stable bilateral nonobstructing renal stones with some slight interval enlargement in his right renal stone.  Assessment & Plan:   1. Recurrent nephrolithiasis KUB today with only small changes in his right renal stone size.  Overall, his imaging appears rather stable.  He continues to pass stones on his own without difficulty.  Unfortunately, we  are unable to keep him on potassiums citrate due to hyperkalemia.  We will continue close follow-up with repeat KUB in 6 months.  He is in agreement with this plan.  Return in about 6 months (around 09/05/2022) for Stone visit with KUB prior.  Debroah Loop, PA-C  [facility] [address], [place], [place] [address] [phone]

## 2022-05-22 ENCOUNTER — Ambulatory Visit: Payer: Medicare Other | Admitting: Physician Assistant

## 2022-05-23 ENCOUNTER — Telehealth: Payer: Self-pay | Admitting: *Deleted

## 2022-05-23 DIAGNOSIS — N200 Calculus of kidney: Secondary | ICD-10-CM

## 2022-05-23 NOTE — Telephone Encounter (Signed)
Pt calling stating that he has blood in his urine this morning, pt states this has been going on for 2 weeks. I offered pt appt yesterday and he declined. Pt now asking to be seen so he won't " bleed to death"  Your schedule is full, please advise

## 2022-05-23 NOTE — Progress Notes (Signed)
05/24/2022 10:47 AM   [name] 1951/01/21 [id number]  Referring provider: Baxter Hire, MD [place],  [place] [id number]  Urological history: 1.  Nephrolithiasis -Stone composition of calcium oxalate -CT renal stone study (01/2021) - Multiple renal calculi are again seen bilaterally, largest in the lower pole of the left kidney measuring 10 mm  2. Renal cyst  -CT renal stone study (01/2021) - too small right renal cysts   3. Prostate cancer screening -PSA (07/2021) 0.23   Chief Complaint  Patient presents with   Nephrolithiasis    HPI: William Armstrong is a 71 y.o. male who presents today for passing blood clots.   He has been experiencing brown-colored urine for the last week, but on Sunday it turned bright red.  Yesterday afternoon he started experiencing right lower quadrant pain that traveled into the right groin.  It was associated with quarter size blood clots and gross hematuria.  He did ultimately passed 3 stones since yesterday and this morning.  He states this morning his urine is now yellow cloudy.    He brought the stones in today.     Patient denies any modifying or aggravating factors.  Patient denies any dysuria or suprapubic/flank pain.  Patient denies any fevers, chills, nausea or vomiting.    KUB with bilateral stones  UA yellow slightly cloudy, > 1.030, 3+blood, pH 5.5, 3+protein, nitrite positive, 0-5 WBC's, > 30 RBC's, 0-10 epithelial cells and many bacteria.    PVR 0 mL   PMH: Past Medical History:  Diagnosis Date   Anemia    Arthritis    BCC (basal cell carcinoma of skin)    CHF (congestive heart failure) (HCC)    Complication of anesthesia    neck hurt for 2-3 days after general anesthesia   COPD (chronic obstructive pulmonary disease) (HCC)    Coronary artery disease    Depression    Glaucoma    Heart attack ([place])    2013   History of kidney stones    HTN (hypertension)    Hyperglycemia    pt denies    Hypertension    Hypokalemia    Obesity    morbid   Sleep apnea    osa, CPAP has been dc'd after 249 lb weight loss    Surgical History: Past Surgical History:  Procedure Laterality Date   CORONARY STENT INTERVENTION N/A 11/10/2019   Procedure: CORONARY STENT INTERVENTION;  Surgeon: Isaias Cowman, MD;  Location: [facility];  Service: Cardiovascular;  Laterality: N/A;   CORONARY STENT PLACEMENT  02/2012   CYSTOSCOPY W/ RETROGRADES Left 11/18/2016   Procedure: CYSTOSCOPY WITH RETROGRADE PYELOGRAM;  Surgeon: Irine Seal, MD;  Location: [facility];  Service: Urology;  Laterality: Left;   CYSTOSCOPY WITH STENT PLACEMENT Left 11/18/2016   Procedure: CYSTOSCOPY WITH STENT PLACEMENT;  Surgeon: Irine Seal, MD;  Location: [facility];  Service: Urology;  Laterality: Left;   CYSTOSCOPY/URETEROSCOPY/HOLMIUM LASER/STENT PLACEMENT Left 11/29/2016   Procedure: CYSTOSCOPY/URETEROSCOPY/HOLMIUM LASER/STENT EXCHANGE;  Surgeon: Hollice Espy, MD;  Location: [facility];  Service: Urology;  Laterality: Left;   CYSTOSCOPY/URETEROSCOPY/HOLMIUM LASER/STENT PLACEMENT Right 03/07/2017   Procedure: CYSTOSCOPY/URETEROSCOPY/HOLMIUM LASER/STENT PLACEMENT;  Surgeon: Hollice Espy, MD;  Location: [facility];  Service: Urology;  Laterality: Right;   EXTRACORPOREAL SHOCK WAVE LITHOTRIPSY Left 02/03/2021   Procedure: EXTRACORPOREAL SHOCK WAVE LITHOTRIPSY (ESWL);  Surgeon: Hollice Espy, MD;  Location: [facility];  Service: Urology;  Laterality: Left;   JOINT REPLACEMENT  right hip   LAPAROSCOPIC GASTRIC RESTRICTIVE DUODENAL PROCEDURE (DUODENAL SWITCH)     LAPAROSCOPIC GASTRIC SLEEVE RESECTION  08/21/2013   LEFT HEART CATH AND CORONARY ANGIOGRAPHY N/A 11/10/2019   Procedure: LEFT HEART CATH AND CORONARY ANGIOGRAPHY;  Surgeon: Teodoro Spray, MD;  Location: [facility];  Service: Cardiovascular;  Laterality: N/A;   skin removal surgery     removal of extra skin approx 20 lbs   STONE EXTRACTION WITH BASKET  Right 03/07/2017   Procedure: STONE EXTRACTION WITH BASKET;  Surgeon: Hollice Espy, MD;  Location: [facility];  Service: Urology;  Laterality: Right;   TONSILLECTOMY AND ADENOIDECTOMY  1960   TOTAL HIP ARTHROPLASTY Right 01/18/2017   Procedure: TOTAL HIP ARTHROPLASTY ANTERIOR APPROACH;  Surgeon: Hessie Knows, MD;  Location: [facility];  Service: Orthopedics;  Laterality: Right;    Home Medications:  Allergies as of 05/24/2022       Reactions   Atorvastatin Other (See Comments)   Muscle aches. Other reaction(s): Other (See Comments) Muscle aches. Muscle pain   Pregabalin Other (See Comments)   Muscle soreness Other reaction(s): Other (See Comments) Muscle soreness Severe muscle pain        Medication List        Accurate as of May 24, 2022 10:47 AM. If you have any questions, ask your nurse or doctor.          aspirin EC 81 MG tablet Take 81 mg by mouth daily.   latanoprost 0.005 % ophthalmic solution Commonly known as: XALATAN Place 1 drop into both eyes at bedtime.   lisinopril 40 MG tablet Commonly known as: ZESTRIL Take 40 mg by mouth daily.   rosuvastatin 10 MG tablet Commonly known as: CRESTOR Take 1 tablet by mouth daily.   sertraline 100 MG tablet Commonly known as: ZOLOFT Take 1 tablet (100 mg total) by mouth daily.   spironolactone 25 MG tablet Commonly known as: ALDACTONE Take 25 mg by mouth daily.   sulfamethoxazole-trimethoprim 800-160 MG tablet Commonly known as: BACTRIM DS Take 1 tablet by mouth every 12 (twelve) hours. Started by: Zara Council, PA-C   tamsulosin 0.4 MG Caps capsule Commonly known as: FLOMAX TAKE 1 CAPSULE BY MOUTH ONCE DAILY WHILEPASSING STONES   Vitamin D 50 MCG (2000 UT) Caps Take 2,000 Units by mouth daily.        Allergies:  Allergies  Allergen Reactions   Atorvastatin Other (See Comments)    Muscle aches. Other reaction(s): Other (See Comments) Muscle aches. Muscle pain   Pregabalin Other (See  Comments)    Muscle soreness Other reaction(s): Other (See Comments) Muscle soreness Severe muscle pain    Family History: Family History  Problem Relation Age of Onset   Heart attack Mother    Brain cancer Father    Heart attack Sister    Congenital heart disease Sister    Leukemia Paternal Grandmother    COPD Brother    Heart disease Brother    Prostate cancer Neg Hx    Kidney cancer Neg Hx    Bladder Cancer Neg Hx     Social History:  reports that he has never smoked. He has never used smokeless tobacco. He reports that he does not drink alcohol and does not use drugs.  ROS: Pertinent ROS in HPI  Physical Exam: BP 134/75   Pulse 79   Ht '5\' 7"'$  (1.702 m)   Wt 258 lb (117 kg)   BMI 40.41 kg/m   Constitutional:  Well nourished.  Alert and oriented, No acute distress. HEENT: [place] AT, moist mucus membranes.  Trachea midline Cardiovascular: No clubbing, cyanosis, or edema. Respiratory: Normal respiratory effort, no increased work of breathing. Neurologic: Grossly intact, no focal deficits, moving all 4 extremities. Psychiatric: Normal mood and affect.  Laboratory Data: Serum creatinine (01/2022) 0.8 Hemoglobin A1c (11/2021) 5.5   Urinalysis See epic and HPI I have reviewed the labs.   Pertinent Imaging: CLINICAL DATA:  Right-sided pain.   EXAM: ABDOMEN - 1 VIEW   COMPARISON:  Abdominal x-ray 03/06/2022   FINDINGS: Three right renal calculi are visualized measuring up to 15 mm. These have increased in number when compared to prior. Linear left renal calculus measuring 6 mm is unchanged. Phleboliths are again noted in the pelvis.   Bowel-gas pattern is nonobstructive. Right hip arthroplasty is again seen.   IMPRESSION: Bilateral renal calculi, increased in number on the right when compared to prior exam.     Electronically Signed   By: Ronney Asters M.D.   On: 05/25/2022 23:48   I have independently reviewed the films.    Assessment & Plan:    1.  Gross hematuria -Likely due to passage of stones -KUB stable bilateral stones -UA greater than 30 RBC's -urine sent for culture -Started on Septra DS empirically will adjust if necessary once urine culture sensitivities have returned -Reviewed return precautions -Has a return appointment in April with Sam and he is instructed to keep that appointment  2.  Nephrolithiasis -He does not desire to have these current stones sent for analysis  3.  Suspected UTI -UA nitrate positive with micro heme and bacteria -Urine culture pending -Septra DS started   Return for keep follow up with Sam .  These notes generated with voice recognition software. I apologize for typographical errors.  [place], [place] [address]  [place], [place] [address] [phone]

## 2022-05-23 NOTE — Telephone Encounter (Signed)
Pt called back and got on shannon schedule

## 2022-05-24 ENCOUNTER — Ambulatory Visit: Payer: Medicare Other | Admitting: Urology

## 2022-05-24 ENCOUNTER — Ambulatory Visit
Admission: RE | Admit: 2022-05-24 | Discharge: 2022-05-24 | Disposition: A | Discharge status: Home / Self Care | Payer: Medicare Other | Source: Ambulatory Visit | Attending: Urology | Admitting: Urology

## 2022-05-24 ENCOUNTER — Encounter: Payer: Self-pay | Admitting: Urology

## 2022-05-24 VITALS — BP 134/75 | HR 79 | Ht 67.0 in | Wt 258.0 lb

## 2022-05-24 DIAGNOSIS — R310 Gross hematuria: Secondary | ICD-10-CM | POA: Diagnosis not present

## 2022-05-24 DIAGNOSIS — N200 Calculus of kidney: Secondary | ICD-10-CM | POA: Diagnosis present

## 2022-05-24 DIAGNOSIS — R3989 Other symptoms and signs involving the genitourinary system: Secondary | ICD-10-CM

## 2022-05-24 LAB — URINALYSIS, COMPLETE
Bilirubin, UA: NEGATIVE
Glucose, UA: NEGATIVE
Ketones, UA: NEGATIVE
Leukocytes,UA: NEGATIVE
Nitrite, UA: POSITIVE — AB
Specific Gravity, UA: 1.03 (ref 1.005–1.030)
Urobilinogen, Ur: 0.2 mg/dL (ref 0.2–1.0)
pH, UA: 5.5 (ref 5.0–7.5)

## 2022-05-24 LAB — MICROSCOPIC EXAMINATION: RBC, Urine: >30 /hpf — AB (ref 0–2)

## 2022-05-24 MED ORDER — SULFAMETHOXAZOLE-TRIMETHOPRIM 800-160 MG PO TABS: 1.0000 | ORAL_TABLET | Freq: Two times a day (BID) | ORAL | 0 refills | Status: DC

## 2022-05-30 ENCOUNTER — Other Ambulatory Visit: Payer: Self-pay | Admitting: Urology

## 2022-05-30 ENCOUNTER — Ambulatory Visit: Payer: Medicare Other | Admitting: Physician Assistant

## 2022-05-30 DIAGNOSIS — N200 Calculus of kidney: Secondary | ICD-10-CM

## 2022-05-31 LAB — CULTURE, URINE COMPREHENSIVE

## 2022-06-01 ENCOUNTER — Telehealth: Payer: Self-pay | Admitting: *Deleted

## 2022-06-01 NOTE — Telephone Encounter (Signed)
-----   Message from Debroah Loop, [place] sent at 06/01/2022  4:27 PM EST ----- Can you please call and check his symptoms? If he's still symptomatic, recommend switching his abx per culture results. Let's do Levaquin '250mg'$  daily x5 days. If asymptomatic, can defer additional abx.

## 2022-06-02 ENCOUNTER — Other Ambulatory Visit: Payer: Self-pay | Admitting: Physician Assistant

## 2022-06-02 DIAGNOSIS — R310 Gross hematuria: Secondary | ICD-10-CM

## 2022-06-02 NOTE — Telephone Encounter (Signed)
Patient is still taking flomax, he don't think he has passed any stones. Still the same amount of blood no fever or chills . Sam is ordering a scan and I will scheduled a two week f/u from scan .Marland Kitchen

## 2022-06-08 ENCOUNTER — Ambulatory Visit
Admission: RE | Admit: 2022-06-08 | Discharge: 2022-06-08 | Disposition: A | Discharge status: Home / Self Care | Payer: Medicare Other | Source: Ambulatory Visit | Attending: Physician Assistant | Admitting: Physician Assistant

## 2022-06-08 DIAGNOSIS — R310 Gross hematuria: Secondary | ICD-10-CM | POA: Insufficient documentation

## 2022-06-13 ENCOUNTER — Other Ambulatory Visit: Payer: Self-pay | Admitting: Physician Assistant

## 2022-06-13 ENCOUNTER — Ambulatory Visit: Payer: Medicare Other | Admitting: Physician Assistant

## 2022-06-13 ENCOUNTER — Encounter: Payer: Self-pay | Admitting: Physician Assistant

## 2022-06-13 VITALS — BP 111/74 | HR 67 | Ht 67.0 in | Wt 255.0 lb

## 2022-06-13 DIAGNOSIS — N201 Calculus of ureter: Secondary | ICD-10-CM

## 2022-06-13 DIAGNOSIS — R310 Gross hematuria: Secondary | ICD-10-CM

## 2022-06-13 LAB — URINALYSIS, COMPLETE
Bilirubin, UA: NEGATIVE
Glucose, UA: NEGATIVE
Ketones, UA: NEGATIVE
Leukocytes,UA: NEGATIVE
Nitrite, UA: NEGATIVE
Specific Gravity, UA: 1.03 (ref 1.005–1.030)
Urobilinogen, Ur: 0.2 mg/dL (ref 0.2–1.0)
pH, UA: 5 (ref 5.0–7.5)

## 2022-06-13 LAB — MICROSCOPIC EXAMINATION: RBC, Urine: >30 /hpf — AB (ref 0–2)

## 2022-06-13 NOTE — Progress Notes (Signed)
REQUEST FOR SURGICAL CLEARANCE       Date: Date: 06/13/22  Faxed to: Dr. Laurance Flatten  Surgeon: Dr. John Giovanni     Date of Surgery: 06/22/2022- TBD based on Clearance  Operation: right Extracorporeal shock wave lithotripsy   Anesthesia Type: Moderate   Diagnosis: Right Ureteral Stone  Patient Requires:   Cardiac / Vascular Clearance : Yes  Reason: Patient will need to hold Plavix for 5 days and ASA for 72 hours.     Risk Assessment:    Low   '[]'$       Moderate   '[]'$     High   '[]'$           This patient is optimized for surgery  YES '[]'$       NO   '[]'$    I recommend further assessment/workup prior to surgery. YES '[]'$      NO  '[]'$   Appointment scheduled for: _______________________   Further recommendations: ____________________________________     Physician Signature:__________________________________   Printed Name: ________________________________________   Date: _________________

## 2022-06-13 NOTE — Progress Notes (Unsigned)
06/13/2022 11:00 AM   William Armstrong [birth year] [id number]  CC: Chief Complaint  Patient presents with   Nephrolithiasis   HPI: William Armstrong is a 72 y.o. 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  He is accompanied today by his wife.  He saw Michiel Cowboy in clinic on 05/24/2022 with reports of gross hematuria.  He had spontaneously passed 3 stones and his gross hematuria had resolved at the time that he was seen.  UA was suspicious for UTI and he was started on empiric Bactrim.  Urine culture finalized with low colony counts of Staph epidermidis.  He deferred additional antibiotics but reported ongoing hematuria, at which point a CT was ordered.  CT stone study dated 06/08/2022 was notable for a 14 x 15 mm right UPJ stone without hydronephrosis.  Otherwise, there was bilateral nonobstructing nephrolithiasis.  On comparison to KUB from his last clinic visit with me on 03/06/2022, it appears the stone has shifted from the right renal pelvis.  Today he reports no flank pain, fever, chills, nausea, or vomiting. He continues to have intermittent gross hematuria. He spontaneously passed an approximate 2x79mm stone at home several days ago.  Stone is visible on KUB.  Maximum stone density ~1200 HU.  Skin to stone distance ~13 cm.  In-office UA today positive for 3+ blood and 2+ protein; urine microscopy with >30 RBCs/HPF, and calcium oxalate crystals, and amorphous crystals.  PMH: Past Medical History:  Diagnosis Date   Anemia    Arthritis    BCC (basal cell carcinoma of skin)    CHF (congestive heart failure) (HCC)    Complication of anesthesia    neck hurt for 2-3 days after general anesthesia   COPD (chronic obstructive pulmonary disease) (HCC)    Coronary artery disease    Depression     Glaucoma    Heart attack (HCC)    2013   History of kidney stones    HTN (hypertension)    Hyperglycemia    pt denies   Hypertension    Hypokalemia    Obesity    morbid   Sleep apnea    osa, CPAP has been dc'd after 249 lb weight loss    Surgical History: Past Surgical History:  Procedure Laterality Date   CORONARY STENT INTERVENTION N/A 11/10/2019   Procedure: CORONARY STENT INTERVENTION;  Surgeon: Marcina Millard, MD;  Location: [facility];  Service: Cardiovascular;  Laterality: N/A;   CORONARY STENT PLACEMENT  02/2012   CYSTOSCOPY W/ RETROGRADES Left 11/18/2016   Procedure: CYSTOSCOPY WITH RETROGRADE PYELOGRAM;  Surgeon: Bjorn Pippin, MD;  Location: [facility];  Service: Urology;  Laterality: Left;   CYSTOSCOPY WITH STENT PLACEMENT Left 11/18/2016   Procedure: CYSTOSCOPY WITH STENT PLACEMENT;  Surgeon: Bjorn Pippin, MD;  Location: [facility];  Service: Urology;  Laterality: Left;   CYSTOSCOPY/URETEROSCOPY/HOLMIUM LASER/STENT PLACEMENT Left 11/29/2016   Procedure: CYSTOSCOPY/URETEROSCOPY/HOLMIUM LASER/STENT EXCHANGE;  Surgeon: Vanna Scotland, MD;  Location: [facility];  Service: Urology;  Laterality: Left;   CYSTOSCOPY/URETEROSCOPY/HOLMIUM LASER/STENT PLACEMENT Right 03/07/2017   Procedure: CYSTOSCOPY/URETEROSCOPY/HOLMIUM LASER/STENT PLACEMENT;  Surgeon: Vanna Scotland, MD;  Location: [facility];  Service: Urology;  Laterality: Right;   EXTRACORPOREAL SHOCK WAVE LITHOTRIPSY Left 02/03/2021   Procedure: EXTRACORPOREAL SHOCK WAVE LITHOTRIPSY (ESWL);  Surgeon: Vanna Scotland, MD;  Location: [facility];  Service: Urology;  Laterality: Left;   JOINT REPLACEMENT     right hip   LAPAROSCOPIC GASTRIC RESTRICTIVE DUODENAL PROCEDURE (DUODENAL SWITCH)     LAPAROSCOPIC GASTRIC SLEEVE RESECTION  08/21/2013   LEFT HEART CATH AND CORONARY ANGIOGRAPHY N/A 11/10/2019   Procedure: LEFT HEART CATH AND CORONARY ANGIOGRAPHY;  Surgeon: Dalia Heading, MD;  Location: [facility];  Service:  Cardiovascular;  Laterality: N/A;   skin removal surgery     removal of extra skin approx 20 lbs   STONE EXTRACTION WITH BASKET Right 03/07/2017   Procedure: STONE EXTRACTION WITH BASKET;  Surgeon: Vanna Scotland, MD;  Location: [facility];  Service: Urology;  Laterality: Right;   TONSILLECTOMY AND ADENOIDECTOMY  1960   TOTAL HIP ARTHROPLASTY Right 01/18/2017   Procedure: TOTAL HIP ARTHROPLASTY ANTERIOR APPROACH;  Surgeon: Kennedy Bucker, MD;  Location: [facility];  Service: Orthopedics;  Laterality: Right;    Home Medications:  Allergies as of 06/13/2022       Reactions   Atorvastatin Other (See Comments)   Muscle aches. Other reaction(s): Other (See Comments) Muscle aches. Muscle pain   Pregabalin Other (See Comments)   Muscle soreness Other reaction(s): Other (See Comments) Muscle soreness Severe muscle pain        Medication List        Accurate as of June 13, 2022 11:00 AM. If you have any questions, ask your nurse or doctor.          STOP taking these medications    sulfamethoxazole-trimethoprim 800-160 MG tablet Commonly known as: BACTRIM DS Stopped by: William Ching, PA-C       TAKE these medications    aspirin EC 81 MG tablet Take 81 mg by mouth daily.   latanoprost 0.005 % ophthalmic solution Commonly known as: XALATAN Place 1 drop into both eyes at bedtime.   lisinopril 40 MG tablet Commonly known as: ZESTRIL Take 40 mg by mouth daily.   rosuvastatin 10 MG tablet Commonly known as: CRESTOR Take 1 tablet by mouth daily.   sertraline 100 MG tablet Commonly known as: ZOLOFT Take 1 tablet (100 mg total) by mouth daily.   spironolactone 25 MG tablet Commonly known as: ALDACTONE Take 25 mg by mouth daily.   tamsulosin 0.4 MG Caps capsule Commonly known as: FLOMAX TAKE 1 CAPSULE BY MOUTH ONCE DAILY WHILEPASSING STONES   Vitamin D 50 MCG (2000 UT) Caps Take 2,000 Units by mouth daily.        Allergies:  Allergies  Allergen  Reactions   Atorvastatin Other (See Comments)    Muscle aches. Other reaction(s): Other (See Comments) Muscle aches. Muscle pain   Pregabalin Other (See Comments)    Muscle soreness Other reaction(s): Other (See Comments) Muscle soreness Severe muscle pain    Family History: Family History  Problem Relation Age of Onset   Heart attack Mother    Brain cancer Father    Heart attack Sister    Congenital heart disease Sister    Leukemia Paternal Grandmother    COPD Brother    Heart disease Brother    Prostate cancer Neg Hx    Kidney cancer Neg Hx    Bladder Cancer Neg Hx     Social History:   reports that he has never smoked. He has never been exposed to tobacco smoke. He has never used smokeless tobacco. He reports that he does not drink alcohol and does not use drugs.  Physical Exam: BP 111/74   Pulse 67   Ht 5'  7" (1.702 m)   Wt 255 lb (115.7 kg)   BMI 39.94 kg/m   Constitutional:  Alert and oriented, no acute distress, nontoxic appearing HEENT: [place], AT Cardiovascular: No clubbing, cyanosis, or edema Respiratory: Normal respiratory effort, no increased work of breathing Skin: No rashes, bruises or suspicious lesions Neurologic: Grossly intact, no focal deficits, moving all 4 extremities Psychiatric: Normal mood and affect  Laboratory Data: Results for orders placed or performed in visit on 05/24/22  CULTURE, URINE COMPREHENSIVE   Specimen: Urine   UR  Result Value Ref Range   Urine Culture, Comprehensive Final report (A)    Organism ID, Bacteria Comment (A)    ANTIMICROBIAL SUSCEPTIBILITY Comment   Microscopic Examination   Urine  Result Value Ref Range   WBC, UA 0-5 0 - 5 /hpf   RBC, Urine >30 (A) 0 - 2 /hpf   Epithelial Cells (non renal) 0-10 0 - 10 /hpf   Bacteria, UA Many (A) None seen/Few  Urinalysis, Complete  Result Value Ref Range   Specific Gravity, UA 1.030 1.005 - 1.030   pH, UA 5.5 5.0 - 7.5   Color, UA Yellow Yellow   Appearance Ur Hazy  (A) Clear   Leukocytes,UA Negative Negative   Protein,UA 3+ (A) Negative/Trace   Glucose, UA Negative Negative   Ketones, UA Negative Negative   RBC, UA 3+ (A) Negative   Bilirubin, UA Negative Negative   Urobilinogen, Ur 0.2 0.2 - 1.0 mg/dL   Nitrite, UA Positive (A) Negative   Microscopic Examination See below:    Pertinent Imaging: Results for orders placed during the hospital encounter of 06/08/22  CT RENAL STONE STUDY  Narrative CLINICAL DATA:  Symptomatic urolithiasis.  Gross hematuria.  EXAM: CT ABDOMEN AND PELVIS WITHOUT CONTRAST  TECHNIQUE: Multidetector CT imaging of the abdomen and pelvis was performed following the standard protocol without IV contrast.  RADIATION DOSE REDUCTION: This exam was performed according to the departmental dose-optimization program which includes automated exposure control, adjustment of the mA and/or kV according to patient size and/or use of iterative reconstruction technique.  COMPARISON:  Most recent CT 01/24/2021  FINDINGS: Lower chest: Elevated right hemidiaphragm with compressive atelectasis in the lower and middle lobes. No pleural effusion. Dense coronary artery calcifications.  Hepatobiliary: Stable low-density lesions in the left lobe of the liver, previously characterized as cysts. Motion limits detailed assessment on the current exam. Mild gallbladder distention without calcified gallstone. Upper normal common bile duct at 7 mm.  Pancreas: Motion obscured.  No ductal dilatation or inflammation.  Spleen: Normal in size without focal abnormality.  Adrenals/Urinary Tract: No adrenal nodule. 14 x 15 mm stone in the right renal pelvis at the ureteropelvic junction. There is slight calyceal distention no frank hydronephrosis. Three dish in a small nonobstructing stones are seen in the mid lower right kidney. There are 4 punctate intrarenal calculi on the left. No left hydronephrosis. Faint symmetric bilateral  perinephric edema, similar to prior. Right renal cyst appears simple, no follow-up imaging is recommended. No evidence of ureteral stone, portions of the distal ureters are obscured by right hip arthroplasty. The urinary bladder is near completely empty and not well assessed. No urethral stone is seen  Stomach/Bowel: Small hiatal hernia. Prior bariatric surgery with gastric suture line and enteric sutures in the central small bowel. No bowel obstruction or inflammation. Normal appendix visualized. Moderate to large volume of colonic stool. The sigmoid colon is redundant and courses into the left mid abdomen. Mild distal left colonic  diverticulosis without diverticulitis.  Vascular/Lymphatic: Moderate aortic atherosclerosis. No aneurysm. No bulky adenopathy, allowing for motion.  Reproductive: Chronically dilated partially calcified right seminal vesicle. Unremarkable prostate.  Other: No ascites. Postsurgical changes the anterior abdominal wall, no abdominal wall hernia.  Musculoskeletal: Degenerative disc disease and facet hypertrophy in the lumbar spine. Right hip arthroplasty.  IMPRESSION: 1. A 14 x 15 mm stone in the right renal pelvis at the ureteropelvic junction with slight calyceal distention, likely intermittently obstructing. No frank hydronephrosis. 2. Additional nonobstructing bilateral intrarenal calculi. 3. Moderate to large volume of colonic stool, can be seen with constipation. Mild distal left colonic diverticulosis without diverticulitis. 4. Small hiatal hernia. 5. Additional stable chronic findings as described.   Electronically Signed By: Narda Rutherford M.D. On: 06/08/2022 20:53  I personally reviewed the images referenced above and note a 14x13mm right UPJ stone with no evidence of high-grade obstruction.  Assessment & Plan:   1. Gross hematuria Anticipated to be secondary to a large, 14 x 15 mm right UPJ stone.  Fortunately he is asymptomatic  aside from his gross hematuria and no evidence of high-grade obstruction on recent CT.  His UA today is rather bland, low suspicion for infection at this time.  Given stone size and migration from the right renal pelvis, I recommended pursuing definitive stone management.  We discussed various treatment options for his stone including ESWL vs. ureteroscopy with laser lithotripsy and stent. We discussed the risks and benefits of all procedures including bleeding, infection, and damage to surrounding structures.  We specifically discussed that ESWL is a less invasive procedure that requires less anesthesia, however patients have to pass their residual stone fragments, which may take several weeks and be associated with renal colic. Additionally, we discussed the limitations of ESWL including the low, 10-20% chance of treatment failure requiring repeat ESWL versus ureteroscopy in the future. By comparison, ureteroscopy is a more invasive surgical procedure that requires more anesthesia, but it does require placement of a ureteral stent, which will remain in place for approximately 3-10 days and can be associated with flank pain, bladder pain, dysuria, urgency, frequency, urinary leakage, and gross hematuria.  We also specifically discussed the possibility of requiring a staged approach with either procedure type given his stone size and density. We discussed that a second URS could be completed 1-2 weeks following the first but given his stone's location, a second ESWL would need to be delayed 6 weeks at least to reduce his risk for perinephric hematoma.  Regardless of procedure, we discussed that he will require cardiac clearance prior from Dr. Christell Constant.  Based on this conversation, he would like to proceed with ESWL. Orders placed; preop Ucx ordered. Will obtain cardiac clearance and tentatively plan for 1/18, however we discussed that we will need to defer the procedure if we have not received cardiac  clearance prior. He expressed understanding.  We discussed return precautions in the interim including severe pain, fever, chills, nausea, and vomiting. - Urinalysis, Complete - CULTURE, URINE COMPREHENSIVE   Return in about 9 days (around 06/22/2022) for Right ESWL (tentative, pending cardiac clearance prior).  William Ching, PA-C  [facility] [address], [address] [place], [place] [address] [phone]

## 2022-06-13 NOTE — Progress Notes (Signed)
ESWL ORDER FORM  Expected date of procedure: 06/22/2022 (Tentative pending cardiac clearance from Dr. Laurance Flatten)  Surgeon: John Giovanni, MD  Post op standing: 2-4wk follow up w/KUB prior  Anticoagulation/Aspirin/NSAID standing order: Hold aspirin 72 hours prior, Hold Plavix 5 days prior  Anesthesia standing order: MAC  VTE standing: SCD's  Dx: Right Ureteral Stone  Procedure: right Extracorporeal shock wave lithotripsy  CPT : 73578  Standing Order Set:   *NPO after mn, KUB  *NS 140m/hr, Keflex 5081mPO, Benadryl 2511mO, Valium 6m58m, Zofran 4mg 57m   Medications if other than standing orders:   NONE

## 2022-06-13 NOTE — H&P (View-Only) (Signed)
06/13/2022 11:00 AM   William Armstrong [birth year] [id number]  CC: Chief Complaint  Patient presents with   Nephrolithiasis   HPI: William Armstrong is a 72 y.o. 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  He is accompanied today by his wife.  He saw Zara Council in clinic on 05/24/2022 with reports of gross hematuria.  He had spontaneously passed 3 stones and his gross hematuria had resolved at the time that he was seen.  UA was suspicious for UTI and he was started on empiric Bactrim.  Urine culture finalized with low colony counts of Staph epidermidis.  He deferred additional antibiotics but reported ongoing hematuria, at which point a CT was ordered.  CT stone study dated 06/08/2022 was notable for a 14 x 15 mm right UPJ stone without hydronephrosis.  Otherwise, there was bilateral nonobstructing nephrolithiasis.  On comparison to KUB from his last clinic visit with me on 03/06/2022, it appears the stone has shifted from the right renal pelvis.  Today he reports no flank pain, fever, chills, nausea, or vomiting. He continues to have intermittent gross hematuria. He spontaneously passed an approximate 2x49m stone at home several days ago.  Stone is visible on KUB.  Maximum stone density ~1200 HU.  Skin to stone distance ~13 cm.  In-office UA today positive for 3+ blood and 2+ protein; urine microscopy with >30 RBCs/HPF, and calcium oxalate crystals, and amorphous crystals.  PMH: Past Medical History:  Diagnosis Date   Anemia    Arthritis    BCC (basal cell carcinoma of skin)    CHF (congestive heart failure) (HCC)    Complication of anesthesia    neck hurt for 2-3 days after general anesthesia   COPD (chronic obstructive pulmonary disease) (HCC)    Coronary artery disease    Depression     Glaucoma    Heart attack ([place]    2013   History of kidney stones    HTN (hypertension)    Hyperglycemia    pt denies   Hypertension    Hypokalemia    Obesity    morbid   Sleep apnea    osa, CPAP has been dc'd after 249 lb weight loss    Surgical History: Past Surgical History:  Procedure Laterality Date   CORONARY STENT INTERVENTION N/A 11/10/2019   Procedure: CORONARY STENT INTERVENTION;  Surgeon: PIsaias Cowman MD;  [facility];  Service: Cardiovascular;  Laterality: N/A;   CORONARY STENT PLACEMENT  02/2012   CYSTOSCOPY W/ RETROGRADES Left 11/18/2016   Procedure: CYSTOSCOPY WITH RETROGRADE PYELOGRAM;  Surgeon: WIrine Seal MD;  Location: [facility];  Service: Urology;  Laterality: Left;   CYSTOSCOPY WITH STENT PLACEMENT Left 11/18/2016   Procedure: CYSTOSCOPY WITH STENT PLACEMENT;  Surgeon: WIrine Seal MD;  Location: [facility];  Service: Urology;  Laterality: Left;   CYSTOSCOPY/URETEROSCOPY/HOLMIUM LASER/STENT PLACEMENT Left 11/29/2016   Procedure: CYSTOSCOPY/URETEROSCOPY/HOLMIUM LASER/STENT EXCHANGE;  Surgeon: BHollice Espy MD;  Location: [facility];  Service: Urology;  Laterality: Left;   CYSTOSCOPY/URETEROSCOPY/HOLMIUM LASER/STENT PLACEMENT Right 03/07/2017   Procedure: CYSTOSCOPY/URETEROSCOPY/HOLMIUM LASER/STENT PLACEMENT;  Surgeon: BHollice Espy MD;  Location: [facility];  Service: Urology;  Laterality: Right;   EXTRACORPOREAL SHOCK WAVE LITHOTRIPSY Left 02/03/2021   Procedure: EXTRACORPOREAL SHOCK WAVE LITHOTRIPSY (ESWL);  Surgeon: BHollice Espy MD;  Location: [facility];  Service: Urology;  Laterality: Left;   JOINT REPLACEMENT     right hip   LAPAROSCOPIC GASTRIC RESTRICTIVE DUODENAL PROCEDURE (DUODENAL SWITCH)     LAPAROSCOPIC GASTRIC SLEEVE RESECTION  08/21/2013   LEFT HEART CATH AND CORONARY ANGIOGRAPHY N/A 11/10/2019   Procedure: LEFT HEART CATH AND CORONARY ANGIOGRAPHY;  Surgeon: Teodoro Spray, MD;  Location: [facility];  Service:  Cardiovascular;  Laterality: N/A;   skin removal surgery     removal of extra skin approx 20 lbs   STONE EXTRACTION WITH BASKET Right 03/07/2017   Procedure: STONE EXTRACTION WITH BASKET;  Surgeon: Hollice Espy, MD;  Location: [facility];  Service: Urology;  Laterality: Right;   TONSILLECTOMY AND ADENOIDECTOMY  1960   TOTAL HIP ARTHROPLASTY Right 01/18/2017   Procedure: TOTAL HIP ARTHROPLASTY ANTERIOR APPROACH;  Surgeon: Hessie Knows, MD;  Location: [facility];  Service: Orthopedics;  Laterality: Right;    Home Medications:  Allergies as of 06/13/2022       Reactions   Atorvastatin Other (See Comments)   Muscle aches. Other reaction(s): Other (See Comments) Muscle aches. Muscle pain   Pregabalin Other (See Comments)   Muscle soreness Other reaction(s): Other (See Comments) Muscle soreness Severe muscle pain        Medication List        Accurate as of June 13, 2022 11:00 AM. If you have any questions, ask your nurse or doctor.          STOP taking these medications    sulfamethoxazole-trimethoprim 800-160 MG tablet Commonly known as: BACTRIM DS Stopped by: Debroah Loop, PA-C       TAKE these medications    aspirin EC 81 MG tablet Take 81 mg by mouth daily.   latanoprost 0.005 % ophthalmic solution Commonly known as: XALATAN Place 1 drop into both eyes at bedtime.   lisinopril 40 MG tablet Commonly known as: ZESTRIL Take 40 mg by mouth daily.   rosuvastatin 10 MG tablet Commonly known as: CRESTOR Take 1 tablet by mouth daily.   sertraline 100 MG tablet Commonly known as: ZOLOFT Take 1 tablet (100 mg total) by mouth daily.   spironolactone 25 MG tablet Commonly known as: ALDACTONE Take 25 mg by mouth daily.   tamsulosin 0.4 MG Caps capsule Commonly known as: FLOMAX TAKE 1 CAPSULE BY MOUTH ONCE DAILY WHILEPASSING STONES   Vitamin D 50 MCG (2000 UT) Caps Take 2,000 Units by mouth daily.        Allergies:  Allergies  Allergen  Reactions   Atorvastatin Other (See Comments)    Muscle aches. Other reaction(s): Other (See Comments) Muscle aches. Muscle pain   Pregabalin Other (See Comments)    Muscle soreness Other reaction(s): Other (See Comments) Muscle soreness Severe muscle pain    Family History: Family History  Problem Relation Age of Onset   Heart attack Mother    Brain cancer Father    Heart attack Sister    Congenital heart disease Sister    Leukemia Paternal Grandmother    COPD Brother    Heart disease Brother    Prostate cancer Neg Hx    Kidney cancer Neg Hx    Bladder Cancer Neg Hx     Social History:   reports that he has never smoked. He has never been exposed to tobacco smoke. He has never used smokeless tobacco. He reports that he does not drink alcohol and does not use drugs.  Physical Exam: BP 111/74   Pulse 67   Ht 5'  7" (1.702 m)   Wt 255 lb (115.7 kg)   BMI 39.94 kg/m   Constitutional:  Alert and oriented, no acute distress, nontoxic appearing HEENT: [place], AT Cardiovascular: No clubbing, cyanosis, or edema Respiratory: Normal respiratory effort, no increased work of breathing Skin: No rashes, bruises or suspicious lesions Neurologic: Grossly intact, no focal deficits, moving all 4 extremities Psychiatric: Normal mood and affect  Laboratory Data: Results for orders placed or performed in visit on 06/13/22  Microscopic Examination   Urine  Result Value Ref Range   WBC, UA 0-5 0 - 5 /hpf   RBC, Urine >30 (A) 0 - 2 /hpf   Epithelial Cells (non renal) 0-10 0 - 10 /hpf   Casts Present (A) None seen /lpf   Cast Type Hyaline casts N/A   Crystals Present (A) N/A   Crystal Type Calcium Oxalate N/A   Bacteria, UA Few None seen/Few  Urinalysis, Complete  Result Value Ref Range   Specific Gravity, UA 1.030 1.005 - 1.030   pH, UA 5.0 5.0 - 7.5   Color, UA Orange Yellow   Appearance Ur Cloudy (A) Clear   Leukocytes,UA Negative Negative   Protein,UA 2+ (A) Negative/Trace    Glucose, UA Negative Negative   Ketones, UA Negative Negative   RBC, UA 3+ (A) Negative   Bilirubin, UA Negative Negative   Urobilinogen, Ur 0.2 0.2 - 1.0 mg/dL   Nitrite, UA Negative Negative   Microscopic Examination See below:    Pertinent Imaging: Results for orders placed during the hospital encounter of 06/08/22  CT RENAL STONE STUDY  Narrative CLINICAL DATA:  Symptomatic urolithiasis.  Gross hematuria.  EXAM: CT ABDOMEN AND PELVIS WITHOUT CONTRAST  TECHNIQUE: Multidetector CT imaging of the abdomen and pelvis was performed following the standard protocol without IV contrast.  RADIATION DOSE REDUCTION: This exam was performed according to the departmental dose-optimization program which includes automated exposure control, adjustment of the mA and/or kV according to patient size and/or use of iterative reconstruction technique.  COMPARISON:  Most recent CT 01/24/2021  FINDINGS: Lower chest: Elevated right hemidiaphragm with compressive atelectasis in the lower and middle lobes. No pleural effusion. Dense coronary artery calcifications.  Hepatobiliary: Stable low-density lesions in the left lobe of the liver, previously characterized as cysts. Motion limits detailed assessment on the current exam. Mild gallbladder distention without calcified gallstone. Upper normal common bile duct at 7 mm.  Pancreas: Motion obscured.  No ductal dilatation or inflammation.  Spleen: Normal in size without focal abnormality.  Adrenals/Urinary Tract: No adrenal nodule. 14 x 15 mm stone in the right renal pelvis at the ureteropelvic junction. There is slight calyceal distention no frank hydronephrosis. Three dish in a small nonobstructing stones are seen in the mid lower right kidney. There are 4 punctate intrarenal calculi on the left. No left hydronephrosis. Faint symmetric bilateral perinephric edema, similar to prior. Right renal cyst appears simple, no follow-up imaging  is recommended. No evidence of ureteral stone, portions of the distal ureters are obscured by right hip arthroplasty. The urinary bladder is near completely empty and not well assessed. No urethral stone is seen  Stomach/Bowel: Small hiatal hernia. Prior bariatric surgery with gastric suture line and enteric sutures in the central small bowel. No bowel obstruction or inflammation. Normal appendix visualized. Moderate to large volume of colonic stool. The sigmoid colon is redundant and courses into the left mid abdomen. Mild distal left colonic diverticulosis without diverticulitis.  Vascular/Lymphatic: Moderate aortic atherosclerosis. No aneurysm. No bulky adenopathy,  allowing for motion.  Reproductive: Chronically dilated partially calcified right seminal vesicle. Unremarkable prostate.  Other: No ascites. Postsurgical changes the anterior abdominal wall, no abdominal wall hernia.  Musculoskeletal: Degenerative disc disease and facet hypertrophy in the lumbar spine. Right hip arthroplasty.  IMPRESSION: 1. A 14 x 15 mm stone in the right renal pelvis at the ureteropelvic junction with slight calyceal distention, likely intermittently obstructing. No frank hydronephrosis. 2. Additional nonobstructing bilateral intrarenal calculi. 3. Moderate to large volume of colonic stool, can be seen with constipation. Mild distal left colonic diverticulosis without diverticulitis. 4. Small hiatal hernia. 5. Additional stable chronic findings as described.   Electronically Signed By: Keith Rake M.D. On: 06/08/2022 20:53  I personally reviewed the images referenced above and note a 14x89m right UPJ stone with no evidence of high-grade obstruction.  Assessment & Plan:   1. Gross hematuria Anticipated to be secondary to a large, 14 x 15 mm right UPJ stone.  Fortunately he is asymptomatic aside from his gross hematuria and no evidence of high-grade obstruction on recent CT.  His UA  today is rather bland, low suspicion for infection at this time.  Given stone size and migration from the right renal pelvis, I recommended pursuing definitive stone management.  We discussed various treatment options for his stone including ESWL vs. ureteroscopy with laser lithotripsy and stent. We discussed the risks and benefits of all procedures including bleeding, infection, and damage to surrounding structures.  We specifically discussed that ESWL is a less invasive procedure that requires less anesthesia, however patients have to pass their residual stone fragments, which may take several weeks and be associated with renal colic. Additionally, we discussed the limitations of ESWL including the low, 10-20% chance of treatment failure requiring repeat ESWL versus ureteroscopy in the future. By comparison, ureteroscopy is a more invasive surgical procedure that requires more anesthesia, but it does require placement of a ureteral stent, which will remain in place for approximately 3-10 days and can be associated with flank pain, bladder pain, dysuria, urgency, frequency, urinary leakage, and gross hematuria.  We also specifically discussed the possibility of requiring a staged approach with either procedure type given his stone size and density. We discussed that a second URS could be completed 1-2 weeks following the first but given his stone's location, a second ESWL would need to be delayed 6 weeks at least to reduce his risk for perinephric hematoma.  Regardless of procedure, we discussed that he will require cardiac clearance prior from Dr. MLaurance Flatten  Based on this conversation, he would like to proceed with ESWL. Orders placed; preop Ucx ordered. Will obtain cardiac clearance and tentatively plan for 1/18, however we discussed that we will need to defer the procedure if we have not received cardiac clearance prior. He expressed understanding.  We discussed return precautions in the interim  including severe pain, fever, chills, nausea, and vomiting. - Urinalysis, Complete - CULTURE, URINE COMPREHENSIVE   Return in about 9 days (around 06/22/2022) for Right ESWL (tentative, pending cardiac clearance prior).  SDebroah Loop PA-C  [facility] [address] [place] [phone]

## 2022-06-17 LAB — CULTURE, URINE COMPREHENSIVE

## 2022-06-20 ENCOUNTER — Ambulatory Visit: Payer: Medicare Other | Admitting: Physician Assistant

## 2022-06-21 MED ORDER — DIPHENHYDRAMINE HCL 25 MG PO CAPS: 25.0000 mg | ORAL_CAPSULE | ORAL | Status: AC

## 2022-06-21 MED ORDER — SODIUM CHLORIDE 0.9 % IV SOLN: INTRAVENOUS | Status: DC

## 2022-06-21 MED ORDER — DIAZEPAM 5 MG PO TABS: 10.0000 mg | ORAL_TABLET | ORAL | Status: AC

## 2022-06-22 ENCOUNTER — Ambulatory Visit: Payer: Medicare Other

## 2022-06-22 ENCOUNTER — Encounter
Admission: RE | Disposition: A | Discharge status: Home / Self Care | Payer: Self-pay | Source: Home / Self Care | Attending: Urology

## 2022-06-22 ENCOUNTER — Ambulatory Visit
Admission: RE | Admit: 2022-06-22 | Discharge: 2022-06-22 | Disposition: A | Discharge status: Home / Self Care | Payer: Medicare Other | Attending: Urology | Admitting: Urology

## 2022-06-22 ENCOUNTER — Other Ambulatory Visit: Payer: Self-pay

## 2022-06-22 DIAGNOSIS — I252 Old myocardial infarction: Secondary | ICD-10-CM | POA: Insufficient documentation

## 2022-06-22 DIAGNOSIS — I509 Heart failure, unspecified: Secondary | ICD-10-CM | POA: Insufficient documentation

## 2022-06-22 DIAGNOSIS — Z955 Presence of coronary angioplasty implant and graft: Secondary | ICD-10-CM | POA: Insufficient documentation

## 2022-06-22 DIAGNOSIS — K449 Diaphragmatic hernia without obstruction or gangrene: Secondary | ICD-10-CM | POA: Diagnosis not present

## 2022-06-22 DIAGNOSIS — N201 Calculus of ureter: Secondary | ICD-10-CM | POA: Insufficient documentation

## 2022-06-22 DIAGNOSIS — I110 Hypertensive heart disease with heart failure: Secondary | ICD-10-CM | POA: Diagnosis not present

## 2022-06-22 DIAGNOSIS — Z6839 Body mass index (BMI) 39.0-39.9, adult: Secondary | ICD-10-CM | POA: Diagnosis not present

## 2022-06-22 DIAGNOSIS — Z79899 Other long term (current) drug therapy: Secondary | ICD-10-CM | POA: Insufficient documentation

## 2022-06-22 DIAGNOSIS — Z9884 Bariatric surgery status: Secondary | ICD-10-CM | POA: Diagnosis not present

## 2022-06-22 DIAGNOSIS — G4733 Obstructive sleep apnea (adult) (pediatric): Secondary | ICD-10-CM | POA: Diagnosis not present

## 2022-06-22 DIAGNOSIS — K5730 Diverticulosis of large intestine without perforation or abscess without bleeding: Secondary | ICD-10-CM | POA: Insufficient documentation

## 2022-06-22 DIAGNOSIS — J449 Chronic obstructive pulmonary disease, unspecified: Secondary | ICD-10-CM | POA: Insufficient documentation

## 2022-06-22 HISTORY — PX: EXTRACORPOREAL SHOCK WAVE LITHOTRIPSY: SHX1557

## 2022-06-22 SURGERY — LITHOTRIPSY, ESWL
Anesthesia: Moderate Sedation | Laterality: Right

## 2022-06-22 MED ORDER — DIPHENHYDRAMINE HCL 25 MG PO CAPS
ORAL_CAPSULE | ORAL | Status: AC
  Administered 2022-06-22: 25 mg via ORAL
  Filled 2022-06-22: qty 1

## 2022-06-22 MED ORDER — CEPHALEXIN 500 MG PO CAPS: 500.0000 mg | ORAL_CAPSULE | Freq: Once | ORAL | Status: AC

## 2022-06-22 MED ORDER — CEPHALEXIN 500 MG PO CAPS
ORAL_CAPSULE | ORAL | Status: AC
  Administered 2022-06-22: 500 mg via ORAL
  Filled 2022-06-22: qty 1

## 2022-06-22 MED ORDER — ONDANSETRON HCL 4 MG/2ML IJ SOLN
INTRAMUSCULAR | Status: AC
  Administered 2022-06-22: 4 mg via INTRAVENOUS
  Filled 2022-06-22: qty 2

## 2022-06-22 MED ORDER — DIAZEPAM 5 MG PO TABS
ORAL_TABLET | ORAL | Status: AC
  Administered 2022-06-22: 10 mg via ORAL
  Filled 2022-06-22: qty 2

## 2022-06-22 MED ORDER — ONDANSETRON HCL 4 MG/2ML IJ SOLN: 4.0000 mg | Freq: Once | INTRAMUSCULAR | Status: AC

## 2022-06-22 NOTE — Progress Notes (Signed)
Mr. Tungate brought down to mobile lithotripsy for treatment of his right UPJ stone.  When placed on monitor intermittent bradycardia to the low 40s/39 with intermittent PVCs.  This was presedation.  Based on [facility] guidelines we will be unable to treat and will have him get cardiology follow-up.

## 2022-06-22 NOTE — Interval H&P Note (Signed)
History and Physical Interval Note:  CV:RRR Lungs:clear  06/22/2022 10:26 AM  [name]  has presented today for surgery, with the diagnosis of Right Ureteral Stone.  The various methods of treatment have been discussed with the patient and family. After consideration of risks, benefits and other options for treatment, the patient has consented to  Procedure(s): EXTRACORPOREAL SHOCK WAVE LITHOTRIPSY (ESWL) (Right) as a surgical intervention.  The patient's history has been reviewed, patient examined, no change in status, stable for surgery.  I have reviewed the patient's chart and labs.  Questions were answered to the patient's satisfaction.     William Armstrong

## 2022-06-23 ENCOUNTER — Encounter: Payer: Self-pay | Admitting: Urology

## 2022-06-30 ENCOUNTER — Observation Stay
Admission: EM | Admit: 2022-06-30 | Discharge: 2022-07-01 | Disposition: A | Discharge status: Home / Self Care | Payer: Medicare Other | Attending: Internal Medicine | Admitting: Internal Medicine

## 2022-06-30 ENCOUNTER — Emergency Department: Payer: Medicare Other

## 2022-06-30 ENCOUNTER — Other Ambulatory Visit: Payer: Self-pay

## 2022-06-30 DIAGNOSIS — Z79899 Other long term (current) drug therapy: Secondary | ICD-10-CM | POA: Diagnosis not present

## 2022-06-30 DIAGNOSIS — N400 Enlarged prostate without lower urinary tract symptoms: Secondary | ICD-10-CM

## 2022-06-30 DIAGNOSIS — Z85828 Personal history of other malignant neoplasm of skin: Secondary | ICD-10-CM | POA: Diagnosis not present

## 2022-06-30 DIAGNOSIS — R0789 Other chest pain: Secondary | ICD-10-CM | POA: Diagnosis present

## 2022-06-30 DIAGNOSIS — R7989 Other specified abnormal findings of blood chemistry: Secondary | ICD-10-CM | POA: Diagnosis present

## 2022-06-30 DIAGNOSIS — G4733 Obstructive sleep apnea (adult) (pediatric): Secondary | ICD-10-CM | POA: Diagnosis not present

## 2022-06-30 DIAGNOSIS — R9431 Abnormal electrocardiogram [ECG] [EKG]: Secondary | ICD-10-CM | POA: Diagnosis not present

## 2022-06-30 DIAGNOSIS — F32A Depression, unspecified: Secondary | ICD-10-CM | POA: Diagnosis present

## 2022-06-30 DIAGNOSIS — Z7982 Long term (current) use of aspirin: Secondary | ICD-10-CM | POA: Diagnosis not present

## 2022-06-30 DIAGNOSIS — I214 Non-ST elevation (NSTEMI) myocardial infarction: Secondary | ICD-10-CM | POA: Diagnosis not present

## 2022-06-30 DIAGNOSIS — I10 Essential (primary) hypertension: Secondary | ICD-10-CM | POA: Diagnosis present

## 2022-06-30 DIAGNOSIS — I110 Hypertensive heart disease with heart failure: Secondary | ICD-10-CM | POA: Insufficient documentation

## 2022-06-30 DIAGNOSIS — R945 Abnormal results of liver function studies: Secondary | ICD-10-CM | POA: Diagnosis not present

## 2022-06-30 DIAGNOSIS — I5022 Chronic systolic (congestive) heart failure: Secondary | ICD-10-CM | POA: Diagnosis not present

## 2022-06-30 DIAGNOSIS — Z96641 Presence of right artificial hip joint: Secondary | ICD-10-CM | POA: Diagnosis not present

## 2022-06-30 DIAGNOSIS — J449 Chronic obstructive pulmonary disease, unspecified: Secondary | ICD-10-CM | POA: Diagnosis present

## 2022-06-30 DIAGNOSIS — R079 Chest pain, unspecified: Secondary | ICD-10-CM

## 2022-06-30 DIAGNOSIS — E785 Hyperlipidemia, unspecified: Secondary | ICD-10-CM | POA: Diagnosis present

## 2022-06-30 DIAGNOSIS — I2510 Atherosclerotic heart disease of native coronary artery without angina pectoris: Secondary | ICD-10-CM | POA: Diagnosis not present

## 2022-06-30 DIAGNOSIS — E876 Hypokalemia: Secondary | ICD-10-CM | POA: Diagnosis present

## 2022-06-30 LAB — COMPREHENSIVE METABOLIC PANEL
ALT: 57 U/L — ABNORMAL HIGH (ref 0–44)
AST: 71 U/L — ABNORMAL HIGH (ref 15–41)
Albumin: 3.1 g/dL — ABNORMAL LOW (ref 3.5–5.0)
Alkaline Phosphatase: 85 U/L (ref 38–126)
Anion gap: 11 (ref 5–15)
BUN: 19 mg/dL (ref 8–23)
CO2: 20 mmol/L — ABNORMAL LOW (ref 22–32)
Calcium: 7.9 mg/dL — ABNORMAL LOW (ref 8.9–10.3)
Chloride: 106 mmol/L (ref 98–111)
Creatinine, Ser: 1.05 mg/dL (ref 0.61–1.24)
GFR, Estimated: >60 mL/min (ref >60)
Glucose, Bld: 109 mg/dL — ABNORMAL HIGH (ref 70–99)
Potassium: 3.3 mmol/L — ABNORMAL LOW (ref 3.5–5.1)
Sodium: 137 mmol/L (ref 135–145)
Total Bilirubin: 1.1 mg/dL (ref 0.3–1.2)
Total Protein: 5.5 g/dL — ABNORMAL LOW (ref 6.5–8.1)

## 2022-06-30 LAB — CBC WITH DIFFERENTIAL/PLATELET
Abs Immature Granulocytes: 0.02 10*3/uL (ref 0.00–0.07)
Basophils Absolute: 0 10*3/uL (ref 0.0–0.1)
Basophils Relative: 0 %
Eosinophils Absolute: 0 10*3/uL (ref 0.0–0.5)
Eosinophils Relative: 0 %
HCT: 37.8 % — ABNORMAL LOW (ref 39.0–52.0)
Hemoglobin: 12.2 g/dL — ABNORMAL LOW (ref 13.0–17.0)
Immature Granulocytes: 0 %
Lymphocytes Relative: 11 %
Lymphs Abs: 0.8 10*3/uL (ref 0.7–4.0)
MCH: 28 pg (ref 26.0–34.0)
MCHC: 32.3 g/dL (ref 30.0–36.0)
MCV: 86.7 fL (ref 80.0–100.0)
Monocytes Absolute: 0.7 10*3/uL (ref 0.1–1.0)
Monocytes Relative: 10 %
Neutro Abs: 5.6 10*3/uL (ref 1.7–7.7)
Neutrophils Relative %: 79 %
Platelets: 141 10*3/uL — ABNORMAL LOW (ref 150–400)
RBC: 4.36 MIL/uL (ref 4.22–5.81)
RDW: 15.2 % (ref 11.5–15.5)
WBC: 7.2 10*3/uL (ref 4.0–10.5)
nRBC: 0 % (ref 0.0–0.2)

## 2022-06-30 LAB — HEPARIN LEVEL (UNFRACTIONATED): Heparin Unfractionated: 0.12 IU/mL — ABNORMAL LOW (ref 0.30–0.70)

## 2022-06-30 LAB — TROPONIN I (HIGH SENSITIVITY)
Troponin I (High Sensitivity): 68 ng/L — ABNORMAL HIGH (ref <18)
Troponin I (High Sensitivity): 83 ng/L — ABNORMAL HIGH (ref <18)
Troponin I (High Sensitivity): 119 ng/L (ref <18)
Troponin I (High Sensitivity): 127 ng/L (ref <18)
Troponin I (High Sensitivity): 171 ng/L (ref <18)

## 2022-06-30 LAB — PROTIME-INR
INR: 1.1 (ref 0.8–1.2)
Prothrombin Time: 14.6 seconds (ref 11.4–15.2)

## 2022-06-30 LAB — BRAIN NATRIURETIC PEPTIDE: B Natriuretic Peptide: 195.2 pg/mL — ABNORMAL HIGH (ref 0.0–100.0)

## 2022-06-30 LAB — APTT: aPTT: 28 seconds (ref 24–36)

## 2022-06-30 LAB — MAGNESIUM: Magnesium: 2 mg/dL (ref 1.7–2.4)

## 2022-06-30 MED ORDER — HYDRALAZINE HCL 20 MG/ML IJ SOLN: 5.0000 mg | INTRAMUSCULAR | Status: DC | PRN

## 2022-06-30 MED ORDER — SERTRALINE HCL 50 MG PO TABS
100.0000 mg | ORAL_TABLET | Freq: Every day | ORAL | Status: DC
  Administered 2022-07-01: 100 mg via ORAL
  Filled 2022-06-30: qty 2

## 2022-06-30 MED ORDER — MORPHINE SULFATE (PF) 2 MG/ML IV SOLN: 2.0000 mg | INTRAVENOUS | Status: DC | PRN

## 2022-06-30 MED ORDER — ROSUVASTATIN CALCIUM 20 MG PO TABS
40.0000 mg | ORAL_TABLET | Freq: Every day | ORAL | Status: DC
  Administered 2022-07-01: 40 mg via ORAL
  Filled 2022-06-30: qty 2

## 2022-06-30 MED ORDER — FERROUS SULFATE 325 (65 FE) MG PO TABS
325.0000 mg | ORAL_TABLET | Freq: Every day | ORAL | Status: DC
  Administered 2022-07-01: 325 mg via ORAL
  Filled 2022-06-30: qty 1

## 2022-06-30 MED ORDER — HEPARIN BOLUS VIA INFUSION
3000.0000 [IU] | Freq: Once | INTRAVENOUS | Status: AC
  Administered 2022-06-30: 3000 [IU] via INTRAVENOUS
  Filled 2022-06-30: qty 3000

## 2022-06-30 MED ORDER — ASPIRIN 81 MG PO TBEC
81.0000 mg | DELAYED_RELEASE_TABLET | Freq: Every day | ORAL | Status: DC
  Administered 2022-07-01: 81 mg via ORAL
  Filled 2022-06-30: qty 1

## 2022-06-30 MED ORDER — HEPARIN BOLUS VIA INFUSION
4000.0000 [IU] | Freq: Once | INTRAVENOUS | Status: AC
  Administered 2022-06-30: 4000 [IU] via INTRAVENOUS
  Filled 2022-06-30: qty 4000

## 2022-06-30 MED ORDER — HEPARIN (PORCINE) 25000 UT/250ML-% IV SOLN
1800.0000 [IU]/h | INTRAVENOUS | Status: DC
  Administered 2022-06-30: 1200 [IU]/h via INTRAVENOUS
  Filled 2022-06-30 (×2): qty 250

## 2022-06-30 MED ORDER — LATANOPROST 0.005 % OP SOLN
1.0000 [drp] | Freq: Every day | OPHTHALMIC | Status: DC
  Administered 2022-06-30: 1 [drp] via OPHTHALMIC
  Filled 2022-06-30: qty 2.5

## 2022-06-30 MED ORDER — CLOPIDOGREL BISULFATE 75 MG PO TABS
75.0000 mg | ORAL_TABLET | Freq: Every day | ORAL | Status: DC
  Administered 2022-07-01: 75 mg via ORAL
  Filled 2022-06-30: qty 1

## 2022-06-30 MED ORDER — POTASSIUM CHLORIDE CRYS ER 20 MEQ PO TBCR
40.0000 meq | EXTENDED_RELEASE_TABLET | Freq: Once | ORAL | Status: AC
  Administered 2022-06-30: 40 meq via ORAL
  Filled 2022-06-30: qty 2

## 2022-06-30 MED ORDER — NITROGLYCERIN 0.4 MG SL SUBL: 0.4000 mg | SUBLINGUAL_TABLET | SUBLINGUAL | Status: DC | PRN

## 2022-06-30 MED ORDER — LISINOPRIL 10 MG PO TABS: 40.0000 mg | ORAL_TABLET | Freq: Every day | ORAL | Status: DC

## 2022-06-30 MED ORDER — IBUPROFEN 400 MG PO TABS: 200.0000 mg | ORAL_TABLET | Freq: Four times a day (QID) | ORAL | Status: DC | PRN

## 2022-06-30 MED ORDER — SODIUM CHLORIDE 0.9 % IV SOLN: 12.5000 mg | Freq: Three times a day (TID) | INTRAVENOUS | Status: DC | PRN

## 2022-06-30 MED ORDER — ONDANSETRON HCL 4 MG/2ML IJ SOLN: 4.0000 mg | Freq: Three times a day (TID) | INTRAMUSCULAR | Status: DC | PRN

## 2022-06-30 MED ORDER — DM-GUAIFENESIN ER 30-600 MG PO TB12: 1.0000 | ORAL_TABLET | Freq: Two times a day (BID) | ORAL | Status: DC | PRN

## 2022-06-30 MED ORDER — ALBUTEROL SULFATE (2.5 MG/3ML) 0.083% IN NEBU: 2.5000 mg | INHALATION_SOLUTION | RESPIRATORY_TRACT | Status: DC | PRN

## 2022-06-30 NOTE — Consult Note (Signed)
ANTICOAGULATION CONSULT NOTE - Initial Consult  Pharmacy Consult for Heparin infusion Indication: chest pain/ACS  Allergies  Allergen Reactions   Atorvastatin Other (See Comments)    Muscle aches. Other reaction(s): Other (See Comments) Muscle aches. Muscle pain   Pregabalin Other (See Comments)    Muscle soreness Other reaction(s): Other (See Comments) Muscle soreness Severe muscle pain    Patient Measurements: Wt 115.7 kg Ht 5'7" IBW 66.1 kg Heparin Dosing Weight: 92.5 kg  Vital Signs: Temp: 98.5 F (36.9 C) (01/26 0737) Temp Source: Oral (01/26 0737) BP: 118/58 (01/26 0930) Pulse Rate: 62 (01/26 0930)  Labs: Recent Labs    06/30/22 0750 06/30/22 0855 06/30/22 0947  HGB 12.2*  --   --   HCT 37.8*  --   --   PLT 141*  --   --   CREATININE  --  1.05  --   TROPONINIHS  --  68* 83*    Estimated Creatinine Clearance: 76.1 mL/min (by C-G formula based on SCr of 1.05 mg/dL).   Medical History: Past Medical History:  Diagnosis Date   Anemia    Arthritis    BCC (basal cell carcinoma of skin)    CHF (congestive heart failure) (HCC)    Complication of anesthesia    neck hurt for 2-3 days after general anesthesia   COPD (chronic obstructive pulmonary disease) (HCC)    Coronary artery disease    Depression    Glaucoma    Heart attack ([place])    2013   History of kidney stones    HTN (hypertension)    Hyperglycemia    pt denies   Hypertension    Hypokalemia    Obesity    morbid   Sleep apnea    osa, CPAP has been dc'd after 249 lb weight loss    Medications:  NO AC prior to admission.  Assessment: Patient with hx of HF, HTN, MI,a d sleep apnea. Presents to ED with checsp pain. Pharmacy consulted to manage heparin infusion for ACS. No AC prior to admission. Compliance with plavix '75mg'$  - last fill 1/202024).  Hgb and plt appropriate to start heparin infusion.  Goal of Therapy:  Heparin level 0.3-0.7 units/ml Monitor platelets by anticoagulation  protocol: Yes   Plan:  Give 4000 units bolus x 1 Start heparin infusion at 1200 units/hr Check anti-Xa level in 6 hours and daily while on heparin Continue to monitor H&H and platelets  Chanin Frumkin Rodriguez-Guzman PharmD, BCPS 06/30/2022 11:13 AM

## 2022-06-30 NOTE — ED Provider Notes (Signed)
[facility] Provider Note    Event Date/Time   First MD Initiated Contact with Patient 06/30/22 [phone]     (approximate)  History   Chief Complaint: Chest Pain  HPI  William Armstrong is a 72 y.o. male with a past medical history of anemia, arthritis, CHF, CAD x 3 stents, hypertension, presents to the emergency department for chest pain.  According to the patient he has a history of cardiac disease and has had 3 stents previously last of which was in June per patient.  He states last night he developed some mild chest pain, states it went away but then returned very early this morning around 5 AM.  Patient took nitroglycerin x 2 at home and states good relief of chest pain however still had very slight discomfort so he came to the emergency department.  Patient did receive aspirin prior to arrival as well.  Currently patient states the pain is gone but he feels a very slight tightness in the center of his chest.  States some shortness of breath last night but denies any currently.  Patient states due to his heart history he gets "worried" anytime he has chest discomfort.  Currently patient appears well, no distress, resting comfortably in bed.  Physical Exam   Triage Vital Signs: ED Triage Vitals  Enc Vitals Group     BP 06/30/22 0737 (!) 142/69     Pulse Rate 06/30/22 0737 79     Resp 06/30/22 0737 (!) 23     Temp 06/30/22 0737 98.5 F (36.9 C)     Temp Source 06/30/22 0737 Oral     SpO2 06/30/22 0737 95 %     Weight --      Height --      Head Circumference --      Peak Flow --      Pain Score 06/30/22 0729 4     Pain Loc --      Pain Edu? --      Excl. in [place]? --     Most recent vital signs: Vitals:   06/30/22 0737  BP: (!) 142/69  Pulse: 79  Resp: (!) 23  Temp: 98.5 F (36.9 C)  SpO2: 95%    General: Awake, no distress.  CV:  Good peripheral perfusion.  Regular rate and rhythm  Resp:  Normal effort.  Equal breath sounds bilaterally.  Abd:  No  distention.  Soft, nontender.  No rebound or guarding. Other:  Mild lower extremity edema bilaterally.   ED Results / Procedures / Treatments   EKG  EKG viewed and interpreted by myself shows a normal sinus rhythm at 73 bpm with a widened QRS, left axis deviation slight QTc prolongation otherwise normal intervals with nonspecific ST changes right bundle branch block.  EKG largely unchanged from prior EKG 11/09/2019  RADIOLOGY  I have reviewed and interpreted chest x-ray images.  Low lung volume but I do not see any obvious consolidation. Radiology shows no obvious consolidation.   MEDICATIONS ORDERED IN ED: Medications - No data to display   IMPRESSION / MDM / [place] / ED COURSE  I reviewed the triage vital signs and the nursing notes.  Patient's presentation is most consistent with acute presentation with potential threat to life or bodily function.  Patient presents emergency department for chest pain onset last night around 11:00 has been intermittent, returned this morning however largely gone now after taking 2 nitroglycerin and aspirin at home.  Overall the patient appears well, no distress.  States very minimal chest tightness currently.  EKG is unchanged from prior EKG.  We will check labs including cardiac enzymes x 2 obtain a chest x-ray and continue to closely monitor.  Patient agreeable to plan of care.  Patient's labs have resulted showing an overall reassuring chemistry with mild LFT elevation.  No right upper quadrant tenderness on repeat exam.  Right upper quadrant ultrasound ordered by myself shows no significant gallbladder/liver finding besides a known cyst.  Patient's troponin is elevated at 68, repeat troponin is now elevated to 83.  Continues to have very slight central chest discomfort.  Given the patient's chest pain with significant cardiac history and elevating troponins I have started heparin.  Will admit to the hospital service for further workup and  treatment.  The remainder of the patient's labs including CBC is nonrevealing.  Chest x-ray shows no concerning finding and EKG appears to be at baseline.  CRITICAL CARE Performed by: Harvest Dark   Total critical care time: 30 minutes  Critical care time was exclusive of separately billable procedures and treating other patients.  Critical care was necessary to treat or prevent imminent or life-threatening deterioration.  Critical care was time spent personally by me on the following activities: development of treatment plan with patient and/or surrogate as well as nursing, discussions with consultants, evaluation of patient's response to treatment, examination of patient, obtaining history from patient or surrogate, ordering and performing treatments and interventions, ordering and review of laboratory studies, ordering and review of radiographic studies, pulse oximetry and re-evaluation of patient's condition.   FINAL CLINICAL IMPRESSION(S) / ED DIAGNOSES   Chest pain NSTEMI   Note:  This document was prepared using Dragon voice recognition software and may include unintentional dictation errors.   Harvest Dark, MD 06/30/22 1243

## 2022-06-30 NOTE — Consult Note (Signed)
ANTICOAGULATION CONSULT NOTE - Initial Consult  Pharmacy Consult for Heparin infusion Indication: chest pain/ACS  Allergies  Allergen Reactions   Atorvastatin Other (See Comments)    Muscle aches. Other reaction(s): Other (See Comments) Muscle aches. Muscle pain   Pregabalin Other (See Comments)    Muscle soreness Other reaction(s): Other (See Comments) Muscle soreness Severe muscle pain    Patient Measurements: Wt 115.7 kg Ht 5'7" IBW 66.1 kg Heparin Dosing Weight: 92.5 kg  Vital Signs: Temp: 98.4 F (36.9 C) (01/26 1430) BP: 150/71 (01/26 2030) Pulse Rate: 65 (01/26 2030)  Labs: Recent Labs    06/30/22 0750 06/30/22 0855 06/30/22 0947 06/30/22 1109 06/30/22 1322 06/30/22 1600 06/30/22 1946  HGB 12.2*  --   --   --   --   --   --   HCT 37.8*  --   --   --   --   --   --   PLT 141*  --   --   --   --   --   --   APTT  --   --   --  28  --   --   --   LABPROT  --   --   --  14.6  --   --   --   INR  --   --   --  1.1  --   --   --   HEPARINUNFRC  --   --   --   --   --   --  0.12*  CREATININE  --  1.05  --   --   --   --   --   TROPONINIHS  --  68*   < >  --  119* 127* 171*   < > = values in this interval not displayed.     Estimated Creatinine Clearance: 76.1 mL/min (by C-G formula based on SCr of 1.05 mg/dL).   Medical History: Past Medical History:  Diagnosis Date   Anemia    Arthritis    BCC (basal cell carcinoma of skin)    CHF (congestive heart failure) (HCC)    Complication of anesthesia    neck hurt for 2-3 days after general anesthesia   COPD (chronic obstructive pulmonary disease) (HCC)    Coronary artery disease    Depression    Glaucoma    Heart attack ([place])    2013   History of kidney stones    HTN (hypertension)    Hyperglycemia    pt denies   Hypertension    Hypokalemia    Obesity    morbid   Sleep apnea    osa, CPAP has been dc'd after 249 lb weight loss    Medications:  NO AC prior to  admission.  Assessment: Patient with hx of HF, HTN, MI,a d sleep apnea. Presents to ED with checsp pain. Pharmacy consulted to manage heparin infusion for ACS. No AC prior to admission. Compliance with plavix '75mg'$  - last fill 1/202024).   Goal of Therapy:  Heparin level 0.3-0.7 units/ml Monitor platelets by anticoagulation protocol: Yes   Date Time HL Rate/Comment 1/26 1946 0.12 Subtherapeutic/ 1200 > 1550 u/hr   Plan:  Give 3000 units bolus x1; then increase heparin infusion to1550 units/hr Check anti-Xa level in 8 hours and daily once consecutively therapeutic. Continue to monitor H&H and platelets daily while on heparin gtt.  [place] Pharmacist 06/30/2022 9:02 PM

## 2022-06-30 NOTE — ED Triage Notes (Signed)
Pt to ED via EMS from home--CC of chest pain at 0300 this morning. Pt denies N/V/ SOB. Pt took Nitroglycerin .4 mg x2 @ 0600, 0615 and was given 324 mg Aspirin via EMS. Pt has a hx of multiple MI with stent placement and bundle blockage for several years. 18 G in L AC.

## 2022-06-30 NOTE — H&P (Signed)
History and Physical    William Armstrong RKY:[id number] DOB: Jan 06, 1951 DOA: 06/30/2022  Referring MD/NP/PA:   PCP: Baxter Hire, MD   Patient coming from:  The patient is coming from home.  At baseline, pt is independent for most of ADL.        Chief Complaint: Chest pain  HPI: William Armstrong is a 72 y.o. male with medical history significant of CAD, DES stent placement, diastolic CHF, hypertension, hyperlipidemia, COPD, depression, kidney stone, obesity who presents with chest pain since this morning.   Patient states that his chest pain started in the early morning, which is located in the substernal area, mild to moderate, pressure and dull, nonradiating.  Not associated with cough or shortness breath.  No fever or chills.  Patient does not have nausea vomiting, diarrhea or abdominal pain.  No symptoms of UTI.  Denies any rectal bleeding or dark stool.  No recent fall or head injury.  Data reviewed independently and ED Course: pt was found to have Troponin 68--> 83, WBC 7.2, GFR> 60, abnormal liver function (ALP 85, AST 71, ALT 57, total bilirubin 1.1), temperature normal, blood pressure 143/70, heart rate 79, RR 23, oxygen saturation 97% on room air.  Chest x-ray showed vascular congestion.  Patient is admitted to telemetry bed as inpatient.  Dr. Clayborn Bigness of cardiology is consulted.  US-RUQ: Limited study due to overlapping bowel gas and soft tissue.   No gallstones or ductal dilatation.   Benign left hepatic lobe cyst as seen on prior exams.   Trace ascites.   Incidental note of collecting system dilatation of the right kidney of uncertain etiology. This was not seen on the prior CT scan of 06/08/2022. Please correlate for any known history or dedicated CT workup when appropriate   EKG: I have personally reviewed.  Sinus rhythm, QTc 515, ST elevation only in lead III, left bundle blockade, RAD.   Review of Systems:   General: no fevers, chills, no body weight gain,  fatigue HEENT: no blurry vision, hearing changes or sore throat Respiratory: no dyspnea, coughing, wheezing CV: has chest pain, no palpitations GI: no nausea, vomiting, abdominal pain, diarrhea, constipation GU: no dysuria, burning on urination, increased urinary frequency, hematuria  Ext: no leg edema Neuro: no unilateral weakness, numbness, or tingling, no vision change or hearing loss Skin: no rash, no skin tear. MSK: No muscle spasm, no deformity, no limitation of range of movement in spin Heme: No easy bruising.  Travel history: No recent long distant travel.   Allergy:  Allergies  Allergen Reactions   Atorvastatin Other (See Comments)    Muscle aches. Other reaction(s): Other (See Comments) Muscle aches. Muscle pain   Pregabalin Other (See Comments)    Muscle soreness Other reaction(s): Other (See Comments) Muscle soreness Severe muscle pain    Past Medical History:  Diagnosis Date   Anemia    Arthritis    BCC (basal cell carcinoma of skin)    CHF (congestive heart failure) (HCC)    Complication of anesthesia    neck hurt for 2-3 days after general anesthesia   COPD (chronic obstructive pulmonary disease) ([place])    Coronary artery disease    Depression    Glaucoma    Heart attack ([place])    2013   History of kidney stones    HTN (hypertension)    Hyperglycemia    pt denies   Hypertension    Hypokalemia    Obesity    morbid  Sleep apnea    osa, CPAP has been dc'd after 249 lb weight loss    Past Surgical History:  Procedure Laterality Date   CORONARY STENT INTERVENTION N/A 11/10/2019   Procedure: CORONARY STENT INTERVENTION;  Surgeon: Isaias Cowman, MD;  Location: [facility];  Service: Cardiovascular;  Laterality: N/A;   CORONARY STENT PLACEMENT  02/2012   CYSTOSCOPY W/ RETROGRADES Left 11/18/2016   Procedure: CYSTOSCOPY WITH RETROGRADE PYELOGRAM;  Surgeon: Irine Seal, MD;  Location: [facility];  Service: Urology;  Laterality: Left;    CYSTOSCOPY WITH STENT PLACEMENT Left 11/18/2016   Procedure: CYSTOSCOPY WITH STENT PLACEMENT;  Surgeon: Irine Seal, MD;  Location: [facility];  Service: Urology;  Laterality: Left;   CYSTOSCOPY/URETEROSCOPY/HOLMIUM LASER/STENT PLACEMENT Left 11/29/2016   Procedure: CYSTOSCOPY/URETEROSCOPY/HOLMIUM LASER/STENT EXCHANGE;  Surgeon: Hollice Espy, MD;  Location: [facility];  Service: Urology;  Laterality: Left;   CYSTOSCOPY/URETEROSCOPY/HOLMIUM LASER/STENT PLACEMENT Right 03/07/2017   Procedure: CYSTOSCOPY/URETEROSCOPY/HOLMIUM LASER/STENT PLACEMENT;  Surgeon: Hollice Espy, MD;  Location: [facility];  Service: Urology;  Laterality: Right;   EXTRACORPOREAL SHOCK WAVE LITHOTRIPSY Left 02/03/2021   Procedure: EXTRACORPOREAL SHOCK WAVE LITHOTRIPSY (ESWL);  Surgeon: Hollice Espy, MD;  Location: [facility];  Service: Urology;  Laterality: Left;   EXTRACORPOREAL SHOCK WAVE LITHOTRIPSY Right 06/22/2022   Procedure: EXTRACORPOREAL SHOCK WAVE LITHOTRIPSY (ESWL);  Surgeon: Abbie Sons, MD;  Location: [facility];  Service: Urology;  Laterality: Right;   JOINT REPLACEMENT     right hip   LAPAROSCOPIC GASTRIC RESTRICTIVE DUODENAL PROCEDURE (DUODENAL SWITCH)     LAPAROSCOPIC GASTRIC SLEEVE RESECTION  08/21/2013   LEFT HEART CATH AND CORONARY ANGIOGRAPHY N/A 11/10/2019   Procedure: LEFT HEART CATH AND CORONARY ANGIOGRAPHY;  Surgeon: Teodoro Spray, MD;  Location: [facility];  Service: Cardiovascular;  Laterality: N/A;   skin removal surgery     removal of extra skin approx 20 lbs   STONE EXTRACTION WITH BASKET Right 03/07/2017   Procedure: STONE EXTRACTION WITH BASKET;  Surgeon: Hollice Espy, MD;  Location: [facility];  Service: Urology;  Laterality: Right;   TONSILLECTOMY AND ADENOIDECTOMY  1960   TOTAL HIP ARTHROPLASTY Right 01/18/2017   Procedure: TOTAL HIP ARTHROPLASTY ANTERIOR APPROACH;  Surgeon: Hessie Knows, MD;  Location: [facility];  Service: Orthopedics;  Laterality: Right;    Social History:   reports that he has never smoked. He has never been exposed to tobacco smoke. He has never used smokeless tobacco. He reports that he does not drink alcohol and does not use drugs.  Family History:  Family History  Problem Relation Age of Onset   Heart attack Mother    Brain cancer Father    Heart attack Sister    Congenital heart disease Sister    Leukemia Paternal Grandmother    COPD Brother    Heart disease Brother    Prostate cancer Neg Hx    Kidney cancer Neg Hx    Bladder Cancer Neg Hx      Prior to Admission medications   Medication Sig Start Date End Date Taking? Authorizing Provider  aspirin EC 81 MG tablet Take 81 mg by mouth daily.    [provider]  Cholecalciferol (VITAMIN D) 2000 units CAPS Take 2,000 Units by mouth daily.    [provider]  latanoprost (XALATAN) 0.005 % ophthalmic solution Place 1 drop into both eyes at bedtime. 08/05/19   [provider]  lisinopril (PRINIVIL,ZESTRIL) 40 MG tablet Take 40 mg by mouth daily.  08/19/16   [provider]  rosuvastatin (CRESTOR) 10 MG tablet Take 1 tablet by mouth daily. 01/11/21   [provider]  sertraline (ZOLOFT) 100 MG tablet Take 1 tablet (100 mg total) by mouth daily. 06/13/19   Trinna Post, PA-C  spironolactone (ALDACTONE) 25 MG tablet Take 25 mg by mouth daily. 10/16/19   [provider]  tamsulosin (FLOMAX) 0.4 MG CAPS capsule TAKE 1 CAPSULE BY MOUTH ONCE DAILY [facility] STONES 05/30/22   Debroah Loop, PA-C    Physical Exam: Vitals:   06/30/22 1100 06/30/22 1200 06/30/22 1215 06/30/22 1430  BP: (!) 140/68 (!) 143/70  (!) 146/80  Pulse: 76 (!) 55 60 68  Resp: 19 (!) 21 (!) 21 (!) 25  Temp:    98.4 F (36.9 C)  TempSrc:      SpO2: 94% 97% 97% 97%   General: Not in acute distress HEENT:       Eyes: PERRL, EOMI, no scleral icterus.       ENT: No discharge from the ears and nose, no pharynx injection, no tonsillar enlargement.        Neck:  No JVD, no bruit, no mass felt. Heme: No neck lymph node enlargement. Cardiac: S1/S2, RRR, No murmurs, No gallops or rubs. Respiratory: No rales, wheezing, rhonchi or rubs. GI: Soft, nondistended, nontender, no rebound pain, no organomegaly, BS present. GU: No hematuria Ext: No pitting leg edema bilaterally. 1+DP/PT pulse bilaterally. Musculoskeletal: No joint deformities, No joint redness or warmth, no limitation of ROM in spin. Skin: No rashes.  Neuro: Alert, oriented X3, cranial nerves II-XII grossly intact, moves all extremities normally. Psych: Patient is not psychotic, no suicidal or hemocidal ideation.  Labs on Admission: I have personally reviewed following labs and imaging studies  CBC: Recent Labs  Lab 06/30/22 0750  WBC 7.2  NEUTROABS 5.6  HGB 12.2*  HCT 37.8*  MCV 86.7  PLT 098*   Basic Metabolic Panel: Recent Labs  Lab 06/30/22 0855 06/30/22 0947  NA 137  --   K 3.3*  --   CL 106  --   CO2 20*  --   GLUCOSE 109*  --   BUN 19  --   CREATININE 1.05  --   CALCIUM 7.9*  --   MG  --  2.0   GFR: Estimated Creatinine Clearance: 76.1 mL/min (by C-G formula based on SCr of 1.05 mg/dL). Liver Function Tests: Recent Labs  Lab 06/30/22 0855  AST 71*  ALT 57*  ALKPHOS 85  BILITOT 1.1  PROT 5.5*  ALBUMIN 3.1*   No results for input(s): "LIPASE", "AMYLASE" in the last 168 hours. No results for input(s): "AMMONIA" in the last 168 hours. Coagulation Profile: Recent Labs  Lab 06/30/22 1109  INR 1.1   Cardiac Enzymes: No results for input(s): "CKTOTAL", "CKMB", "CKMBINDEX", "TROPONINI" in the last 168 hours. BNP (last 3 results) No results for input(s): "PROBNP" in the last 8760 hours. HbA1C: No results for input(s): "HGBA1C" in the last 72 hours. CBG: No results for input(s): "GLUCAP" in the last 168 hours. Lipid Profile: No results for input(s): "CHOL", "HDL", "LDLCALC", "TRIG", "CHOLHDL", "LDLDIRECT" in the last 72 hours. Thyroid Function Tests: No  results for input(s): "TSH", "T4TOTAL", "FREET4", "T3FREE", "THYROIDAB" in the last 72 hours. Anemia Panel: No results for input(s): "VITAMINB12", "FOLATE", "FERRITIN", "TIBC", "IRON", "RETICCTPCT" in the last 72 hours. Urine analysis:    Component Value Date/Time   COLORURINE AMBER (A) 11/03/2017 0933   APPEARANCEUR Cloudy (A) 06/13/2022 1047   LABSPEC  1.018 11/03/2017 0933   LABSPEC 1.034 06/04/2012 1008   PHURINE 5.0 11/03/2017 0933   GLUCOSEU Negative 06/13/2022 1047   GLUCOSEU Negative 06/04/2012 1008   HGBUR LARGE (A) 11/03/2017 0933   BILIRUBINUR Negative 06/13/2022 1047   BILIRUBINUR Negative 06/04/2012 1008   KETONESUR NEGATIVE 11/03/2017 0933   PROTEINUR 2+ (A) 06/13/2022 1047   PROTEINUR 30 (A) 11/03/2017 0933   UROBILINOGEN 0.2 09/04/2016 0855   NITRITE Negative 06/13/2022 1047   NITRITE NEGATIVE 11/03/2017 0933   LEUKOCYTESUR Negative 06/13/2022 1047   LEUKOCYTESUR Negative 06/04/2012 1008   Sepsis Labs: '@LABRCNTIP'$ (procalcitonin:4,lacticidven:4) )No results found for this or any previous visit (from the past 240 hour(s)).   Radiological Exams on Admission: US ABDOMEN LIMITED RUQ (LIVER/GB)  Result Date: 06/30/2022 CLINICAL DATA:  Elevated liver function tests EXAM: ULTRASOUND ABDOMEN LIMITED RIGHT UPPER QUADRANT COMPARISON:  Ultrasound 02/13/2022. CT 06/08/2022 and older exams for both FINDINGS: Gallbladder: Distended gallbladder. No shadowing stones. No wall thickening or adjacent fluid Common bile duct: Diameter: 2 mm Liver: Anechoic structure seen in the left hepatic lobe measuring 2.7 cm in diameter. Through transmission. Benign cyst as seen on prior CT. Portal vein is patent on color Doppler imaging with normal direction of blood flow towards the liver. Other: Note is made of the right kidney demonstrating some collecting system dilatation. This was not seen on the prior CT scan but there were stones. Please correlate with clinical presentation and if needed  follow-up abdomen and pelvis CT. Study limited by overlapping bowel gas and soft tissue. Trace ascites. IMPRESSION: Limited study due to overlapping bowel gas and soft tissue. No gallstones or ductal dilatation. Benign left hepatic lobe cyst as seen on prior exams. Trace ascites. Incidental note of collecting system dilatation of the right kidney of uncertain etiology. This was not seen on the prior CT scan of 06/08/2022. Please correlate for any known history or dedicated CT workup when appropriate Electronically Signed   By: Jill Side M.D.   On: 06/30/2022 12:05   DG Chest Portable 1 View  Result Date: 06/30/2022 CLINICAL DATA:  Chest pain starting at 3 a.m. EXAM: PORTABLE CHEST 1 VIEW COMPARISON:  11/09/2019 FINDINGS: Low lung volumes are present, causing crowding of the pulmonary vasculature. Elevated right hemidiaphragm. Upper normal heart size for projection. Atherosclerotic calcification of the aortic arch. Stable scarring along the left hemidiaphragm. The lungs appear otherwise clear. No findings of pulmonary edema or blunting of the costophrenic angles. IMPRESSION: 1. Low lung volumes are present, causing crowding of the pulmonary vasculature. 2. Stable scarring along the left hemidiaphragm. 3. Elevated right hemidiaphragm. Electronically Signed   By: Van Clines M.D.   On: 06/30/2022 08:13      Assessment/Plan Principal Problem:   NSTEMI (non-ST elevated myocardial infarction) ([place]) Active Problems:   CAD (coronary artery disease)   Essential (primary) hypertension   Chronic systolic CHF (congestive heart failure) (HCC)   HLD (hyperlipidemia)   COPD, moderate (HCC)   Depression   Abnormal LFTs   Prolonged QT interval   Hypokalemia   Morbid obesity (HCC)   Assessment and Plan:  NSTEMI (non-ST elevated myocardial infarction) and hx of CAD: s/p of DES. Trop  68 --> 83 --> 119. Consulted Dr. Clayborn Bigness of card.  - admit to tele bed as inpatient - IV heparin started in ED -  Trend Trop - prn Nitroglycerin, Morphine, and aspirin, plavix, crestor - Risk factor stratification: will check FLP and A1C   Essential (primary) hypertension -IV hydralazine as needed -Lisinopril  Chronic systolic CHF (congestive heart failure) ([place]): 2D echo on 02/14/2021 showed EF of 50%.  Patient does not have leg edema or JVD.  BNP 195, clinically does not seem to have CHF exacerbation.  Patient is currently not taking spironolactone. -Watch volume status closely  HLD (hyperlipidemia) -Crestor  COPD, moderate ([place]): Stable -Bronchodilators  Depression -Continue home medications: Zoloft  Abnormal liver function: Patient has  mild elevation of transaminase, due to bilirubin 1.1.  Right upper quadrant ultrasound showed no gallstones or ductal dilatation. -Check hepatitis panel -Avoid using Tylenol  Hypokalemia: Potassium 3.3, magnesium 2.2 -Repleted potassium  Prolonged QT interval: QTc 515 -Avoid using QT prolonging medications, such as Zofran -Telemetry monitoring -Repleted potassium  Morbid obesity ([place]): Body weight 115.7 kg, BMI -Healthy diet, exercise -Encouraged losing weight      DVT ppx: on IV Heparin    Code Status: Full code  Family Communication: I offered to call his family, but patient states that his wife was with him earlier, she knows what is going on for him.  He states that I do not need to call his family  Disposition Plan:  Anticipate discharge back to previous environment  Consults called: Dr. Clayborn Bigness of cardiology  Admission status and Level of care: Telemetry Cardiac:    as inpt      Dispo: The patient is from: Home              Anticipated d/c is to: Home              Anticipated d/c date is: 2 days              Patient currently is not medically stable to d/c.    Severity of Illness:  The appropriate patient status for this patient is INPATIENT. Inpatient status is judged to be reasonable and necessary in order to provide the  required intensity of service to ensure the patient's safety. The patient's presenting symptoms, physical exam findings, and initial radiographic and laboratory data in the context of their chronic comorbidities is felt to place them at high risk for further clinical deterioration. Furthermore, it is not anticipated that the patient will be medically stable for discharge from the hospital within 2 midnights of admission.   * I certify that at the point of admission it is my clinical judgment that the patient will require inpatient hospital care spanning beyond 2 midnights from the point of admission due to high intensity of service, high risk for further deterioration and high frequency of surveillance required.*       Date of Service 06/30/2022    Ivor Costa [facility]   If 7PM-7AM, please contact night-coverage [email or web address] 06/30/2022, 4:17 PM

## 2022-06-30 NOTE — Consult Note (Signed)
CARDIOLOGY CONSULT NOTE               Patient ID: William Armstrong MRN: [id number] DOB/AGE: [birth year] 72 y.o.  Admit date: 06/30/2022 Referring Physician Dr Ivor Costa hospitalist Primary Physician Dr. Meade Maw primary Primary Cardiologist Dr. Vicente Males heart Derm Reason for Consultation chest pain possible unstable angina borderline troponins  HPI: Patient is a 72 year old male with known coronary disease most recent PCI and stent with DES May 2023 due to patient had diastolic congestive heart failure hypertension hyperlipidemia COPD morbid obesity status post gastric bypass surgery patient was last over 270 pounds.  He has a past history of obstructive sleep apnea with weight loss that resolved patient states he has some dyspnea with exertion but chest pain is unusual he started having chest pain at home midsternal no significant radiation no nausea vomiting or diaphoresis family called rescue to come in to be evaluated.  Last saw Dr. Laurance Flatten few weeks ago and was doing reasonably well patient had been cleared recently for surgery and Plavix was instructed to be interrupted until after the procedure  Review of systems complete and found to be negative unless listed above     Past Medical History:  Diagnosis Date   Anemia    Arthritis    BCC (basal cell carcinoma of skin)    CHF (congestive heart failure) (HCC)    Complication of anesthesia    neck hurt for 2-3 days after general anesthesia   COPD (chronic obstructive pulmonary disease) ([place])    Coronary artery disease    Depression    Glaucoma    Heart attack ([place])    2013   History of kidney stones    HTN (hypertension)    Hyperglycemia    pt denies   Hypertension    Hypokalemia    Obesity    morbid   Sleep apnea    osa, CPAP has been dc'd after 249 lb weight loss    Past Surgical History:  Procedure Laterality Date   CORONARY STENT INTERVENTION N/A 11/10/2019   Procedure: CORONARY STENT INTERVENTION;   Surgeon: Isaias Cowman, MD;  Location: [facility];  Service: Cardiovascular;  Laterality: N/A;   CORONARY STENT PLACEMENT  02/2012   CYSTOSCOPY W/ RETROGRADES Left 11/18/2016   Procedure: CYSTOSCOPY WITH RETROGRADE PYELOGRAM;  Surgeon: Irine Seal, MD;  Location: [facility];  Service: Urology;  Laterality: Left;   CYSTOSCOPY WITH STENT PLACEMENT Left 11/18/2016   Procedure: CYSTOSCOPY WITH STENT PLACEMENT;  Surgeon: Irine Seal, MD;  Location: [facility];  Service: Urology;  Laterality: Left;   CYSTOSCOPY/URETEROSCOPY/HOLMIUM LASER/STENT PLACEMENT Left 11/29/2016   Procedure: CYSTOSCOPY/URETEROSCOPY/HOLMIUM LASER/STENT EXCHANGE;  Surgeon: Hollice Espy, MD;  Location: [facility];  Service: Urology;  Laterality: Left;   CYSTOSCOPY/URETEROSCOPY/HOLMIUM LASER/STENT PLACEMENT Right 03/07/2017   Procedure: CYSTOSCOPY/URETEROSCOPY/HOLMIUM LASER/STENT PLACEMENT;  Surgeon: Hollice Espy, MD;  Location: [facility];  Service: Urology;  Laterality: Right;   EXTRACORPOREAL SHOCK WAVE LITHOTRIPSY Left 02/03/2021   Procedure: EXTRACORPOREAL SHOCK WAVE LITHOTRIPSY (ESWL);  Surgeon: Hollice Espy, MD;  Location: [facility];  Service: Urology;  Laterality: Left;   EXTRACORPOREAL SHOCK WAVE LITHOTRIPSY Right 06/22/2022   Procedure: EXTRACORPOREAL SHOCK WAVE LITHOTRIPSY (ESWL);  Surgeon: Abbie Sons, MD;  Location: [facility];  Service: Urology;  Laterality: Right;   JOINT REPLACEMENT     right hip   LAPAROSCOPIC GASTRIC RESTRICTIVE DUODENAL PROCEDURE (DUODENAL SWITCH)     LAPAROSCOPIC GASTRIC SLEEVE RESECTION  08/21/2013   LEFT HEART CATH AND CORONARY ANGIOGRAPHY  N/A 11/10/2019   Procedure: LEFT HEART CATH AND CORONARY ANGIOGRAPHY;  Surgeon: Teodoro Spray, MD;  Location: [facility];  Service: Cardiovascular;  Laterality: N/A;   skin removal surgery     removal of extra skin approx 20 lbs   STONE EXTRACTION WITH BASKET Right 03/07/2017   Procedure: STONE EXTRACTION WITH BASKET;  Surgeon: Hollice Espy, MD;  Location: [facility];  Service: Urology;  Laterality: Right;   TONSILLECTOMY AND ADENOIDECTOMY  1960   TOTAL HIP ARTHROPLASTY Right 01/18/2017   Procedure: TOTAL HIP ARTHROPLASTY ANTERIOR APPROACH;  Surgeon: Hessie Knows, MD;  Location: [facility];  Service: Orthopedics;  Laterality: Right;    (Not in a hospital admission)  Social History   Socioeconomic History   Marital status: Married    Spouse name: Not on file   Number of children: Not on file   Years of education: Not on file   Highest education level: Not on file  Occupational History   Not on file  Tobacco Use   Smoking status: Never    Passive exposure: Never   Smokeless tobacco: Never  Vaping Use   Vaping Use: Never used  Substance and Sexual Activity   Alcohol use: No    Alcohol/week: 0.0 standard drinks of alcohol   Drug use: No   Sexual activity: Yes    Birth control/protection: None  Other Topics Concern   Not on file  Social History Narrative   Not on file   Social Determinants of Health   Financial Resource Strain: Not on file  Food Insecurity: Not on file  Transportation Needs: Not on file  Physical Activity: Not on file  Stress: Not on file  Social Connections: Not on file  Intimate Partner Violence: Not on file    Family History  Problem Relation Age of Onset   Heart attack Mother    Brain cancer Father    Heart attack Sister    Congenital heart disease Sister    Leukemia Paternal Grandmother    COPD Brother    Heart disease Brother    Prostate cancer Neg Hx    Kidney cancer Neg Hx    Bladder Cancer Neg Hx       Review of systems complete and found to be negative unless listed above      PHYSICAL EXAM  General: Well developed, well nourished, in no acute distress HEENT:  Normocephalic and atramatic Neck:  No JVD.  Lungs: Clear bilaterally to auscultation and percussion. Heart: HRRR . Normal S1 and S2 without gallops or murmurs.  Abdomen: Bowel sounds are positive,  abdomen soft and non-tender  Msk:  Back normal, normal gait. Normal strength and tone for age. Extremities: No clubbing, cyanosis or edema.   Neuro: Alert and oriented X 3. Psych:  Good affect, responds appropriately  Labs:   Lab Results  Component Value Date   WBC 7.2 06/30/2022   HGB 12.2 (L) 06/30/2022   HCT 37.8 (L) 06/30/2022   MCV 86.7 06/30/2022   PLT 141 (L) 06/30/2022    Recent Labs  Lab 06/30/22 0855  NA 137  K 3.3*  CL 106  CO2 20*  BUN 19  CREATININE 1.05  CALCIUM 7.9*  PROT 5.5*  BILITOT 1.1  ALKPHOS 85  ALT 57*  AST 71*  GLUCOSE 109*   Lab Results  Component Value Date   TROPONINI <0.03 01/30/2017    Lab Results  Component Value Date   CHOL 70 11/10/2019   Lab  Results  Component Value Date   HDL 29 (L) 11/10/2019   Lab Results  Component Value Date   LDLCALC 33 11/10/2019   Lab Results  Component Value Date   TRIG 40 11/10/2019   Lab Results  Component Value Date   CHOLHDL 2.4 11/10/2019   No results found for: "LDLDIRECT"    Radiology: US ABDOMEN LIMITED RUQ (LIVER/GB)  Result Date: 06/30/2022 CLINICAL DATA:  Elevated liver function tests EXAM: ULTRASOUND ABDOMEN LIMITED RIGHT UPPER QUADRANT COMPARISON:  Ultrasound 02/13/2022. CT 06/08/2022 and older exams for both FINDINGS: Gallbladder: Distended gallbladder. No shadowing stones. No wall thickening or adjacent fluid Common bile duct: Diameter: 2 mm Liver: Anechoic structure seen in the left hepatic lobe measuring 2.7 cm in diameter. Through transmission. Benign cyst as seen on prior CT. Portal vein is patent on color Doppler imaging with normal direction of blood flow towards the liver. Other: Note is made of the right kidney demonstrating some collecting system dilatation. This was not seen on the prior CT scan but there were stones. Please correlate with clinical presentation and if needed follow-up abdomen and pelvis CT. Study limited by overlapping bowel gas and soft tissue. Trace  ascites. IMPRESSION: Limited study due to overlapping bowel gas and soft tissue. No gallstones or ductal dilatation. Benign left hepatic lobe cyst as seen on prior exams. Trace ascites. Incidental note of collecting system dilatation of the right kidney of uncertain etiology. This was not seen on the prior CT scan of 06/08/2022. Please correlate for any known history or dedicated CT workup when appropriate Electronically Signed   By: Jill Side M.D.   On: 06/30/2022 12:05   DG Chest Portable 1 View  Result Date: 06/30/2022 CLINICAL DATA:  Chest pain starting at 3 a.m. EXAM: PORTABLE CHEST 1 VIEW COMPARISON:  11/09/2019 FINDINGS: Low lung volumes are present, causing crowding of the pulmonary vasculature. Elevated right hemidiaphragm. Upper normal heart size for projection. Atherosclerotic calcification of the aortic arch. Stable scarring along the left hemidiaphragm. The lungs appear otherwise clear. No findings of pulmonary edema or blunting of the costophrenic angles. IMPRESSION: 1. Low lung volumes are present, causing crowding of the pulmonary vasculature. 2. Stable scarring along the left hemidiaphragm. 3. Elevated right hemidiaphragm. Electronically Signed   By: Van Clines M.D.   On: 06/30/2022 08:13   DG Abd 1 View  Result Date: 06/22/2022 CLINICAL DATA:  Right ureteral stone preop EXAM: ABDOMEN - 1 VIEW COMPARISON:  CT 06/08/2022 FINDINGS: There is a 1.6 cm calcification correlating with the right UPJ stone seen on recent CT. There are additional punctate right renal stones and small left renal stones as seen on prior CT. Nonobstructive bowel gas pattern. Postsurgical changes in the upper abdomen. Degenerative changes of the spine. Prior right hip arthroplasty. Left hip osteoarthritis. IMPRESSION: 1.6 cm stone overlying the right ureteropelvic junction, as seen on recent CT. Additional small bilateral renal stones. Electronically Signed   By: Maurine Simmering M.D.   On: 06/22/2022 08:34   CT  RENAL STONE STUDY  Result Date: 06/08/2022 CLINICAL DATA:  Symptomatic urolithiasis.  Gross hematuria. EXAM: CT ABDOMEN AND PELVIS WITHOUT CONTRAST TECHNIQUE: Multidetector CT imaging of the abdomen and pelvis was performed following the standard protocol without IV contrast. RADIATION DOSE REDUCTION: This exam was performed according to the departmental dose-optimization program which includes automated exposure control, adjustment of the mA and/or kV according to patient size and/or use of iterative reconstruction technique. COMPARISON:  Most recent CT 01/24/2021 FINDINGS: Lower chest:  Elevated right hemidiaphragm with compressive atelectasis in the lower and middle lobes. No pleural effusion. Dense coronary artery calcifications. Hepatobiliary: Stable low-density lesions in the left lobe of the liver, previously characterized as cysts. Motion limits detailed assessment on the current exam. Mild gallbladder distention without calcified gallstone. Upper normal common bile duct at 7 mm. Pancreas: Motion obscured.  No ductal dilatation or inflammation. Spleen: Normal in size without focal abnormality. Adrenals/Urinary Tract: No adrenal nodule. 14 x 15 mm stone in the right renal pelvis at the ureteropelvic junction. There is slight calyceal distention no frank hydronephrosis. Three dish in a small nonobstructing stones are seen in the mid lower right kidney. There are 4 punctate intrarenal calculi on the left. No left hydronephrosis. Faint symmetric bilateral perinephric edema, similar to prior. Right renal cyst appears simple, no follow-up imaging is recommended. No evidence of ureteral stone, portions of the distal ureters are obscured by right hip arthroplasty. The urinary bladder is near completely empty and not well assessed. No urethral stone is seen Stomach/Bowel: Small hiatal hernia. Prior bariatric surgery with gastric suture line and enteric sutures in the central small bowel. No bowel obstruction or  inflammation. Normal appendix visualized. Moderate to large volume of colonic stool. The sigmoid colon is redundant and courses into the left mid abdomen. Mild distal left colonic diverticulosis without diverticulitis. Vascular/Lymphatic: Moderate aortic atherosclerosis. No aneurysm. No bulky adenopathy, allowing for motion. Reproductive: Chronically dilated partially calcified right seminal vesicle. Unremarkable prostate. Other: No ascites. Postsurgical changes the anterior abdominal wall, no abdominal wall hernia. Musculoskeletal: Degenerative disc disease and facet hypertrophy in the lumbar spine. Right hip arthroplasty. IMPRESSION: 1. A 14 x 15 mm stone in the right renal pelvis at the ureteropelvic junction with slight calyceal distention, likely intermittently obstructing. No frank hydronephrosis. 2. Additional nonobstructing bilateral intrarenal calculi. 3. Moderate to large volume of colonic stool, can be seen with constipation. Mild distal left colonic diverticulosis without diverticulitis. 4. Small hiatal hernia. 5. Additional stable chronic findings as described. Electronically Signed   By: Keith Rake M.D.   On: 06/08/2022 20:53    EKG: Normal sinus rhythm with bundle branch block nonspecific ST-T wave changes not significantly changed from EKG of 2021  ASSESSMENT AND PLAN:  Possible unstable angina So far this looks like demand ischemia not non-STEMI Known coronary artery disease History of PCI and stent multiple Congestive heart failure diastolic dysfunction Obesity Anemia COPD Obstructive sleep apnea in the past Kidney stone . Plan Possible unstable angina recommend anticoagulation with heparin follow-up troponins EKGs Consider echocardiogram for assessment of left ventricular function wall motion Continue statin therapy for hyperlipidemia with Crestor Hypertension management well spironolactone lisinopril poor beta-blocker candidate because of history of bradycardia History  of PCI and stent most recently May 2023 at [facility] continue Plavix therapy Maintain aspirin therapy 81 mg daily for arteriosclerotic vascular disease Consider nitrates and/or amlodipine for possible anginal symptoms Continue weight loss exercise portion control If patient rules out and no longer has chest pain symptoms able to ambulate will hopefully try to have the patient be treated as an outpatient back to Dr. Laurance Flatten in [facility] [place] [facility] If patient remains symptomatic we will consider cardiac cath prior to discharge   Signed: Yolonda Kida MD 06/30/2022, 2:09 PM

## 2022-07-01 DIAGNOSIS — I214 Non-ST elevation (NSTEMI) myocardial infarction: Secondary | ICD-10-CM | POA: Diagnosis not present

## 2022-07-01 LAB — BASIC METABOLIC PANEL
Anion gap: 7 (ref 5–15)
BUN: 18 mg/dL (ref 8–23)
CO2: 23 mmol/L (ref 22–32)
Calcium: 8.2 mg/dL — ABNORMAL LOW (ref 8.9–10.3)
Chloride: 105 mmol/L (ref 98–111)
Creatinine, Ser: 1.01 mg/dL (ref 0.61–1.24)
GFR, Estimated: >60 mL/min (ref >60)
Glucose, Bld: 105 mg/dL — ABNORMAL HIGH (ref 70–99)
Potassium: 4.4 mmol/L (ref 3.5–5.1)
Sodium: 135 mmol/L (ref 135–145)

## 2022-07-01 LAB — HEMOGLOBIN A1C
Hgb A1c MFr Bld: 4.9 % (ref 4.8–5.6)
Mean Plasma Glucose: 93.93 mg/dL

## 2022-07-01 LAB — HEPATITIS PANEL, ACUTE
HCV Ab: NONREACTIVE
Hep A IgM: NONREACTIVE
Hep B C IgM: NONREACTIVE
Hepatitis B Surface Ag: NONREACTIVE

## 2022-07-01 LAB — CBC
HCT: 36.6 % — ABNORMAL LOW (ref 39.0–52.0)
Hemoglobin: 11.6 g/dL — ABNORMAL LOW (ref 13.0–17.0)
MCH: 27.8 pg (ref 26.0–34.0)
MCHC: 31.7 g/dL (ref 30.0–36.0)
MCV: 87.8 fL (ref 80.0–100.0)
Platelets: 128 10*3/uL — ABNORMAL LOW (ref 150–400)
RBC: 4.17 MIL/uL — ABNORMAL LOW (ref 4.22–5.81)
RDW: 15.5 % (ref 11.5–15.5)
WBC: 7.9 10*3/uL (ref 4.0–10.5)
nRBC: 0 % (ref 0.0–0.2)

## 2022-07-01 LAB — LIPID PANEL
Cholesterol: 51 mg/dL (ref 0–200)
HDL: 30 mg/dL — ABNORMAL LOW (ref >40)
LDL Cholesterol: 14 mg/dL (ref 0–99)
Total CHOL/HDL Ratio: 1.7 RATIO
Triglycerides: 37 mg/dL (ref <150)
VLDL: 7 mg/dL (ref 0–40)

## 2022-07-01 LAB — TROPONIN I (HIGH SENSITIVITY)
Troponin I (High Sensitivity): 346 ng/L (ref <18)
Troponin I (High Sensitivity): 296 ng/L (ref <18)

## 2022-07-01 LAB — HEPARIN LEVEL (UNFRACTIONATED): Heparin Unfractionated: 0.16 IU/mL — ABNORMAL LOW (ref 0.30–0.70)

## 2022-07-01 LAB — CBG MONITORING, ED: Glucose-Capillary: 102 mg/dL — ABNORMAL HIGH (ref 70–99)

## 2022-07-01 MED ORDER — HEPARIN BOLUS VIA INFUSION
3000.0000 [IU] | Freq: Once | INTRAVENOUS | Status: AC
  Administered 2022-07-01: 3000 [IU] via INTRAVENOUS
  Filled 2022-07-01: qty 3000

## 2022-07-01 MED ORDER — CLOPIDOGREL BISULFATE 75 MG PO TABS: 75.0000 mg | ORAL_TABLET | Freq: Every day | ORAL | Status: DC

## 2022-07-01 NOTE — ED Notes (Signed)
Pt eating breakfast. Denies any new CP.

## 2022-07-01 NOTE — Care Management CC44 (Signed)
Condition Code 44 Documentation Completed  Patient Details  Name: William Armstrong MRN: [id number] Date of Birth: [birth year]   Condition Code 44 given:  Yes Patient signature on Condition Code 44 notice:  Yes Documentation of 2 MD's agreement:  Yes Code 44 added to claim:  Yes    Raina Mina, [place] 07/01/2022, 2:26 PM

## 2022-07-01 NOTE — ED Notes (Signed)
Troponin sent with chart label. [employer] printer malfunctioning. IT ticket placed.

## 2022-07-01 NOTE — ED Notes (Signed)
Pt got up to use toilet. Denies dizziness. Steady gait noted. Educated re: is on heparin drip so increased risk bleeding. Pt states he has been getting up and is fine and verbalized understanding of risks.

## 2022-07-01 NOTE — Consult Note (Addendum)
ANTICOAGULATION CONSULT NOTE  Pharmacy Consult for Heparin infusion Indication: chest pain/ACS  Allergies  Allergen Reactions   Atorvastatin Other (See Comments)    Muscle aches. Other reaction(s): Other (See Comments) Muscle aches. Muscle pain   Pregabalin Other (See Comments)    Muscle soreness Other reaction(s): Other (See Comments) Muscle soreness Severe muscle pain    Patient Measurements: Wt 115.7 kg Ht 5'7" IBW 66.1 kg Heparin Dosing Weight: 92.5 kg  Vital Signs: BP: 152/83 (01/27 0530) Pulse Rate: 63 (01/27 0530)  Labs: Recent Labs    06/30/22 0750 06/30/22 0855 06/30/22 0947 06/30/22 1109 06/30/22 1322 06/30/22 1600 06/30/22 1946 07/01/22 0512  HGB 12.2*  --   --   --   --   --   --  11.6*  HCT 37.8*  --   --   --   --   --   --  36.6*  PLT 141*  --   --   --   --   --   --  128*  APTT  --   --   --  28  --   --   --   --   LABPROT  --   --   --  14.6  --   --   --   --   INR  --   --   --  1.1  --   --   --   --   HEPARINUNFRC  --   --   --   --   --   --  0.12* 0.16*  CREATININE  --  1.05  --   --   --   --   --   --   TROPONINIHS  --  68*   < >  --  119* 127* 171*  --    < > = values in this interval not displayed.     Estimated Creatinine Clearance: 76.1 mL/min (by C-G formula based on SCr of 1.05 mg/dL).   Medical History: Past Medical History:  Diagnosis Date   Anemia    Arthritis    BCC (basal cell carcinoma of skin)    CHF (congestive heart failure) (HCC)    Complication of anesthesia    neck hurt for 2-3 days after general anesthesia   COPD (chronic obstructive pulmonary disease) (HCC)    Coronary artery disease    Depression    Glaucoma    Heart attack ([place])    2013   History of kidney stones    HTN (hypertension)    Hyperglycemia    pt denies   Hypertension    Hypokalemia    Obesity    morbid   Sleep apnea    osa, CPAP has been dc'd after 249 lb weight loss    Medications:  NO AC prior to  admission.  Assessment: Patient with hx of HF, HTN, MI,a d sleep apnea. Presents to ED with checsp pain. Pharmacy consulted to manage heparin infusion for ACS. No AC prior to admission. Compliance with plavix '75mg'$  - last fill 1/202024).   Goal of Therapy:  Heparin level 0.3-0.7 units/ml Monitor platelets by anticoagulation protocol: Yes   Date Time HL Rate/Comment 1/26 1946 0.12 Subtherapeutic/ 1200 > 1550 u/hr 1/27 0512 0.16 Subtherapeutic   Plan:  Give 3000 units bolus x1 Increase heparin infusion to 1800 units/hr Recheck HL in 8 hrs after rate change Continue to monitor H&H and platelets daily while on heparin gtt.  Renda Rolls,  PharmD, [facility] 07/01/2022 5:50 AM

## 2022-07-01 NOTE — ED Notes (Signed)
Informed Dr Priscella Mann of trop results 346.

## 2022-07-01 NOTE — Discharge Summary (Signed)
Physician Discharge Summary  William Armstrong QIH:[id number] DOB: 1950-07-05 DOA: 06/30/2022  PCP: Baxter Hire, MD  Admit date: 06/30/2022 Discharge date: 07/01/2022  Admitted From: Home Disposition:  Home  Recommendations for Outpatient Follow-up:  Follow up with PCP in 1-2 weeks Follow up with cardiology 1-2 weeks  Home Health:No  Equipment/Devices:None   Discharge Condition:Stable  CODE STATUS:FULL  Diet recommendation: Heart healthy  Brief/Interim Summary: 72 y.o. male with medical history significant of CAD, DES stent placement, diastolic CHF, hypertension, hyperlipidemia, COPD, depression, kidney stone, obesity who presents with chest pain since this morning.    Patient states that his chest pain started in the early morning, which is located in the substernal area, mild to moderate, pressure and dull, nonradiating.  Not associated with cough or shortness breath.  No fever or chills.  Patient does not have nausea vomiting, diarrhea or abdominal pain.  No symptoms of UTI.  Denies any rectal bleeding or dark stool.  No recent fall or head injury.  Cardiology consulted on admission.  Patient was placed on heparin gtt.  Troponins trended.  High-sensitivity troponin peaked at 346, subsequently downtrending.  Chest pain resolved.  Patient ambulating without difficulty on day of discharge.  Discussed with cardiology.  Cleared for discharge home at this time.  Patient needs to follow-up with his established outpatient cardiologist post DC.    Discharge Diagnoses:  Principal Problem:   NSTEMI (non-ST elevated myocardial infarction) ([place]) Active Problems:   CAD (coronary artery disease)   Essential (primary) hypertension   Chronic systolic CHF (congestive heart failure) (HCC)   HLD (hyperlipidemia)   COPD, moderate (HCC)   Depression   Abnormal LFTs   Prolonged QT interval   Hypokalemia   Morbid obesity ([place])  NSTEMI, ruled out No chest pain.  Troponin trend inconsistent with  NSTEMI.  Atypical chest pain CAD status post DES in May 2023 Unable to totally exclude unstable angina.  Troponins peaked at 346, subsequently downtrending.  No chest pain.  Was on intravenous heparin.  This to be discontinued.  No indication for invasive ischemic evaluation during this admission.  Seen by cardiology in consultation.  No changes made at home medication regimen.  Will resume dual antiplatelet therapy.  Follow-up outpatient with established cardiology.  Discharge Instructions  Discharge Instructions     Diet - low sodium heart healthy   Complete by: As directed    Increase activity slowly   Complete by: As directed       Allergies as of 07/01/2022       Reactions   Atorvastatin Other (See Comments)   Muscle aches. Other reaction(s): Other (See Comments) Muscle aches. Muscle pain   Pregabalin Other (See Comments)   Muscle soreness Other reaction(s): Other (See Comments) Muscle soreness Severe muscle pain        Medication List     TAKE these medications    aspirin EC 81 MG tablet Take 81 mg by mouth daily.   clopidogrel 75 MG tablet Commonly known as: PLAVIX Take 75 mg by mouth daily.   ferrous sulfate 325 (65 FE) MG EC tablet Take 325 mg by mouth daily with breakfast.   latanoprost 0.005 % ophthalmic solution Commonly known as: XALATAN Place 1 drop into both eyes at bedtime.   lisinopril 40 MG tablet Commonly known as: ZESTRIL Take 40 mg by mouth daily.   nitroGLYCERIN 0.4 MG SL tablet Commonly known as: NITROSTAT Place 0.4 mg under the tongue every 5 (five) minutes as needed for  chest pain.   rosuvastatin 40 MG tablet Commonly known as: CRESTOR Take 1 tablet by mouth daily.   sertraline 100 MG tablet Commonly known as: ZOLOFT Take 1 tablet (100 mg total) by mouth daily.   spironolactone 25 MG tablet Commonly known as: ALDACTONE Take 25 mg by mouth daily.   tamsulosin 0.4 MG Caps capsule Commonly known as: FLOMAX TAKE 1 CAPSULE  BY MOUTH ONCE DAILY WHILEPASSING STONES   Vitamin D 50 MCG (2000 UT) Caps Take 2,000 Units by mouth daily.        Allergies  Allergen Reactions   Atorvastatin Other (See Comments)    Muscle aches. Other reaction(s): Other (See Comments) Muscle aches. Muscle pain   Pregabalin Other (See Comments)    Muscle soreness Other reaction(s): Other (See Comments) Muscle soreness Severe muscle pain    Consultations: Cardiology-[facility]   Procedures/Studies: US ABDOMEN LIMITED RUQ (LIVER/GB)  Result Date: 06/30/2022 CLINICAL DATA:  Elevated liver function tests EXAM: ULTRASOUND ABDOMEN LIMITED RIGHT UPPER QUADRANT COMPARISON:  Ultrasound 02/13/2022. CT 06/08/2022 and older exams for both FINDINGS: Gallbladder: Distended gallbladder. No shadowing stones. No wall thickening or adjacent fluid Common bile duct: Diameter: 2 mm Liver: Anechoic structure seen in the left hepatic lobe measuring 2.7 cm in diameter. Through transmission. Benign cyst as seen on prior CT. Portal vein is patent on color Doppler imaging with normal direction of blood flow towards the liver. Other: Note is made of the right kidney demonstrating some collecting system dilatation. This was not seen on the prior CT scan but there were stones. Please correlate with clinical presentation and if needed follow-up abdomen and pelvis CT. Study limited by overlapping bowel gas and soft tissue. Trace ascites. IMPRESSION: Limited study due to overlapping bowel gas and soft tissue. No gallstones or ductal dilatation. Benign left hepatic lobe cyst as seen on prior exams. Trace ascites. Incidental note of collecting system dilatation of the right kidney of uncertain etiology. This was not seen on the prior CT scan of 06/08/2022. Please correlate for any known history or dedicated CT workup when appropriate Electronically Signed   By: Jill Side M.D.   On: 06/30/2022 12:05   DG Chest Portable 1 View  Result Date: 06/30/2022 CLINICAL  DATA:  Chest pain starting at 3 a.m. EXAM: PORTABLE CHEST 1 VIEW COMPARISON:  11/09/2019 FINDINGS: Low lung volumes are present, causing crowding of the pulmonary vasculature. Elevated right hemidiaphragm. Upper normal heart size for projection. Atherosclerotic calcification of the aortic arch. Stable scarring along the left hemidiaphragm. The lungs appear otherwise clear. No findings of pulmonary edema or blunting of the costophrenic angles. IMPRESSION: 1. Low lung volumes are present, causing crowding of the pulmonary vasculature. 2. Stable scarring along the left hemidiaphragm. 3. Elevated right hemidiaphragm. Electronically Signed   By: Van Clines M.D.   On: 06/30/2022 08:13   DG Abd 1 View  Result Date: 06/22/2022 CLINICAL DATA:  Right ureteral stone preop EXAM: ABDOMEN - 1 VIEW COMPARISON:  CT 06/08/2022 FINDINGS: There is a 1.6 cm calcification correlating with the right UPJ stone seen on recent CT. There are additional punctate right renal stones and small left renal stones as seen on prior CT. Nonobstructive bowel gas pattern. Postsurgical changes in the upper abdomen. Degenerative changes of the spine. Prior right hip arthroplasty. Left hip osteoarthritis. IMPRESSION: 1.6 cm stone overlying the right ureteropelvic junction, as seen on recent CT. Additional small bilateral renal stones. Electronically Signed   By: Maurine Simmering M.D.   On:  06/22/2022 08:34   CT RENAL STONE STUDY  Result Date: 06/08/2022 CLINICAL DATA:  Symptomatic urolithiasis.  Gross hematuria. EXAM: CT ABDOMEN AND PELVIS WITHOUT CONTRAST TECHNIQUE: Multidetector CT imaging of the abdomen and pelvis was performed following the standard protocol without IV contrast. RADIATION DOSE REDUCTION: This exam was performed according to the departmental dose-optimization program which includes automated exposure control, adjustment of the mA and/or kV according to patient size and/or use of iterative reconstruction technique. COMPARISON:   Most recent CT 01/24/2021 FINDINGS: Lower chest: Elevated right hemidiaphragm with compressive atelectasis in the lower and middle lobes. No pleural effusion. Dense coronary artery calcifications. Hepatobiliary: Stable low-density lesions in the left lobe of the liver, previously characterized as cysts. Motion limits detailed assessment on the current exam. Mild gallbladder distention without calcified gallstone. Upper normal common bile duct at 7 mm. Pancreas: Motion obscured.  No ductal dilatation or inflammation. Spleen: Normal in size without focal abnormality. Adrenals/Urinary Tract: No adrenal nodule. 14 x 15 mm stone in the right renal pelvis at the ureteropelvic junction. There is slight calyceal distention no frank hydronephrosis. Three dish in a small nonobstructing stones are seen in the mid lower right kidney. There are 4 punctate intrarenal calculi on the left. No left hydronephrosis. Faint symmetric bilateral perinephric edema, similar to prior. Right renal cyst appears simple, no follow-up imaging is recommended. No evidence of ureteral stone, portions of the distal ureters are obscured by right hip arthroplasty. The urinary bladder is near completely empty and not well assessed. No urethral stone is seen Stomach/Bowel: Small hiatal hernia. Prior bariatric surgery with gastric suture line and enteric sutures in the central small bowel. No bowel obstruction or inflammation. Normal appendix visualized. Moderate to large volume of colonic stool. The sigmoid colon is redundant and courses into the left mid abdomen. Mild distal left colonic diverticulosis without diverticulitis. Vascular/Lymphatic: Moderate aortic atherosclerosis. No aneurysm. No bulky adenopathy, allowing for motion. Reproductive: Chronically dilated partially calcified right seminal vesicle. Unremarkable prostate. Other: No ascites. Postsurgical changes the anterior abdominal wall, no abdominal wall hernia. Musculoskeletal: Degenerative  disc disease and facet hypertrophy in the lumbar spine. Right hip arthroplasty. IMPRESSION: 1. A 14 x 15 mm stone in the right renal pelvis at the ureteropelvic junction with slight calyceal distention, likely intermittently obstructing. No frank hydronephrosis. 2. Additional nonobstructing bilateral intrarenal calculi. 3. Moderate to large volume of colonic stool, can be seen with constipation. Mild distal left colonic diverticulosis without diverticulitis. 4. Small hiatal hernia. 5. Additional stable chronic findings as described. Electronically Signed   By: Keith Rake M.D.   On: 06/08/2022 20:53      Subjective: Seen and examined the day of discharge.  Stable no distress.  Chest pain resolved.  Ambulating without difficulty.  Appropriate for discharge home.  Discharge Exam: Vitals:   07/01/22 1200 07/01/22 1239  BP: (!) 152/71   Pulse: 79   Resp: 14   Temp:  97.8 F (36.6 C)  SpO2: 93%    Vitals:   07/01/22 0800 07/01/22 0930 07/01/22 1200 07/01/22 1239  BP: (!) 161/77 133/67 (!) 152/71   Pulse: 70 (!) 54 79   Resp: 20 (!) 21 14   Temp:    97.8 F (36.6 C)  TempSrc:    Oral  SpO2: 91% 91% 93%     General: Pt is alert, awake, not in acute distress Cardiovascular: RRR, S1/S2 +, no rubs, no gallops Respiratory: CTA bilaterally, no wheezing, no rhonchi Abdominal: Soft, NT, ND, bowel sounds +  Extremities: no edema, no cyanosis    The results of significant diagnostics from this hospitalization (including imaging, microbiology, ancillary and laboratory) are listed below for reference.     Microbiology: No results found for this or any previous visit (from the past 240 hour(s)).   Labs: BNP (last 3 results) Recent Labs    06/30/22 0750  BNP 626.9*   Basic Metabolic Panel: Recent Labs  Lab 06/30/22 0855 06/30/22 0947 07/01/22 0512  NA 137  --  135  K 3.3*  --  4.4  CL 106  --  105  CO2 20*  --  23  GLUCOSE 109*  --  105*  BUN 19  --  18  CREATININE 1.05   --  1.01  CALCIUM 7.9*  --  8.2*  MG  --  2.0  --    Liver Function Tests: Recent Labs  Lab 06/30/22 0855  AST 71*  ALT 57*  ALKPHOS 85  BILITOT 1.1  PROT 5.5*  ALBUMIN 3.1*   No results for input(s): "LIPASE", "AMYLASE" in the last 168 hours. No results for input(s): "AMMONIA" in the last 168 hours. CBC: Recent Labs  Lab 06/30/22 0750 07/01/22 0512  WBC 7.2 7.9  NEUTROABS 5.6  --   HGB 12.2* 11.6*  HCT 37.8* 36.6*  MCV 86.7 87.8  PLT 141* 128*   Cardiac Enzymes: No results for input(s): "CKTOTAL", "CKMB", "CKMBINDEX", "TROPONINI" in the last 168 hours. BNP: Invalid input(s): "POCBNP" CBG: Recent Labs  Lab 07/01/22 0755  GLUCAP 102*   D-Dimer No results for input(s): "DDIMER" in the last 72 hours. Hgb A1c No results for input(s): "HGBA1C" in the last 72 hours. Lipid Profile Recent Labs    07/01/22 0512  CHOL 51  HDL 30*  LDLCALC 14  TRIG 37  CHOLHDL 1.7   Thyroid function studies No results for input(s): "TSH", "T4TOTAL", "T3FREE", "THYROIDAB" in the last 72 hours.  Invalid input(s): "FREET3" Anemia work up No results for input(s): "VITAMINB12", "FOLATE", "FERRITIN", "TIBC", "IRON", "RETICCTPCT" in the last 72 hours. Urinalysis    Component Value Date/Time   COLORURINE AMBER (A) 11/03/2017 0933   APPEARANCEUR Cloudy (A) 06/13/2022 1047   LABSPEC 1.018 11/03/2017 0933   LABSPEC 1.034 06/04/2012 1008   PHURINE 5.0 11/03/2017 0933   GLUCOSEU Negative 06/13/2022 1047   GLUCOSEU Negative 06/04/2012 1008   HGBUR LARGE (A) 11/03/2017 0933   BILIRUBINUR Negative 06/13/2022 1047   BILIRUBINUR Negative 06/04/2012 1008   KETONESUR NEGATIVE 11/03/2017 0933   PROTEINUR 2+ (A) 06/13/2022 1047   PROTEINUR 30 (A) 11/03/2017 0933   UROBILINOGEN 0.2 09/04/2016 0855   NITRITE Negative 06/13/2022 1047   NITRITE NEGATIVE 11/03/2017 0933   LEUKOCYTESUR Negative 06/13/2022 1047   LEUKOCYTESUR Negative 06/04/2012 1008   Sepsis Labs Recent Labs  Lab  06/30/22 0750 07/01/22 0512  WBC 7.2 7.9   Microbiology No results found for this or any previous visit (from the past 240 hour(s)).   Time coordinating discharge: Over 30 minutes  SIGNED:   Sidney Ace, MD  [facility] 07/01/2022, 1:25 PM Pager   If 7PM-7AM, please contact night-coverage

## 2022-07-01 NOTE — Progress Notes (Signed)
[facility] Cardiology    SUBJECTIVE: Patient states improved symptoms denies any worsening shortness of breath or chest heaviness or pressure.  Patient feels well enough to be discharged home and ready to follow-up with his primary cardiologist    Vitals:   07/01/22 0730 07/01/22 0758 07/01/22 0800 07/01/22 0930  BP: (!) 162/57  (!) 161/77 133/67  Pulse: (!) 46  70 (!) 54  Resp:   20 (!) 21  Temp:  97.8 F (36.6 C)    TempSrc:  Oral    SpO2: 92%  91% 91%    No intake or output data in the 24 hours ending 07/01/22 1128    PHYSICAL EXAM  General: Well developed, well nourished, in no acute distress HEENT:  Normocephalic and atramatic Neck:  No JVD.  Lungs: Clear bilaterally to auscultation and percussion. Heart: HRRR . Normal S1 and S2 without gallops or murmurs.  Abdomen: Bowel sounds are positive, abdomen soft and non-tender  Msk:  Back normal, normal gait. Normal strength and tone for age. Extremities: No clubbing, cyanosis or edema.   Neuro: Alert and oriented X 3. Psych:  Good affect, responds appropriately   LABS: Basic Metabolic Panel: Recent Labs    06/30/22 0855 06/30/22 0947 07/01/22 0512  NA 137  --  135  K 3.3*  --  4.4  CL 106  --  105  CO2 20*  --  23  GLUCOSE 109*  --  105*  BUN 19  --  18  CREATININE 1.05  --  1.01  CALCIUM 7.9*  --  8.2*  MG  --  2.0  --    Liver Function Tests: Recent Labs    06/30/22 0855  AST 71*  ALT 57*  ALKPHOS 85  BILITOT 1.1  PROT 5.5*  ALBUMIN 3.1*   No results for input(s): "LIPASE", "AMYLASE" in the last 72 hours. CBC: Recent Labs    06/30/22 0750 07/01/22 0512  WBC 7.2 7.9  NEUTROABS 5.6  --   HGB 12.2* 11.6*  HCT 37.8* 36.6*  MCV 86.7 87.8  PLT 141* 128*   Cardiac Enzymes: No results for input(s): "CKTOTAL", "CKMB", "CKMBINDEX", "TROPONINI" in the last 72 hours. BNP: Invalid input(s): "POCBNP" D-Dimer: No results for input(s): "DDIMER" in the last 72 hours. Hemoglobin A1C: No results for  input(s): "HGBA1C" in the last 72 hours. Fasting Lipid Panel: Recent Labs    07/01/22 0512  CHOL 51  HDL 30*  LDLCALC 14  TRIG 37  CHOLHDL 1.7   Thyroid Function Tests: No results for input(s): "TSH", "T4TOTAL", "T3FREE", "THYROIDAB" in the last 72 hours.  Invalid input(s): "FREET3" Anemia Panel: No results for input(s): "VITAMINB12", "FOLATE", "FERRITIN", "TIBC", "IRON", "RETICCTPCT" in the last 72 hours.  US ABDOMEN LIMITED RUQ (LIVER/GB)  Result Date: 06/30/2022 CLINICAL DATA:  Elevated liver function tests EXAM: ULTRASOUND ABDOMEN LIMITED RIGHT UPPER QUADRANT COMPARISON:  Ultrasound 02/13/2022. CT 06/08/2022 and older exams for both FINDINGS: Gallbladder: Distended gallbladder. No shadowing stones. No wall thickening or adjacent fluid Common bile duct: Diameter: 2 mm Liver: Anechoic structure seen in the left hepatic lobe measuring 2.7 cm in diameter. Through transmission. Benign cyst as seen on prior CT. Portal vein is patent on color Doppler imaging with normal direction of blood flow towards the liver. Other: Note is made of the right kidney demonstrating some collecting system dilatation. This was not seen on the prior CT scan but there were stones. Please correlate with clinical presentation and if needed follow-up abdomen and pelvis CT.  Study limited by overlapping bowel gas and soft tissue. Trace ascites. IMPRESSION: Limited study due to overlapping bowel gas and soft tissue. No gallstones or ductal dilatation. Benign left hepatic lobe cyst as seen on prior exams. Trace ascites. Incidental note of collecting system dilatation of the right kidney of uncertain etiology. This was not seen on the prior CT scan of 06/08/2022. Please correlate for any known history or dedicated CT workup when appropriate Electronically Signed   By: Jill Side M.D.   On: 06/30/2022 12:05   DG Chest Portable 1 View  Result Date: 06/30/2022 CLINICAL DATA:  Chest pain starting at 3 a.m. EXAM: PORTABLE  CHEST 1 VIEW COMPARISON:  11/09/2019 FINDINGS: Low lung volumes are present, causing crowding of the pulmonary vasculature. Elevated right hemidiaphragm. Upper normal heart size for projection. Atherosclerotic calcification of the aortic arch. Stable scarring along the left hemidiaphragm. The lungs appear otherwise clear. No findings of pulmonary edema or blunting of the costophrenic angles. IMPRESSION: 1. Low lung volumes are present, causing crowding of the pulmonary vasculature. 2. Stable scarring along the left hemidiaphragm. 3. Elevated right hemidiaphragm. Electronically Signed   By: Van Clines M.D.   On: 06/30/2022 08:13     Echo preserved left ventricular function EF around 50% from 02/2021  TELEMETRY: Normal sinus rhythm rate of 75 nonspecific ST-T wave changes:  ASSESSMENT AND PLAN:  Principal Problem:   NSTEMI (non-ST elevated myocardial infarction) ([place]) Active Problems:   COPD, moderate (HCC)   Essential (primary) hypertension   HLD (hyperlipidemia)   CAD (coronary artery disease)   Morbid obesity (H[facility])   Chronic systolic CHF (congestive heart failure) (H[facility])   Depression   Prolonged QT interval   Abnormal LFTs   Hypokalemia    Plan Borderline troponins relatively low but may be consistent with demand ischemia doubt non-STEMI recommend continue medical therapy aspirin Plavix Possible anginal symptoms improve recommend follow-up with primary cardiologist consider either further workup advancing medical therapy or invasive evaluation Obesity recommend significant weight loss exercise portion control Chronic systolic congestive heart failure reasonably compensated continue current medical therapy COPD mild to moderate continue inhalers as necessary for management and control Hypertension reasonably managed continue current medical therapy with lisinopril spironolactone Hyperlipidemia continue Crestor therapy for lipid management Have the patient follow-up with regular  cardiologist next week with Dr. Laurance Flatten at [facility] heart return to emergency room if symptoms recur would consider cardiac cath at that point   Yolonda Kida, MD, 07/01/2022 11:28 AM

## 2022-07-01 NOTE — ED Notes (Signed)
Dr Sreenath at bedside 

## 2022-07-03 NOTE — Telephone Encounter (Signed)
Patient advised and scheduled.  

## 2022-07-03 NOTE — Telephone Encounter (Signed)
Left message to call back to schedule.

## 2022-07-07 ENCOUNTER — Encounter: Payer: Self-pay | Admitting: Urology

## 2022-07-07 ENCOUNTER — Telehealth: Payer: Medicare Other | Admitting: Urology

## 2022-07-07 ENCOUNTER — Telehealth: Payer: Self-pay | Admitting: Urology

## 2022-07-07 VITALS — Ht 67.5 in | Wt 247.0 lb

## 2022-07-07 DIAGNOSIS — N200 Calculus of kidney: Secondary | ICD-10-CM

## 2022-07-07 NOTE — Progress Notes (Signed)
Virtual Visit via Video Note  I, Gery Pray Plume,acting as a scribe for Hollice Espy, MD.,have documented all relevant documentation on the behalf of Hollice Espy, MD,as directed by  Hollice Espy, MD while in the presence of Hollice Espy, MD.   I connected with [name] on 07/07/2022 at  9:15 AM EST by a video enabled telemedicine application and verified that I am speaking with the correct person using two identifiers.  Location: Patient: [facility]um[facility] Provider: Dr. Hollice Espy   I discussed the limitations of evaluation and management by telemedicine and the availability of in person appointments. The patient expressed understanding and agreed to proceed.  History of Present Illness:   72 year-old male who is seen today via virtual visit for a further discussion regarding his right kidney stone.   He indicates that he had a lithotripsy procedure scheduled, but it could not be carried out due to an irregular heart rate and chest discomfort. He was given a Valium prior to the procedure and he reports that his heart rate dropped to below 40 bpm. Consequently, he was admitted to the hospital for further observation and treatment.  He did have a slight bump in his troponins but is felt to likely represent unstable angina did not meet criteria for NSTEMI.  He continues on maximal antiplatelet therapy in the form of aspirin and Plavix.  He has not yet scheduled a follow-up appointment with his cardiologist, but intends to do so after this current appointment.   Additionally, he mentions that he passed a kidney stone shortly after his discharge from the hospital.  This was punctate and he showed me today a very small stone in a plastic baggy.  He reports after passing this punctate stone his pain and bleeding resolved.  He denies any pain from the remaining kidney stone at this time and no other urinary symptoms.   Objective/Objective  Patient appears well.   Assessment and  Plan:  Right kidney stone - He recently had a cardiac event, possibly unstable angina with overnight admission just prior to the lithotripsy.  - He is yet to follow up with his cardiologist, scheduled to see him February 6 - We discussed treatment options versus a second attempt at shockwave which would necessitate holding aspirin and Plavix versus ureteroscopy where only Plavix would need to be held.  - We will have to obtain cardiac clearance from Dr. Laurance Flatten at [facility] regarding which of the procedures he feels would ve rthe safest for this patient in terms of holding his anti-platelet therapy.  -Mr. Hewins is amenable to either procedure, has had both done, and understand the risks and benefits.   Follow Up Instructions:   Follow up after seeing his cardiologist, Dr. Laurance Flatten.   I discussed the assessment and treatment plan with the patient. The patient was provided an opportunity to ask questions and all were answered. The patient agreed with the plan and demonstrated an understanding of the instructions.   The patient was advised to call back or seek an in-person evaluation if the symptoms worsen or if the condition fails to improve as anticipated.  I provided 7 minutes of non-face-to-face time during this encounter.  I have reviewed the above documentation for accuracy and completeness, and I agree with the above.   Hollice Espy, MD

## 2022-07-07 NOTE — Telephone Encounter (Signed)
Will follow.

## 2022-07-07 NOTE — Telephone Encounter (Signed)
This patient is seeing his cardiologist on 07/11/2022.  He was unable to undergo lithotripsy for his right sided kidney stone due to a cardiac event.  In order to move forward with treating the stone, he needs to have cardiac clearance.  If his cardiologist feels like we can hold both aspirin and Plavix again, please schedule ESWL.  Otherwise, we could proceed with right ureteroscopy, laser lithotripsy and right ureteral stent placement which can be done on aspirin.  Please let me know what the cardiologist says and then I will place orders accordingly.  Hollice Espy, MD

## 2022-07-16 ENCOUNTER — Encounter: Payer: Self-pay | Admitting: Urology

## 2022-07-17 ENCOUNTER — Telehealth: Payer: Self-pay

## 2022-07-17 NOTE — Telephone Encounter (Signed)
Clearance Faxed over today.

## 2022-07-17 NOTE — Telephone Encounter (Signed)
  Phone Number: [phone] for Surgical Coordinator Fax Number: [phone]  REQUEST FOR SURGICAL CLEARANCE     Date: Date: 07/17/2022  Faxed to: Dr. Laurance Flatten  Surgeon: Dr. Hollice Espy     Date of Surgery: TBD  Operation: - We discussed treatment options versus a second attempt at Extracorporeal Shock Wave which would necessitate holding aspirin and Plavix versus Ureteroscopy where only Plavix would need to be held.  Anesthesia Type: TBD   Diagnosis: Kidney Stone  Patient Requires:   Cardiac / Vascular Clearance : Yes  - We will have to obtain cardiac clearance from Dr. Laurance Flatten at [facility] regarding which of the procedures he feels would be the safest for this patient in terms of holding his anti-platelet therapy.    Risk Assessment:    Low   '[]'$       Moderate   '[]'$     High   '[]'$           This patient is optimized for surgery  YES '[]'$       NO   '[]'$    I recommend further assessment/workup prior to surgery. YES '[]'$      NO  '[]'$   Appointment scheduled for: _______________________   Further recommendations: ____________________________________     Physician Signature:__________________________________   Printed Name: ________________________________________   Date: _________________

## 2022-07-21 ENCOUNTER — Other Ambulatory Visit: Payer: Self-pay | Admitting: Physician Assistant

## 2022-07-21 DIAGNOSIS — N201 Calculus of ureter: Secondary | ICD-10-CM

## 2022-07-21 NOTE — Progress Notes (Unsigned)
Surgical Physician Order [facility]  Dr. Erlene Quan * Scheduling expectation : Next Available  *Length of Case:   *Clearance needed: yes, cardiac  *Anticoagulation Instructions: N/A  *Aspirin Instructions:  Hold Plavix x7 days, continue aspirin  *Post-op visit Date/Instructions:   TBD  *Diagnosis: Right UPJ Stone  *Procedure: right Ureteroscopy w/laser lithotripsy & stent placement [id number])   Additional orders: N/A  -Admit type: OUTpatient  -Anesthesia: General  -VTE Prophylaxis Standing Order SCD's       Other:   -Standing Lab Orders Per Anesthesia    Lab other: UA&Urine Culture  -Standing Test orders EKG/Chest x-ray per Anesthesia       Test other:   - Medications:  Ancef 2gm IV  -Other orders:  N/A

## 2022-07-21 NOTE — Telephone Encounter (Signed)
Spoke with pt. And cardiologist, scheduled for [facility] on 08/07/2022 with Dr. Erlene Quan, MD

## 2022-07-25 ENCOUNTER — Telehealth: Payer: Self-pay

## 2022-07-25 NOTE — Progress Notes (Signed)
   [facility]  Surgery Date: Date: 08/07/2022  Surgeon: Dr. 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

## 2022-07-25 NOTE — Telephone Encounter (Signed)
I spoke with Mr. William Armstrong. We have discussed possible surgery dates and Monday March 4th, 2024 was agreed upon by all parties. Patient given information about surgery date, what to expect pre-operatively and post operatively.  We discussed that a Pre-Admission Testing office will be calling to set up the pre-op visit that will take place prior to surgery, and that these appointments are typically done over the phone with a Pre-Admissions RN. Informed patient that our office will communicate any additional care to be provided after surgery. Patients questions or concerns were discussed during our call. Advised to call our office should there be any additional information, questions or concerns that arise. Patient verbalized understanding.

## 2022-07-27 ENCOUNTER — Other Ambulatory Visit: Payer: Medicare Other

## 2022-07-27 DIAGNOSIS — N201 Calculus of ureter: Secondary | ICD-10-CM

## 2022-07-27 LAB — MICROSCOPIC EXAMINATION

## 2022-07-27 LAB — URINALYSIS, COMPLETE
Bilirubin, UA: NEGATIVE
Glucose, UA: NEGATIVE
Ketones, UA: NEGATIVE
Leukocytes,UA: NEGATIVE
Nitrite, UA: NEGATIVE
Specific Gravity, UA: 1.02 (ref 1.005–1.030)
Urobilinogen, Ur: 0.2 mg/dL (ref 0.2–1.0)
pH, UA: 5 (ref 5.0–7.5)

## 2022-07-31 ENCOUNTER — Other Ambulatory Visit: Payer: Self-pay

## 2022-07-31 ENCOUNTER — Encounter
Admission: RE | Admit: 2022-07-31 | Discharge: 2022-07-31 | Disposition: A | Discharge status: Home / Self Care | Payer: Medicare Other | Source: Ambulatory Visit | Attending: Urology | Admitting: Urology

## 2022-07-31 HISTORY — DX: Chronic obstructive pulmonary disease, unspecified: J44.9

## 2022-07-31 HISTORY — DX: Personal history of other diseases of the circulatory system: Z86.79

## 2022-07-31 HISTORY — DX: Hyperlipidemia, unspecified: E78.5

## 2022-07-31 LAB — CULTURE, URINE COMPREHENSIVE

## 2022-07-31 NOTE — Patient Instructions (Addendum)
Your procedure is scheduled on: Monday 08/07/22 Report to the Registration Desk on the 1st floor of the [facility]. To find out your arrival time, please call [phone] between 1PM - 3PM on: Friday 08/04/22 If your arrival time is 6:00 am, do not arrive before that time as the [facility] entrance doors do not open until 6:00 am.  REMEMBER: Instructions that are not followed completely may result in serious medical risk, up to and including death; or upon the discretion of your surgeon and anesthesiologist your surgery may need to be rescheduled.  Do not eat food or drink any liquids after midnight the night before surgery.  No gum chewing or hard candies.  One week prior to surgery: Stop Anti-inflammatories (NSAIDS) such as Advil, Aleve, Ibuprofen, Motrin, Naproxen, Naprosyn and Aspirin based products such as Excedrin, Goody's Powder, BC Powder. Stop ANY OVER THE COUNTER supplements until after surgery. You may however, continue to take Tylenol if needed for pain up until the day of surgery.  Continue taking all prescribed medications with the exception of the following: PLAVIX (on hold until after surgery)  Follow recommendations from Cardiologist or PCP regarding stopping blood thinners.  TAKE ONLY THESE MEDICATIONS THE MORNING OF SURGERY WITH A SIP OF WATER:  rosuvastatin (CRESTOR) 40 MG tablet  sertraline (ZOLOFT) 100 MG tablet  tamsulosin (FLOMAX) 0.4 MG CAPS capsule   No Alcohol for 24 hours before or after surgery.  No Smoking including e-cigarettes for 24 hours before surgery.  No chewable tobacco products for at least 6 hours before surgery.  No nicotine patches on the day of surgery.  Do not use any "recreational" drugs for at least a week (preferably 2 weeks) before your surgery.  Please be advised that the combination of cocaine and anesthesia may have negative outcomes, up to and including death. If you test positive for cocaine, your surgery will be  cancelled.  On the morning of surgery brush your teeth with toothpaste and water, you may rinse your mouth with mouthwash if you wish. Do not swallow any toothpaste or mouthwash.  Use CHG Soap or wipes as directed on instruction sheet. Shower as usual with your regular soap the morning of surgery.  Do not shave body hair from the neck down 48 hours before surgery.  Do not wear lotions, powders, or perfumes. You can use deodorant.  Do not wear jewelry, make-up, hairpins, clips or nail polish.  Contact lenses, hearing aids and dentures may not be worn into surgery.  Do not bring valuables to the hospital. [facility] is not responsible for any missing/lost belongings or valuables.   Notify your doctor if there is any change in your medical condition (cold, fever, infection).  Wear comfortable clothing (specific to your surgery type) to the hospital.  After surgery, you can help prevent lung complications by doing breathing exercises.  Take deep breaths and cough every 1-2 hours. Your doctor may order a device called an Incentive Spirometer to help you take deep breaths. When coughing or sneezing, hold a pillow firmly against your incision with both hands. This is called "splinting." Doing this helps protect your incision. It also decreases belly discomfort.  If you are being admitted to the hospital overnight, leave your suitcase in the car. After surgery it may be brought to your room.  In case of increased patient census, it may be necessary for you, the patient, to continue your postoperative care in the Same Day Surgery department.  If you are being  discharged the day of surgery, you will not be allowed to drive home. You will need a responsible individual to drive you home and stay with you for 24 hours after surgery.   If you are taking public transportation, you will need to have a responsible individual with you.  Please call the [facility] Dept. at [phone]  if you have any questions about these instructions.  Surgery Visitation Policy:  Patients undergoing a surgery or procedure may have two family members or support persons with them as long as the person is not COVID-19 positive or experiencing its symptoms.   Inpatient Visitation:    Visiting hours are 7 a.m. to 8 p.m. Up to four visitors are allowed at one time in a patient room. The visitors may rotate out with other people during the day. One designated support person (adult) may remain overnight.  Due to an increase in RSV and influenza rates and associated hospitalizations, children ages 21 and under will not be able to visit patients in [facility]. Masks continue to be strongly recommended.

## 2022-08-01 ENCOUNTER — Encounter: Payer: Self-pay | Admitting: Urology

## 2022-08-03 ENCOUNTER — Encounter: Payer: Self-pay | Admitting: Urology

## 2022-08-03 NOTE — Progress Notes (Signed)
Perioperative / Anesthesia Services  Pre-Admission Testing Clinical Review / Preoperative Anesthesia Consult  Date: 08/03/22  Patient Demographics:  Name: William Armstrong DOB:   [birth year] MRN:   [id number]  Planned Surgical Procedure(s):    Case: [id number] Date/Time: 08/07/22 0905   Procedure: CYSTOSCOPY/URETEROSCOPY/HOLMIUM LASER/STENT PLACEMENT (Right)   Anesthesia type: General   Pre-op diagnosis: Right Ureteral Stone   Location: [facility]   Surgeons: Hollice Espy, MD     NOTE: Available PAT nursing documentation and vital signs have been reviewed. Clinical nursing staff has updated patient's PMH/PSHx, current medication list, and drug allergies/intolerances to ensure comprehensive history available to assist in medical decision making as it pertains to the aforementioned surgical procedure and anticipated anesthetic course. Extensive review of available clinical information personally performed. William Armstrong PMH and PSHx updated with any diagnoses/procedures that  may have been inadvertently omitted during his intake with the pre-admission testing department's nursing staff.  Clinical Discussion:  William Armstrong is a 72 y.o. male who is submitted for pre-surgical anesthesia review and clearance prior to him undergoing the above procedure. Patient has never been a smoker. Pertinent PMH includes: CAD, STEMI x 1, NSTEMI x 1, ventricular fibrillation, ischemic cardiomyopathy, CHF, aortic stenosis, pulmonary hypertension, RBBB, bradycardia, angina, HTN, HLD, COPD/CAFL, OSAH (no nocturnal PAP therapy), anemia, nephrolithiasis, BPH, OA, depression.  Patient is followed by cardiology Laurance Flatten, MD). He was last seen in the cardiology clinic on 07/11/2022; notes reviewed.  At the time of his last clinic visit, patient being seen in follow-up following recent ED visit for chest pain.  High-sensitivity troponins peaked at 346 and subsequently down trended with  complete resolution of chest pain.  Patient was not admitted for workup as findings consistent with stable angina.  Since that time, patient reported that he had been doing well.  Patient had been experiencing occasional chest tightness relieved by short acting nitrate therapy.  He denied any shortness of breath, PND, orthopnea, palpitations, significant peripheral edema, vertiginous symptoms, fatigue, or presyncope/syncope. Documented physical exam was grossly benign, providing no evidence of acute exacerbation and/or decompensation of the patient's known cardiovascular conditions.  Patient suffered a STEMI on 02/26/2012.  Diagnostic LEFT heart catheterization was performed revealing a 99% stenosis of the mid RCA.  Subsequent PCI was performed placing a 3.5 x 12 mm and 3.5 x 15 mm Integrity BMS  to the mid RCA yielding excellent angiographic result and TIMI-3 flow.  Procedure complicated when patient developed an AVIR while on the catheterization table.  Rhythm decompensated to ventricular tachycardia requiring defibrillation x 3 (360 J) with appropriate revascularization.  Patient was treated with an amiodarone drip while inpatient. Intervention discontinued prior to discharge.  Patient suffered an NSTEMI on 11/09/2019.  Diagnostic LEFT heart catheterization was performed on 11/10/2019 revealing multivessel CAD; 5% mid RCA, 99% and 15% areas ISR within the previously placed proximal RCA stent, 50% ostial RCA and 100% RPDA.  PCI was subsequently performed placing a 3.5 x 22 mm Resolute Onyx DES to the proximal RCA yielding excellent angiographic result and TIMI-3 flow.  Procedure was uncomplicated per report.  TTE performed on 02/14/2021 revealed a low normal left ventricular systolic function with an EF of 50%.  There was mild concentric LVH.  Inferior hypokinesis noted.  Moderate biatrial and moderate right ventricular enlargement observed.  There was mild to moderate pan valvular regurgitation.  Patient  with trivial aortic valve stenosis with a mean pressure gradient of 10.6 mmHg.  Myocardial perfusion imaging study performed on 06/16/2021 revealing a mildly reduced left ventricular systolic function with an EF of 41%.  SPECT images demonstrated findings consistent with inferior wall infarction with mild peri-infarct ischemia.  Summed severity score was abnormal at 17 with a sum difference score of 3.  Long-term cardiac event monitor study performed on 07/02/2022 revealed a predominant underlying sinus rhythm at a rate of 79 bpm; range 40-120 bpm.  Supraventricular ectopy consisted of 380 isolated PVCs with 3 atrial couplets.  Ventricular ectopy consisted of 4275 isolated PVCs, 187 ventricular couplets, and 2 runs of nonsustained ventricular tachycardia with the longest lasting 4 beats at a rate of 146 bpm.  Patient triggered events corresponded with PVCs.  William Armstrong is scheduled for an CYSTOSCOPY/URETEROSCOPY/HOLMIUM LASER/STENT PLACEMENT (Right) on 08/07/2022 with Dr. Hollice Espy, MD.  Given patient's past medical history significant for cardiovascular diagnoses, presurgical cardiac clearance was sought by the PAT team. Per cardiology, "this patient is optimized for surgery and may proceed with the planned procedural course with an ACCEPTABLE risk of significant perioperative cardiovascular complications".    Again, this patient is on daily DAPT therapy.  He has been instructed on recommendations from his cardiologist for holding his daily clopidogrel dose for 7 days prior to his procedure with plans to restart since postoperatively respectively minimized by his primary attending surgeon.  The patient is aware that his last dose of clopidogrel should be on 07/30/2022.  The patient will continue his daily low-dose ASA throughout his perioperative course.  Patient denies previous perioperative complications with anesthesia in the past. In review of the available records, it is noted that patient  underwent a general anesthetic course here at [facility] (ASA III) in 03/2017 without documented complications.      07/31/2022    3:04 PM 07/07/2022    9:09 AM 07/01/2022    2:26 PM  Vitals with BMI  Height 5' 7.5" 5' 7.5"   Weight 245 lbs 247 lbs   BMI [id number]   Systolic   [id number]  Diastolic   74  Pulse   65    Providers/Specialists:   NOTE: Primary physician provider listed below. Patient may have been seen by APP or partner within same practice.   PROVIDER ROLE / SPECIALTY LAST Lu Duffel, MD Urology (Surgeon) 07/07/2022  Baxter Hire, MD Primary Care Provider 02/07/2022  Elsie Stain, MD Cardiology 07/11/2022   Allergies:  Atorvastatin and Pregabalin  Current Home Medications:   No current facility-administered medications for this encounter.    aspirin EC 81 MG tablet   Cholecalciferol (VITAMIN D) 2000 units CAPS   clopidogrel (PLAVIX) 75 MG tablet   ferrous sulfate 325 (65 FE) MG EC tablet   latanoprost (XALATAN) 0.005 % ophthalmic solution   lisinopril (PRINIVIL,ZESTRIL) 40 MG tablet   nitroGLYCERIN (NITROSTAT) 0.4 MG SL tablet   rosuvastatin (CRESTOR) 40 MG tablet   sertraline (ZOLOFT) 100 MG tablet   tamsulosin (FLOMAX) 0.4 MG CAPS capsule   History:   Past Medical History:  Diagnosis Date   Anemia    Angina pectoris (HCC)    Aortic stenosis 02/14/2021   a.) TTE 02/14/2021: trivial AS (MPG 10.6)   Arthritis    BCC (basal cell carcinoma of skin)    BPH (benign prostatic hyperplasia)    Bradycardia    CAFL (chronic airflow limitation) (HCC)    CHF (congestive heart failure) ([place])    a.) TTE 02/26/2012: EF 50%, mod LVH,  post HHK, RVE, triv AR/MR, mild TR/PR, PASP 44; b.) TTE 10/11/2012: EF >55%, interm paradoxical sep motion, PASP 45; c.) TTE 03/18/2015: EF 40%, inf/post HK, mild panval regurg, PASP 40-45, G1DD; d.) TTE 02/14/2021: EF 50%, LVH, inf/inferosep HK, mod BAE, mod RVE, mild AR/TR, mod MR/PR, triv AS  (MPG 10.6)   COPD (chronic obstructive pulmonary disease) (HCC)    Coronary artery disease    a.) LHC/PCI 02/26/2012: 99% mRCA (3.5 x 15 mm and 2.5 x 12 mm Integrity BMS); b.) LHC/PCI 10/11/2021: 99% oRCA, 80% ISR pRCA --> faint L-R collateral formation --> DES x 2 (unknown type) placed   Depression    Glaucoma    H/O ventricular fibrillation 02/26/2012   a.) in setting of STEMI --> required defibrillation (360 J) x 3 + amiodarone gtt   History of kidney stones    HLD (hyperlipidemia)    HTN (hypertension)    Hypertension    Hypokalemia    Ischemic cardiomyopathy    a.) TTE 02/26/2012: EF 50%; b.) TTE 10/11/2012: EF >55%; c.) TTE 03/18/2015: EF 40%; d.) TTE 02/14/2021: EF 50%   Long term current use of antithrombotics/antiplatelets    a.) DAPT (ASA + clopidogrel)   Morbid obesity ([place])    NSTEMI (non-ST elevated myocardial infarction) ([place]) 11/09/2019   a.) LHC/PCI 11/10/2019: 5% mRCA, 99% ISR pRCA-1 (3.5 x 22 mm Resolute Onxy DES), 15% pRCA-2, 50% oRCA, 100% RPDA.   OSA (obstructive sleep apnea)    a.) no longer requires nocturnal s/p ~250 lb weight loss following bariatric surgery   Pulmonary HTN ([place]) 02/26/2012   a.) TTE 02/26/2012: PASP 44; b.) TTE 10/11/2012: PASP 45; c.) TTE 03/18/2015: PASP 40-45   RBBB (right bundle branch block)    STEMI (ST elevation myocardial infarction) ([place]) 02/26/2012   a.) LHC/PCI 02/26/2012 --> 99% mRCA (3.5 x 15 mm and 3.5 x 12 mm Integrity BMS); b.) developed AVIR on catheterization table --> decompsensated to VT requiring defibrillation x 3, which achieved ROSC. Treated with amiodarone gtt.   Past Surgical History:  Procedure Laterality Date   CARDIAC CATHETERIZATION     CORONARY ANGIOPLASTY     CORONARY STENT INTERVENTION N/A 11/10/2019   Procedure: CORONARY STENT INTERVENTION;  Surgeon: Isaias Cowman, MD;  Location: [facility];  Service: Cardiovascular;  Laterality: N/A;   CORONARY STENT PLACEMENT  02/2012   CYSTOSCOPY W/  RETROGRADES Left 11/18/2016   Procedure: CYSTOSCOPY WITH RETROGRADE PYELOGRAM;  Surgeon: Irine Seal, MD;  Location: [facility];  Service: Urology;  Laterality: Left;   CYSTOSCOPY WITH STENT PLACEMENT Left 11/18/2016   Procedure: CYSTOSCOPY WITH STENT PLACEMENT;  Surgeon: Irine Seal, MD;  Location: [facility];  Service: Urology;  Laterality: Left;   CYSTOSCOPY/URETEROSCOPY/HOLMIUM LASER/STENT PLACEMENT Left 11/29/2016   Procedure: CYSTOSCOPY/URETEROSCOPY/HOLMIUM LASER/STENT EXCHANGE;  Surgeon: Hollice Espy, MD;  Location: [facility];  Service: Urology;  Laterality: Left;   CYSTOSCOPY/URETEROSCOPY/HOLMIUM LASER/STENT PLACEMENT Right 03/07/2017   Procedure: CYSTOSCOPY/URETEROSCOPY/HOLMIUM LASER/STENT PLACEMENT;  Surgeon: Hollice Espy, MD;  Location: [facility];  Service: Urology;  Laterality: Right;   EXTRACORPOREAL SHOCK WAVE LITHOTRIPSY Left 02/03/2021   Procedure: EXTRACORPOREAL SHOCK WAVE LITHOTRIPSY (ESWL);  Surgeon: Hollice Espy, MD;  Location: [facility];  Service: Urology;  Laterality: Left;   EXTRACORPOREAL SHOCK WAVE LITHOTRIPSY Right 06/22/2022   Procedure: EXTRACORPOREAL SHOCK WAVE LITHOTRIPSY (ESWL);  Surgeon: Abbie Sons, MD;  Location: [facility];  Service: Urology;  Laterality: Right;   JOINT REPLACEMENT     right hip   LAPAROSCOPIC GASTRIC RESTRICTIVE DUODENAL PROCEDURE (  DUODENAL SWITCH)     w/hiatal hernia repair   LAPAROSCOPIC GASTRIC SLEEVE RESECTION  08/21/2013   LEFT HEART CATH AND CORONARY ANGIOGRAPHY N/A 11/10/2019   Procedure: LEFT HEART CATH AND CORONARY ANGIOGRAPHY;  Surgeon: Teodoro Spray, MD;  Location: [facility];  Service: Cardiovascular;  Laterality: N/A;   skin removal surgery     removal of extra skin approx 20 lbs   STONE EXTRACTION WITH BASKET Right 03/07/2017   Procedure: STONE EXTRACTION WITH BASKET;  Surgeon: Hollice Espy, MD;  Location: [facility];  Service: Urology;  Laterality: Right;   TONSILLECTOMY AND ADENOIDECTOMY  1960   TOTAL HIP  ARTHROPLASTY Right 01/18/2017   Procedure: TOTAL HIP ARTHROPLASTY ANTERIOR APPROACH;  Surgeon: Hessie Knows, MD;  Location: [facility];  Service: Orthopedics;  Laterality: Right;   Family History  Problem Relation Age of Onset   Heart attack Mother    Brain cancer Father    Heart attack Sister    Congenital heart disease Sister    Leukemia Paternal Grandmother    COPD Brother    Heart disease Brother    Prostate cancer Neg Hx    Kidney cancer Neg Hx    Bladder Cancer Neg Hx    Social History   Tobacco Use   Smoking status: Never    Passive exposure: Never   Smokeless tobacco: Never  Vaping Use   Vaping Use: Never used  Substance Use Topics   Alcohol use: No    Alcohol/week: 0.0 standard drinks of alcohol   Drug use: No    Pertinent Clinical Results:  LABS:     Ref Range & Units 07/31/2022  WBC (White Blood Cell Count) 4.1 - 10.2 10^3/uL 5.1  RBC (Red Blood Cell Count) 4.69 - 6.13 10^6/uL 4.63 Low   Hemoglobin 14.1 - 18.1 gm/dL 12.8 Low   Hematocrit 40.0 - 52.0 % 40.8  MCV (Mean Corpuscular Volume) 80.0 - 100.0 fl 88.1  MCH (Mean Corpuscular Hemoglobin) 27.0 - 31.2 pg 27.6  MCHC (Mean Corpuscular Hemoglobin Concentration) 32.0 - 36.0 gm/dL 31.4 Low   Platelet Count 150 - 450 10^3/uL 211  RDW-CV (Red Cell Distribution Width) 11.6 - 14.8 % 15.2 High   MPV (Mean Platelet Volume) 9.4 - 12.4 fl 9.9  Neutrophils 1.50 - 7.80 10^3/uL 3.00  Lymphocytes 1.00 - 3.60 10^3/uL 1.55  Monocytes 0.00 - 1.50 10^3/uL 0.40  Eosinophils 0.00 - 0.55 10^3/uL 0.12  Basophils 0.00 - 0.09 10^3/uL 0.03  Neutrophil % 32.0 - 70.0 % 58.8  Lymphocyte % 10.0 - 50.0 % 30.3  Monocyte % 4.0 - 13.0 % 7.8  Eosinophil % 1.0 - 5.0 % 2.3  Basophil% 0.0 - 2.0 % 0.6  Immature Granulocyte % <=0.7 % 0.2  Immature Granulocyte Count <=0.06 10^3/L 0.01  Resulting Agency  [facility]  Specimen Collected: 07/31/22 08:24   Performed by: [facility] - [facility] Last Resulted: 07/31/22 10:58   Received From: [facility]  Result Received: 07/31/22 14:55    Ref Range & Units 07/31/2022  Glucose 70 - 110 mg/dL 84  Sodium 136 - 145 mmol/L 140  Potassium 3.6 - 5.1 mmol/L 4.1  Chloride 97 - 109 mmol/L 107  Carbon Dioxide (CO2) 22.0 - 32.0 mmol/L 28.3  Urea Nitrogen (BUN) 7 - 25 mg/dL 24  Creatinine 0.7 - 1.3 mg/dL 1.3  Glomerular Filtration Rate (eGFR) >60 mL/min/1.73sq m 58 Low   Calcium 8.7 - 10.3 mg/dL 8.6 Low   AST 8 -  39 U/L 88 High   ALT 6 - 57 U/L 67 High   Alk Phos (alkaline Phosphatase) 34 - 104 U/L 126 High   Albumin 3.5 - 4.8 g/dL 3.7  Bilirubin, Total 0.3 - 1.2 mg/dL 0.6  Protein, Total 6.1 - 7.9 g/dL 6.2  A/G Ratio 1.0 - 5.0 gm/dL 1.5  Resulting Agency  [facility] - [facility]  Specimen Collected: 07/31/22 08:24   Performed by: [place]: 07/31/22 10:52  Received From: [facility]  Result Received: 07/31/22 14:55   Component Date Value Ref Range Status   Urine Culture, Comprehensive 07/27/2022 Final report   Final   Organism ID, Bacteria 07/27/2022 Comment   Final   Comment: Mixed urogenital flora 7,000 Colonies/mL    Organism ID, Bacteria [id number] Not applicable   Final   Specific Gravity, UA 07/27/2022 1.020  1.005 - 1.030 Final   pH, UA 07/27/2022 5.0  5.0 - 7.5 Final   Color, UA 07/27/2022 Yellow  Yellow Final   Appearance Ur 07/27/2022 Clear  Clear Final   Leukocytes,UA 07/27/2022 Negative  Negative Final   Protein,UA 07/27/2022 1+ (A)  Negative/Trace Final   Glucose, UA 07/27/2022 Negative  Negative Final   Ketones, UA 07/27/2022 Negative  Negative Final   RBC, UA 07/27/2022 Trace (A)  Negative Final   Bilirubin, UA 07/27/2022 Negative  Negative Final   Urobilinogen, Ur 07/27/2022 0.2  0.2 - 1.0 mg/dL Final   Nitrite, UA 07/27/2022 Negative  Negative Final   Microscopic Examination 07/27/2022 See below:   Final   WBC, UA 07/27/2022 0-5  0 - 5 /hpf Final   RBC, Urine 07/27/2022  0-2  0 - 2 /hpf Final   Epithelial Cells (non renal) 07/27/2022 0-10  0 - 10 /hpf Final   Casts 07/27/2022 Present (A)  None seen /lpf Final   Cast Type 07/27/2022 Hyaline casts  N/A Final   Crystals 07/27/2022 Present (A)  N/A Final   Crystal Type 07/27/2022 Calcium Oxalate  N/A Final   Mucus, UA 07/27/2022 Present (A)  Not Estab. Final   Bacteria, UA 07/27/2022 Few  None seen/Few Final    ECG: Date: 07/31/2022 Time ECG obtained: 0744 AM Rate: 73 bpm Rhythm:  Normal sinus rhythm; RBBB Axis (leads I and aVF): Normal Intervals: PR 142 ms. QRS 188 ms. QTc 506 ms. ST segment and T wave changes: No evidence of acute ST segment elevation or depression Comparison: Similar to previous tracing obtained on 06/30/2022   IMAGING / PROCEDURES: LONG TERM CARDIAC EVENT MONITOR STUDY performed on 07/02/2022 Predominant underlying sinus rhythm with an average rate of 79 bpm; range 40-120 bpm Atrial ectopy noted; 380 isolated PACs and 3 atrial couplets Ventricular ectopy noted; 40-75 isolated PVCs, 187 ventricular couplets, and 2 runs of NSVT with the longest/fastest lasting 4 beats at a rate of 146 bpm Patient triggered events correspond with dyspnea and lightheadedness and found to be associated with PVCs  MYOCARDIAL PERFUSION IMAGING STUDY (LEXISCAN) performed on 06/16/2021 Slightly reduced left ventricular systolic function with an EF of 41% Mild global hypokinesis with moderate hypokinesis in the inferior wall GI uptake noted Left ventricular cavity size enlarged SPECT images demonstrated a moderate zone of moderately reduced activity in the inferior wall extending from the base to the apical segments.  Findings most consistent with an inferior wall infarction and mild peri-infarct ischemia Summed severity score abnormal at 17 with a sum difference score of 3  TRANSTHORACIC ECHOCARDIOGRAM performed on  02/14/2021 Low normal left ventricular systolic function with an EF of 50% Mild concentric  LVH Inferior hypokinesis Moderate biatrial enlargement Moderate right ventricular enlargement Mild AR and TR moderate MR and PR Trivial aortic valve stenosis with a mean pressure gradient of 10.6 mmHg  LEFT HEART CATHETERIZATION AND CORONARY ANGIOGRAPHY performed on 11/10/2019 Mildly reduced left ventricular systolic function with an EF of 45-50% Normal LVEDP No aortic or mitral valve stenosis Multivessel CAD; ISR of previously placed proximal RCA stent noted 5% mid RCA 99% proximal RCA-1 15% proximal RCA-2 50% ostial RCA Successful PCI 3.5 x 22 mm Resolute Onyx DES placed to the proximal RCA Recommendations Uninterrupted dual antiplatelet therapy for 1 year Aggressive risk factor modification Enroll in cardiac rehabilitation program Outpatient follow-up in 1 week   Impression and Plan:  William Armstrong has been referred for pre-anesthesia review and clearance prior to him undergoing the planned anesthetic and procedural courses. Available labs, pertinent testing, and imaging results were personally reviewed by me in preparation for upcoming operative/procedural course. [facility] medical record has been updated following extensive record review and patient interview with PAT staff.   This patient has been appropriately cleared by cardiology with an overall ACCEPTABLE risk of significant perioperative cardiovascular complications. Based on clinical review performed today (08/03/22), barring any significant acute changes in the patient's overall condition, it is anticipated that he will be able to proceed with the planned surgical intervention. Any acute changes in clinical condition may necessitate his procedure being postponed and/or cancelled. Patient will meet with anesthesia team (MD and/or CRNA) on the day of his procedure for preoperative evaluation/assessment. Questions regarding anesthetic course will be fielded at that time.   Pre-surgical instructions were reviewed with the  patient during his PAT appointment, and questions were fielded to satisfaction by PAT clinical staff. He has been instructed on which medications that he will need to hold prior to surgery, as well as the ones that have been deemed safe/appropriate to take of the day of his procedure. As part of the general education provided by PAT, patient made aware both verbally and in writing, that he would need to abstain from the use of any illegal substances during his perioperative course.  He was advised that failure to follow the provided instructions could necessitate case cancellation or result serious perioperative complications up to and including death. Patient encouraged to contact PAT and/or his surgeon's office to discuss any questions or concerns that may arise prior to surgery; verbalized understanding.   Honor Loh, MSN, APRN, FNP-C, CEN [facility]  Peri-operative Services Nurse Practitioner Phone: [phone] Fax: [phone] 08/03/22 3:40 PM  NOTE: This note has been prepared using Dragon dictation software. Despite my best ability to proofread, there is always the potential that unintentional transcriptional errors may still occur from this process.

## 2022-08-06 MED ORDER — CHLORHEXIDINE GLUCONATE 0.12 % MT SOLN: 15.0000 mL | Freq: Once | OROMUCOSAL | Status: AC

## 2022-08-06 MED ORDER — FAMOTIDINE 20 MG PO TABS: 20.0000 mg | ORAL_TABLET | Freq: Once | ORAL | Status: AC

## 2022-08-06 MED ORDER — ORAL CARE MOUTH RINSE: 15.0000 mL | Freq: Once | OROMUCOSAL | Status: AC

## 2022-08-06 MED ORDER — LACTATED RINGERS IV SOLN: INTRAVENOUS | Status: DC

## 2022-08-06 MED ORDER — CEFAZOLIN SODIUM-DEXTROSE 2-4 GM/100ML-% IV SOLN
2.0000 g | INTRAVENOUS | Status: AC
  Administered 2022-08-07: 2 g via INTRAVENOUS

## 2022-08-07 ENCOUNTER — Other Ambulatory Visit: Payer: Self-pay

## 2022-08-07 ENCOUNTER — Ambulatory Visit: Payer: Medicare Other | Admitting: Urgent Care

## 2022-08-07 ENCOUNTER — Encounter
Admission: RE | Disposition: A | Discharge status: Home / Self Care | Payer: Self-pay | Source: Home / Self Care | Attending: Urology

## 2022-08-07 ENCOUNTER — Ambulatory Visit: Payer: Medicare Other

## 2022-08-07 ENCOUNTER — Ambulatory Visit
Admission: RE | Admit: 2022-08-07 | Discharge: 2022-08-07 | Disposition: A | Discharge status: Home / Self Care | Payer: Medicare Other | Attending: Urology | Admitting: Urology

## 2022-08-07 ENCOUNTER — Encounter: Payer: Self-pay | Admitting: Urology

## 2022-08-07 DIAGNOSIS — Z955 Presence of coronary angioplasty implant and graft: Secondary | ICD-10-CM | POA: Insufficient documentation

## 2022-08-07 DIAGNOSIS — I509 Heart failure, unspecified: Secondary | ICD-10-CM | POA: Diagnosis not present

## 2022-08-07 DIAGNOSIS — Z7982 Long term (current) use of aspirin: Secondary | ICD-10-CM | POA: Diagnosis not present

## 2022-08-07 DIAGNOSIS — Z85828 Personal history of other malignant neoplasm of skin: Secondary | ICD-10-CM | POA: Diagnosis not present

## 2022-08-07 DIAGNOSIS — I272 Pulmonary hypertension, unspecified: Secondary | ICD-10-CM | POA: Insufficient documentation

## 2022-08-07 DIAGNOSIS — Z7902 Long term (current) use of antithrombotics/antiplatelets: Secondary | ICD-10-CM | POA: Insufficient documentation

## 2022-08-07 DIAGNOSIS — N1330 Unspecified hydronephrosis: Secondary | ICD-10-CM

## 2022-08-07 DIAGNOSIS — N202 Calculus of kidney with calculus of ureter: Secondary | ICD-10-CM | POA: Diagnosis present

## 2022-08-07 DIAGNOSIS — I2510 Atherosclerotic heart disease of native coronary artery without angina pectoris: Secondary | ICD-10-CM | POA: Insufficient documentation

## 2022-08-07 DIAGNOSIS — J449 Chronic obstructive pulmonary disease, unspecified: Secondary | ICD-10-CM | POA: Insufficient documentation

## 2022-08-07 DIAGNOSIS — I252 Old myocardial infarction: Secondary | ICD-10-CM | POA: Diagnosis not present

## 2022-08-07 DIAGNOSIS — I110 Hypertensive heart disease with heart failure: Secondary | ICD-10-CM | POA: Diagnosis not present

## 2022-08-07 DIAGNOSIS — N201 Calculus of ureter: Secondary | ICD-10-CM

## 2022-08-07 HISTORY — DX: Bradycardia, unspecified: R00.1

## 2022-08-07 HISTORY — DX: Obstructive sleep apnea (adult) (pediatric): G47.33

## 2022-08-07 HISTORY — DX: Ischemic cardiomyopathy: I25.5

## 2022-08-07 HISTORY — DX: Unspecified right bundle-branch block: I45.10

## 2022-08-07 HISTORY — DX: Angina pectoris, unspecified: I20.9

## 2022-08-07 HISTORY — DX: Benign prostatic hyperplasia without lower urinary tract symptoms: N40.0

## 2022-08-07 HISTORY — PX: CYSTOSCOPY/URETEROSCOPY/HOLMIUM LASER/STENT PLACEMENT: SHX6546

## 2022-08-07 HISTORY — DX: Long term (current) use of antithrombotics/antiplatelets: Z79.02

## 2022-08-07 HISTORY — DX: Morbid (severe) obesity due to excess calories: E66.01

## 2022-08-07 SURGERY — CYSTOSCOPY/URETEROSCOPY/HOLMIUM LASER/STENT PLACEMENT
Anesthesia: General | Site: Ureter | Laterality: Right

## 2022-08-07 MED ORDER — LIDOCAINE HCL (CARDIAC) PF 100 MG/5ML IV SOSY
PREFILLED_SYRINGE | INTRAVENOUS | Status: DC | PRN
  Administered 2022-08-07: 100 mg via INTRAVENOUS

## 2022-08-07 MED ORDER — EPHEDRINE SULFATE (PRESSORS) 50 MG/ML IJ SOLN
INTRAMUSCULAR | Status: DC | PRN
  Administered 2022-08-07: 10 mg via INTRAVENOUS

## 2022-08-07 MED ORDER — ROCURONIUM BROMIDE 100 MG/10ML IV SOLN
INTRAVENOUS | Status: DC | PRN
  Administered 2022-08-07: 40 mg via INTRAVENOUS
  Administered 2022-08-07: 10 mg via INTRAVENOUS

## 2022-08-07 MED ORDER — HYDROCODONE-ACETAMINOPHEN 5-325 MG PO TABS: 1.0000 | ORAL_TABLET | Freq: Four times a day (QID) | ORAL | 0 refills | Status: DC | PRN

## 2022-08-07 MED ORDER — IOHEXOL 180 MG/ML  SOLN
INTRAMUSCULAR | Status: DC | PRN
  Administered 2022-08-07: 10 mL

## 2022-08-07 MED ORDER — DEXAMETHASONE SODIUM PHOSPHATE 10 MG/ML IJ SOLN
INTRAMUSCULAR | Status: DC | PRN
  Administered 2022-08-07: 10 mg via INTRAVENOUS

## 2022-08-07 MED ORDER — FENTANYL CITRATE (PF) 100 MCG/2ML IJ SOLN
INTRAMUSCULAR | Status: DC | PRN
  Administered 2022-08-07 (×2): 50 ug via INTRAVENOUS

## 2022-08-07 MED ORDER — FENTANYL CITRATE (PF) 100 MCG/2ML IJ SOLN: 25.0000 ug | INTRAMUSCULAR | Status: DC | PRN

## 2022-08-07 MED ORDER — ACETAMINOPHEN 10 MG/ML IV SOLN
INTRAVENOUS | Status: DC | PRN
  Administered 2022-08-07: 1000 mg via INTRAVENOUS

## 2022-08-07 MED ORDER — CHLORHEXIDINE GLUCONATE 0.12 % MT SOLN
OROMUCOSAL | Status: AC
  Administered 2022-08-07: 15 mL via OROMUCOSAL
  Filled 2022-08-07: qty 15

## 2022-08-07 MED ORDER — OXYBUTYNIN CHLORIDE 5 MG PO TABS: 5.0000 mg | ORAL_TABLET | Freq: Three times a day (TID) | ORAL | 0 refills | Status: DC | PRN

## 2022-08-07 MED ORDER — SUGAMMADEX SODIUM 500 MG/5ML IV SOLN
INTRAVENOUS | Status: DC | PRN
  Administered 2022-08-07: 400 mg via INTRAVENOUS

## 2022-08-07 MED ORDER — SUCCINYLCHOLINE CHLORIDE 200 MG/10ML IV SOSY
PREFILLED_SYRINGE | INTRAVENOUS | Status: DC | PRN
  Administered 2022-08-07: 120 mg via INTRAVENOUS

## 2022-08-07 MED ORDER — GLYCOPYRROLATE 0.2 MG/ML IJ SOLN
INTRAMUSCULAR | Status: DC | PRN
  Administered 2022-08-07: .2 mg via INTRAVENOUS

## 2022-08-07 MED ORDER — CEFAZOLIN SODIUM-DEXTROSE 2-4 GM/100ML-% IV SOLN
INTRAVENOUS | Status: AC
  Filled 2022-08-07: qty 100

## 2022-08-07 MED ORDER — SODIUM CHLORIDE 0.9 % IR SOLN
Status: DC | PRN
  Administered 2022-08-07: 2500 mL

## 2022-08-07 MED ORDER — ONDANSETRON HCL 4 MG/2ML IJ SOLN
INTRAMUSCULAR | Status: DC | PRN
  Administered 2022-08-07 (×2): 4 mg via INTRAVENOUS

## 2022-08-07 MED ORDER — LIDOCAINE HCL URETHRAL/MUCOSAL 2 % EX GEL
CUTANEOUS | Status: AC
  Filled 2022-08-07: qty 10

## 2022-08-07 MED ORDER — PHENYLEPHRINE 80 MCG/ML (10ML) SYRINGE FOR IV PUSH (FOR BLOOD PRESSURE SUPPORT)
PREFILLED_SYRINGE | INTRAVENOUS | Status: DC | PRN
  Administered 2022-08-07: 160 ug via INTRAVENOUS

## 2022-08-07 MED ORDER — TAMSULOSIN HCL 0.4 MG PO CAPS: 0.4000 mg | ORAL_CAPSULE | Freq: Every day | ORAL | 0 refills | Status: DC

## 2022-08-07 MED ORDER — FAMOTIDINE 20 MG PO TABS
ORAL_TABLET | ORAL | Status: AC
  Administered 2022-08-07: 20 mg via ORAL
  Filled 2022-08-07: qty 1

## 2022-08-07 MED ORDER — PROPOFOL 10 MG/ML IV BOLUS
INTRAVENOUS | Status: DC | PRN
  Administered 2022-08-07: 200 mg via INTRAVENOUS

## 2022-08-07 MED ORDER — FENTANYL CITRATE (PF) 100 MCG/2ML IJ SOLN
INTRAMUSCULAR | Status: AC
  Filled 2022-08-07: qty 2

## 2022-08-07 SURGICAL SUPPLY — 23 items
BAG DRAIN SIEMENS DORNER NS (MISCELLANEOUS) ×1 IMPLANT
BAG DRN NS LF (MISCELLANEOUS) ×1
BASKET ZERO TIP 1.9FR (BASKET) IMPLANT
BRUSH SCRUB EZ 1% IODOPHOR (MISCELLANEOUS) ×1 IMPLANT
BSKT STON RTRVL ZERO TP 1.9FR (BASKET) ×1
CATH URETL OPEN 5X70 (CATHETERS) ×1 IMPLANT
CNTNR URN SCR LID CUP LEK RST (MISCELLANEOUS) IMPLANT
CONT SPEC 4OZ STRL OR WHT (MISCELLANEOUS)
DRAPE UTILITY 15X26 TOWEL STRL (DRAPES) ×1 IMPLANT
FIBER LASER MOSES 200 DFL (Laser) IMPLANT
GLOVE BIO SURGEON STRL SZ 6.5 (GLOVE) ×1 IMPLANT
GOWN STRL REUS W/ TWL LRG LVL3 (GOWN DISPOSABLE) ×2 IMPLANT
GOWN STRL REUS W/TWL LRG LVL3 (GOWN DISPOSABLE) ×2
GUIDEWIRE ANG ZIPWIRE 035X150 (WIRE) IMPLANT
GUIDEWIRE STR DUAL SENSOR (WIRE) ×1 IMPLANT
IV NS IRRIG 3000ML ARTHROMATIC (IV SOLUTION) ×1 IMPLANT
KIT TURNOVER CYSTO (KITS) ×1 IMPLANT
PACK CYSTO AR (MISCELLANEOUS) ×1 IMPLANT
SET CYSTO W/LG BORE CLAMP LF (SET/KITS/TRAYS/PACK) ×1 IMPLANT
STENT URET 6FRX26 CONTOUR (STENTS) IMPLANT
SURGILUBE 2OZ TUBE FLIPTOP (MISCELLANEOUS) ×1 IMPLANT
TRAP FLUID SMOKE EVACUATOR (MISCELLANEOUS) ×1 IMPLANT
WATER STERILE IRR 500ML POUR (IV SOLUTION) ×1 IMPLANT

## 2022-08-07 NOTE — Anesthesia Preprocedure Evaluation (Signed)
Anesthesia Evaluation  Patient identified by MRN, date of birth, ID band Patient awake    Reviewed: Allergy & Precautions, NPO status , Patient's Chart, lab work & pertinent test results  History of Anesthesia Complications Negative for: history of anesthetic complications  Airway Mallampati: II  TM Distance: >3 FB Neck ROM: Full    Dental no notable dental hx.    Pulmonary neg shortness of breath, sleep apnea (had repeat study after weight loss and no longer needs CPAP) , COPD, neg recent URI   breath sounds clear to auscultation- rhonchi (-) wheezing      Cardiovascular Exercise Tolerance: Good hypertension, (-) angina + CAD, + Past MI, + Cardiac Stents (2013 RCA) and +CHF (EF 40%)   Rhythm:Regular Rate:Normal - Systolic murmurs and - Diastolic murmurs NM perfusion 03/18/15:  Myocardial perfusion imaging is Abnormal- mild moderate infarction  inferior minimal peri-infarct ischemia at the base..  Summed severity score is normal.  Artifacts noted:see above.  Overall left ventricular systolic function was Abnormal- EF 47% with  regional wall motion abnormalities (see above).  Compared to the prior study from no prior study.  Wall motion correlates with recent echo and know prior IMI and PCI to  RCA  Echo 03/18/15: Mildly concentrically hypertrophied and midly dilated left ventricle with focal hypokinesis of the inferior and inferior lateral walls. Other walls contract normally. EF mildly reduced at 40%. Mild diastolic dysfunction is present. Moderate left atrial enlargement. Thickened aortic valve with trivial stenosis. Mild regurgitation of all 4 heart valves. Mild pulmonary hypertension, with estimated systolic pressure of [id number]. Mildly dilated ascending aorta.   Neuro/Psych  PSYCHIATRIC DISORDERS  Depression    negative neurological ROS     GI/Hepatic negative GI ROS, Neg liver ROS,,,  Endo/Other   negative endocrine ROSneg diabetes    Renal/GU negative Renal ROS     Musculoskeletal  (+) Arthritis ,    Abdominal  (+) + obese  Peds  Hematology  (+) Blood dyscrasia, anemia   Anesthesia Other Findings Past Medical History: No date: Anemia No date: Arthritis No date: BCC (basal cell carcinoma of skin) No date: CHF (congestive heart failure) (HCC) No date: Complication of anesthesia     Comment:  neck hurt for 2-3 days after general anesthesia No date: COPD (chronic obstructive pulmonary disease) (HCC) No date: Coronary artery disease No date: Depression No date: Glaucoma No date: Heart attack [facility])     Comment:  2013 No date: History of kidney stones No date: HTN (hypertension) No date: Hyperglycemia     Comment:  pt denies No date: Hypertension No date: Hypokalemia No date: Obesity     Comment:  morbid No date: Sleep apnea     Comment:  osa, CPAP has been dc'd after 249 lb weight loss   Reproductive/Obstetrics                             Anesthesia Physical Anesthesia Plan  ASA: 3  Anesthesia Plan: General   Post-op Pain Management:    Induction: Intravenous  PONV Risk Score and Plan: 1 and Ondansetron, Dexamethasone and Treatment may vary due to age or medical condition  Airway Management Planned: Oral ETT and LMA  Additional Equipment:   Intra-op Plan:   Post-operative Plan: Extubation in OR  Informed Consent: I have reviewed the patients History and Physical, chart, labs and discussed the procedure including the risks, benefits and alternatives for the proposed anesthesia with the  patient or authorized representative who has indicated his/her understanding and acceptance.     Dental advisory given  Plan Discussed with: CRNA and Anesthesiologist  Anesthesia Plan Comments:         Anesthesia Quick Evaluation

## 2022-08-07 NOTE — Discharge Instructions (Addendum)
You have a ureteral stent in place.  This is a tube that extends from your kidney to your bladder.  This may cause urinary bleeding, burning with urination, and urinary frequency.  Please call our office or present to the ED if you develop fevers >101 or pain which is not able to be controlled with oral pain medications.  You may be given either Flomax and/ or ditropan to help with bladder spasms and stent pain in addition to pain medications.    [facility] [address], [place], [place] [address] [phone]  AMBULATORY SURGERY  DISCHARGE INSTRUCTIONS   The drugs that you were given will stay in your system until tomorrow so for the next 24 hours you should not:  Drive an automobile Make any legal decisions Drink any alcoholic beverage   You may resume regular meals tomorrow.  Today it is better to start with liquids and gradually work up to solid foods.  You may eat anything you prefer, but it is better to start with liquids, then soup and crackers, and gradually work up to solid foods.   Please notify your doctor immediately if you have any unusual bleeding, trouble breathing, redness and pain at the surgery site, drainage, fever, or pain not relieved by medication.    Additional Instructions:   Please contact your physician with any problems or Same Day Surgery at [phone], Monday through Friday 6 am to 4 pm, or [place] [facility] number at [phone].

## 2022-08-07 NOTE — H&P (Signed)
08/07/22 RRR CTAB  Risks and benefits of ureteroscopy were reviewed including but not limited to infection, bleeding, pain, ureteral injury which could require open surgery versus prolonged indwelling if ureteral perforation occurs, persistent stone disease, requirement for staged procedure, possible stent, and global anesthesia risks. Patient expressed understanding and desires to proceed with ureteroscopy.    Location: Patient: William Armstrong Provider: Dr. Hollice Espy   I discussed the limitations of evaluation and management by telemedicine and the availability of in person appointments. The patient expressed understanding and agreed to proceed.   History of Present Illness:    72 year-old male who is seen today via virtual visit for a further discussion regarding his right kidney stone.    He indicates that he had a lithotripsy procedure scheduled, but it could not be carried out due to an irregular heart rate and chest discomfort. He was given a Valium prior to the procedure and he reports that his heart rate dropped to below 40 bpm. Consequently, he was admitted to the hospital for further observation and treatment.  He did have a slight bump in his troponins but is felt to likely represent unstable angina did not meet criteria for NSTEMI.  He continues on maximal antiplatelet therapy in the form of aspirin and Plavix.  He has not yet scheduled a follow-up appointment with his cardiologist, but intends to do so after this current appointment.    Additionally, he mentions that he passed a kidney stone shortly after his discharge from the hospital.  This was punctate and he showed me today a very small stone in a plastic baggy.  He reports after passing this punctate stone his pain and bleeding resolved.   He denies any pain from the remaining kidney stone at this time and no other urinary symptoms.    Objective/Objective   Patient appears well.   Past Medical History:  Diagnosis  Date   Anemia    Angina pectoris ([place])    Aortic stenosis 02/14/2021   a.) TTE 02/14/2021: trivial AS (MPG 10.6)   Arthritis    BCC (basal cell carcinoma of skin)    BPH (benign prostatic hyperplasia)    Bradycardia    CAFL (chronic airflow limitation) (HCC)    CHF (congestive heart failure) ([place])    a.) TTE 02/26/2012: EF 50%, mod LVH, post HHK, RVE, triv AR/MR, mild TR/PR, PASP 44; b.) TTE 10/11/2012: EF >55%, interm paradoxical sep motion, PASP 45; c.) TTE 03/18/2015: EF 40%, inf/post HK, mild panval regurg, PASP 40-45, G1DD; d.) TTE 02/14/2021: EF 50%, LVH, inf/inferosep HK, mod BAE, mod RVE, mild AR/TR, mod MR/PR, triv AS (MPG 10.6)   COPD (chronic obstructive pulmonary disease) (HCC)    Coronary artery disease    a.) LHC/PCI 02/26/2012: 99% mRCA (3.5 x 15 mm and 2.5 x 12 mm Integrity BMS); b.) LHC/PCI 10/11/2021: 99% oRCA, 80% ISR pRCA --> faint L-R collateral formation --> DES x 2 (unknown type) placed   Depression    Glaucoma    H/O ventricular fibrillation 02/26/2012   a.) in setting of STEMI --> required defibrillation (360 J) x 3 + amiodarone gtt   History of kidney stones    HLD (hyperlipidemia)    HTN (hypertension)    Hypertension    Hypokalemia    Ischemic cardiomyopathy    a.) TTE 02/26/2012: EF 50%; b.) TTE 10/11/2012: EF >55%; c.) TTE 03/18/2015: EF 40%; d.) TTE 02/14/2021: EF 50%   Long term current use of antithrombotics/antiplatelets  a.) DAPT (ASA + clopidogrel)   Morbid obesity (HCC)    NSTEMI (non-ST elevated myocardial infarction) ([place]) 11/09/2019   a.) LHC/PCI 11/10/2019: 5% mRCA, 99% ISR pRCA-1 (3.5 x 22 mm Resolute Onxy DES), 15% pRCA-2, 50% oRCA, 100% RPDA.   OSA (obstructive sleep apnea)    a.) no longer requires nocturnal s/p ~250 lb weight loss following bariatric surgery   Pulmonary HTN ([place]) 02/26/2012   a.) TTE 02/26/2012: PASP 44; b.) TTE 10/11/2012: PASP 45; c.) TTE 03/18/2015: PASP 40-45   RBBB (right bundle branch block)    STEMI (ST  elevation myocardial infarction) ([place]) 02/26/2012   a.) LHC/PCI 02/26/2012 --> 99% mRCA (3.5 x 15 mm and 3.5 x 12 mm Integrity BMS); b.) developed AVIR on catheterization table --> decompsensated to VT requiring defibrillation x 3, which achieved ROSC. Treated with amiodarone gtt.   Past Surgical History:  Procedure Laterality Date   CARDIAC CATHETERIZATION     CORONARY ANGIOPLASTY     CORONARY STENT INTERVENTION N/A 11/10/2019   Procedure: CORONARY STENT INTERVENTION;  Surgeon: Isaias Cowman, MD;  Location: [facility];  Service: Cardiovascular;  Laterality: N/A;   CORONARY STENT PLACEMENT  02/2012   CYSTOSCOPY W/ RETROGRADES Left 11/18/2016   Procedure: CYSTOSCOPY WITH RETROGRADE PYELOGRAM;  Surgeon: Irine Seal, MD;  Location: [facility];  Service: Urology;  Laterality: Left;   CYSTOSCOPY WITH STENT PLACEMENT Left 11/18/2016   Procedure: CYSTOSCOPY WITH STENT PLACEMENT;  Surgeon: Irine Seal, MD;  Location: [facility];  Service: Urology;  Laterality: Left;   CYSTOSCOPY/URETEROSCOPY/HOLMIUM LASER/STENT PLACEMENT Left 11/29/2016   Procedure: CYSTOSCOPY/URETEROSCOPY/HOLMIUM LASER/STENT EXCHANGE;  Surgeon: Hollice Espy, MD;  Location: [facility];  Service: Urology;  Laterality: Left;   CYSTOSCOPY/URETEROSCOPY/HOLMIUM LASER/STENT PLACEMENT Right 03/07/2017   Procedure: CYSTOSCOPY/URETEROSCOPY/HOLMIUM LASER/STENT PLACEMENT;  Surgeon: Hollice Espy, MD;  Location: [facility];  Service: Urology;  Laterality: Right;   EXTRACORPOREAL SHOCK WAVE LITHOTRIPSY Left 02/03/2021   Procedure: EXTRACORPOREAL SHOCK WAVE LITHOTRIPSY (ESWL);  Surgeon: Hollice Espy, MD;  Location: [facility];  Service: Urology;  Laterality: Left;   EXTRACORPOREAL SHOCK WAVE LITHOTRIPSY Right 06/22/2022   Procedure: EXTRACORPOREAL SHOCK WAVE LITHOTRIPSY (ESWL);  Surgeon: Abbie Sons, MD;  Location: [facility];  Service: Urology;  Laterality: Right;   JOINT REPLACEMENT     right hip   LAPAROSCOPIC GASTRIC  RESTRICTIVE DUODENAL PROCEDURE (DUODENAL SWITCH)     w/hiatal hernia repair   LAPAROSCOPIC GASTRIC SLEEVE RESECTION  08/21/2013   LEFT HEART CATH AND CORONARY ANGIOGRAPHY N/A 11/10/2019   Procedure: LEFT HEART CATH AND CORONARY ANGIOGRAPHY;  Surgeon: Teodoro Spray, MD;  Location: [facility];  Service: Cardiovascular;  Laterality: N/A;   skin removal surgery     removal of extra skin approx 20 lbs   STONE EXTRACTION WITH BASKET Right 03/07/2017   Procedure: STONE EXTRACTION WITH BASKET;  Surgeon: Hollice Espy, MD;  Location: [facility];  Service: Urology;  Laterality: Right;   TONSILLECTOMY AND ADENOIDECTOMY  1960   TOTAL HIP ARTHROPLASTY Right 01/18/2017   Procedure: TOTAL HIP ARTHROPLASTY ANTERIOR APPROACH;  Surgeon: Hessie Knows, MD;  Location: [facility];  Service: Orthopedics;  Laterality: Right;      Assessment and Plan:   Right kidney stone - He recently had a cardiac event, possibly unstable angina with overnight admission just prior to the lithotripsy.  - He is yet to follow up with his cardiologist, scheduled to see him February 6 - We discussed treatment options versus a second attempt at shockwave which would necessitate holding  aspirin and Plavix versus ureteroscopy where only Plavix would need to be held.  - We will have to obtain cardiac clearance from Dr. Laurance Flatten at [facility] regarding which of the procedures he feels would ve rthe safest for this patient in terms of holding his anti-platelet therapy.  -Mr. Iannucci is amenable to either procedure, has had both done, and understand the risks and benefits.    Follow Up Instructions:    Follow up after seeing his cardiologist, Dr. Laurance Flatten.   I discussed the assessment and treatment plan with the patient. The patient was provided an opportunity to ask questions and all were answered. The patient agreed with the plan and demonstrated an understanding of the instructions.   The patient was advised to call back or  seek an in-person evaluation if the symptoms worsen or if the condition fails to improve as anticipated.   I provided 7 minutes of non-face-to-face time during this encounter.   I have reviewed the above documentation for accuracy and completeness, and I agree with the above.    Hollice Espy, MD

## 2022-08-07 NOTE — Transfer of Care (Signed)
Immediate Anesthesia Transfer of Care Note  Patient: William Armstrong  Procedure(s) Performed: CYSTOSCOPY/URETEROSCOPY/HOLMIUM LASER/STENT PLACEMENT (Right: Ureter)  Patient Location: PACU  Anesthesia Type:General  Level of Consciousness: awake, drowsy, and patient cooperative  Airway & Oxygen Therapy: Patient Spontanous Breathing and Patient connected to face mask oxygen  Post-op Assessment: Report given to RN and Post -op Vital signs reviewed and stable  Post vital signs: Reviewed and stable  Last Vitals:  Vitals Value Taken Time  BP 133/45 08/07/22 1017  Temp    Pulse 72 08/07/22 1020  Resp 16 08/07/22 1020  SpO2 99 % 08/07/22 1020  Vitals shown include unvalidated device data.  Last Pain:  Vitals:   08/07/22 0716  TempSrc: Temporal  PainSc: 3          Complications: No notable events documented.

## 2022-08-07 NOTE — Op Note (Signed)
Date of procedure: 08/07/22  Preoperative diagnosis:  Right renal pelvic stone  Postoperative diagnosis:  Right ureteral stone  Procedure: Right ureteroscopy Right retrograde pyelogram Laser lithotripsy Basket extraction of stone fragment Right ureteral stent placement Interpretation of fluoroscopy less than 30 minutes  Surgeon: Hollice Espy, MD  Anesthesia: General  Complications: None  Intraoperative findings: Interval migration of renal pelvic stone to the level of the iliacs, high-grade obstruction with tortuosity and adjacent ureteral fold.  Difficulty placing wire around stone but ultimately successful as well as difficulty accessing stone due to presence of ureteral tortuosity just distal to the location of the stone.  Stent placed without tether.  EBL: Minimal  Specimens: None  Drains: 6 x 26 French double-J ureteral stent on right  Indication: William Armstrong is a 72 y.o. patient with obstructing ureteral calculus on the right.  After reviewing the management options for treatment, he elected to proceed with the above surgical procedure(s). We have discussed the potential benefits and risks of the procedure, side effects of the proposed treatment, the likelihood of the patient achieving the goals of the procedure, and any potential problems that might occur during the procedure or recuperation. Informed consent has been obtained.  Description of procedure:  The patient was taken to the operating room and general anesthesia was induced.  The patient was placed in the dorsal lithotomy position, prepped and draped in the usual sterile fashion, and preoperative antibiotics were administered. A preoperative time-out was performed.   A 21 French cystoscope was passed per urethra into the bladder.  Notably there was some mild debris in the bladder.  Attention was turned to the right side.  On scout imaging, the stone is previously located in the renal pelvis was now located near  the level of the iliacs.  I intubated the right UO and contrast was injected confirming there was a filling defect at this level with a significantly tortuous ureter just distal to the stone and no contrast above the level of the stone.  I then attempted to get a safety wire around the stone and failed attempted both a sensor wire as well as angled Glidewire.  Ultimately, I advanced the open-ended just distal to the stone and injected a slurry of lubricant and contrast.  With this cocktail in place, I was able to get the angled Glidewire around the stone and then advanced a 5 [place] open-ended ureteral catheter to the proximal ureter and exchanged the wire for a sensor wire which was then snapped in place.  I then advanced a semirigid ureteroscope up to the level of the stone however this was very technically challenging as the ureter doubled back on itself and there was a fold.  I ended up using a railroad technique to introduce the scope into the portion of the ureter containing the stone and then brought in a 200 m laser fiber in using very settings initially 0.3 J and 60 Hz, later 80 Hz with variable powers to try to fragment the stone.  I then attempted to remove the stone fragments with a basket and was in fact able to pull it through the folded area but not through the more distal ureter and ended up being back in the laser and further dusted the stone.  Finally, I attempted to advance the scope back to the level where the stone had originally been just proximal to the fold with a significant amount of difficulty getting back to this location.  Ultimately after a lot  of maneuvers I was successful in doing so and ended up fragmenting the remainder of the stone.  A final retrograde pyelogram at this location showed a significantly dilated proximal ureter and hydronephrotic collecting system with severe hydronephrosis.  There was no contrast extravasation.  I did have some concerns however that the fragments  would have difficulty progressing down to the level of the bladder.  Due to this particular anatomy and as such I elected to place a stent for a prolonged period of time to help straighten the ureter and facilitate this process.  I backloaded the safety wire over rigid cystoscope and advanced a 6 x 26 French double-J ureteral stent up to the level of the kidney.  The wire was withdrawn until full coil was noted both within the renal pelvis as well as within the bladder.  The bladder was then drained, the patient was then cleaned and dried, repositioned in the supine position, reversed of anesthesia, and taken to the PACU in stable condition.  Plan: Will have him keep the stent for 3 to 4 weeks to facilitate passage of stone fragments.  Will schedule cystoscopy stent removal in the office.  May resume all blood thinner and antiplatelet therapy.  Hollice Espy, M.D.

## 2022-08-07 NOTE — Anesthesia Postprocedure Evaluation (Signed)
Anesthesia Post Note  Patient: William Armstrong  Procedure(s) Performed: CYSTOSCOPY/URETEROSCOPY/HOLMIUM LASER/STENT PLACEMENT (Right: Ureter)  Patient location during evaluation: PACU Anesthesia Type: General Level of consciousness: awake and alert Pain management: pain level controlled Vital Signs Assessment: post-procedure vital signs reviewed and stable Respiratory status: spontaneous breathing, nonlabored ventilation, respiratory function stable and patient connected to nasal cannula oxygen Cardiovascular status: blood pressure returned to baseline and stable Postop Assessment: no apparent nausea or vomiting Anesthetic complications: no   No notable events documented.   Last Vitals:  Vitals:   08/07/22 1049 08/07/22 1110  BP: (!) 96/46 128/65  Pulse: 77 84  Resp: 18 17  Temp:  (!) 36.1 C  SpO2: 94% 95%    Last Pain:  Vitals:   08/07/22 1110  TempSrc: Tympanic  PainSc: 0-No pain                 Martha Clan

## 2022-08-07 NOTE — Anesthesia Procedure Notes (Addendum)
Procedure Name: Intubation Date/Time: 08/07/2022 8:53 AM  Performed by: Kelton Pillar, CRNAPre-anesthesia Checklist: Patient identified, Emergency Drugs available, Suction available and Patient being monitored Patient Re-evaluated:Patient Re-evaluated prior to induction Oxygen Delivery Method: Circle system utilized Preoxygenation: Pre-oxygenation with 100% oxygen Induction Type: IV induction Ventilation: Mask ventilation without difficulty Laryngoscope Size: McGraph and 3 Grade View: Grade I Tube type: Oral Tube size: 7.0 mm Number of attempts: 1 Airway Equipment and Method: Stylet and Oral airway Placement Confirmation: ETT inserted through vocal cords under direct vision, positive ETCO2, breath sounds checked- equal and bilateral and CO2 detector Secured at: 21 cm Tube secured with: Tape Dental Injury: Teeth and Oropharynx as per pre-operative assessment

## 2022-08-08 ENCOUNTER — Encounter: Payer: Self-pay | Admitting: Urology

## 2022-08-09 ENCOUNTER — Encounter: Payer: Self-pay | Admitting: Urology

## 2022-08-16 ENCOUNTER — Other Ambulatory Visit: Payer: Self-pay

## 2022-08-16 ENCOUNTER — Emergency Department
Admission: EM | Admit: 2022-08-16 | Discharge: 2022-08-16 | Disposition: A | Discharge status: Home / Self Care | Payer: Medicare Other | Attending: Emergency Medicine | Admitting: Emergency Medicine

## 2022-08-16 ENCOUNTER — Emergency Department: Payer: Medicare Other

## 2022-08-16 DIAGNOSIS — M545 Low back pain, unspecified: Secondary | ICD-10-CM | POA: Insufficient documentation

## 2022-08-16 DIAGNOSIS — I509 Heart failure, unspecified: Secondary | ICD-10-CM | POA: Insufficient documentation

## 2022-08-16 DIAGNOSIS — M79605 Pain in left leg: Secondary | ICD-10-CM

## 2022-08-16 DIAGNOSIS — I110 Hypertensive heart disease with heart failure: Secondary | ICD-10-CM | POA: Diagnosis not present

## 2022-08-16 DIAGNOSIS — I2510 Atherosclerotic heart disease of native coronary artery without angina pectoris: Secondary | ICD-10-CM | POA: Diagnosis not present

## 2022-08-16 DIAGNOSIS — M79652 Pain in left thigh: Secondary | ICD-10-CM | POA: Diagnosis not present

## 2022-08-16 DIAGNOSIS — J449 Chronic obstructive pulmonary disease, unspecified: Secondary | ICD-10-CM | POA: Diagnosis not present

## 2022-08-16 LAB — CBC WITH DIFFERENTIAL/PLATELET
Abs Immature Granulocytes: 0.02 10*3/uL (ref 0.00–0.07)
Basophils Absolute: 0 10*3/uL (ref 0.0–0.1)
Basophils Relative: 0 %
Eosinophils Absolute: 0.2 10*3/uL (ref 0.0–0.5)
Eosinophils Relative: 2 %
HCT: 38.9 % — ABNORMAL LOW (ref 39.0–52.0)
Hemoglobin: 12.2 g/dL — ABNORMAL LOW (ref 13.0–17.0)
Immature Granulocytes: 0 %
Lymphocytes Relative: 20 %
Lymphs Abs: 1.4 10*3/uL (ref 0.7–4.0)
MCH: 28 pg (ref 26.0–34.0)
MCHC: 31.4 g/dL (ref 30.0–36.0)
MCV: 89.2 fL (ref 80.0–100.0)
Monocytes Absolute: 0.6 10*3/uL (ref 0.1–1.0)
Monocytes Relative: 8 %
Neutro Abs: 5 10*3/uL (ref 1.7–7.7)
Neutrophils Relative %: 70 %
Platelets: 165 10*3/uL (ref 150–400)
RBC: 4.36 MIL/uL (ref 4.22–5.81)
RDW: 16.3 % — ABNORMAL HIGH (ref 11.5–15.5)
WBC: 7.2 10*3/uL (ref 4.0–10.5)
nRBC: 0 % (ref 0.0–0.2)

## 2022-08-16 LAB — BASIC METABOLIC PANEL
Anion gap: 6 (ref 5–15)
BUN: 17 mg/dL (ref 8–23)
CO2: 26 mmol/L (ref 22–32)
Calcium: 8.5 mg/dL — ABNORMAL LOW (ref 8.9–10.3)
Chloride: 108 mmol/L (ref 98–111)
Creatinine, Ser: 0.85 mg/dL (ref 0.61–1.24)
GFR, Estimated: >60 mL/min (ref >60)
Glucose, Bld: 106 mg/dL — ABNORMAL HIGH (ref 70–99)
Potassium: 3.4 mmol/L — ABNORMAL LOW (ref 3.5–5.1)
Sodium: 140 mmol/L (ref 135–145)

## 2022-08-16 LAB — CK: Total CK: 39 U/L — ABNORMAL LOW (ref 49–397)

## 2022-08-16 MED ORDER — GABAPENTIN 100 MG PO CAPS: 100.0000 mg | ORAL_CAPSULE | Freq: Three times a day (TID) | ORAL | 2 refills | Status: DC

## 2022-08-16 MED ORDER — KETOROLAC TROMETHAMINE 15 MG/ML IJ SOLN: 15.0000 mg | Freq: Once | INTRAMUSCULAR | Status: DC

## 2022-08-16 MED ORDER — ACETAMINOPHEN 500 MG PO TABS
1000.0000 mg | ORAL_TABLET | Freq: Once | ORAL | Status: AC
  Administered 2022-08-16: 1000 mg via ORAL
  Filled 2022-08-16: qty 2

## 2022-08-16 NOTE — ED Provider Notes (Signed)
[facility] Provider Note    Event Date/Time   First MD Initiated Contact with Patient 08/16/22 0533     (approximate)   History   Leg Pain   HPI  William Armstrong is a 72 y.o. male   Past medical history of CAD, pulmonary hypertension, heart failure, COPD, hypertension, hyperlipidemia comes to the emergency department with left leg pain.  3 days worsening.  Left buttock/lateral thigh rating down to the knee.  Aching sore pain with occasional sharp pain worse with movements worse with palpation, sitting in certain positions. No back pain.  No falls or injuries.  NO Chest pain, shortness of breath, respiratory symptoms, GI or GU complaints.   External Medical Documents Reviewed: Orthopedics appointment dated 08/01/2022 for knee joint steroid injection      Physical Exam   Triage Vital Signs: ED Triage Vitals  Enc Vitals Group     BP 08/16/22 0541 (!) 148/127     Pulse Rate 08/16/22 0541 76     Resp 08/16/22 0541 18     Temp 08/16/22 0541 (!) 97.5 F (36.4 C)     Temp Source 08/16/22 0541 Oral     SpO2 08/16/22 0540 98 %     Weight 08/16/22 0542 245 lb (111.1 kg)     Height 08/16/22 0542 '5\' 7"'$  (1.702 m)     Head Circumference --      Peak Flow --      Pain Score 08/16/22 0542 7     Pain Loc --      Pain Edu? --      Excl. in [place]? --     Most recent vital signs: Vitals:   08/16/22 0540 08/16/22 0541  BP:  (!) 148/127  Pulse:  76  Resp:  18  Temp:  (!) 97.5 F (36.4 C)  SpO2: 98% 98%    General: Awake, no distress.  CV:  Good peripheral perfusion.  Resp:  Normal effort.  Abd:  No distention.  Other:  Awake alert comfortable nontoxic-appearing with normal vital signs and afebrile.  He is able to range the left hip and left knee with full range of motion.  He has point tenderness to the left lateral thigh/inferior buttock but no overlying signs of injury, bruising, hematoma, masses, or skin rash noted.  The remainder of the left lower  extremity is neurovascular intact foot appears warm well-perfused w strong pedal pulse; sensation intact throughout   ED Results / Procedures / Treatments   Labs (all labs ordered are listed, but only abnormal results are displayed) Labs Reviewed  BASIC METABOLIC PANEL - Abnormal; Notable for the following components:      Result Value   Potassium 3.4 (*)    Glucose, Bld 106 (*)    Calcium 8.5 (*)    All other components within normal limits  CBC WITH DIFFERENTIAL/PLATELET - Abnormal; Notable for the following components:   Hemoglobin 12.2 (*)    HCT 38.9 (*)    RDW 16.3 (*)    All other components within normal limits  CK - Abnormal; Notable for the following components:   Total CK 39 (*)    All other components within normal limits     I ordered and reviewed the above labs they are notable for normal white blood cell count and H&H is at baseline.   PROCEDURES:  Critical Care performed: No  Procedures   I personally reviewed and interpreted x-ray of the knee and see  no obvious fracture or dislocation  MEDICATIONS ORDERED IN ED: Medications  acetaminophen (TYLENOL) tablet 1,000 mg (has no administration in time range)     IMPRESSION / MDM / ASSESSMENT AND PLAN / ED COURSE  I reviewed the triage vital signs and the nursing notes.                                Patient's presentation is most consistent with acute presentation with potential threat to life or bodily function.  Differential diagnosis includes, but is not limited to, musculoskeletal pain, hip fracture dislocation, knee fracture dislocation, rhabdomyolysis, radiculopathy, considered ischemic limb, septic joint but less likely   The patient is on the cardiac monitor to evaluate for evidence of arrhythmia and/or significant heart rate changes.  MDM: This is a patient with atraumatic left buttock pain and lateral thigh pain that is point tender and worse with movement and occasional sharp shooting pain  with constant aching pain.  Consistent w sciatica or radiculopathy.  No back pain or red flag signs/symptoms to suggest emergent cord pathology.  Consider musculoskeletal pain or injury though without trauma I think this is less likely.  He is able to range at both the hip and the knee so I think fracture/dislocation or septic joint is less likely as well.  Consider shingles but no rash and quality of pain will be very atypical for this diagnosis.  Would be an unusual presentation and distribution of pain for DVT.  Plan will be for ultrasound to rule out DVT, x-rays, basic labs as well as CK, pain control. If all neg from above w/u and plan will be for discharge with orthopedics and PMD follow-up.  I advised him to continue with Tylenol, trial gabapentin.         FINAL CLINICAL IMPRESSION(S) / ED DIAGNOSES   Final diagnoses:  Left leg pain     Rx / DC Orders   ED Discharge Orders     None        Note:  This document was prepared using Dragon voice recognition software and may include unintentional dictation errors.    Lucillie Garfinkel, MD 08/16/22 [phone]

## 2022-08-16 NOTE — ED Triage Notes (Signed)
Pt from home to [facility] ED via [facility] with c/o left hip and leg pain x2weeks. Pt states that his pain has gotten increasingly worse on ambulation within the last week. Pt is currently in 10/10 pain on palpation and denies falling or any trauma. Has a hx of R hip arthroplasty.

## 2022-08-16 NOTE — Discharge Instructions (Addendum)
Take tylenol 650 mg every 6 hours for pain.   Call Dr Karel Jarvis for an appointment.   Thank you for choosing [place] for your health care today!  Please see your primary doctor this week for a follow up appointment.   Sometimes, in the early stages of certain disease courses it is difficult to detect in the emergency department evaluation -- so, it is important that you continue to monitor your symptoms and call your doctor right away or return to the emergency department if you develop any new or worsening symptoms.  Please go to the following website to schedule new (and existing) patient appointments:   [email or web address]  If you do not have a primary doctor try calling the following clinics to establish care:  If you have insurance:  [facility] [id number] [place] [address]   [facility]  [phone] [address] [place] [address]   If you do not have insurance:  [facility]  [phone] [address], [place] [address]   The following is another list of primary care offices in the area who are accepting new patients at this time.  Please reach out to one of them directly and let them know you would like to schedule an appointment to follow up on an Emergency Department visit, and/or to establish a new primary care provider (PCP).  There are likely other primary care clinics in the are who are accepting new patients, but this is an excellent place to start:  [place] physician: Dr Lavon Paganini [address] [place], [place] [address] [phone]  [facility] Lead Physician: Dr Steele Sizer [address], [place], [place] [address] [phone]  [place] Physician: Dr Park Liter [address] [place], [place] [address] [phone]  [facility] Lead Physician: Dr Dewaine Oats [place], [place], [place] [address] [phone]  [facility] [place] Physician: Dr Halina Maidens [address] [place], [place] [address] [phone]   It was my pleasure to care for you today.   Hoover Brunette Jacelyn Grip, MD

## 2022-08-20 ENCOUNTER — Encounter: Payer: Self-pay | Admitting: Urology

## 2022-08-30 ENCOUNTER — Other Ambulatory Visit: Payer: Self-pay | Admitting: Physical Medicine & Rehabilitation

## 2022-08-30 DIAGNOSIS — M5442 Lumbago with sciatica, left side: Secondary | ICD-10-CM

## 2022-08-31 ENCOUNTER — Ambulatory Visit
Admission: RE | Admit: 2022-08-31 | Discharge: 2022-08-31 | Disposition: A | Discharge status: Home / Self Care | Payer: Medicare Other | Source: Ambulatory Visit | Attending: Physical Medicine & Rehabilitation | Admitting: Physical Medicine & Rehabilitation

## 2022-08-31 DIAGNOSIS — M5442 Lumbago with sciatica, left side: Secondary | ICD-10-CM | POA: Diagnosis not present

## 2022-09-05 ENCOUNTER — Ambulatory Visit: Payer: Medicare Other | Admitting: Physician Assistant

## 2022-09-06 ENCOUNTER — Ambulatory Visit (INDEPENDENT_AMBULATORY_CARE_PROVIDER_SITE_OTHER): Payer: Medicare Other | Admitting: Urology

## 2022-09-06 VITALS — BP 101/66 | HR 78 | Wt 245.0 lb

## 2022-09-06 DIAGNOSIS — Z466 Encounter for fitting and adjustment of urinary device: Secondary | ICD-10-CM | POA: Diagnosis not present

## 2022-09-06 DIAGNOSIS — N200 Calculus of kidney: Secondary | ICD-10-CM | POA: Diagnosis not present

## 2022-09-06 LAB — URINALYSIS, COMPLETE
Bilirubin, UA: NEGATIVE
Glucose, UA: NEGATIVE
Nitrite, UA: NEGATIVE
Specific Gravity, UA: 1.02 (ref 1.005–1.030)
Urobilinogen, Ur: 1 mg/dL (ref 0.2–1.0)
pH, UA: 5 (ref 5.0–7.5)

## 2022-09-06 LAB — MICROSCOPIC EXAMINATION: RBC, Urine: >30 /hpf — AB (ref 0–2)

## 2022-09-06 MED ORDER — SULFAMETHOXAZOLE-TRIMETHOPRIM 800-160 MG PO TABS
1.0000 | ORAL_TABLET | Freq: Two times a day (BID) | ORAL | Status: AC
  Administered 2022-09-06: 1 via ORAL

## 2022-09-06 NOTE — Progress Notes (Signed)
   09/06/22  CC:  Chief Complaint  Patient presents with   Cysto Stent Removal    HPI: 72 year old male with a right nephrolithiasis who returns today for stent removal.  Given the complexity of the ureteroscopy as well as stone debris, stent was left for 1 month postop.  Blood pressure 101/66, pulse 78, weight 245 lb (111.1 kg). NED. A&Ox3.   No respiratory distress   Abd soft, NT, ND Normal phallus with bilateral descended testicles  Cystoscopy/ Stent removal procedure  Patient identification was confirmed, informed consent was obtained, and patient was prepped using Betadine solution.  Lidocaine jelly was administered per urethral meatus.    Preoperative abx where received prior to procedure.    Procedure: - Flexible cystoscope introduced, without any difficulty.   - Thorough search of the bladder revealed:    normal urethral meatus  Stent seen emanating from right ureteral orifice, grasped with stent graspers, and removed in entirety.  At the time of the stent was partially dislodged, the graspers the last group of the stent.  I did reintroduced the scope back up into the bladder neck grabbed a more proximal coil of the stent.  Ultimately I was able to remove the stent with some very gentle traction.  There was some encrustation of the stent.   Post-Procedure: - Patient tolerated the procedure well   Assessment/ Plan:  1. Right kidney stone Status post ureteroscopy  Warning symptoms reviewed  Prophylactic antibiotics given today  Follow-up in 4 to 6 weeks with renal ultrasound prior - Urinalysis, Complete - sulfamethoxazole-trimethoprim (BACTRIM DS) 800-160 MG per tablet 1 tablet - Ultrasound renal complete; Future  2. Encounter for removal of ureteral stent As above - sulfamethoxazole-trimethoprim (BACTRIM DS) 800-160 MG per tablet 1 tablet - Ultrasound renal complete; Future  F/u 6 weeks with RUS prior   Hollice Espy, MD

## 2022-09-08 ENCOUNTER — Other Ambulatory Visit
Admission: RE | Admit: 2022-09-08 | Discharge: 2022-09-08 | Disposition: A | Discharge status: Home / Self Care | Payer: Medicare Other | Source: Ambulatory Visit | Attending: Internal Medicine | Admitting: Internal Medicine

## 2022-09-08 DIAGNOSIS — M7989 Other specified soft tissue disorders: Secondary | ICD-10-CM | POA: Diagnosis not present

## 2022-09-08 DIAGNOSIS — M79662 Pain in left lower leg: Secondary | ICD-10-CM | POA: Insufficient documentation

## 2022-09-08 LAB — D-DIMER, QUANTITATIVE: D-Dimer, Quant: 0.76 ug/mL-FEU — ABNORMAL HIGH (ref 0.00–0.50)

## 2022-10-11 ENCOUNTER — Ambulatory Visit
Admission: RE | Admit: 2022-10-11 | Discharge: 2022-10-11 | Disposition: A | Discharge status: Home / Self Care | Payer: Medicare Other | Source: Ambulatory Visit | Attending: Urology | Admitting: Urology

## 2022-10-11 DIAGNOSIS — N200 Calculus of kidney: Secondary | ICD-10-CM | POA: Diagnosis not present

## 2022-10-11 DIAGNOSIS — Z466 Encounter for fitting and adjustment of urinary device: Secondary | ICD-10-CM | POA: Diagnosis present

## 2022-10-17 ENCOUNTER — Ambulatory Visit (INDEPENDENT_AMBULATORY_CARE_PROVIDER_SITE_OTHER): Payer: Medicare Other | Admitting: Urology

## 2022-10-17 VITALS — BP 117/68 | HR 66 | Ht 67.0 in | Wt 245.0 lb

## 2022-10-17 DIAGNOSIS — Z87442 Personal history of urinary calculi: Secondary | ICD-10-CM

## 2022-10-17 DIAGNOSIS — Z09 Encounter for follow-up examination after completed treatment for conditions other than malignant neoplasm: Secondary | ICD-10-CM

## 2022-10-17 DIAGNOSIS — N200 Calculus of kidney: Secondary | ICD-10-CM

## 2022-10-17 NOTE — Progress Notes (Signed)
I,Amy L Pierron,acting as a scribe for Vanna Scotland, MD.,have documented all relevant documentation on the behalf of Vanna Scotland, MD,as directed by  Vanna Scotland, MD while in the presence of Vanna Scotland, MD.  10/17/2022 3:02 PM   William Armstrong [birth year] [id number]  Referring provider: Gracelyn Nurse, MD [address] [place],  [place] [address]  Chief Complaint  Patient presents with   Follow-up    HPI: 72 year-old male returns today following right ureteroscopy.  On 08/07/2022 at the time of surgery his right renal pelvic stone had migrated distally and was noted to be at the level of the iliacs with a high grade obstruction. He maintained his stent for a prolonged period of time due to this. He followed up with a renal ultrasound on 10/13/2022 which was unremarkable; no hydrophoresis or residual stone disease. He does have a personal history of kidney stones. He had a Litholink in 2022 and was managed on potassium citrate. He had a repeat Litholink in 2024 and the potassium citrate was stopped based on the results.   He mentions he had a collection of stones from using the urine strain, too bad they aren't diamonds. He has 2 herniated discs but otherwise he is doing well overall.    PMH: Past Medical History:  Diagnosis Date   Anemia    Angina pectoris (HCC)    Aortic stenosis 02/14/2021   a.) TTE 02/14/2021: trivial AS (MPG 10.6)   Arthritis    BCC (basal cell carcinoma of skin)    BPH (benign prostatic hyperplasia)    Bradycardia    CAFL (chronic airflow limitation) (HCC)    CHF (congestive heart failure) (HCC)    a.) TTE 02/26/2012: EF 50%, mod LVH, post HHK, RVE, triv AR/MR, mild TR/PR, PASP 44; b.) TTE 10/11/2012: EF >55%, interm paradoxical sep motion, PASP 45; c.) TTE 03/18/2015: EF 40%, inf/post HK, mild panval regurg, PASP 40-45, G1DD; d.) TTE 02/14/2021: EF 50%, LVH, inf/inferosep HK, mod BAE, mod RVE, mild AR/TR, mod MR/PR, triv AS (MPG 10.6)   COPD  (chronic obstructive pulmonary disease) (HCC)    Coronary artery disease    a.) LHC/PCI 02/26/2012: 99% mRCA (3.5 x 15 mm and 2.5 x 12 mm Integrity BMS); b.) LHC/PCI 10/11/2021: 99% oRCA, 80% ISR pRCA --> faint L-R collateral formation --> DES x 2 (unknown type) placed   Depression    Glaucoma    H/O ventricular fibrillation 02/26/2012   a.) in setting of STEMI --> required defibrillation (360 J) x 3 + amiodarone gtt   History of kidney stones    HLD (hyperlipidemia)    HTN (hypertension)    Hypertension    Hypokalemia    Ischemic cardiomyopathy    a.) TTE 02/26/2012: EF 50%; b.) TTE 10/11/2012: EF >55%; c.) TTE 03/18/2015: EF 40%; d.) TTE 02/14/2021: EF 50%   Long term current use of antithrombotics/antiplatelets    a.) DAPT (ASA + clopidogrel)   Morbid obesity (HCC)    NSTEMI (non-ST elevated myocardial infarction) (HCC) 11/09/2019   a.) LHC/PCI 11/10/2019: 5% mRCA, 99% ISR pRCA-1 (3.5 x 22 mm Resolute Onxy DES), 15% pRCA-2, 50% oRCA, 100% RPDA.   OSA (obstructive sleep apnea)    a.) no longer requires nocturnal s/p ~250 lb weight loss following bariatric surgery   Pulmonary HTN (HCC) 02/26/2012   a.) TTE 02/26/2012: PASP 44; b.) TTE 10/11/2012: PASP 45; c.) TTE 03/18/2015: PASP 40-45   RBBB (right bundle branch block)    STEMI (  ST elevation myocardial infarction) (HCC) 02/26/2012   a.) LHC/PCI 02/26/2012 --> 99% mRCA (3.5 x 15 mm and 3.5 x 12 mm Integrity BMS); b.) developed AVIR on catheterization table --> decompsensated to VT requiring defibrillation x 3, which achieved ROSC. Treated with amiodarone gtt.    Surgical History: Past Surgical History:  Procedure Laterality Date   CARDIAC CATHETERIZATION     CORONARY ANGIOPLASTY     CORONARY STENT INTERVENTION N/A 11/10/2019   Procedure: CORONARY STENT INTERVENTION;  Surgeon: Marcina Millard, MD;  Location: [facility];  Service: Cardiovascular;  Laterality: N/A;   CORONARY STENT PLACEMENT  02/2012   CYSTOSCOPY W/  RETROGRADES Left 11/18/2016   Procedure: CYSTOSCOPY WITH RETROGRADE PYELOGRAM;  Surgeon: Bjorn Pippin, MD;  Location: [facility];  Service: Urology;  Laterality: Left;   CYSTOSCOPY WITH STENT PLACEMENT Left 11/18/2016   Procedure: CYSTOSCOPY WITH STENT PLACEMENT;  Surgeon: Bjorn Pippin, MD;  Location: [facility];  Service: Urology;  Laterality: Left;   CYSTOSCOPY/URETEROSCOPY/HOLMIUM LASER/STENT PLACEMENT Left 11/29/2016   Procedure: CYSTOSCOPY/URETEROSCOPY/HOLMIUM LASER/STENT EXCHANGE;  Surgeon: Vanna Scotland, MD;  Location: [facility];  Service: Urology;  Laterality: Left;   CYSTOSCOPY/URETEROSCOPY/HOLMIUM LASER/STENT PLACEMENT Right 03/07/2017   Procedure: CYSTOSCOPY/URETEROSCOPY/HOLMIUM LASER/STENT PLACEMENT;  Surgeon: Vanna Scotland, MD;  Location: [facility];  Service: Urology;  Laterality: Right;   CYSTOSCOPY/URETEROSCOPY/HOLMIUM LASER/STENT PLACEMENT Right 08/07/2022   Procedure: CYSTOSCOPY/URETEROSCOPY/HOLMIUM LASER/STENT PLACEMENT;  Surgeon: Vanna Scotland, MD;  Location: [facility];  Service: Urology;  Laterality: Right;   EXTRACORPOREAL SHOCK WAVE LITHOTRIPSY Left 02/03/2021   Procedure: EXTRACORPOREAL SHOCK WAVE LITHOTRIPSY (ESWL);  Surgeon: Vanna Scotland, MD;  Location: [facility];  Service: Urology;  Laterality: Left;   EXTRACORPOREAL SHOCK WAVE LITHOTRIPSY Right 06/22/2022   Procedure: EXTRACORPOREAL SHOCK WAVE LITHOTRIPSY (ESWL);  Surgeon: Riki Altes, MD;  Location: [facility];  Service: Urology;  Laterality: Right;   JOINT REPLACEMENT     right hip   LAPAROSCOPIC GASTRIC RESTRICTIVE DUODENAL PROCEDURE (DUODENAL SWITCH)     w/hiatal hernia repair   LAPAROSCOPIC GASTRIC SLEEVE RESECTION  08/21/2013   LEFT HEART CATH AND CORONARY ANGIOGRAPHY N/A 11/10/2019   Procedure: LEFT HEART CATH AND CORONARY ANGIOGRAPHY;  Surgeon: Dalia Heading, MD;  Location: [facility];  Service: Cardiovascular;  Laterality: N/A;   skin removal surgery     removal of extra skin approx 20 lbs    STONE EXTRACTION WITH BASKET Right 03/07/2017   Procedure: STONE EXTRACTION WITH BASKET;  Surgeon: Vanna Scotland, MD;  Location: [facility];  Service: Urology;  Laterality: Right;   TONSILLECTOMY AND ADENOIDECTOMY  1960   TOTAL HIP ARTHROPLASTY Right 01/18/2017   Procedure: TOTAL HIP ARTHROPLASTY ANTERIOR APPROACH;  Surgeon: Kennedy Bucker, MD;  Location: [facility];  Service: Orthopedics;  Laterality: Right;    Home Medications:  Allergies as of 10/17/2022       Reactions   Atorvastatin Other (See Comments)   Muscle aches. Other reaction(s): Other (See Comments) Muscle aches. Muscle pain   Pregabalin Other (See Comments)   Muscle soreness Other reaction(s): Other (See Comments) Muscle soreness Severe muscle pain        Medication List        Accurate as of Oct 17, 2022  3:02 PM. If you have any questions, ask your nurse or doctor.          STOP taking these medications    gabapentin 100 MG capsule Commonly known as: Neurontin   HYDROcodone-acetaminophen 5-325 MG tablet Commonly known as: NORCO/VICODIN   oxybutynin 5 MG tablet  Commonly known as: DITROPAN       TAKE these medications    aspirin EC 81 MG tablet Take 81 mg by mouth daily.   clopidogrel 75 MG tablet Commonly known as: PLAVIX Take 75 mg by mouth daily.   ferrous sulfate 325 (65 FE) MG EC tablet Take 325 mg by mouth daily with breakfast.   latanoprost 0.005 % ophthalmic solution Commonly known as: XALATAN Place 1 drop into both eyes at bedtime.   lisinopril 40 MG tablet Commonly known as: ZESTRIL Take 40 mg by mouth daily.   nitroGLYCERIN 0.4 MG SL tablet Commonly known as: NITROSTAT Place 0.4 mg under the tongue every 5 (five) minutes as needed for chest pain.   rosuvastatin 40 MG tablet Commonly known as: CRESTOR Take 1 tablet by mouth daily.   sertraline 100 MG tablet Commonly known as: ZOLOFT Take 1 tablet (100 mg total) by mouth daily.   tamsulosin 0.4 MG Caps  capsule Commonly known as: Flomax Take 1 capsule (0.4 mg total) by mouth daily.   Vitamin D 50 MCG (2000 UT) Caps Take 2,000 Units by mouth daily.        Allergies:  Allergies  Allergen Reactions   Atorvastatin Other (See Comments)    Muscle aches. Other reaction(s): Other (See Comments) Muscle aches. Muscle pain   Pregabalin Other (See Comments)    Muscle soreness Other reaction(s): Other (See Comments) Muscle soreness Severe muscle pain    Family History: Family History  Problem Relation Age of Onset   Heart attack Mother    Brain cancer Father    Heart attack Sister    Congenital heart disease Sister    Leukemia Paternal Grandmother    COPD Brother    Heart disease Brother    Prostate cancer Neg Hx    Kidney cancer Neg Hx    Bladder Cancer Neg Hx     Social History:  reports that he has never smoked. He has never been exposed to tobacco smoke. He has never used smokeless tobacco. He reports that he does not drink alcohol and does not use drugs.   Physical Exam: BP 117/68   Pulse 66   Ht 5\' 7"  (1.702 m)   Wt 245 lb (111.1 kg)   BMI 38.37 kg/m   Constitutional:  Alert and oriented, No acute distress. HEENT: [place] AT, moist mucus membranes.  Trachea midline, no masses. Neurologic: Grossly intact, no focal deficits, moving all 4 extremities. Psychiatric: Normal mood and affect.  Pertinent Imaging:  Ultrasound renal complete  Narrative CLINICAL DATA:  Follow-up stone stent removal  EXAM: RENAL / URINARY TRACT ULTRASOUND COMPLETE  COMPARISON:  None Available.  FINDINGS: Right Kidney:  Renal measurements: 10.9 x 7.6 x 5.9 cm = volume: 255 mL. Contains 2 cysts with the largest measuring 3.7 cm. No follow-up imaging recommended for the cysts.  Left Kidney:  Renal measurements: 11.7 x 7.3 x 5.8 cm = volume: 257 mL. Contains multiple cysts with the largest measuring 2 cm. No follow-up imaging recommended for the cysts.  Bladder:  Appears normal  for degree of bladder distention.  Other:  None.  IMPRESSION: No significant abnormalities. No hydronephrosis.     Electronically Signed By: Gerome Sam III M.D. On: 10/13/2022 10:25  Personally reviewed today and agree with radiologic interpretation.  Assessment & Plan:    Right kidney stone  - Status post ureteroscopy. He is doing well. Encouraged drinking more water. Will contact the office if symptoms return prior to annual  visit.  Return in about 1 year (around 10/17/2023) for KUB with Sam or Carollee Herter.  [facility] [address] [place], [place] [address] [phone]

## 2022-10-24 ENCOUNTER — Encounter: Payer: Self-pay | Admitting: Intensive Care

## 2022-10-24 ENCOUNTER — Other Ambulatory Visit: Payer: Self-pay

## 2022-10-24 ENCOUNTER — Emergency Department: Payer: Medicare Other

## 2022-10-24 ENCOUNTER — Emergency Department
Admission: EM | Admit: 2022-10-24 | Discharge: 2022-10-24 | Disposition: A | Discharge status: Home / Self Care | Payer: Medicare Other | Attending: Emergency Medicine | Admitting: Emergency Medicine

## 2022-10-24 DIAGNOSIS — R42 Dizziness and giddiness: Secondary | ICD-10-CM

## 2022-10-24 LAB — BASIC METABOLIC PANEL
Anion gap: 6 (ref 5–15)
BUN: 16 mg/dL (ref 8–23)
CO2: 27 mmol/L (ref 22–32)
Calcium: 8.2 mg/dL — ABNORMAL LOW (ref 8.9–10.3)
Chloride: 106 mmol/L (ref 98–111)
Creatinine, Ser: 0.69 mg/dL (ref 0.61–1.24)
GFR, Estimated: >60 mL/min (ref >60)
Glucose, Bld: 103 mg/dL — ABNORMAL HIGH (ref 70–99)
Potassium: 3.5 mmol/L (ref 3.5–5.1)
Sodium: 139 mmol/L (ref 135–145)

## 2022-10-24 LAB — CBC
HCT: 34.8 % — ABNORMAL LOW (ref 39.0–52.0)
Hemoglobin: 11 g/dL — ABNORMAL LOW (ref 13.0–17.0)
MCH: 30.1 pg (ref 26.0–34.0)
MCHC: 31.6 g/dL (ref 30.0–36.0)
MCV: 95.3 fL (ref 80.0–100.0)
Platelets: 148 10*3/uL — ABNORMAL LOW (ref 150–400)
RBC: 3.65 MIL/uL — ABNORMAL LOW (ref 4.22–5.81)
RDW: 14.7 % (ref 11.5–15.5)
WBC: 5.1 10*3/uL (ref 4.0–10.5)
nRBC: 0 % (ref 0.0–0.2)

## 2022-10-24 MED ORDER — FLUTICASONE PROPIONATE 50 MCG/ACT NA SUSP: 2.0000 | Freq: Every day | NASAL | 0 refills | Status: DC

## 2022-10-24 MED ORDER — MECLIZINE HCL 12.5 MG PO TABS: 12.5000 mg | ORAL_TABLET | Freq: Three times a day (TID) | ORAL | 0 refills | Status: DC | PRN

## 2022-10-24 MED ORDER — MECLIZINE HCL 25 MG PO TABS
25.0000 mg | ORAL_TABLET | Freq: Once | ORAL | Status: AC
  Administered 2022-10-24: 25 mg via ORAL
  Filled 2022-10-24: qty 1

## 2022-10-24 NOTE — ED Triage Notes (Signed)
First Nurse Note:  Pt via [facility] from home. Pt c/o dizziness when he stands, pt did fall this AM when he stood up. Denies head injury or LOC but does take Plavix. Pt is A&Ox4 and NAD  HR 80  98% on RA 160

## 2022-10-24 NOTE — ED Notes (Signed)
The pt ambulated to the restroom without incident. The pt advised he did feel a lot better and his dizziness was much improved.

## 2022-10-24 NOTE — ED Provider Notes (Signed)
[facility] Provider Note    Event Date/Time   First MD Initiated Contact with Patient 10/24/22 1148     (approximate)   History   Dizziness   HPI  TERALD MILETO is a 72 y.o. male  here with dizziness. Pt reports that over the past day, he has had positional, significant dizziness. He states that when he got into bed he felt acute dizziness like the room was spinning around him. It resolved when he stayed still. When he got back up, it returned and he has had intermittent sensation of room spinning dizziness w/ standing and head position changes since onset. No tinnitus. No vision changes. No focal numbness or weakness. He has had no sx hit him when still. No h/o vertigo but does have h/o sinus issues and drainage. No other complaints. No recent falls.       Physical Exam   Triage Vital Signs: ED Triage Vitals  Enc Vitals Group     BP 10/24/22 1143 (!) 150/91     Pulse Rate 10/24/22 1143 (!) 55     Resp 10/24/22 1143 18     Temp 10/24/22 1143 98.2 F (36.8 C)     Temp Source 10/24/22 1143 Oral     SpO2 10/24/22 1143 99 %     Weight 10/24/22 1144 240 lb (108.9 kg)     Height 10/24/22 1144 5\' 7"  (1.702 m)     Head Circumference --      Peak Flow --      Pain Score 10/24/22 1144 0     Pain Loc --      Pain Edu? --      Excl. in GC? --     Most recent vital signs: Vitals:   10/24/22 1143  BP: (!) 150/91  Pulse: (!) 55  Resp: 18  Temp: 98.2 F (36.8 C)  SpO2: 99%     General: Awake, no distress.  CV:  Good peripheral perfusion.  Resp:  Normal work of breathing.  Abd:  No distention.  Other:  CNII-XII intact. Face sensation normal. Face is symmetric. EOMI and PERRL. No nystagmus at rest. OP clear, uvula elevates midline and tongue protrusion is midline. Strength 5/5 bilateral UE and LE. Normal sensation to light touch.    ED Results / Procedures / Treatments   Labs (all labs ordered are listed, but only abnormal results are  displayed) Labs Reviewed  CBC - Abnormal; Notable for the following components:      Result Value   RBC 3.65 (*)    Hemoglobin 11.0 (*)    HCT 34.8 (*)    Platelets 148 (*)    All other components within normal limits  BASIC METABOLIC PANEL - Abnormal; Notable for the following components:   Glucose, Bld 103 (*)    Calcium 8.2 (*)    All other components within normal limits     EKG Normal sinus rhythm, ventricular rate 56.  PR 142, QRS 188, QTc 472.  No acute ST elevations repress or acute evidence of acute ischemia or infarct.   RADIOLOGY CT head: No acute intracranial normality   I also independently reviewed and agree with radiologist interpretations.   PROCEDURES:  Critical Care performed: No   MEDICATIONS ORDERED IN ED: Medications  meclizine (ANTIVERT) tablet 25 mg (25 mg Oral Given 10/24/22 1241)     IMPRESSION / MDM / ASSESSMENT AND PLAN / ED COURSE  I reviewed the triage vital signs and  the nursing notes.                              Differential diagnosis includes, but is not limited to, peripheral vertigo, central vertigo, neuropathy, generalized weakness, deconditioning, anemia, medication effect  Patient's presentation is most consistent with acute presentation with potential threat to life or bodily function.  The patient is on the cardiac monitor to evaluate for evidence of arrhythmia and/or significant heart rate changes   72 year old male here with positional, easily fatigued vertigo.  Suspect peripheral vertigo.  He has no other focal neurological deficits or signs to suggest cerebellar or other stroke.  CT head is unremarkable.  His symptoms are reproducible with positional changes in the room, and fatigue easily.  He has unidirectional, mild nystagmus that has improved with meclizine and he is able to ambulate without difficulty.  He has had some sinus drainage which I suspect is contributing.  CBC and BMP are unremarkable.  Will start on Flonase  nasal spray, give meclizine as needed refer to ENT.  Return precautions given and discussed in detail with patient and his family.   FINAL CLINICAL IMPRESSION(S) / ED DIAGNOSES   Final diagnoses:  Vertigo     Rx / DC Orders   ED Discharge Orders          Ordered    meclizine (ANTIVERT) 12.5 MG tablet  3 times daily PRN        10/24/22 1411    fluticasone (FLONASE) 50 MCG/ACT nasal spray  Daily        10/24/22 1411             Note:  This document was prepared using Dragon voice recognition software and may include unintentional dictation errors.   Shaune Pollack, MD 10/24/22 [phone]

## 2022-10-24 NOTE — ED Triage Notes (Signed)
Patient reports dizziness starting around 2:30am. Reports the room was spinning and experienced some Nausea.   Denies fever or diarrhea  Takes plavix daily   Denies any injuries

## 2022-12-15 ENCOUNTER — Ambulatory Visit
Admission: RE | Admit: 2022-12-15 | Discharge: 2022-12-15 | Disposition: A | Discharge status: Home / Self Care | Payer: Medicare Other | Source: Ambulatory Visit | Attending: Family Medicine | Admitting: Family Medicine

## 2022-12-15 ENCOUNTER — Other Ambulatory Visit: Payer: Self-pay | Admitting: Family Medicine

## 2022-12-15 DIAGNOSIS — M79605 Pain in left leg: Secondary | ICD-10-CM | POA: Insufficient documentation

## 2022-12-15 DIAGNOSIS — M7989 Other specified soft tissue disorders: Secondary | ICD-10-CM | POA: Insufficient documentation

## 2023-04-11 ENCOUNTER — Other Ambulatory Visit (INDEPENDENT_AMBULATORY_CARE_PROVIDER_SITE_OTHER): Payer: Self-pay | Admitting: Urology

## 2023-04-11 ENCOUNTER — Other Ambulatory Visit: Payer: Self-pay | Admitting: Physician Assistant

## 2023-04-11 NOTE — Telephone Encounter (Signed)
Should patient continue this medication?

## 2023-04-25 ENCOUNTER — Other Ambulatory Visit (INDEPENDENT_AMBULATORY_CARE_PROVIDER_SITE_OTHER): Payer: Self-pay | Admitting: Urology

## 2023-05-24 ENCOUNTER — Encounter: Payer: Self-pay | Admitting: Urology

## 2023-05-24 NOTE — Telephone Encounter (Signed)
Should patient still be on this medication?

## 2023-10-17 ENCOUNTER — Ambulatory Visit
Admission: RE | Admit: 2023-10-17 | Discharge: 2023-10-17 | Disposition: A | Discharge status: Home / Self Care | Source: Ambulatory Visit | Attending: Urology | Admitting: Urology

## 2023-10-17 ENCOUNTER — Ambulatory Visit: Payer: Self-pay | Admitting: Urology

## 2023-10-17 ENCOUNTER — Encounter: Payer: Self-pay | Admitting: Urology

## 2023-10-17 VITALS — BP 103/66 | HR 58 | Ht 67.5 in | Wt 225.0 lb

## 2023-10-17 DIAGNOSIS — N200 Calculus of kidney: Secondary | ICD-10-CM

## 2023-10-17 DIAGNOSIS — R3129 Other microscopic hematuria: Secondary | ICD-10-CM

## 2023-10-17 NOTE — Patient Instructions (Signed)

## 2023-10-17 NOTE — Progress Notes (Signed)
 10/17/2023 4:17 PM   William Armstrong 06-26-1950 [id number]  Referring provider: Little Riff, MD [address] [facility] [place],  [place] [address]  Urological history: 1.  Nephrolithiasis - Stone composition of calcium  oxalate - CT renal stone study (2024) -1.5 right renal pelvic stone and nonobstructing bilateral intrarenal calculi - right URS (2024, 2018) - left ESWL  - left URS (2018)   2. Renal cyst  - RUS (2024) -multiple bilateral renal cysts  3. BPH with LU TS - PSA (08/2023) 0.43   Chief Complaint  Patient presents with   Follow-up   HPI: William Armstrong is a 73 y.o. male who presents today for 1 year follow-up.    Previous records reviewed.   KUB right lower pole stone 3 left renal stones  He has passed several stones since he was last seen by us .  He brings them in today.  Patient denies any modifying or aggravating factors.  Patient denies any recent UTI's, gross hematuria, dysuria or suprapubic/flank pain.  Patient denies any fevers, chills, nausea or vomiting.    PMH: Past Medical History:  Diagnosis Date   Anemia    Angina pectoris (HCC)    Aortic stenosis 02/14/2021   a.) TTE 02/14/2021: trivial AS (MPG 10.6)   Arthritis    BCC (basal cell carcinoma of skin)    BPH (benign prostatic hyperplasia)    Bradycardia    CAFL (chronic airflow limitation) (HCC)    CHF (congestive heart failure) (HCC)    a.) TTE 02/26/2012: EF 50%, mod LVH, post HHK, RVE, triv AR/MR, mild TR/PR, PASP 44; b.) TTE 10/11/2012: EF >55%, interm paradoxical sep motion, PASP 45; c.) TTE 03/18/2015: EF 40%, inf/post HK, mild panval regurg, PASP 40-45, G1DD; d.) TTE 02/14/2021: EF 50%, LVH, inf/inferosep HK, mod BAE, mod RVE, mild AR/TR, mod MR/PR, triv AS (MPG 10.6)   COPD (chronic obstructive pulmonary disease) (HCC)    Coronary artery disease    a.) LHC/PCI 02/26/2012: 99% mRCA (3.5 x 15 mm and 2.5 x 12 mm Integrity BMS); b.) LHC/PCI 10/11/2021: 99% oRCA, 80% ISR  pRCA --> faint L-R collateral formation --> DES x 2 (unknown type) placed   Depression    Glaucoma    H/O ventricular fibrillation 02/26/2012   a.) in setting of STEMI --> required defibrillation (360 J) x 3 + amiodarone gtt   History of kidney stones    HLD (hyperlipidemia)    HTN (hypertension)    Hypertension    Hypokalemia    Ischemic cardiomyopathy    a.) TTE 02/26/2012: EF 50%; b.) TTE 10/11/2012: EF >55%; c.) TTE 03/18/2015: EF 40%; d.) TTE 02/14/2021: EF 50%   Long term current use of antithrombotics/antiplatelets    a.) DAPT (ASA + clopidogrel )   Morbid obesity (HCC)    NSTEMI (non-ST elevated myocardial infarction) (HCC) 11/09/2019   a.) LHC/PCI 11/10/2019: 5% mRCA, 99% ISR pRCA-1 (3.5 x 22 mm Resolute Onxy DES), 15% pRCA-2, 50% oRCA, 100% RPDA.   OSA (obstructive sleep apnea)    a.) no longer requires nocturnal s/p ~250 lb weight loss following bariatric surgery   Pulmonary HTN (HCC) 02/26/2012   a.) TTE 02/26/2012: PASP 44; b.) TTE 10/11/2012: PASP 45; c.) TTE 03/18/2015: PASP 40-45   RBBB (right bundle branch block)    STEMI (ST elevation myocardial infarction) (HCC) 02/26/2012   a.) LHC/PCI 02/26/2012 --> 99% mRCA (3.5 x 15 mm and 3.5 x 12 mm Integrity BMS); b.) developed AVIR on catheterization  table --> decompsensated to VT requiring defibrillation x 3, which achieved ROSC. Treated with amiodarone gtt.    Surgical History: Past Surgical History:  Procedure Laterality Date   CARDIAC CATHETERIZATION     CORONARY ANGIOPLASTY     CORONARY STENT INTERVENTION N/A 11/10/2019   Procedure: CORONARY STENT INTERVENTION;  Surgeon: Percival Brace, MD;  Location: [facility];  Service: Cardiovascular;  Laterality: N/A;   CORONARY STENT PLACEMENT  02/2012   CYSTOSCOPY W/ RETROGRADES Left 11/18/2016   Procedure: CYSTOSCOPY WITH RETROGRADE PYELOGRAM;  Surgeon: Homero Luster, MD;  Location: [facility];  Service: Urology;  Laterality: Left;   CYSTOSCOPY WITH STENT  PLACEMENT Left 11/18/2016   Procedure: CYSTOSCOPY WITH STENT PLACEMENT;  Surgeon: Homero Luster, MD;  Location: [facility];  Service: Urology;  Laterality: Left;   CYSTOSCOPY/URETEROSCOPY/HOLMIUM LASER/STENT PLACEMENT Left 11/29/2016   Procedure: CYSTOSCOPY/URETEROSCOPY/HOLMIUM LASER/STENT EXCHANGE;  Surgeon: Dustin Gimenez, MD;  Location: [facility];  Service: Urology;  Laterality: Left;   CYSTOSCOPY/URETEROSCOPY/HOLMIUM LASER/STENT PLACEMENT Right 03/07/2017   Procedure: CYSTOSCOPY/URETEROSCOPY/HOLMIUM LASER/STENT PLACEMENT;  Surgeon: Dustin Gimenez, MD;  Location: [facility];  Service: Urology;  Laterality: Right;   CYSTOSCOPY/URETEROSCOPY/HOLMIUM LASER/STENT PLACEMENT Right 08/07/2022   Procedure: CYSTOSCOPY/URETEROSCOPY/HOLMIUM LASER/STENT PLACEMENT;  Surgeon: Dustin Gimenez, MD;  Location: [facility];  Service: Urology;  Laterality: Right;   EXTRACORPOREAL SHOCK WAVE LITHOTRIPSY Left 02/03/2021   Procedure: EXTRACORPOREAL SHOCK WAVE LITHOTRIPSY (ESWL);  Surgeon: Dustin Gimenez, MD;  Location: [facility];  Service: Urology;  Laterality: Left;   EXTRACORPOREAL SHOCK WAVE LITHOTRIPSY Right 06/22/2022   Procedure: EXTRACORPOREAL SHOCK WAVE LITHOTRIPSY (ESWL);  Surgeon: Geraline Knapp, MD;  Location: [facility];  Service: Urology;  Laterality: Right;   JOINT REPLACEMENT     right hip   LAPAROSCOPIC GASTRIC RESTRICTIVE DUODENAL PROCEDURE (DUODENAL SWITCH)     w/hiatal hernia repair   LAPAROSCOPIC GASTRIC SLEEVE RESECTION  08/21/2013   LEFT HEART CATH AND CORONARY ANGIOGRAPHY N/A 11/10/2019   Procedure: LEFT HEART CATH AND CORONARY ANGIOGRAPHY;  Surgeon: Ronney Cola, MD;  Location: [facility];  Service: Cardiovascular;  Laterality: N/A;   skin removal surgery     removal of extra skin approx 20 lbs   STONE EXTRACTION WITH BASKET Right 03/07/2017   Procedure: STONE EXTRACTION WITH BASKET;  Surgeon: Dustin Gimenez, MD;  Location: [facility];  Service: Urology;  Laterality: Right;    TONSILLECTOMY AND ADENOIDECTOMY  1960   TOTAL HIP ARTHROPLASTY Right 01/18/2017   Procedure: TOTAL HIP ARTHROPLASTY ANTERIOR APPROACH;  Surgeon: Molli Angelucci, MD;  Location: [facility];  Service: Orthopedics;  Laterality: Right;    Home Medications:  Allergies as of 10/17/2023       Reactions   Atorvastatin Other (See Comments)   Muscle aches. Other reaction(s): Other (See Comments) Muscle aches. Muscle pain   Gabapentin  Rash   Pregabalin Other (See Comments)   Muscle soreness Other reaction(s): Other (See Comments) Muscle soreness Severe muscle pain        Medication List        Accurate as of Oct 17, 2023  4:17 PM. If you have any questions, ask your nurse or doctor.          STOP taking these medications    lisinopril  40 MG tablet Commonly known as: ZESTRIL        TAKE these medications    aspirin  EC 81 MG tablet Take 81 mg by mouth daily.   clopidogrel  75 MG tablet Commonly known as: PLAVIX  Take 75 mg by mouth daily.   ferrous  sulfate 325 (65 FE) MG EC tablet Take 325 mg by mouth daily with breakfast.   fluticasone  50 MCG/ACT nasal spray Commonly known as: FLONASE  Place 2 sprays into both nostrils daily.   latanoprost  0.005 % ophthalmic solution Commonly known as: XALATAN  Place 1 drop into both eyes at bedtime.   meclizine  12.5 MG tablet Commonly known as: ANTIVERT  Take 1 tablet (12.5 mg total) by mouth 3 (three) times daily as needed for dizziness.   nitroGLYCERIN  0.4 MG SL tablet Commonly known as: NITROSTAT  Place 0.4 mg under the tongue every 5 (five) minutes as needed for chest pain.   rosuvastatin  40 MG tablet Commonly known as: CRESTOR  Take 1 tablet by mouth daily.   sertraline  100 MG tablet Commonly known as: ZOLOFT  Take 1 tablet (100 mg total) by mouth daily.   tamsulosin  0.4 MG Caps capsule Commonly known as: Flomax  Take 1 capsule (0.4 mg total) by mouth daily.   Vitamin D  50 MCG (2000 UT) Caps Take 2,000 Units by mouth  daily.        Allergies:  Allergies  Allergen Reactions   Atorvastatin Other (See Comments)    Muscle aches. Other reaction(s): Other (See Comments) Muscle aches. Muscle pain   Gabapentin  Rash   Pregabalin Other (See Comments)    Muscle soreness Other reaction(s): Other (See Comments) Muscle soreness Severe muscle pain    Family History: Family History  Problem Relation Age of Onset   Heart attack Mother    Brain cancer Father    Heart attack Sister    Congenital heart disease Sister    Leukemia Paternal Grandmother    COPD Brother    Heart disease Brother    Prostate cancer Neg Hx    Kidney cancer Neg Hx    Bladder Cancer Neg Hx     Social History:  reports that he has never smoked. He has never been exposed to tobacco smoke. He has never used smokeless tobacco. He reports that he does not drink alcohol and does not use drugs.  ROS: Pertinent ROS in HPI  Physical Exam: BP 103/66   Pulse (!) 58   Ht 5' 7.5" (1.715 m)   Wt 225 lb (102.1 kg)   BMI 34.72 kg/m   Constitutional:  Well nourished. Alert and oriented, No acute distress. HEENT: [place] AT, moist mucus membranes.  Trachea midline Cardiovascular: No clubbing, cyanosis, or edema. Respiratory: Normal respiratory effort, no increased work of breathing. Neurologic: Grossly intact, no focal deficits, moving all 4 extremities. Psychiatric: Normal mood and affect.  Laboratory Data: Comprehensive Metabolic Panel (CMP) Order: [id number] Component Ref Range & Units 1 mo ago  Glucose 70 - 110 mg/dL 80  Sodium 409 - 811 mmol/L 137  Potassium 3.6 - 5.1 mmol/L 4.8  Chloride 97 - 109 mmol/L 106  Carbon Dioxide (CO2) 22.0 - 32.0 mmol/L 24.7  Urea Nitrogen (BUN) 7 - 25 mg/dL 31 High   Creatinine 0.7 - 1.3 mg/dL 1  Glomerular Filtration Rate (eGFR) >60 mL/min/1.73sq m 78  Comment: CKD-EPI (2021) does not include patient's race in the calculation of eGFR.  Monitoring changes of plasma creatinine and eGFR over  time is useful for monitoring kidney function.  Interpretive Ranges for eGFR (CKD-EPI 2021):  eGFR:       >60 mL/min/1.73 sq. m - Normal eGFR:       30-59 mL/min/1.73 sq. m - Moderately Decreased eGFR:       15-29 mL/min/1.73 sq. m  - Severely Decreased eGFR:       <  15 mL/min/1.73 sq. m  - Kidney Failure   Note: These eGFR calculations do not apply in acute situations when eGFR is changing rapidly or patients on dialysis.  Calcium  8.7 - 10.3 mg/dL 8.8  AST 8 - 39 U/L 40 High   ALT 6 - 57 U/L 45  Alk Phos (alkaline Phosphatase) 34 - 104 U/L 84  Albumin 3.5 - 4.8 g/dL 4  Bilirubin, Total 0.3 - 1.2 mg/dL 0.6  Protein, Total 6.1 - 7.9 g/dL 6.2  A/G Ratio 1.0 - 5.0 gm/dL 1.8  Resulting Agency [facility]   Specimen Collected: 08/20/23 09:16   Performed by: [facility] Last Resulted: 08/20/23 13:45  Received From: [facility]  Result Received: 10/16/23 15:18   CBC w/auto Differential (5 Part) Order: [id number] Component Ref Range & Units 1 mo ago  WBC (White Blood Cell Count) 4.1 - 10.2 10^3/uL 7.9  RBC (Red Blood Cell Count) 4.69 - 6.13 10^6/uL 3.68 Low   Hemoglobin 14.1 - 18.1 gm/dL 86.5 Low   Hematocrit 78.4 - 52.0 % 34.6 Low   MCV (Mean Corpuscular Volume) 80.0 - 100.0 fl 94  MCH (Mean Corpuscular Hemoglobin) 27.0 - 31.2 pg 30.7  MCHC (Mean Corpuscular Hemoglobin Concentration) 32.0 - 36.0 gm/dL 69.6  Platelet Count 295 - 450 10^3/uL 178  RDW-CV (Red Cell Distribution Width) 11.6 - 14.8 % 14.1  MPV (Mean Platelet Volume) 9.4 - 12.4 fl 9.5  Neutrophils 1.50 - 7.80 10^3/uL 4.24  Lymphocytes 1.00 - 3.60 10^3/uL 2.73  Monocytes 0.00 - 1.50 10^3/uL 0.62  Eosinophils 0.00 - 0.55 10^3/uL 0.18  Basophils 0.00 - 0.09 10^3/uL 0.04  Neutrophil % 32.0 - 70.0 % 54  Lymphocyte % 10.0 - 50.0 % 34.8  Monocyte % 4.0 - 13.0 % 7.9  Eosinophil % 1.0 - 5.0 % 2.3  Basophil% 0.0 - 2.0 % 0.5  Immature Granulocyte  % <=0.7 % 0.5  Immature Granulocyte Count <=0.06 10^3/L 0.04  Resulting Agency [facility]   Specimen Collected: 08/20/23 09:16   Performed by: [facility] Last Resulted: 08/20/23 09:32  Received From: [facility]  Result Received: 10/16/23 15:18    PSA, Total (Screen) Order: [id number] Component Ref Range & Units 1 mo ago  PSA (Prostate Specific Antigen), Total 0.10 - 4.00 ng/mL 0.43  Resulting Agency [facility]  Narrative Performed by [facility] - [facility] Test results were determined with Beckman Coulter Hybritech Assay. Values obtained with different assay methods cannot be used interchangeably in serial testing. Assay results should not be interpreted as absolute evidence of the presence or absence of malignant disease  Specimen Collected: 08/20/23 09:16   Performed by: [facility] Last Resulted: 08/20/23 13:42  Received From: [facility]  Result Received: 10/16/23 15:18    Urinalysis w/Microscopic Order: [id number] Component Ref Range & Units 1 mo ago  Color Colorless, Straw, Light Yellow, Yellow, Dark Yellow Yellow  Clarity Clear Clear  Specific Gravity 1.005 - 1.030 1.014  pH, Urine 5.0 - 8.0 5  Protein, Urinalysis Negative mg/dL Negative  Glucose, Urinalysis Negative mg/dL Negative  Ketones, Urinalysis Negative mg/dL Negative  Blood, Urinalysis Negative 1+ Abnormal   Nitrite, Urinalysis Negative Negative  Leukocyte Esterase, Urinalysis Negative Negative  Bilirubin, Urinalysis Negative Negative  Urobilinogen, Urinalysis 0.2 - 1.0 mg/dL 0.2  WBC, UA <=5 /hpf 0  Red Blood Cells, Urinalysis <=3 /hpf 10 High  Bacteria, Urinalysis 0 - 5 /hpf 0-5  Squamous Epithelial Cells, Urinalysis /hpf 0  Calcium  Oxalate Crystals None Seen PRESENT Abnormal   Resulting Agency [facility]   Specimen Collected: 08/20/23 09:16   Performed by:  [facility] Last Resulted: 08/20/23 11:31  Received From: [facility]  Result Received: 10/16/23 15:18    Urinalysis See EPIC and HPI  I have reviewed the labs.   Pertinent Imaging: KUB bilateral nephrolithiasis  I have independently reviewed the films.  Radiologist interpretation still pending.  Assessment & Plan:    1.  Bilateral nephrolithiasis -We discussed repeating a Litholink and he is interested -Will go ahead and get the kit ordered and sent to him  2. Microscopic hematuria - UA from March with microscopic hematuria, but is likely due to passage of fragments - He had upper tract imaging, and ureteroscopy last year and no worrisome findings were discovered - We will continue to monitor  Return for pending Litholink results .  These notes generated with voice recognition software. I apologize for typographical errors.  Briant Camper  [facility] [address]  [address] [place], [place] [address] [phone]

## 2023-10-17 NOTE — Addendum Note (Signed)
 Addended by: Darlen Eglin on: 10/17/2023 04:22 PM   Modules accepted: Orders

## 2023-10-24 ENCOUNTER — Other Ambulatory Visit

## 2023-10-24 DIAGNOSIS — N200 Calculus of kidney: Secondary | ICD-10-CM

## 2023-10-24 DIAGNOSIS — R3129 Other microscopic hematuria: Secondary | ICD-10-CM

## 2023-10-25 LAB — LITHOLINK SERUM PANEL
CO2: 25 mmol/L (ref 20–29)
Calcium: 8.8 mg/dL (ref 8.6–10.2)
Chloride: 106 mmol/L (ref 96–106)
Creatinine, Ser: 0.78 mg/dL (ref 0.76–1.27)
Magnesium: 1.8 mg/dL (ref 1.6–2.3)
Phosphorus: 3.8 mg/dL (ref 2.8–4.1)
Potassium: 4.2 mmol/L (ref 3.5–5.2)
Sodium: 143 mmol/L (ref 134–144)
Uric Acid: 6.2 mg/dL (ref 3.8–8.4)
eGFR: 93 mL/min/{1.73_m2} (ref >59)

## 2023-10-31 LAB — LITHOLINK 24HR URINE PANEL
Ammonium, Urine: 53 mmol/(24.h) (ref 15–60)
Calcium Oxalate Saturation: 8.35 (ref 6.00–10.00)
Calcium Phosphate Saturation: 0.12 — ABNORMAL LOW (ref 0.50–2.00)
Calcium, Urine: 120 mg/(24.h) (ref <250)
Calcium/Creatinine Ratio: 115 mg/g{creat} (ref 34–196)
Calcium/Kg Body Weight: 1.2 mg/kg/d (ref <4.0)
Chloride, Urine: 182 mmol/(24.h) (ref 70–250)
Citrate, Urine: 80 mg/(24.h) — ABNORMAL LOW (ref >450)
Creatinine, Urine: 1051 mg/(24.h)
Creatinine/Kg Body Weight: 10.3 mg/kg/d — ABNORMAL LOW (ref 11.9–24.4)
Magnesium, Urine: 86 mg/(24.h) (ref 30–120)
Oxalate, Urine: 50 mg/(24.h) — ABNORMAL HIGH (ref 20–40)
Phosphorus, Urine: 1019 mg/(24.h) (ref 600–1200)
Potassium, Urine: 37 mmol/(24.h) (ref 20–100)
Protein Catabolic Rate: 0.6 g/kg/d — ABNORMAL LOW (ref 0.8–1.4)
Sodium, Urine: 151 mmol/(24.h) — ABNORMAL HIGH (ref 50–150)
Sulfate, Urine: 21 meq/(24.h) (ref 20–80)
Urea Nitrogen, Urine: 6.28 g/(24.h) (ref 6.00–14.00)
Uric Acid Saturation: 2.7 — ABNORMAL HIGH (ref <1.00)
Uric Acid, Urine: 509 mg/(24.h) (ref <800)
Urine Volume (Preserved): 1460 mL/(24.h) (ref 500–4000)
pH, 24 hr, Urine: 4.98 — ABNORMAL LOW (ref 5.800–6.200)

## 2023-11-02 ENCOUNTER — Ambulatory Visit: Payer: Self-pay | Admitting: Urology

## 2023-12-04 ENCOUNTER — Telehealth: Payer: Self-pay | Admitting: Urology

## 2023-12-04 NOTE — Telephone Encounter (Signed)
 He never read his [employer].  Would you call him and read this to him?  William Armstrong, Your 24-hour metabolic workup indicates that you do not have enough fluid intake.  If you are not on fluid restrictions, it is recommended that you drink enough fluid to have 10 cups of urine produced daily.  I would recommend measuring urinary output using an urinal until you reach this parameter and then you will have a general idea of how much fluid it takes to achieve this output.  That way, you do not have to measure your output on a continuous basis.  It also shows that your urine oxalate excretion is elevated and to address this to take calcium  carbonate which can be achieved taking one TUMS daily.  Your urine citrate is also low and they are recommending potassium citrate  to help with this.   The side effects of potassium citrate  are stomach discomfort, nausea, vomiting, and diarrhea.  Serious side effects are rare but can include high potassium levels (hyperkalemia), intestinal bleeding or narrowing, and severe allergic reactions.  It's important to be aware of the risk of stomach lining damage, especially if you take certain other medications like NSAID.  If you decide to start potassium citrate , we will need to do blood work and check the pH of your urine every 4 months.   It is also recommended to repeat your metabolic workup in 6 weeks if you implement any of these suggestions.  If you have any other concerns or comments, please let me know.  If you would like to start the potassium citrate , please let me know what pharmacy would like for me to send the medication.  I will wait on your reply. William Demetriou, PA-C

## 2023-12-05 NOTE — Telephone Encounter (Signed)
 Called patient no answer left voicemail

## 2024-06-09 ENCOUNTER — Emergency Department
Admission: EM | Admit: 2024-06-09 | Discharge: 2024-06-09 | Disposition: A | Discharge status: Home / Self Care | Attending: Emergency Medicine | Admitting: Emergency Medicine

## 2024-06-09 ENCOUNTER — Emergency Department

## 2024-06-09 ENCOUNTER — Other Ambulatory Visit: Payer: Self-pay

## 2024-06-09 DIAGNOSIS — I110 Hypertensive heart disease with heart failure: Secondary | ICD-10-CM | POA: Diagnosis not present

## 2024-06-09 DIAGNOSIS — J449 Chronic obstructive pulmonary disease, unspecified: Secondary | ICD-10-CM | POA: Insufficient documentation

## 2024-06-09 DIAGNOSIS — I2510 Atherosclerotic heart disease of native coronary artery without angina pectoris: Secondary | ICD-10-CM | POA: Insufficient documentation

## 2024-06-09 DIAGNOSIS — N132 Hydronephrosis with renal and ureteral calculous obstruction: Secondary | ICD-10-CM | POA: Insufficient documentation

## 2024-06-09 DIAGNOSIS — R10A0 Flank pain, unspecified side: Secondary | ICD-10-CM | POA: Diagnosis present

## 2024-06-09 DIAGNOSIS — I509 Heart failure, unspecified: Secondary | ICD-10-CM | POA: Insufficient documentation

## 2024-06-09 DIAGNOSIS — N200 Calculus of kidney: Secondary | ICD-10-CM

## 2024-06-09 LAB — COMPREHENSIVE METABOLIC PANEL WITH GFR
ALT: 46 U/L — ABNORMAL HIGH (ref 0–44)
AST: 49 U/L — ABNORMAL HIGH (ref 15–41)
Albumin: 4.2 g/dL (ref 3.5–5.0)
Alkaline Phosphatase: 104 U/L (ref 38–126)
Anion gap: 12 (ref 5–15)
BUN: 18 mg/dL (ref 8–23)
CO2: 23 mmol/L (ref 22–32)
Calcium: 8.9 mg/dL (ref 8.9–10.3)
Chloride: 103 mmol/L (ref 98–111)
Creatinine, Ser: 0.88 mg/dL (ref 0.61–1.24)
GFR, Estimated: >60 mL/min
Glucose, Bld: 112 mg/dL — ABNORMAL HIGH (ref 70–99)
Potassium: 4.7 mmol/L (ref 3.5–5.1)
Sodium: 138 mmol/L (ref 135–145)
Total Bilirubin: 0.6 mg/dL (ref 0.0–1.2)
Total Protein: 6.9 g/dL (ref 6.5–8.1)

## 2024-06-09 LAB — CBC
HCT: 43.4 % (ref 39.0–52.0)
Hemoglobin: 13.9 g/dL (ref 13.0–17.0)
MCH: 30.5 pg (ref 26.0–34.0)
MCHC: 32 g/dL (ref 30.0–36.0)
MCV: 95.4 fL (ref 80.0–100.0)
Platelets: 179 K/uL (ref 150–400)
RBC: 4.55 MIL/uL (ref 4.22–5.81)
RDW: 13 % (ref 11.5–15.5)
WBC: 9.2 K/uL (ref 4.0–10.5)
nRBC: 0 % (ref 0.0–0.2)

## 2024-06-09 LAB — URINALYSIS, ROUTINE W REFLEX MICROSCOPIC
Bilirubin Urine: NEGATIVE
Glucose, UA: NEGATIVE mg/dL
Ketones, ur: NEGATIVE mg/dL
Leukocytes,Ua: NEGATIVE
Nitrite: NEGATIVE
Protein, ur: NEGATIVE mg/dL
RBC / HPF: >50 RBC/hpf (ref 0–5)
Specific Gravity, Urine: 1.014 (ref 1.005–1.030)
Squamous Epithelial / HPF: 0 /HPF (ref 0–5)
pH: 5 (ref 5.0–8.0)

## 2024-06-09 LAB — LIPASE, BLOOD: Lipase: 24 U/L (ref 11–51)

## 2024-06-09 MED ORDER — OXYCODONE-ACETAMINOPHEN 5-325 MG PO TABS
1.0000 | ORAL_TABLET | Freq: Once | ORAL | Status: AC
  Administered 2024-06-09: 1 via ORAL
  Filled 2024-06-09: qty 1

## 2024-06-09 MED ORDER — OXYCODONE-ACETAMINOPHEN 5-325 MG PO TABS: 1.0000 | ORAL_TABLET | ORAL | 0 refills | Status: AC | PRN

## 2024-06-09 NOTE — ED Triage Notes (Signed)
 Pt to ED via POV from home. Pt reports left lower back pain that started today. Pt reports has passed 15 kidney stones in the last 2wks. Pt reports some bleeding with urination. Some nausea.

## 2024-06-09 NOTE — ED Provider Notes (Signed)
 "  [facility] Provider Note    Event Date/Time   First MD Initiated Contact with Patient 06/09/24 2111     (approximate)   History   Chief Complaint Back Pain   HPI  William Armstrong is a 74 y.o. male with past medical history of hypertension, CAD, CHF, COPD, and kidney stones who presents to the ED complaining of flank pain.  Patient reports that he had acute onset left flank pain around 4:00 this morning.  It has been associated with nausea but he denies any vomiting and has not had any changes in his bowel movements.  He describes similar pain in the past with kidney stones but denies any fevers, dysuria, or hematuria.  He has not taken anything for his symptoms prior to arrival.     Physical Exam   Triage Vital Signs: ED Triage Vitals  Encounter Vitals Group     BP 06/09/24 1822 (!) 181/84     Girls Systolic BP Percentile --      Girls Diastolic BP Percentile --      Boys Systolic BP Percentile --      Boys Diastolic BP Percentile --      Pulse Rate 06/09/24 1822 68     Resp 06/09/24 1822 18     Temp 06/09/24 1822 97.6 F (36.4 C)     Temp Source 06/09/24 1822 Oral     SpO2 06/09/24 1822 98 %     Weight --      Height --      Head Circumference --      Peak Flow --      Pain Score 06/09/24 1823 8     Pain Loc --      Pain Education --      Exclude from Growth Chart --     Most recent vital signs: Vitals:   06/09/24 1822  BP: (!) 181/84  Pulse: 68  Resp: 18  Temp: 97.6 F (36.4 C)  SpO2: 98%    Constitutional: Alert and oriented. Eyes: Conjunctivae are normal. Head: Atraumatic. Nose: No congestion/rhinnorhea. Mouth/Throat: Mucous membranes are moist.  Cardiovascular: Normal rate, regular rhythm. Grossly normal heart sounds.  2+ radial pulses bilaterally. Respiratory: Normal respiratory effort.  No retractions. Lungs CTAB. Gastrointestinal: Soft and nontender. No distention.  No CVA tenderness bilaterally. Musculoskeletal: No  lower extremity tenderness nor edema.  Neurologic:  Normal speech and language. No gross focal neurologic deficits are appreciated.    ED Results / Procedures / Treatments   Labs (all labs ordered are listed, but only abnormal results are displayed) Labs Reviewed  COMPREHENSIVE METABOLIC PANEL WITH GFR - Abnormal; Notable for the following components:      Result Value   Glucose, Bld 112 (*)    AST 49 (*)    ALT 46 (*)    All other components within normal limits  URINALYSIS, ROUTINE W REFLEX MICROSCOPIC - Abnormal; Notable for the following components:   Color, Urine YELLOW (*)    APPearance HAZY (*)    Hgb urine dipstick LARGE (*)    Bacteria, UA RARE (*)    All other components within normal limits  LIPASE, BLOOD  CBC    RADIOLOGY CT renal protocol reviewed and interpreted by me with ureteral stone and associated hydronephrosis, no inflammatory changes noted.  PROCEDURES:  Critical Care performed: No  Procedures   MEDICATIONS ORDERED IN ED: Medications  oxyCODONE -acetaminophen  (PERCOCET/ROXICET) 5-325 MG per tablet 1 tablet (1 tablet Oral  Given 06/09/24 2220)     IMPRESSION / MDM / ASSESSMENT AND PLAN / ED COURSE  I reviewed the triage vital signs and the nursing notes.                              74 y.o. male with past medical history of hypertension, CAD, CHF, COPD, and kidney stones who presents to the ED complaining of acute onset left flank pain earlier this morning.  Patient's presentation is most consistent with acute presentation with potential threat to life or bodily function.  Differential diagnosis includes, but is not limited to, kidney stone, pyelonephritis, lumbar strain.  Patient well-appearing and in no acute distress, vital signs remarkable for hypertension but otherwise reassuring.  Labs without significant anemia, leukocytosis, electrolyte abnormality, or AKI.  LFTs are unremarkable and urinalysis shows no signs of infection.  CT imaging does  show stone in the distal left ureter with associated hydronephrosis.  Patient's pain well-controlled following dose of Percocet and he is tolerating oral intake without difficulty.  He is appropriate for outpatient management with urology follow-up as needed, referral provided.  He was counseled to return to the ED for new or worsening symptoms, patient agrees with plan.      FINAL CLINICAL IMPRESSION(S) / ED DIAGNOSES   Final diagnoses:  Kidney stone     Rx / DC Orders   ED Discharge Orders          Ordered    oxyCODONE -acetaminophen  (PERCOCET) 5-325 MG tablet  Every 4 hours PRN        06/09/24 2213             Note:  This document was prepared using Dragon voice recognition software and may include unintentional dictation errors.   Willo Dunnings, MD 06/09/24 2320  "

## 2024-06-10 ENCOUNTER — Ambulatory Visit
Admission: RE | Admit: 2024-06-10 | Discharge: 2024-06-10 | Disposition: A | Source: Ambulatory Visit | Attending: Urology | Admitting: Urology

## 2024-06-10 ENCOUNTER — Ambulatory Visit
Admission: RE | Admit: 2024-06-10 | Discharge: 2024-06-10 | Disposition: A | Discharge status: Home / Self Care | Source: Ambulatory Visit | Attending: Urology | Admitting: Urology

## 2024-06-10 ENCOUNTER — Other Ambulatory Visit: Payer: Self-pay | Admitting: Urology

## 2024-06-10 DIAGNOSIS — N200 Calculus of kidney: Secondary | ICD-10-CM

## 2024-06-10 NOTE — Progress Notes (Unsigned)
 "    06/11/2024 8:43 AM   William Armstrong [birth year] [id number]  Referring provider: Rudolpho Norleen BIRCH, MD [address] [facility] [place],  [place] [address]  Urological history: 1.  Nephrolithiasis - Stone composition of calcium  oxalate - CT renal stone study (2024) -1.5 right renal pelvic stone and nonobstructing bilateral intrarenal calculi - right URS (2024, 2018) - left ESWL  - left URS (2018)   2. Renal cyst  - RUS (2024) -multiple bilateral renal cysts  3. BPH with LU TS - PSA (08/2023) 0.43  Chief Complaint  Patient presents with   Nephrolithiasis   HPI: William Armstrong is a 74 y.o. male who presents today for kidney stone.    Previous records reviewed.   He presented to the emergency department on January 5 for symptoms of left flank pain.  CT renal stone study noted a 7 mm distal left ureteral calculus with hydroureteronephrosis along with multiple small nonobstructing right renal calculi and left renal calculi.  Serum creatinine 0.88, CBC normal, and UA with micro heme.  He brings a bag of over 10 stones that he has passed over the last 6 weeks.  The largest being ~ 7 mm.  Since he has been to the emergency room, he has not had any discomfort.  Patient denies any modifying or aggravating factors.  Patient denies any recent UTI's, gross hematuria, dysuria or suprapubic/flank pain.  Patient denies any fevers, chills, nausea or vomiting.    UA yellow clear, specific gravity 1.025, pH 5.5, 3+ heme, 0-5 WBCs, greater than 30 RBCs, 0-10 epithelial cells, calcium  oxalate crystals present and a few bacteria.    KUB there appears to be two stones in the distal left ureter vs one 7 mm stone  PMH: Past Medical History:  Diagnosis Date   Anemia    Angina pectoris    Aortic stenosis 02/14/2021   a.) TTE 02/14/2021: trivial AS (MPG 10.6)   Arthritis    BCC (basal cell carcinoma of skin)    BPH (benign prostatic hyperplasia)    Bradycardia    CAFL (chronic airflow  limitation) (HCC)    CHF (congestive heart failure) (HCC)    a.) TTE 02/26/2012: EF 50%, mod LVH, post HHK, RVE, triv AR/MR, mild TR/PR, PASP 44; b.) TTE 10/11/2012: EF >55%, interm paradoxical sep motion, PASP 45; c.) TTE 03/18/2015: EF 40%, inf/post HK, mild panval regurg, PASP 40-45, G1DD; d.) TTE 02/14/2021: EF 50%, LVH, inf/inferosep HK, mod BAE, mod RVE, mild AR/TR, mod MR/PR, triv AS (MPG 10.6)   COPD (chronic obstructive pulmonary disease) (HCC)    Coronary artery disease    a.) LHC/PCI 02/26/2012: 99% mRCA (3.5 x 15 mm and 2.5 x 12 mm Integrity BMS); b.) LHC/PCI 10/11/2021: 99% oRCA, 80% ISR pRCA --> faint L-R collateral formation --> DES x 2 (unknown type) placed   Depression    Glaucoma    H/O ventricular fibrillation 02/26/2012   a.) in setting of STEMI --> required defibrillation (360 J) x 3 + amiodarone gtt   History of kidney stones    HLD (hyperlipidemia)    HTN (hypertension)    Hypertension    Hypokalemia    Ischemic cardiomyopathy    a.) TTE 02/26/2012: EF 50%; b.) TTE 10/11/2012: EF >55%; c.) TTE 03/18/2015: EF 40%; d.) TTE 02/14/2021: EF 50%   Long term current use of antithrombotics/antiplatelets    a.) DAPT (ASA + clopidogrel )   Morbid obesity (HCC)    NSTEMI (non-ST elevated myocardial  infarction) (HCC) 11/09/2019   a.) LHC/PCI 11/10/2019: 5% mRCA, 99% ISR pRCA-1 (3.5 x 22 mm Resolute Onxy DES), 15% pRCA-2, 50% oRCA, 100% RPDA.   OSA (obstructive sleep apnea)    a.) no longer requires nocturnal s/p ~250 lb weight loss following bariatric surgery   Pulmonary HTN (HCC) 02/26/2012   a.) TTE 02/26/2012: PASP 44; b.) TTE 10/11/2012: PASP 45; c.) TTE 03/18/2015: PASP 40-45   RBBB (right bundle branch block)    STEMI (ST elevation myocardial infarction) (HCC) 02/26/2012   a.) LHC/PCI 02/26/2012 --> 99% mRCA (3.5 x 15 mm and 3.5 x 12 mm Integrity BMS); b.) developed AVIR on catheterization table --> decompsensated to VT requiring defibrillation x 3, which achieved ROSC.  Treated with amiodarone gtt.    Surgical History: Past Surgical History:  Procedure Laterality Date   CARDIAC CATHETERIZATION     CORONARY ANGIOPLASTY     CORONARY STENT INTERVENTION N/A 11/10/2019   Procedure: CORONARY STENT INTERVENTION;  Surgeon: Ammon Blunt, MD;  Location: [facility];  Service: Cardiovascular;  Laterality: N/A;   CORONARY STENT PLACEMENT  02/2012   CYSTOSCOPY W/ RETROGRADES Left 11/18/2016   Procedure: CYSTOSCOPY WITH RETROGRADE PYELOGRAM;  Surgeon: Watt Rush, MD;  Location: [facility];  Service: Urology;  Laterality: Left;   CYSTOSCOPY WITH STENT PLACEMENT Left 11/18/2016   Procedure: CYSTOSCOPY WITH STENT PLACEMENT;  Surgeon: Watt Rush, MD;  Location: [facility];  Service: Urology;  Laterality: Left;   CYSTOSCOPY/URETEROSCOPY/HOLMIUM LASER/STENT PLACEMENT Left 11/29/2016   Procedure: CYSTOSCOPY/URETEROSCOPY/HOLMIUM LASER/STENT EXCHANGE;  Surgeon: Penne Knee, MD;  Location: [facility];  Service: Urology;  Laterality: Left;   CYSTOSCOPY/URETEROSCOPY/HOLMIUM LASER/STENT PLACEMENT Right 03/07/2017   Procedure: CYSTOSCOPY/URETEROSCOPY/HOLMIUM LASER/STENT PLACEMENT;  Surgeon: Penne Knee, MD;  Location: [facility];  Service: Urology;  Laterality: Right;   CYSTOSCOPY/URETEROSCOPY/HOLMIUM LASER/STENT PLACEMENT Right 08/07/2022   Procedure: CYSTOSCOPY/URETEROSCOPY/HOLMIUM LASER/STENT PLACEMENT;  Surgeon: Penne Knee, MD;  Location: [facility];  Service: Urology;  Laterality: Right;   EXTRACORPOREAL SHOCK WAVE LITHOTRIPSY Left 02/03/2021   Procedure: EXTRACORPOREAL SHOCK WAVE LITHOTRIPSY (ESWL);  Surgeon: Penne Knee, MD;  Location: [facility];  Service: Urology;  Laterality: Left;   EXTRACORPOREAL SHOCK WAVE LITHOTRIPSY Right 06/22/2022   Procedure: EXTRACORPOREAL SHOCK WAVE LITHOTRIPSY (ESWL);  Surgeon: Twylla Glendia BROCKS, MD;  Location: [facility];  Service: Urology;  Laterality: Right;   JOINT REPLACEMENT     right hip   LAPAROSCOPIC GASTRIC  RESTRICTIVE DUODENAL PROCEDURE (DUODENAL SWITCH)     w/hiatal hernia repair   LAPAROSCOPIC GASTRIC SLEEVE RESECTION  08/21/2013   LEFT HEART CATH AND CORONARY ANGIOGRAPHY N/A 11/10/2019   Procedure: LEFT HEART CATH AND CORONARY ANGIOGRAPHY;  Surgeon: Bosie Vinie LABOR, MD;  Location: [facility];  Service: Cardiovascular;  Laterality: N/A;   skin removal surgery     removal of extra skin approx 20 lbs   STONE EXTRACTION WITH BASKET Right 03/07/2017   Procedure: STONE EXTRACTION WITH BASKET;  Surgeon: Penne Knee, MD;  Location: [facility];  Service: Urology;  Laterality: Right;   TONSILLECTOMY AND ADENOIDECTOMY  1960   TOTAL HIP ARTHROPLASTY Right 01/18/2017   Procedure: TOTAL HIP ARTHROPLASTY ANTERIOR APPROACH;  Surgeon: Kathlynn Sharper, MD;  Location: [facility];  Service: Orthopedics;  Laterality: Right;    Home Medications:  Allergies as of 06/11/2024       Reactions   Atorvastatin Other (See Comments)   Muscle aches. Other reaction(s): Other (See Comments) Muscle aches. Muscle pain   Gabapentin  Rash   Pregabalin Other (See Comments)   Muscle  soreness Other reaction(s): Other (See Comments) Muscle soreness Severe muscle pain        Medication List        Accurate as of June 11, 2024  8:43 AM. If you have any questions, ask your nurse or doctor.          aspirin  EC 81 MG tablet Take 81 mg by mouth daily.   clopidogrel  75 MG tablet Commonly known as: PLAVIX  Take 75 mg by mouth daily.   ferrous sulfate  325 (65 FE) MG EC tablet Take 325 mg by mouth daily with breakfast.   fluticasone  50 MCG/ACT nasal spray Commonly known as: FLONASE  Place 2 sprays into both nostrils daily.   latanoprost  0.005 % ophthalmic solution Commonly known as: XALATAN  Place 1 drop into both eyes at bedtime.   meclizine  12.5 MG tablet Commonly known as: ANTIVERT  Take 1 tablet (12.5 mg total) by mouth 3 (three) times daily as needed for dizziness.   nitroGLYCERIN  0.4 MG SL  tablet Commonly known as: NITROSTAT  Place 0.4 mg under the tongue every 5 (five) minutes as needed for chest pain.   oxyCODONE -acetaminophen  5-325 MG tablet Commonly known as: Percocet Take 1 tablet by mouth every 4 (four) hours as needed.   potassium chloride  SA 20 MEQ tablet Commonly known as: KLOR-CON  M Take 20 mEq by mouth daily.   rosuvastatin  40 MG tablet Commonly known as: CRESTOR  Take 1 tablet by mouth daily.   sertraline  100 MG tablet Commonly known as: ZOLOFT  Take 1 tablet (100 mg total) by mouth daily.   tamsulosin  0.4 MG Caps capsule Commonly known as: Flomax  Take 1 capsule (0.4 mg total) by mouth daily. What changed: Another medication with the same name was added. Make sure you understand how and when to take each.   tamsulosin  0.4 MG Caps capsule Commonly known as: FLOMAX  Take 1 capsule (0.4 mg total) by mouth daily. What changed: You were already taking a medication with the same name, and this prescription was added. Make sure you understand how and when to take each.   Vitamin D  50 MCG (2000 UT) Caps Take 2,000 Units by mouth daily.        Allergies:  Allergies  Allergen Reactions   Atorvastatin Other (See Comments)    Muscle aches. Other reaction(s): Other (See Comments) Muscle aches. Muscle pain   Gabapentin  Rash   Pregabalin Other (See Comments)    Muscle soreness Other reaction(s): Other (See Comments) Muscle soreness Severe muscle pain    Family History: Family History  Problem Relation Age of Onset   Heart attack Mother    Brain cancer Father    Heart attack Sister    Congenital heart disease Sister    Leukemia Paternal Grandmother    COPD Brother    Heart disease Brother    Prostate cancer Neg Hx    Kidney cancer Neg Hx    Bladder Cancer Neg Hx     Social History:  reports that he has never smoked. He has never been exposed to tobacco smoke. He has never used smokeless tobacco. He reports that he does not drink alcohol and  does not use drugs.  ROS: Pertinent ROS in HPI  Physical Exam: BP 126/80   Pulse 67   Wt 235 lb (106.6 kg)   SpO2 93%   BMI 36.26 kg/m   Constitutional:  Well nourished. Alert and oriented, No acute distress. HEENT: [place] AT, moist mucus membranes.  Trachea midline Cardiovascular: No clubbing, cyanosis, or edema. Respiratory:  Normal respiratory effort, no increased work of breathing. Neurologic: Grossly intact, no focal deficits, moving all 4 extremities. Psychiatric: Normal mood and affect.   Laboratory Data: See EPIC and HPI  I have reviewed the labs.   Pertinent Imaging: Narrative & Impression  EXAM: CT ABDOMEN AND PELVIS WITHOUT CONTRAST 06/09/2024 07:13:30 PM   TECHNIQUE: CT of the abdomen and pelvis was performed without the administration of intravenous contrast. Multiplanar reformatted images are provided for review. Automated exposure control, iterative reconstruction, and/or weight-based adjustment of the mA/kV was utilized to reduce the radiation dose to as low as reasonably achievable.   COMPARISON: 06/08/2022   CLINICAL HISTORY: Abdominal/flank pain, stone suspected.   FINDINGS:   LOWER CHEST: Dense multivessel coronary atherosclerosis. Elevation of the right hemidiaphragm with right middle and right lower lobe compressive atelectasis.   LIVER: Unchanged left hepatic lobe cyst.   GALLBLADDER AND BILE DUCTS: Fluid filled and distended gallbladder without radiopaque stones or wall thickening. No biliary ductal dilatation.   SPLEEN: No acute abnormality.   PANCREAS: Mild diffuse pancreatic parenchymal fatty atrophy.   ADRENAL GLANDS: No acute abnormality.   KIDNEYS, URETERS AND BLADDER: Unchanged right renal cysts. Multiple small nonobstructive right renal calyceal calculi. Small nonobstructive calyceal calculi also present throughout the left kidney. However, there is moderate left sided hydroureteronephrosis due to a distal left ureteral  calculus, measuring 7 x 5 x 4 mm. The urinary bladder is completely decompressed. No perinephric or periureteral stranding. No prostatomegaly or free pelvic fluid.   GI AND BOWEL: Sleeve gastrectomy changes. Small bowel anastomosis in the midline abdomen. Decompressed gas filled normal appendix. There is no bowel obstruction. Scattered colonic diverticulosis. No changes of acute diverticulitis. Small hiatal hernia. PERITONEUM AND RETROPERITONEUM: No ascites. No free air.   VASCULATURE: Aorta is normal in caliber. Diffuse aortoiliac atherosclerosis.   LYMPH NODES: No lymphadenopathy.   REPRODUCTIVE ORGANS: No acute abnormality.   BONES AND SOFT TISSUES: Diffuse osteopenia. Right hip arthroplasty is anatomically aligned without dislocation. Multilevel degenerative disc disease throughout the THORACOLUMBAR spine. THORACIC DISH. No focal soft tissue abnormality.   IMPRESSION: 1. Moderate left sided hydroureteronephrosis due to an obstructive distal left ureteral calculus, measuring 7 x 5 x 4 mm. 2. Multiple small nonobstructive right renal calyceal calculi and small nonobstructive calyceal calculi throughout the left kidney.   Electronically signed by: Rogelia Myers MD 06/09/2024 08:17 PM EST RP Workstation: [id number]   SD < 1098,  SSD 11 cm   KUB two left ureteral stons I have independently reviewed the films.  Radiologist interpretation still pending.  Assessment & Plan:    1. Left ureteral stone - We discussed various treatment options for urolithiasis including observation with or without medical expulsive therapy, shockwave lithotripsy (SWL), ureteroscopy and laser lithotripsy with stent placement and given stone size, stone location and stone density, explained that ESWL has a lower stone-free rate in a single procedure, but also a lower complication rate compared to ureteroscopy, and avoids a stent and associated stent related symptoms. Possible complications include  renal hematoma, steinstrasse, and the need for additional treatment, explained that ureteroscopy with laser lithotripsy and stent placement has a higher stone-free rate than SWL in a single procedure; however, there is an increased complication rate, including possible infection, ureteral injury, bleeding, and stent-related morbidity. Common stent-related symptoms include dysuria, urgency/frequency, and flank pain. -He would like to continue medical expulsive therapy, so I refilled his tamsulosin  prescription and he has a prescription for Percocet on hand -We reviewed return to clinic  triggers, such as fever, gross hematuria, inability to pass urine, nausea/vomiting or intractable pain   2.  Bilateral nephrolithiasis - seen on recent CT   Return in about 2 weeks (around 06/25/2024) for KUB and symptom recheck .  These notes generated with voice recognition software. I apologize for typographical errors.  William Armstrong  [facility] [address]  [address] [place], [place] [address] [phone]  "

## 2024-06-11 ENCOUNTER — Encounter: Payer: Self-pay | Admitting: Urology

## 2024-06-11 ENCOUNTER — Ambulatory Visit: Admitting: Urology

## 2024-06-11 VITALS — BP 126/80 | HR 67 | Wt 235.0 lb

## 2024-06-11 DIAGNOSIS — N200 Calculus of kidney: Secondary | ICD-10-CM | POA: Diagnosis not present

## 2024-06-11 DIAGNOSIS — N201 Calculus of ureter: Secondary | ICD-10-CM | POA: Diagnosis not present

## 2024-06-11 LAB — URINALYSIS, COMPLETE
Bilirubin, UA: NEGATIVE
Glucose, UA: NEGATIVE
Ketones, UA: NEGATIVE
Leukocytes,UA: NEGATIVE
Nitrite, UA: NEGATIVE
Protein,UA: NEGATIVE
Specific Gravity, UA: 1.025 (ref 1.005–1.030)
Urobilinogen, Ur: 0.2 mg/dL (ref 0.2–1.0)
pH, UA: 5.5 (ref 5.0–7.5)

## 2024-06-11 LAB — MICROSCOPIC EXAMINATION: RBC, Urine: >30 /HPF — AB (ref 0–2)

## 2024-06-11 MED ORDER — TAMSULOSIN HCL 0.4 MG PO CAPS: 0.4000 mg | ORAL_CAPSULE | Freq: Every day | ORAL | 3 refills | Status: AC

## 2024-06-16 ENCOUNTER — Telehealth: Payer: Self-pay

## 2024-06-16 NOTE — Telephone Encounter (Signed)
 Left vm for patient to call back in regards to making sure that he comes for his next appointment, Per provider, even though patient passed kidney stone she wanted to make sure patient still comes to his f/u visit for an xray to confirm stone passing.  Andrea Kirks LPN

## 2024-06-23 ENCOUNTER — Ambulatory Visit
Admission: RE | Admit: 2024-06-23 | Discharge: 2024-06-23 | Disposition: A | Source: Ambulatory Visit | Attending: Urology | Admitting: Urology

## 2024-06-23 ENCOUNTER — Ambulatory Visit
Admission: RE | Admit: 2024-06-23 | Discharge: 2024-06-23 | Disposition: A | Discharge status: Home / Self Care | Source: Ambulatory Visit | Attending: Urology | Admitting: Urology

## 2024-06-23 DIAGNOSIS — N201 Calculus of ureter: Secondary | ICD-10-CM

## 2024-06-23 NOTE — Progress Notes (Signed)
 "    06/24/2024 4:38 PM   William Armstrong [birth year] [id number]  Referring provider: Rudolpho Norleen BIRCH, MD [address] [facility] [place],  [place] [address]  Urological history: 1.  Nephrolithiasis - Stone composition of calcium  oxalate - CT renal stone study (2024) -1.5 right renal pelvic stone and nonobstructing bilateral intrarenal calculi - right URS (2024, 2018) - left ESWL  - left URS (2018)   2. Renal cyst  - RUS (2024) -multiple bilateral renal cysts  3. BPH with LU TS - PSA (08/2023) 0.43  Chief Complaint  Patient presents with   Follow-up   Nephrolithiasis   HPI: William Armstrong is a 74 y.o. male who presents today for kidney stone.    Previous records reviewed.   CT renal stone study (06/2024) noted a 7 mm distal left ureteral calculus with hydroureteronephrosis along with multiple small nonobstructing right renal calculi and left renal calculi.  Serum creatinine 0.88, CBC normal, and UA with micro heme.  Follow up KUB on the 14th noted two stones in the distal left ureter vs one 7 mm stone.    Today, he brings in the stones he has passed.  Patient denies any modifying or aggravating factors.  Patient denies any recent UTI's, gross hematuria, dysuria or suprapubic/flank pain.  Patient denies any fevers, chills, nausea or vomiting.    KUB today, the left distal stones are no longer visible.    UA yellow clear, specific gravity 1.025, pH 5.0, 0-5 WBC's, 0-2 RBC's, 0-10 epithelial cells.   PMH: Past Medical History:  Diagnosis Date   Anemia    Angina pectoris    Aortic stenosis 02/14/2021   a.) TTE 02/14/2021: trivial AS (MPG 10.6)   Arthritis    BCC (basal cell carcinoma of skin)    BPH (benign prostatic hyperplasia)    Bradycardia    CAFL (chronic airflow limitation) (HCC)    CHF (congestive heart failure) (HCC)    a.) TTE 02/26/2012: EF 50%, mod LVH, post HHK, RVE, triv AR/MR, mild TR/PR, PASP 44; b.) TTE 10/11/2012: EF >55%, interm  paradoxical sep motion, PASP 45; c.) TTE 03/18/2015: EF 40%, inf/post HK, mild panval regurg, PASP 40-45, G1DD; d.) TTE 02/14/2021: EF 50%, LVH, inf/inferosep HK, mod BAE, mod RVE, mild AR/TR, mod MR/PR, triv AS (MPG 10.6)   COPD (chronic obstructive pulmonary disease) (HCC)    Coronary artery disease    a.) LHC/PCI 02/26/2012: 99% mRCA (3.5 x 15 mm and 2.5 x 12 mm Integrity BMS); b.) LHC/PCI 10/11/2021: 99% oRCA, 80% ISR pRCA --> faint L-R collateral formation --> DES x 2 (unknown type) placed   Depression    Glaucoma    H/O ventricular fibrillation 02/26/2012   a.) in setting of STEMI --> required defibrillation (360 J) x 3 + amiodarone gtt   History of kidney stones    HLD (hyperlipidemia)    HTN (hypertension)    Hypertension    Hypokalemia    Ischemic cardiomyopathy    a.) TTE 02/26/2012: EF 50%; b.) TTE 10/11/2012: EF >55%; c.) TTE 03/18/2015: EF 40%; d.) TTE 02/14/2021: EF 50%   Long term current use of antithrombotics/antiplatelets    a.) DAPT (ASA + clopidogrel )   Morbid obesity (HCC)    NSTEMI (non-ST elevated myocardial infarction) (HCC) 11/09/2019   a.) LHC/PCI 11/10/2019: 5% mRCA, 99% ISR pRCA-1 (3.5 x 22 mm Resolute Onxy DES), 15% pRCA-2, 50% oRCA, 100% RPDA.   OSA (obstructive sleep apnea)    a.) no longer  requires nocturnal s/p ~250 lb weight loss following bariatric surgery   Pulmonary HTN (HCC) 02/26/2012   a.) TTE 02/26/2012: PASP 44; b.) TTE 10/11/2012: PASP 45; c.) TTE 03/18/2015: PASP 40-45   RBBB (right bundle branch block)    STEMI (ST elevation myocardial infarction) (HCC) 02/26/2012   a.) LHC/PCI 02/26/2012 --> 99% mRCA (3.5 x 15 mm and 3.5 x 12 mm Integrity BMS); b.) developed AVIR on catheterization table --> decompsensated to VT requiring defibrillation x 3, which achieved ROSC. Treated with amiodarone gtt.    Surgical History: Past Surgical History:  Procedure Laterality Date   CARDIAC CATHETERIZATION     CORONARY ANGIOPLASTY     CORONARY STENT  INTERVENTION N/A 11/10/2019   Procedure: CORONARY STENT INTERVENTION;  Surgeon: Ammon Blunt, MD;  Location: [facility];  Service: Cardiovascular;  Laterality: N/A;   CORONARY STENT PLACEMENT  02/2012   CYSTOSCOPY W/ RETROGRADES Left 11/18/2016   Procedure: CYSTOSCOPY WITH RETROGRADE PYELOGRAM;  Surgeon: Watt Rush, MD;  Location: [facility];  Service: Urology;  Laterality: Left;   CYSTOSCOPY WITH STENT PLACEMENT Left 11/18/2016   Procedure: CYSTOSCOPY WITH STENT PLACEMENT;  Surgeon: Watt Rush, MD;  Location: [facility];  Service: Urology;  Laterality: Left;   CYSTOSCOPY/URETEROSCOPY/HOLMIUM LASER/STENT PLACEMENT Left 11/29/2016   Procedure: CYSTOSCOPY/URETEROSCOPY/HOLMIUM LASER/STENT EXCHANGE;  Surgeon: Penne Knee, MD;  Location: [facility];  Service: Urology;  Laterality: Left;   CYSTOSCOPY/URETEROSCOPY/HOLMIUM LASER/STENT PLACEMENT Right 03/07/2017   Procedure: CYSTOSCOPY/URETEROSCOPY/HOLMIUM LASER/STENT PLACEMENT;  Surgeon: Penne Knee, MD;  Location: [facility];  Service: Urology;  Laterality: Right;   CYSTOSCOPY/URETEROSCOPY/HOLMIUM LASER/STENT PLACEMENT Right 08/07/2022   Procedure: CYSTOSCOPY/URETEROSCOPY/HOLMIUM LASER/STENT PLACEMENT;  Surgeon: Penne Knee, MD;  Location: [facility];  Service: Urology;  Laterality: Right;   EXTRACORPOREAL SHOCK WAVE LITHOTRIPSY Left 02/03/2021   Procedure: EXTRACORPOREAL SHOCK WAVE LITHOTRIPSY (ESWL);  Surgeon: Penne Knee, MD;  Location: [facility];  Service: Urology;  Laterality: Left;   EXTRACORPOREAL SHOCK WAVE LITHOTRIPSY Right 06/22/2022   Procedure: EXTRACORPOREAL SHOCK WAVE LITHOTRIPSY (ESWL);  Surgeon: Twylla Glendia BROCKS, MD;  Location: [facility];  Service: Urology;  Laterality: Right;   JOINT REPLACEMENT     right hip   LAPAROSCOPIC GASTRIC RESTRICTIVE DUODENAL PROCEDURE (DUODENAL SWITCH)     w/hiatal hernia repair   LAPAROSCOPIC GASTRIC SLEEVE RESECTION  08/21/2013   LEFT HEART CATH AND CORONARY ANGIOGRAPHY N/A  11/10/2019   Procedure: LEFT HEART CATH AND CORONARY ANGIOGRAPHY;  Surgeon: Bosie Vinie LABOR, MD;  Location: [facility];  Service: Cardiovascular;  Laterality: N/A;   skin removal surgery     removal of extra skin approx 20 lbs   STONE EXTRACTION WITH BASKET Right 03/07/2017   Procedure: STONE EXTRACTION WITH BASKET;  Surgeon: Penne Knee, MD;  Location: [facility];  Service: Urology;  Laterality: Right;   TONSILLECTOMY AND ADENOIDECTOMY  1960   TOTAL HIP ARTHROPLASTY Right 01/18/2017   Procedure: TOTAL HIP ARTHROPLASTY ANTERIOR APPROACH;  Surgeon: Kathlynn Sharper, MD;  Location: [facility];  Service: Orthopedics;  Laterality: Right;    Home Medications:  Allergies as of 06/24/2024       Reactions   Atorvastatin Other (See Comments)   Muscle aches. Other reaction(s): Other (See Comments) Muscle aches. Muscle pain   Gabapentin  Rash   Pregabalin Other (See Comments)   Muscle soreness Other reaction(s): Other (See Comments) Muscle soreness Severe muscle pain        Medication List        Accurate as of June 24, 2024 11:59 PM. If you have  any questions, ask your nurse or doctor.          aspirin  EC 81 MG tablet Take 81 mg by mouth daily.   clopidogrel  75 MG tablet Commonly known as: PLAVIX  Take 75 mg by mouth daily.   ferrous sulfate  325 (65 FE) MG EC tablet Take 325 mg by mouth daily with breakfast.   fluticasone  50 MCG/ACT nasal spray Commonly known as: FLONASE  Place 2 sprays into both nostrils daily.   latanoprost  0.005 % ophthalmic solution Commonly known as: XALATAN  Place 1 drop into both eyes at bedtime.   meclizine  12.5 MG tablet Commonly known as: ANTIVERT  Take 1 tablet (12.5 mg total) by mouth 3 (three) times daily as needed for dizziness.   nitroGLYCERIN  0.4 MG SL tablet Commonly known as: NITROSTAT  Place 0.4 mg under the tongue every 5 (five) minutes as needed for chest pain.   oxyCODONE -acetaminophen  5-325 MG tablet Commonly known  as: Percocet Take 1 tablet by mouth every 4 (four) hours as needed.   potassium chloride  SA 20 MEQ tablet Commonly known as: KLOR-CON  M Take 20 mEq by mouth daily.   ranolazine 500 MG 12 hr tablet Commonly known as: RANEXA Take 500 mg by mouth 2 (two) times daily.   rosuvastatin  40 MG tablet Commonly known as: CRESTOR  Take 1 tablet by mouth daily.   sertraline  100 MG tablet Commonly known as: ZOLOFT  Take 1 tablet (100 mg total) by mouth daily.   tamsulosin  0.4 MG Caps capsule Commonly known as: Flomax  Take 1 capsule (0.4 mg total) by mouth daily.   tamsulosin  0.4 MG Caps capsule Commonly known as: FLOMAX  Take 1 capsule (0.4 mg total) by mouth daily.   Vitamin D  50 MCG (2000 UT) Caps Take 2,000 Units by mouth daily.        Allergies:  Allergies  Allergen Reactions   Atorvastatin Other (See Comments)    Muscle aches. Other reaction(s): Other (See Comments) Muscle aches. Muscle pain   Gabapentin  Rash   Pregabalin Other (See Comments)    Muscle soreness Other reaction(s): Other (See Comments) Muscle soreness Severe muscle pain    Family History: Family History  Problem Relation Age of Onset   Heart attack Mother    Brain cancer Father    Heart attack Sister    Congenital heart disease Sister    Leukemia Paternal Grandmother    COPD Brother    Heart disease Brother    Prostate cancer Neg Hx    Kidney cancer Neg Hx    Bladder Cancer Neg Hx     Social History:  reports that he has never smoked. He has never been exposed to tobacco smoke. He has never used smokeless tobacco. He reports that he does not drink alcohol and does not use drugs.  ROS: Pertinent ROS in HPI  Physical Exam: BP 118/74   Pulse 64   Wt 230 lb (104.3 kg)   SpO2 95%   BMI 35.49 kg/m   Constitutional:  Well nourished. Alert and oriented, No acute distress. HEENT: [place] AT, moist mucus membranes.  Trachea midline Cardiovascular: No clubbing, cyanosis, or edema. Respiratory: Normal  respiratory effort, no increased work of breathing. Neurologic: Grossly intact, no focal deficits, moving all 4 extremities. Psychiatric: Normal mood and affect.   Laboratory Data: See EPIC and HPI  I have reviewed the labs.   Pertinent Imaging: KUB, left ureteral stones no longer visible  I have independently reviewed the films.  Radiologist interpretation still pending.  Assessment & Plan:  1. Left ureteral stone - spontaneous passage of the left distal stones - UA clear  2.  Bilateral nephrolithiasis - seen on recent CT   Return in about 1 year (around 06/24/2025) for KUB, UA.  These notes generated with voice recognition software. I apologize for typographical errors.  CLOTILDA HELON RIGGERS  [facility] [address]  [address] [place], [place] [address] [phone]  "

## 2024-06-24 ENCOUNTER — Ambulatory Visit: Admitting: Urology

## 2024-06-24 VITALS — BP 118/74 | HR 64 | Wt 230.0 lb

## 2024-06-24 DIAGNOSIS — N201 Calculus of ureter: Secondary | ICD-10-CM

## 2024-06-24 DIAGNOSIS — N200 Calculus of kidney: Secondary | ICD-10-CM

## 2024-06-24 LAB — URINALYSIS, COMPLETE
Bilirubin, UA: NEGATIVE
Glucose, UA: NEGATIVE
Ketones, UA: NEGATIVE
Leukocytes,UA: NEGATIVE
Nitrite, UA: NEGATIVE
Protein,UA: NEGATIVE
RBC, UA: NEGATIVE
Specific Gravity, UA: 1.025 (ref 1.005–1.030)
Urobilinogen, Ur: 0.2 mg/dL (ref 0.2–1.0)
pH, UA: 5 (ref 5.0–7.5)

## 2024-06-24 LAB — MICROSCOPIC EXAMINATION: Bacteria, UA: NONE SEEN

## 2024-06-28 ENCOUNTER — Encounter: Payer: Self-pay | Admitting: Urology

## 2025-06-24 ENCOUNTER — Ambulatory Visit: Admitting: Urology
